# Patient Record
Sex: Female | Born: 1970 | Race: Black or African American | Hispanic: No | Smoking: Never smoker
Health system: Southern US, Community
[De-identification: ages and names within clinical notes are randomized; demographics above are authoritative.]

## PROBLEM LIST (undated history)

## (undated) DIAGNOSIS — R079 Chest pain, unspecified: Secondary | ICD-10-CM

## (undated) DIAGNOSIS — Z1507 Genetic susceptibility to malignant neoplasm of urinary tract: Secondary | ICD-10-CM

## (undated) DIAGNOSIS — M269 Dentofacial anomaly, unspecified: Secondary | ICD-10-CM

## (undated) DIAGNOSIS — Z9889 Other specified postprocedural states: Secondary | ICD-10-CM

## (undated) DIAGNOSIS — Z1509 Genetic susceptibility to other malignant neoplasm: Secondary | ICD-10-CM

## (undated) DIAGNOSIS — K219 Gastro-esophageal reflux disease without esophagitis: Secondary | ICD-10-CM

## (undated) DIAGNOSIS — K8020 Calculus of gallbladder without cholecystitis without obstruction: Secondary | ICD-10-CM

## (undated) DIAGNOSIS — R519 Headache, unspecified: Secondary | ICD-10-CM

## (undated) DIAGNOSIS — K648 Other hemorrhoids: Secondary | ICD-10-CM

## (undated) DIAGNOSIS — D332 Benign neoplasm of brain, unspecified: Secondary | ICD-10-CM

## (undated) DIAGNOSIS — E213 Hyperparathyroidism, unspecified: Secondary | ICD-10-CM

## (undated) DIAGNOSIS — Z5189 Encounter for other specified aftercare: Secondary | ICD-10-CM

## (undated) DIAGNOSIS — Z8489 Family history of other specified conditions: Secondary | ICD-10-CM

## (undated) DIAGNOSIS — E785 Hyperlipidemia, unspecified: Secondary | ICD-10-CM

## (undated) DIAGNOSIS — C801 Malignant (primary) neoplasm, unspecified: Secondary | ICD-10-CM

## (undated) DIAGNOSIS — R112 Nausea with vomiting, unspecified: Secondary | ICD-10-CM

## (undated) HISTORY — DX: Family history of other specified conditions: Z84.89

## (undated) HISTORY — DX: Chest pain, unspecified: R07.9

## (undated) HISTORY — DX: Calculus of gallbladder without cholecystitis without obstruction: K80.20

## (undated) HISTORY — DX: Hyperlipidemia, unspecified: E78.5

## (undated) HISTORY — DX: Encounter for other specified aftercare: Z51.89

## (undated) HISTORY — PX: BRAIN SURGERY: SHX531

## (undated) HISTORY — DX: Other hemorrhoids: K64.8

## (undated) HISTORY — PX: CHOLECYSTECTOMY: SHX55

## (undated) HISTORY — DX: Gastro-esophageal reflux disease without esophagitis: K21.9

---
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
## 1997-07-31 ENCOUNTER — Ambulatory Visit (HOSPITAL_COMMUNITY): Admission: RE | Admit: 1997-07-31 | Discharge: 1997-07-31 | Payer: Self-pay | Admitting: Obstetrics and Gynecology

## 1997-12-15 ENCOUNTER — Other Ambulatory Visit: Admission: RE | Admit: 1997-12-15 | Discharge: 1997-12-15 | Payer: Self-pay | Admitting: Family Medicine

## 1998-03-19 ENCOUNTER — Other Ambulatory Visit: Admission: RE | Admit: 1998-03-19 | Discharge: 1998-03-19 | Payer: Self-pay | Admitting: Family Medicine

## 1998-05-10 ENCOUNTER — Other Ambulatory Visit: Admission: RE | Admit: 1998-05-10 | Discharge: 1998-05-10 | Payer: Self-pay | Admitting: Obstetrics and Gynecology

## 1999-07-15 ENCOUNTER — Other Ambulatory Visit: Admission: RE | Admit: 1999-07-15 | Discharge: 1999-07-15 | Payer: Self-pay | Admitting: Family Medicine

## 2000-06-20 ENCOUNTER — Other Ambulatory Visit: Admission: RE | Admit: 2000-06-20 | Discharge: 2000-06-20 | Payer: Self-pay | Admitting: Family Medicine

## 2001-01-17 ENCOUNTER — Other Ambulatory Visit: Admission: RE | Admit: 2001-01-17 | Discharge: 2001-01-17 | Payer: Self-pay | Admitting: Family Medicine

## 2002-02-05 ENCOUNTER — Other Ambulatory Visit: Admission: RE | Admit: 2002-02-05 | Discharge: 2002-02-05 | Payer: Self-pay | Admitting: Unknown Physician Specialty

## 2002-02-05 ENCOUNTER — Other Ambulatory Visit: Admission: RE | Admit: 2002-02-05 | Discharge: 2002-02-05 | Payer: Self-pay | Admitting: Family Medicine

## 2003-02-24 ENCOUNTER — Other Ambulatory Visit: Admission: RE | Admit: 2003-02-24 | Discharge: 2003-02-24 | Payer: Self-pay | Admitting: Family Medicine

## 2003-02-24 ENCOUNTER — Other Ambulatory Visit: Admission: RE | Admit: 2003-02-24 | Discharge: 2003-02-24 | Payer: Self-pay | Admitting: *Deleted

## 2003-12-22 ENCOUNTER — Encounter: Admission: RE | Admit: 2003-12-22 | Discharge: 2004-03-21 | Payer: Self-pay | Admitting: Family Medicine

## 2004-03-11 ENCOUNTER — Other Ambulatory Visit: Admission: RE | Admit: 2004-03-11 | Discharge: 2004-03-11 | Payer: Self-pay | Admitting: Family Medicine

## 2005-03-14 ENCOUNTER — Other Ambulatory Visit: Admission: RE | Admit: 2005-03-14 | Discharge: 2005-03-14 | Payer: Self-pay | Admitting: Family Medicine

## 2006-03-16 ENCOUNTER — Other Ambulatory Visit: Admission: RE | Admit: 2006-03-16 | Discharge: 2006-03-16 | Payer: Self-pay | Admitting: Family Medicine

## 2006-08-23 ENCOUNTER — Ambulatory Visit: Payer: Self-pay | Admitting: Cardiovascular Disease

## 2006-08-24 ENCOUNTER — Ambulatory Visit: Payer: Self-pay | Admitting: Cardiology

## 2006-09-03 ENCOUNTER — Ambulatory Visit: Payer: Self-pay

## 2006-09-03 LAB — CONVERTED CEMR LAB
Basophils Relative: 0.7 % (ref 0.0–1.0)
HCT: 41.2 % (ref 36.0–46.0)
Hemoglobin: 14.3 g/dL (ref 12.0–15.0)
Lymphocytes Relative: 29.4 % (ref 12.0–46.0)
MCHC: 34.7 g/dL (ref 30.0–36.0)
Monocytes Absolute: 0.2 10*3/uL (ref 0.2–0.7)
Monocytes Relative: 5.2 % (ref 3.0–11.0)
Neutro Abs: 3.1 10*3/uL (ref 1.4–7.7)
Neutrophils Relative %: 63.1 % (ref 43.0–77.0)
RDW: 11.9 % (ref 11.5–14.6)

## 2006-09-18 ENCOUNTER — Ambulatory Visit: Payer: Self-pay | Admitting: Cardiovascular Disease

## 2006-10-22 ENCOUNTER — Ambulatory Visit (HOSPITAL_COMMUNITY): Admission: RE | Admit: 2006-10-22 | Discharge: 2006-10-22 | Payer: Self-pay | Admitting: Family Medicine

## 2012-01-18 ENCOUNTER — Other Ambulatory Visit: Payer: Self-pay | Admitting: Family Medicine

## 2012-01-18 DIAGNOSIS — R319 Hematuria, unspecified: Secondary | ICD-10-CM

## 2012-01-18 DIAGNOSIS — R109 Unspecified abdominal pain: Secondary | ICD-10-CM

## 2012-01-18 DIAGNOSIS — R102 Pelvic and perineal pain: Secondary | ICD-10-CM

## 2012-01-19 ENCOUNTER — Ambulatory Visit (HOSPITAL_COMMUNITY)
Admission: RE | Admit: 2012-01-19 | Discharge: 2012-01-19 | Disposition: A | Discharge status: Home / Self Care | Payer: BC Managed Care – PPO | Source: Ambulatory Visit | Attending: Family Medicine | Admitting: Family Medicine

## 2012-01-19 DIAGNOSIS — R102 Pelvic and perineal pain: Secondary | ICD-10-CM

## 2012-01-19 DIAGNOSIS — R109 Unspecified abdominal pain: Secondary | ICD-10-CM | POA: Insufficient documentation

## 2012-01-19 DIAGNOSIS — N949 Unspecified condition associated with female genital organs and menstrual cycle: Secondary | ICD-10-CM | POA: Insufficient documentation

## 2012-01-19 DIAGNOSIS — R319 Hematuria, unspecified: Secondary | ICD-10-CM | POA: Insufficient documentation

## 2012-08-30 ENCOUNTER — Other Ambulatory Visit: Payer: Self-pay | Admitting: Nurse Practitioner

## 2012-08-30 LAB — COMPLETE METABOLIC PANEL WITH GFR
Albumin: 4.5 g/dL (ref 3.5–5.2)
BUN: 9 mg/dL (ref 6–23)
CO2: 25 mEq/L (ref 19–32)
Calcium: 10.1 mg/dL (ref 8.4–10.5)
Chloride: 104 mEq/L (ref 96–112)
Creat: 0.68 mg/dL (ref 0.50–1.10)
GFR, Est African American: >89 mL/min
GFR, Est Non African American: >89 mL/min
Glucose, Bld: 99 mg/dL (ref 70–99)
Potassium: 4.3 mEq/L (ref 3.5–5.3)

## 2012-08-31 LAB — THYROID PANEL WITH TSH
T3 Uptake: 38.1 % — ABNORMAL HIGH (ref 22.5–37.0)
T4, Total: 7.9 ug/dL (ref 5.0–12.5)

## 2012-09-03 ENCOUNTER — Telehealth: Payer: Self-pay | Admitting: Family Medicine

## 2012-09-03 LAB — NMR LIPOPROFILE WITH LIPIDS
Cholesterol, Total: 176 mg/dL (ref <200)
HDL Particle Number: 25.2 umol/L — ABNORMAL LOW (ref >=30.5)
LDL Particle Number: 1598 nmol/L — ABNORMAL HIGH (ref <1000)
Large HDL-P: <1.3 umol/L — ABNORMAL LOW (ref >=4.8)
Large VLDL-P: 1.2 nmol/L (ref <=2.7)
Triglycerides: 68 mg/dL (ref <150)
VLDL Size: 43.5 nm (ref >=46.6)

## 2012-09-03 LAB — PAP, THIN PREP W/HPV RFLX HPV TYPE 16/18

## 2012-09-03 NOTE — Telephone Encounter (Signed)
Pt aware normal

## 2012-10-02 ENCOUNTER — Telehealth: Payer: Self-pay | Admitting: Nurse Practitioner

## 2012-10-02 NOTE — Telephone Encounter (Signed)
APPT GIVEN FOR THURS WITH MMM

## 2012-10-03 ENCOUNTER — Ambulatory Visit (INDEPENDENT_AMBULATORY_CARE_PROVIDER_SITE_OTHER): Payer: BC Managed Care – PPO | Admitting: Nurse Practitioner

## 2012-10-03 ENCOUNTER — Encounter: Payer: Self-pay | Admitting: Nurse Practitioner

## 2012-10-03 ENCOUNTER — Ambulatory Visit (INDEPENDENT_AMBULATORY_CARE_PROVIDER_SITE_OTHER): Payer: BC Managed Care – PPO

## 2012-10-03 VITALS — BP 126/70 | HR 81 | Temp 97.2°F | Ht 65.0 in | Wt 182.0 lb

## 2012-10-03 DIAGNOSIS — S4991XA Unspecified injury of right shoulder and upper arm, initial encounter: Secondary | ICD-10-CM

## 2012-10-03 DIAGNOSIS — S4980XA Other specified injuries of shoulder and upper arm, unspecified arm, initial encounter: Secondary | ICD-10-CM

## 2012-10-03 MED ORDER — TRIAMCINOLONE ACETONIDE 40 MG/ML IJ SUSP
40.0000 mg | Freq: Once | INTRAMUSCULAR | Status: AC
  Administered 2012-10-03: 40 mg via INTRAMUSCULAR

## 2012-10-03 MED ORDER — BUPIVACAINE HCL 0.5 % IJ SOLN
50.0000 mL | Freq: Once | INTRAMUSCULAR | Status: AC
  Administered 2012-10-03: 50 mL

## 2012-10-03 NOTE — Progress Notes (Signed)
  Subjective:    Patient ID: Laurie Kelley, female    DOB: 1970/08/20, 42 y.o.   MRN: [id number]  HPI-Patient in complaining of pain Right shoulder pain. Started . constant. Rates pain 8/10, worse when laying down. Nothing Helps pain. Over use increases pain. Associated symptoms include nothing     Review of Systems  Constitutional: Negative.   HENT: Negative.   Eyes: Negative.   Respiratory: Negative.   Cardiovascular: Negative.        Objective:   Physical Exam  Constitutional: She appears well-developed and well-nourished.  Cardiovascular: Normal rate, normal heart sounds and intact distal pulses.   Pulmonary/Chest: Effort normal and breath sounds normal.  Musculoskeletal:  Decrease ROM of Right SHoulder, with pain on abduction and internal rotation. Motor strength and sensation intact distally.   Xray- No acute findings  Preliminary reading by Paulene Floor, FNP  [facility] Procedure: Marcaine0.5% 1ml with Kenolog 40mg /ml 1ml injected in right shoulder under sterile technique.      Assessment & Plan:  Right shoulder pain  Rest  Ice if helps  Try to rest  Mary-Margaret Daphine Deutscher, FNP

## 2012-10-03 NOTE — Patient Instructions (Signed)
Shoulder Pain The shoulder is the joint that connects your arms to your body. The bones that form the shoulder joint include the upper arm bone (humerus), the shoulder blade (scapula), and the collarbone (clavicle). The top of the humerus is shaped like a ball and fits into a rather flat socket on the scapula (glenoid cavity). A combination of muscles and strong, fibrous tissues that connect muscles to bones (tendons) support your shoulder joint and hold the ball in the socket. Small, fluid-filled sacs (bursae) are located in different areas of the joint. They act as cushions between the bones and the overlying soft tissues and help reduce friction between the gliding tendons and the bone as you move your arm. Your shoulder joint allows a wide range of motion in your arm. This range of motion allows you to do things like scratch your back or throw a ball. However, this range of motion also makes your shoulder more prone to pain from overuse and injury. Causes of shoulder pain can originate from both injury and overuse and usually can be grouped in the following four categories:  Redness, swelling, and pain (inflammation) of the tendon (tendinitis) or the bursae (bursitis).  Instability, such as a dislocation of the joint.  Inflammation of the joint (arthritis).  Broken bone (fracture). HOME CARE INSTRUCTIONS   Apply ice to the sore area.  Put ice in a plastic bag.  Place a towel between your skin and the bag.  Leave the ice on for 15 to 20 minutes, 3 to 4 times per day for the first 2 days.  If you have a shoulder sling or immobilizer, wear it as long as your caregiver instructs. Only remove it to shower or bathe. Move your arm as little as possible, but keep your hand moving to prevent swelling.  Only take over-the-counter or prescription medicines for pain, discomfort, or fever as directed by your caregiver. SEEK MEDICAL CARE IF:   Your shoulder pain increases, or new pain develops in  your arm, hand, or fingers.  Your hand or fingers become cold and numb.  Your pain is not relieved with medicines. SEEK IMMEDIATE MEDICAL CARE IF:   Your arm, hand, or fingers are numb or tingling.  Your arm, hand, or fingers are significantly swollen or turn white or blue. MAKE SURE YOU:   Understand these instructions.  Will watch your condition.  Will get help right away if you are not doing well or get worse. Document Released: 03/15/2005 Document Revised: 08/28/2011 Document Reviewed: 05/20/2011 Exit[employer] Patient Information 2013 [employer].  

## 2012-11-17 ENCOUNTER — Encounter (HOSPITAL_COMMUNITY): Payer: Self-pay

## 2012-11-17 ENCOUNTER — Emergency Department (HOSPITAL_COMMUNITY)
Admission: EM | Admit: 2012-11-17 | Discharge: 2012-11-17 | Disposition: A | Discharge status: Home / Self Care | Payer: BC Managed Care – PPO | Attending: Emergency Medicine | Admitting: Emergency Medicine

## 2012-11-17 DIAGNOSIS — Y9289 Other specified places as the place of occurrence of the external cause: Secondary | ICD-10-CM | POA: Insufficient documentation

## 2012-11-17 DIAGNOSIS — T17208A Unspecified foreign body in pharynx causing other injury, initial encounter: Secondary | ICD-10-CM

## 2012-11-17 DIAGNOSIS — T189XXA Foreign body of alimentary tract, part unspecified, initial encounter: Secondary | ICD-10-CM | POA: Insufficient documentation

## 2012-11-17 DIAGNOSIS — K209 Esophagitis, unspecified without bleeding: Secondary | ICD-10-CM | POA: Insufficient documentation

## 2012-11-17 DIAGNOSIS — IMO0002 Reserved for concepts with insufficient information to code with codable children: Secondary | ICD-10-CM | POA: Insufficient documentation

## 2012-11-17 DIAGNOSIS — Y9389 Activity, other specified: Secondary | ICD-10-CM | POA: Insufficient documentation

## 2012-11-17 DIAGNOSIS — R1310 Dysphagia, unspecified: Secondary | ICD-10-CM | POA: Insufficient documentation

## 2012-11-17 MED ORDER — SUCRALFATE 1 GM/10ML PO SUSP: 1.0000 g | Freq: Four times a day (QID) | ORAL | Status: DC

## 2012-11-17 MED ORDER — GI COCKTAIL ~~LOC~~
30.0000 mL | Freq: Once | ORAL | Status: AC
  Administered 2012-11-17: 30 mL via ORAL
  Filled 2012-11-17: qty 30

## 2012-11-17 NOTE — ED Notes (Signed)
Report received from TO RN 

## 2012-11-17 NOTE — ED Provider Notes (Signed)
History  This chart was scribed for Ward Givens, MD by Bennett Scrape, ED Scribe. This patient was seen in room APA03/APA03 and the patient's care was started at 5:48 PM.  CSN: [id number]  Arrival date & time 11/17/12  1715   First MD Initiated Contact with Patient 11/17/12 1748      Chief Complaint  Patient presents with  . foreign body in throat      The history is provided by the patient. No language interpreter was used.    HPI Comments: Laurie L Forester is a 42 y.o. female who presents to the Emergency Department complaining of a foreign body described as one unisom gel capsule stuck in her throat on the right side. She reports that she felt like the pill got got in a "pocket" when she swallowed it two days ago and has since moved further down.  She admits that she has tried self-induced vomiting with no improvement in the symptoms. She denies having difficulty swallowing food or fluids but states that she feels a significant amount of pressure in her throat constantly. She denies having prior trouble swallowing pills and admits that she has swallowed unisom pills before without difficulty. She denies SOB, CP and nausea as associated symptoms. Pt does not have a h/o chronic medical conditions and denies smoking and alcohol use.  PCP is NP Paulene Floor at [facility].   History reviewed. No pertinent past medical history.  History reviewed. No pertinent past surgical history.  Family History  Problem Relation Age of Onset  . Heart disease Father     History  Substance Use Topics  . Smoking status: Never Smoker   . Smokeless tobacco: Not on file  . Alcohol Use: No  works with [employer]  No OB history provided.  Review of Systems  All other systems reviewed and are negative.    Allergies  Review of patient's allergies indicates no known allergies.  Home Medications   Current Outpatient Rx  Name  Route  Sig  Dispense  Refill  .  medroxyPROGESTERone (DEPO-PROVERA) 150 MG/ML injection                 Triage Vitals: BP 150/75  Pulse 92  Temp(Src) 97.5 F (36.4 C) (Oral)  Resp 18  Ht 5\' 5"  (1.651 m)  Wt 183 lb (83.008 kg)  BMI 30.45 kg/m2  SpO2 100%  Vital signs normal    Physical Exam  Nursing note and vitals reviewed. Constitutional: She is oriented to person, place, and time. She appears well-developed and well-nourished.  Non-toxic appearance. She does not appear ill. No distress.  HENT:  Head: Normocephalic and atraumatic.  Right Ear: External ear normal.  Left Ear: External ear normal.  Nose: Nose normal. No mucosal edema or rhinorrhea.  Mouth/Throat: Oropharynx is clear and moist and mucous membranes are normal. No dental abscesses or edematous.  Eyes: Conjunctivae and EOM are normal. Pupils are equal, round, and reactive to light.  Neck: Normal range of motion and full passive range of motion without pain. Neck supple.    Patient indicates she initially felt like was stuck and shows on her right side of her neck, she relates now she's having pain in the anterior portion of her neck as noted in the drawing. Patient has no drooling, no shortness of breath, no difficulty swallowing. Her voice is normal  Pulmonary/Chest: Effort normal and breath sounds normal. No respiratory distress. She has no rhonchi. She exhibits no crepitus.  Abdominal:  Normal appearance.  Musculoskeletal: Normal range of motion. She exhibits no edema and no tenderness.  Moves all extremities well.   Neurological: She is alert and oriented to person, place, and time. She has normal strength. No cranial nerve deficit.  Skin: Skin is warm, dry and intact. No rash noted. No erythema. No pallor.  Psychiatric: She has a normal mood and affect. Her speech is normal and behavior is normal. Her mood appears not anxious.    ED Course  Procedures (including critical care time)  Medications  gi cocktail (Maalox,Lidocaine,Donnatal)  (30 mLs Oral Given 11/17/12 1901)    DIAGNOSTIC STUDIES: Oxygen Saturation is 100% on room air, normal by my interpretation.    COORDINATION OF CARE: 6:31 PM-Advised pt that the capsule is designed to dissolve but that her symptoms could be tissue irritation from where the pill became stuck. Discussed treatment plan which includes medications with pt at bedside and pt agreed to plan. Will have pt f/u with ENT if symptoms persist.  We discussed followup with the ears nose and throat doctor. She reports her son sees a Dr. Andrey Campanile in [place], she can follow up with Dr. Andrey Campanile or Dr. Seth Bake. She reports the GI cocktail did help her discomfort.   1. Foreign body in throat, initial encounter   2. Pill esophagitis       Discharge Medication List as of 11/17/2012  7:33 PM    START taking these medications   Details  sucralfate (CARAFATE) 1 GM/10ML suspension Take 10 mLs (1 g total) by mouth 4 (four) times daily., Starting 11/17/2012, Until Discontinued, Print        Plan discharge  Devoria Albe, MD, FACEP   MDM     I personally performed the services described in this documentation, which was scribed in my presence. The recorded information has been reviewed and considered.  Devoria Albe, MD, Armando Gang    Ward Givens, MD 11/18/12 [phone]

## 2012-11-17 NOTE — ED Notes (Signed)
Pt reports took a unisom sleep aid pill Friday and it has been stuck in throat since.  Pt says it feel like it is in a "pocket" in her throat.  Pt able to eat and swallow without difficulty but says is painful.

## 2012-11-17 NOTE — ED Notes (Signed)
Pt alert & oriented x4, stable gait. Patient given discharge instructions, paperwork & prescription(s). Patient  instructed to stop at the registration desk to finish any additional paperwork. Patient verbalized understanding. Pt left department w/ no further questions. 

## 2012-12-02 ENCOUNTER — Ambulatory Visit (INDEPENDENT_AMBULATORY_CARE_PROVIDER_SITE_OTHER): Payer: BC Managed Care – PPO | Admitting: *Deleted

## 2012-12-02 DIAGNOSIS — Z309 Encounter for contraceptive management, unspecified: Secondary | ICD-10-CM

## 2012-12-02 DIAGNOSIS — IMO0001 Reserved for inherently not codable concepts without codable children: Secondary | ICD-10-CM

## 2012-12-02 MED ORDER — MEDROXYPROGESTERONE ACETATE 150 MG/ML IM SUSP
150.0000 mg | INTRAMUSCULAR | Status: AC
  Administered 2012-12-02 – 2013-06-02 (×3): 150 mg via INTRAMUSCULAR

## 2012-12-02 NOTE — Patient Instructions (Signed)

## 2012-12-02 NOTE — Progress Notes (Signed)
Patient tolerated well.

## 2013-03-05 ENCOUNTER — Ambulatory Visit (INDEPENDENT_AMBULATORY_CARE_PROVIDER_SITE_OTHER): Payer: BC Managed Care – PPO | Admitting: *Deleted

## 2013-03-05 DIAGNOSIS — IMO0001 Reserved for inherently not codable concepts without codable children: Secondary | ICD-10-CM

## 2013-03-05 DIAGNOSIS — Z309 Encounter for contraceptive management, unspecified: Secondary | ICD-10-CM

## 2013-06-02 ENCOUNTER — Ambulatory Visit (INDEPENDENT_AMBULATORY_CARE_PROVIDER_SITE_OTHER): Payer: BC Managed Care – PPO | Admitting: *Deleted

## 2013-06-02 DIAGNOSIS — Z309 Encounter for contraceptive management, unspecified: Secondary | ICD-10-CM

## 2013-06-02 DIAGNOSIS — IMO0001 Reserved for inherently not codable concepts without codable children: Secondary | ICD-10-CM

## 2013-06-02 NOTE — Progress Notes (Signed)
Patient ID: Laurie Kelley, female   DOB: [birth year], 42 y.o.   MRN: [id number] p tolerated inj well

## 2013-08-20 ENCOUNTER — Other Ambulatory Visit: Payer: Self-pay | Admitting: Nurse Practitioner

## 2013-08-21 NOTE — Telephone Encounter (Signed)
Last seen 10/03/12  MMM

## 2013-08-29 ENCOUNTER — Encounter (INDEPENDENT_AMBULATORY_CARE_PROVIDER_SITE_OTHER): Payer: Self-pay

## 2013-08-29 ENCOUNTER — Ambulatory Visit (INDEPENDENT_AMBULATORY_CARE_PROVIDER_SITE_OTHER): Payer: BC Managed Care – PPO | Admitting: *Deleted

## 2013-08-29 DIAGNOSIS — Z309 Encounter for contraceptive management, unspecified: Secondary | ICD-10-CM

## 2013-08-29 DIAGNOSIS — IMO0001 Reserved for inherently not codable concepts without codable children: Secondary | ICD-10-CM

## 2013-08-29 MED ORDER — MEDROXYPROGESTERONE ACETATE 150 MG/ML IM SUSP
150.0000 mg | INTRAMUSCULAR | Status: AC
  Administered 2013-08-29 – 2014-06-02 (×4): 150 mg via INTRAMUSCULAR

## 2013-08-29 NOTE — Progress Notes (Signed)
DEPOPROVERA GIVEN AND TOLERATED WELL

## 2013-08-29 NOTE — Patient Instructions (Signed)

## 2013-11-18 ENCOUNTER — Ambulatory Visit (INDEPENDENT_AMBULATORY_CARE_PROVIDER_SITE_OTHER): Payer: BC Managed Care – PPO | Admitting: Nurse Practitioner

## 2013-11-18 ENCOUNTER — Telehealth: Payer: Self-pay | Admitting: Nurse Practitioner

## 2013-11-18 ENCOUNTER — Encounter: Payer: Self-pay | Admitting: Nurse Practitioner

## 2013-11-18 ENCOUNTER — Encounter (INDEPENDENT_AMBULATORY_CARE_PROVIDER_SITE_OTHER): Payer: Self-pay

## 2013-11-18 VITALS — BP 143/77 | HR 91 | Temp 98.2°F | Ht 65.0 in | Wt 185.8 lb

## 2013-11-18 DIAGNOSIS — R51 Headache: Secondary | ICD-10-CM

## 2013-11-18 DIAGNOSIS — Z8489 Family history of other specified conditions: Secondary | ICD-10-CM

## 2013-11-18 NOTE — Progress Notes (Signed)
   Subjective:    Patient ID: Laurie Kelley, female    DOB: 1971/05/27, 43 y.o.   MRN: [id number]  HPI Patient in today c/o pain in back of head- started about 1 month ago- intermittent but has been constant for the last several days- currently rates pain 6/10-nothing seems to help- nothing makes it worse. Wakes her up out of sleepat times- Has a strong family history of brain tumors.    Review of Systems  Constitutional: Positive for fatigue.  HENT: Negative.   Eyes: Positive for visual disturbance (intermitent).  Respiratory: Negative.   Cardiovascular: Negative.   Genitourinary: Negative.   Neurological: Positive for dizziness. Negative for syncope, facial asymmetry, weakness, light-headedness and headaches.  Psychiatric/Behavioral: Negative.   All other systems reviewed and are negative. BP 143/77  Pulse 91  Temp(Src) 98.2 F (36.8 C) (Oral)  Ht 5\' 5"  (1.651 m)  Wt 185 lb 12.8 oz (84.278 kg)  BMI 30.92 kg/m2      Objective:   Physical Exam  Constitutional: She is oriented to person, place, and time. She appears well-developed and well-nourished.  Cardiovascular: Normal rate, regular rhythm and normal heart sounds.   Musculoskeletal:  FROM of neck- no pain Point tenderness on right side of scalp.  Neurological: She is alert and oriented to person, place, and time. She has normal reflexes. No cranial nerve deficit.  Skin: Skin is warm and dry.  Psychiatric: She has a normal mood and affect. Her behavior is normal. Judgment and thought content normal.   BP 143/77  Pulse 91  Temp(Src) 98.2 F (36.8 C) (Oral)  Ht 5\' 5"  (1.651 m)  Wt 185 lb 12.8 oz (84.278 kg)  BMI 30.92 kg/m2        Assessment & Plan:   1. Headache(784.0)   2. Family history of brain tumor    Orders Placed This Encounter  Procedures  . CT Head W Contrast    Standing Status: Future     Number of Occurrences:      Standing Expiration Date: 02/19/2015    Order Specific Question:  Reason for  Exam (SYMPTOM  OR DIAGNOSIS REQUIRED)    Answer:  screening    Order Specific Question:  Is the patient pregnant?    Answer:  No    Order Specific Question:  Preferred imaging location?    Answer:  [facility]   Keep diary of headaches  Mary-Margaret Hassell Done, [place]

## 2013-11-18 NOTE — Telephone Encounter (Signed)
appt given for today with mmm 

## 2013-11-25 ENCOUNTER — Encounter (HOSPITAL_COMMUNITY): Payer: Self-pay

## 2013-11-25 ENCOUNTER — Ambulatory Visit (HOSPITAL_COMMUNITY)
Admission: RE | Admit: 2013-11-25 | Discharge: 2013-11-25 | Disposition: A | Discharge status: Home / Self Care | Payer: BC Managed Care – PPO | Source: Ambulatory Visit | Attending: Nurse Practitioner | Admitting: Nurse Practitioner

## 2013-11-25 DIAGNOSIS — R51 Headache: Secondary | ICD-10-CM | POA: Insufficient documentation

## 2013-11-25 DIAGNOSIS — Z8489 Family history of other specified conditions: Secondary | ICD-10-CM

## 2013-12-01 ENCOUNTER — Ambulatory Visit (INDEPENDENT_AMBULATORY_CARE_PROVIDER_SITE_OTHER): Payer: BC Managed Care – PPO | Admitting: *Deleted

## 2013-12-01 DIAGNOSIS — Z309 Encounter for contraceptive management, unspecified: Secondary | ICD-10-CM

## 2013-12-01 DIAGNOSIS — IMO0001 Reserved for inherently not codable concepts without codable children: Secondary | ICD-10-CM

## 2013-12-01 NOTE — Progress Notes (Signed)
Patient ID: Laurie Kelley, female   DOB: 1970/09/01, 43 y.o.   MRN: [id number] Pt tolerated inj well

## 2014-03-04 ENCOUNTER — Ambulatory Visit: Payer: BC Managed Care – PPO

## 2014-03-04 ENCOUNTER — Encounter: Payer: Self-pay | Admitting: Nurse Practitioner

## 2014-03-04 ENCOUNTER — Ambulatory Visit (INDEPENDENT_AMBULATORY_CARE_PROVIDER_SITE_OTHER): Payer: BC Managed Care – PPO | Admitting: Nurse Practitioner

## 2014-03-04 ENCOUNTER — Ambulatory Visit (INDEPENDENT_AMBULATORY_CARE_PROVIDER_SITE_OTHER): Payer: BC Managed Care – PPO

## 2014-03-04 VITALS — BP 142/87 | HR 101 | Temp 98.6°F | Ht 65.0 in | Wt 192.6 lb

## 2014-03-04 DIAGNOSIS — R109 Unspecified abdominal pain: Secondary | ICD-10-CM

## 2014-03-04 DIAGNOSIS — K5900 Constipation, unspecified: Secondary | ICD-10-CM

## 2014-03-04 DIAGNOSIS — Z309 Encounter for contraceptive management, unspecified: Secondary | ICD-10-CM

## 2014-03-04 LAB — POCT URINALYSIS DIPSTICK
BILIRUBIN UA: NEGATIVE
Glucose, UA: NEGATIVE
KETONES UA: NEGATIVE
LEUKOCYTES UA: NEGATIVE
Nitrite, UA: NEGATIVE
Protein, UA: NEGATIVE
Spec Grav, UA: 1.025
Urobilinogen, UA: NEGATIVE
pH, UA: 6

## 2014-03-04 LAB — POCT UA - MICROSCOPIC ONLY
Casts, Ur, LPF, POC: NEGATIVE
Crystals, Ur, HPF, POC: NEGATIVE
Mucus, UA: NEGATIVE
Yeast, UA: NEGATIVE

## 2014-03-04 NOTE — Patient Instructions (Signed)

## 2014-03-04 NOTE — Progress Notes (Signed)
   Subjective:    Patient ID: Laurie Kelley, female    DOB: Nov 28, 1970, 43 y.o.   MRN: [id number]  HPI Patient in c/o of a soreness all the way across her lower abdomen. She says that it started Wednesday of last week- Denisis any injury. Says that nothing has changed in her diet or routine.    Review of Systems  Constitutional: Negative.   HENT: Negative.   Respiratory: Negative.   Cardiovascular: Negative.   Gastrointestinal: Negative for abdominal pain and constipation.  Genitourinary: Negative for dysuria, urgency and frequency.  Neurological: Negative.   Hematological: Negative.   Psychiatric/Behavioral: Negative.   All other systems reviewed and are negative.      Objective:   Physical Exam  Constitutional: She is oriented to person, place, and time. She appears well-developed and well-nourished.  Cardiovascular: Normal rate, regular rhythm and normal heart sounds.   Pulmonary/Chest: Effort normal and breath sounds normal.  Musculoskeletal: Normal range of motion.  Neurological: She is alert and oriented to person, place, and time.  Skin: Skin is warm and dry.  Psychiatric: She has a normal mood and affect. Her behavior is normal. Judgment and thought content normal.   BP 142/87  Pulse 101  Temp(Src) 98.6 F (37 C) (Oral)  Ht 5\' 5"  (1.651 m)  Wt 192 lb 9.6 oz (87.363 kg)  BMI 32.05 kg/m2  KUB- moderate amount of stool present i Throughout colon-Preliminary reading by Ronnald Collum, FNP  [facility]  Results for orders placed in visit on 03/04/14  POCT UA - MICROSCOPIC ONLY      Result Value Ref Range   WBC, Ur, HPF, POC 1-5     RBC, urine, microscopic 1-3     Bacteria, U Microscopic few     Mucus, UA negative     Epithelial cells, urine per micros few     Crystals, Ur, HPF, POC negative     Casts, Ur, LPF, POC negative     Yeast, UA negative    POCT URINALYSIS DIPSTICK      Result Value Ref Range   Color, UA gold     Clarity, UA clear     Glucose, UA negative      Bilirubin, UA negative     Ketones, UA negative     Spec Grav, UA 1.025     Blood, UA trace     pH, UA 6.0     Protein, UA negative     Urobilinogen, UA negative     Nitrite, UA negative     Leukocytes, UA Negative            Assessment & Plan:   1. Abdominal discomfort   2. Constipation, unspecified constipation type    Force fluids Increase fiber in diet Magnesium citrate 1/2 bottle miralax daily  Mary-Margaret Hassell Done, FNP

## 2014-03-10 ENCOUNTER — Telehealth: Payer: Self-pay

## 2014-03-10 NOTE — Telephone Encounter (Signed)
LM with results of KUB on home answering machine as per DPR of 09/2012; Gave her a callback number of [phone] if any questions/concerns

## 2014-03-15 ENCOUNTER — Emergency Department (HOSPITAL_COMMUNITY)
Admission: EM | Admit: 2014-03-15 | Discharge: 2014-03-15 | Disposition: A | Discharge status: Home / Self Care | Payer: BC Managed Care – PPO | Attending: Emergency Medicine | Admitting: Emergency Medicine

## 2014-03-15 ENCOUNTER — Encounter (HOSPITAL_COMMUNITY): Payer: Self-pay | Admitting: Emergency Medicine

## 2014-03-15 ENCOUNTER — Emergency Department (HOSPITAL_COMMUNITY): Payer: BC Managed Care – PPO

## 2014-03-15 DIAGNOSIS — S99929A Unspecified injury of unspecified foot, initial encounter: Principal | ICD-10-CM

## 2014-03-15 DIAGNOSIS — S8990XA Unspecified injury of unspecified lower leg, initial encounter: Secondary | ICD-10-CM | POA: Diagnosis not present

## 2014-03-15 DIAGNOSIS — Y9289 Other specified places as the place of occurrence of the external cause: Secondary | ICD-10-CM | POA: Insufficient documentation

## 2014-03-15 DIAGNOSIS — W2209XA Striking against other stationary object, initial encounter: Secondary | ICD-10-CM | POA: Insufficient documentation

## 2014-03-15 DIAGNOSIS — Z79899 Other long term (current) drug therapy: Secondary | ICD-10-CM | POA: Diagnosis not present

## 2014-03-15 DIAGNOSIS — Y9389 Activity, other specified: Secondary | ICD-10-CM | POA: Insufficient documentation

## 2014-03-15 DIAGNOSIS — S99919A Unspecified injury of unspecified ankle, initial encounter: Secondary | ICD-10-CM | POA: Diagnosis present

## 2014-03-15 DIAGNOSIS — M25562 Pain in left knee: Secondary | ICD-10-CM

## 2014-03-15 MED ORDER — NAPROXEN 250 MG PO TABS: 250.0000 mg | ORAL_TABLET | Freq: Two times a day (BID) | ORAL | Status: DC | PRN

## 2014-03-15 MED ORDER — HYDROCODONE-ACETAMINOPHEN 5-325 MG PO TABS: ORAL_TABLET | ORAL | Status: DC

## 2014-03-15 MED ORDER — IBUPROFEN 400 MG PO TABS
400.0000 mg | ORAL_TABLET | Freq: Once | ORAL | Status: AC
  Administered 2014-03-15: 400 mg via ORAL
  Filled 2014-03-15: qty 1

## 2014-03-15 NOTE — ED Notes (Signed)
Crutches fitted for pt and walking with crutches demonstrated. Pt refused return demonstration.

## 2014-03-15 NOTE — ED Notes (Signed)
Onset yesterday, pt was picking up grandson, and left leg hit bed rail, pt then fell,  Complain of left knee pain

## 2014-03-15 NOTE — ED Notes (Signed)
Patient states she did not want to use the crutches. Patient states. "leave them in the plastic my brother will carry them out.

## 2014-03-15 NOTE — ED Provider Notes (Signed)
CSN: [id number]     Arrival date & time 03/15/14  1221 History   First MD Initiated Contact with Patient 03/15/14 1443     Chief Complaint  Patient presents with  . Knee Pain      HPI Pt was seen at 1450. Per pt, c/o gradual onset and persistence of constant left lateral knee "pain" since yesterday. Pt states she was picking up her grandson and hit the side of her left knee and lower leg against the bed rail. Describes the pain as "aching." States her pain worsens when she walks. Denies focal motor weakness, no tingling/numbness in extremities, no fevers, no rash, no back pain, no abd pain.    History reviewed. No pertinent past medical history.  Past Surgical History  Procedure Laterality Date  . Cholecystectomy     Family History  Problem Relation Age of Onset  . Heart disease Father    History  Substance Use Topics  . Smoking status: Never Smoker   . Smokeless tobacco: Not on file  . Alcohol Use: No    Review of Systems ROS: Statement: All systems negative except as marked or noted in the HPI; Constitutional: Negative for fever and chills. ; ; Eyes: Negative for eye pain, redness and discharge. ; ; ENMT: Negative for ear pain, hoarseness, nasal congestion, sinus pressure and sore throat. ; ; Cardiovascular: Negative for chest pain, palpitations, diaphoresis, dyspnea and peripheral edema. ; ; Respiratory: Negative for cough, wheezing and stridor. ; ; Gastrointestinal: Negative for nausea, vomiting, diarrhea, abdominal pain, blood in stool, hematemesis, jaundice and rectal bleeding. . ; ; Genitourinary: Negative for dysuria, flank pain and hematuria. ; ; Musculoskeletal: +knee pain. Negative for back pain and neck pain. Negative for swelling and deformity.; ; Skin: Negative for pruritus, rash, abrasions, blisters, bruising and skin lesion.; ; Neuro: Negative for headache, lightheadedness and neck stiffness. Negative for weakness, altered level of consciousness , altered mental status,  extremity weakness, paresthesias, involuntary movement, seizure and syncope.      Allergies  Review of patient's allergies indicates no known allergies.  Home Medications   Prior to Admission medications   Medication Sig Start Date End Date Taking? Authorizing Provider  medroxyPROGESTERone (DEPO-PROVERA) 150 MG/ML injection INJECT 150MG  INTRAMUSCULARLY EVERY 3 MONTHS AS INSTRUCTED    Mary-Margaret Hassell Done, FNP  sucralfate (CARAFATE) 1 GM/10ML suspension Take 10 mLs (1 g total) by mouth 4 (four) times daily. 11/17/12   Janice Norrie, MD   BP 134/86  Pulse 95  Temp(Src) 98 F (36.7 C) (Oral)  Resp 20  Ht 5\' 5"  (1.651 m)  Wt 190 lb (86.183 kg)  BMI 31.62 kg/m2  SpO2 99% Physical Exam 1455; Physical examination:  Nursing notes reviewed; Vital signs and O2 SAT reviewed;  Constitutional: Well developed, Well nourished, Well hydrated, In no acute distress; Head:  Normocephalic, atraumatic; Eyes: EOMI, PERRL, No scleral icterus; ENMT: Mouth and pharynx normal, Mucous membranes moist; Neck: Supple, Full range of motion; Cardiovascular: Regular rate and rhythm, No gallop; Respiratory: Breath sounds clear & equal bilaterally, No wheezes.  Speaking full sentences with ease, Normal respiratory effort/excursion; Chest: Nontender, Movement normal; Abdomen: Soft, Nondistended;; Extremities: Pulses normal, No deformity. +FROM left knee, including able to lift extended LLE off stretcher, and extend left lower leg against resistance.  No ligamentous laxity.  No patellar or quad tendon step-offs.  NMS intact left foot, strong pedal pp. +plantarflexion of left foot w/calf squeeze.  No palpable gap left Achilles's tendon.  No proximal fibular head  tenderness.  No edema, erythema, warmth, ecchymosis or deformity.  No specific area of point tenderness. No calf edema or asymmetry.; Neuro: AA&Ox3, Major CN grossly intact.  Speech clear. No gross focal motor or sensory deficits in extremities.; Skin: Color normal, Warm,  Dry.   ED Course  Procedures     MDM  MDM Reviewed: previous chart, nursing note and vitals Interpretation: x-ray   Dg Knee Complete 4 Views Left 03/15/2014   CLINICAL DATA:  Left knee pain after injury.  EXAM: LEFT KNEE - COMPLETE 4+ VIEW  COMPARISON:  None.  FINDINGS: There is no evidence of fracture, dislocation, or joint effusion. There is no evidence of arthropathy or other focal bone abnormality. Soft tissues are unremarkable.  IMPRESSION: Normal left knee.   Electronically Signed   By: Sabino Dick M.D.   On: 03/15/2014 14:21    1505:  Tx symptomatically at this time; f/u Ortho MD. Dx and testing d/w pt and family.  Questions answered.  Verb understanding, agreeable to d/c home with outpt f/u.   Francine Graven, DO 03/18/14 [phone]

## 2014-03-15 NOTE — Discharge Instructions (Signed)
°Emergency Department Resource Guide °1) Find a Doctor and Pay Out of Pocket °Although you won't have to find out who is covered by your insurance plan, it is a good idea to ask around and get recommendations. You will then need to call the office and see if the doctor you have chosen will accept you as a new patient and what types of options they offer for patients who are self-pay. Some doctors offer discounts or will set up payment plans for their patients who do not have insurance, but you will need to ask so you aren't surprised when you get to your appointment. ° °2) Contact Your Local Health Department °Not all health departments have doctors that can see patients for sick visits, but many do, so it is worth a call to see if yours does. If you don't know where your local health department is, you can check in your phone book. The CDC also has a tool to help you locate your state's health department, and many state websites also have listings of all of their local health departments. ° °3) Find a Walk-in Clinic °If your illness is not likely to be very severe or complicated, you may want to try a walk in clinic. These are popping up all over the country in pharmacies, drugstores, and shopping centers. They're usually staffed by nurse practitioners or physician assistants that have been trained to treat common illnesses and complaints. They're usually fairly quick and inexpensive. However, if you have serious medical issues or chronic medical problems, these are probably not your best option. ° °No Primary Care Doctor: °- Call [facility] at  832-8000 - they can help you locate a primary care doctor that  accepts your insurance, provides certain services, etc. °- Physician Referral Service- 1-800-533-3463 ° °Chronic Pain Problems: °Organization         Address  Phone   Notes  °[facility]  (336) 297-2271 Patients need to be referred by their primary care doctor.  ° °Medication  Assistance: °Organization         Address  Phone   Notes  °[place] [facility] [address] °[place], [place] [address] (336) 641-8030 --Must be a resident of [place] °-- Must have NO insurance coverage whatsoever (no Medicaid/ Medicare, etc.) °-- The pt. MUST have a primary care doctor that directs their care regularly and follows them in the community °  °[facility]  (866) 331-1348   °[employer]  (888) 892-1162   ° °Agencies that provide inexpensive medical care: °Organization         Address  Phone   Notes  °[facility]  (336) 832-8035   °[facility]    (336) 832-7272   °[facility] [address] °[place], [place] [address] (336) 832-4777   °[facility] [address], °[place] (336) 271-4999   °[facility]    (336) 373-0678   °[facility]    (336) 272-1050   °[facility] ° [address], [place] Phone:  (336) 832-4444, Fax:  (336) 832-4440 Hours of Operation:  9 am - 6 pm, M-F.  Also accepts Medicaid/Medicare and self-pay.  °[facility] ° [address], [place] Phone: (336) 832-3150, Fax: (336) [phone]. Hours of Operation:  8:30 am - 5:30 pm, M-F.  Also accepts Medicaid and self-pay.  °[facility] [place] [address], [place] Phone: (336) 878-6027   °[facility] [address], [place], [place] (336)723-1848, Ext. 123 Mondays & Thursdays: 7-9 AM.  First 15 patients are seen on a first come, first serve basis. °  ° °Medicaid-accepting [place] Providers: ° °Organization         Address  Phone   Notes  °[facility] [address], [place] (336) 641-2100 Also accepts self-pay patients.  °[facility] [address], [place] ° (336) 856-9996   °[facility] [address], [place]  (336) 288-8857   °[facility] [address] [place] [address], [place] (336) 299-7000   °Veita Bland [address], [place]  ° (336) 373-1557 Only accepts [place] Access Medicaid patients after they have their name applied to their card.  ° °Self-Pay (no insurance) in [place]: ° °Organization         Address  Phone   Notes  °Sickle Cell Patients, [facility] [address], [place] (336) 832-1970   °[facility] [address], [place] (336) 832-4400   °[facility] ° [address],  (336) 992-4800   °[facility]/Dr. Osei-Bonsu ° [address] [place] [address], [place] or [address], [place] (336) [phone] Phone number for both [place] and [place] locations is the same.  °[facility] [address], [place] (336) 299-0000   °[facility] [address] [place] [address], [place] or [address] (336) 852-7530 °(336) 878-2260   °[facility] [address], [place] (336) 350-1642, phone; (336) 294-5005, fax Sees patients 1st and 3rd Saturday of every month.  Must not qualify for public or private insurance (i.e. Medicaid, Medicare, [place] Health Choice, Veterans' Benefits) • Household income should be no more than 200% of the poverty level •The clinic cannot treat you if you are pregnant or think you are pregnant • Sexually transmitted diseases are not treated at the clinic.  ° ° °Dental Care: °Organization         Address  Phone  Notes  °[place] [facility] [address],  (336) 641-6152 Accepts children up to age 21 who are enrolled in Medicaid or [place] Health Choice; pregnant women with a Medicaid card; and children who have applied for Medicaid or [place] Health Choice, but were declined, whose parents can pay a reduced fee at time of service.  °[place]  [facility] [place]  [address], [place] (336) 641-7733 Accepts children up to age 21 who are enrolled in Medicaid or [place] Health Choice; pregnant women with a Medicaid card; and children who have applied for Medicaid or [place] Health Choice, but were declined, whose parents can pay a reduced fee at time of service.  °[facility] ° [address],  (336) 641-4533 Patients are seen by appointment only. Walk-ins are not accepted. [facility] will see patients 18 years of age and older. °Monday - Tuesday (8am-5pm) °Most Wednesdays (8:30-5pm) °$30 per visit, cash only  °[facility] ° [address], [place] (336) 641-4533 Patients are seen by appointment only. Walk-ins are not accepted. [facility] will see patients 18 years of age and older. °One   Wednesday Evening (Monthly: Volunteer Based).  $30 per visit, cash only  °[facility]  (919) [phone] for adults; Children under age 4, call Graduate Pediatric Dentistry at ([phone]) [phone]. Children aged 4-14, please call (919) [phone] to request a pediatric application. ° Dental services are provided in all areas of dental care including fillings, crowns and bridges, complete and partial dentures, implants, gum treatment, root canals, and extractions. Preventive care is also provided. Treatment is provided to both adults and children. °Patients are selected via a lottery and there is often a waiting list. °  °[facility] [address], °[place] ° (336) 763-8833 www.drcivils.com °  °[facility] [address], [place], [place] (336)723-1848, Ext. 123 Second and Fourth Thursday of each month, opens at 6:30 AM; Clinic ends at 9 AM.  Patients are seen on a first-come first-served basis, and a limited number are seen during each clinic.  ° °[facility] ° [address], [place], [place] (336) 723-7904    Eligibility Requirements °You must have lived in [place], [place], or [place] counties for at least the last three months. °  You cannot be eligible for state or federal sponsored healthcare insurance, including Veterans Administration, Medicaid, or Medicare. °  You generally cannot be eligible for healthcare insurance through your employer.  °  How to apply: °Eligibility screenings are held every Tuesday and Wednesday afternoon from 1:00 pm until 4:00 pm. You do not need an appointment for the interview!  °[facility] [address], [place], [place] 336-631-2330   °[facility]  336-342-8273   °[facility]  336-703-3100   °[facility]  336-570-6415   ° °Behavioral Health Resources in the Community: °Intensive Outpatient Programs °Organization         Address  Phone  Notes  °[facility] [address], [place], [place] 336-878-6098   °[facility] [address], [place], [place] 336-832-9800   °[facility] [address], [place], [place] ° 336-882-2125   °[facility] [address],  °[place], [place] 1-800-853-5163 or 336-641-4981   °Substance Abuse Resources °Organization         Address  Phone  Notes  °[facility]  336-882-2125   °[facility]  336-784-9470   °[facility]  336-285-9073   °[facility]  336-845-3988   °Residential & Outpatient Substance Abuse Program  1-800-659-3381   °Psychological Services °Organization         Address  Phone  Notes  °[facility]  336- 832-9600   °[facility]  336- 378-7881   °[facility] [address], [place] 1-800-853-5163 or 336-641-4981   ° °Mobile Crisis Teams °Organization         Address  Phone  Notes  °[facility], Mobile Crisis Care Unit  1-877-626-1772   °Assertive °[facility] ° [address]  [place], [place] 336-834-9664   °Sharon DeEsch [address] °[place] 336-554-5454   ° °Self-Help/Support Groups °Organization         Address  Phone             Notes  °[facility] [place] - variety of support groups  336- 373-1402 Call for more information  °Narcotics Anonymous (NA), [facility] [address], °[place]  2 meetings at this location  ° °  Residential Treatment Programs Organization         Address  Phone  Notes  [facility] [address],    [place]  1-223-595-2310   [facility]  [address] [place] [address], [place], [place]   [place], [place] 760-708-2741 Admissions: 8am-3pm M-F  [facility] [place] [address],    [place], [place] [phone]   [facility] [address] [place], [place], [place]   [facility] [address],  [place], [place]   [facility] Intensive Outpatient [address], [place], [place]   [facility] [place]  [place], [place] 1-207-214-8677 or (314)400-4098   [facility] [address] [place], [place] Accepts Medicaid  [facility] [address]  [place] 1-(970) 783-3046 Substance Abuse/Addiction Treatment   [facility]         Address  Phone  Notes  [facility]  (206)221-8025   Domenic Schwab, PhD [address] Arlis Porta [place], [place]   367-098-9234 or 601-076-2732   [facility] [place], [place] (430)631-5953   [facility] [address] [place], [place] 215-141-8375 Insurance/Medicaid/sponsorship through [facility] [address]                                    [place], [place] (936)[phone] [place] [address], [place] ([phone]    Dr. Adele Schilder  (309)[phone]   [facility] Dept. 1) [address] [place] [address], [place] 2) [place] 3)  [address], [place] ([phone]) [phone]  [phone]   [place] (714)[phone] or ([phone] (After Hours)       Take the prescriptions as directed.  Apply moist heat or ice to the area(s) of discomfort, for 15 minutes at a time, several times per day for the next few days.  Do not fall asleep on a heating or ice pack. Wear the knee immobilizer and use the crutches until you are seen in follow up. Call your regular medical doctor and the Orthopedic doctor tomorrow to schedule a follow up appointment this week.  Return to the Emergency Department immediately if worsening.

## 2014-03-16 ENCOUNTER — Encounter: Payer: Self-pay | Admitting: Family

## 2014-03-16 ENCOUNTER — Ambulatory Visit (INDEPENDENT_AMBULATORY_CARE_PROVIDER_SITE_OTHER): Payer: BC Managed Care – PPO | Admitting: Family

## 2014-03-16 VITALS — BP 147/86 | HR 112 | Temp 97.4°F | Ht 65.0 in | Wt 188.2 lb

## 2014-03-16 DIAGNOSIS — IMO0002 Reserved for concepts with insufficient information to code with codable children: Secondary | ICD-10-CM

## 2014-03-16 DIAGNOSIS — S86912A Strain of unspecified muscle(s) and tendon(s) at lower leg level, left leg, initial encounter: Secondary | ICD-10-CM

## 2014-03-16 MED ORDER — KETOROLAC TROMETHAMINE 60 MG/2ML IM SOLN
60.0000 mg | Freq: Once | INTRAMUSCULAR | Status: AC
  Administered 2014-03-16: 60 mg via INTRAMUSCULAR

## 2014-03-16 MED ORDER — CYCLOBENZAPRINE HCL 5 MG PO TABS: 5.0000 mg | ORAL_TABLET | Freq: Three times a day (TID) | ORAL | Status: DC | PRN

## 2014-03-16 MED ORDER — MELOXICAM 15 MG PO TABS: 15.0000 mg | ORAL_TABLET | Freq: Every day | ORAL | Status: DC

## 2014-03-16 MED ORDER — METHYLPREDNISOLONE (PAK) 4 MG PO TABS: ORAL_TABLET | ORAL | Status: DC

## 2014-03-16 NOTE — Patient Instructions (Signed)

## 2014-03-16 NOTE — Progress Notes (Signed)
   Subjective:    Patient ID: Laurie Kelley, female    DOB: 25-Jul-1970, 43 y.o.   MRN: [id number]  Leg Pain  The incident occurred 2 days ago. The incident occurred at home. The injury mechanism was an inversion injury. The pain is present in the left leg. The quality of the pain is described as aching and burning. The pain is at a severity of 10/10. The pain is moderate. The pain has been fluctuating since onset. Associated symptoms include muscle weakness, numbness and tingling. Pertinent negatives include no inability to bear weight, loss of motion or loss of sensation. The symptoms are aggravated by movement. She has tried heat and ice for the symptoms. The treatment provided mild relief.   *ED notes reviewed    Review of Systems  Constitutional: Negative.   HENT: Negative.   Eyes: Negative.   Respiratory: Negative.  Negative for shortness of breath.   Cardiovascular: Negative.  Negative for palpitations.  Gastrointestinal: Negative.   Endocrine: Negative.   Genitourinary: Negative.   Musculoskeletal: Negative.   Neurological: Positive for tingling and numbness. Negative for headaches.  Hematological: Negative.   Psychiatric/Behavioral: Negative.   All other systems reviewed and are negative.      Objective:   Physical Exam  Vitals reviewed. Constitutional: She is oriented to person, place, and time. She appears well-developed and well-nourished. No distress.  HENT:  Head: Normocephalic and atraumatic.  Right Ear: External ear normal.  Left Ear: External ear normal.  Nose: Nose normal.  Mouth/Throat: Oropharynx is clear and moist.  Eyes: Pupils are equal, round, and reactive to light.  Neck: Normal range of motion. Neck supple. No thyromegaly present.  Cardiovascular: Normal rate, regular rhythm, normal heart sounds and intact distal pulses.   No murmur heard. Pulmonary/Chest: Effort normal and breath sounds normal. No respiratory distress. She has no wheezes.    Abdominal: Soft. Bowel sounds are normal. She exhibits no distension. There is no tenderness.  Musculoskeletal: Normal range of motion. She exhibits tenderness.  Pt unable to twist leg in either direction, pt able to flex foot and rotate foot  Neurological: She is alert and oriented to person, place, and time. She has normal reflexes. No cranial nerve deficit.  Skin: Skin is warm and dry.  Psychiatric: She has a normal mood and affect. Her behavior is normal. Judgment and thought content normal.   BP 147/86  Pulse 112  Temp(Src) 97.4 F (36.3 C) (Oral)  Ht 5\' 5"  (1.651 m)  Wt 188 lb 3.2 oz (85.367 kg)  BMI 31.32 kg/m2        Assessment & Plan:  1. Muscle strain of left lower leg, initial encounter -Rest -Ice -No other NSAIDs -Sedation precaution discussed with flexeril - meloxicam (MOBIC) 15 MG tablet; Take 1 tablet (15 mg total) by mouth daily.  Dispense: 30 tablet; Refill: 1 - ketorolac (TORADOL) injection 60 mg; Inject 2 mLs (60 mg total) into the muscle once. - cyclobenzaprine (FLEXERIL) 5 MG tablet; Take 1 tablet (5 mg total) by mouth 3 (three) times daily as needed for muscle spasms.  Dispense: 30 tablet; Refill: 0 - methylPREDNIsolone (MEDROL DOSPACK) 4 MG tablet; follow package directions  Dispense: 21 tablet; Refill: 0  Evelina Dun, FNP

## 2014-05-09 ENCOUNTER — Emergency Department (HOSPITAL_COMMUNITY)
Admission: EM | Admit: 2014-05-09 | Discharge: 2014-05-09 | Disposition: A | Discharge status: Home / Self Care | Payer: BC Managed Care – PPO | Attending: Emergency Medicine | Admitting: Emergency Medicine

## 2014-05-09 ENCOUNTER — Emergency Department (HOSPITAL_COMMUNITY): Payer: BC Managed Care – PPO

## 2014-05-09 ENCOUNTER — Encounter (HOSPITAL_COMMUNITY): Payer: Self-pay | Admitting: Emergency Medicine

## 2014-05-09 DIAGNOSIS — Z791 Long term (current) use of non-steroidal anti-inflammatories (NSAID): Secondary | ICD-10-CM | POA: Insufficient documentation

## 2014-05-09 DIAGNOSIS — R079 Chest pain, unspecified: Secondary | ICD-10-CM | POA: Diagnosis present

## 2014-05-09 DIAGNOSIS — J40 Bronchitis, not specified as acute or chronic: Secondary | ICD-10-CM | POA: Insufficient documentation

## 2014-05-09 DIAGNOSIS — R0789 Other chest pain: Secondary | ICD-10-CM

## 2014-05-09 LAB — CBC
HEMATOCRIT: 41.4 % (ref 36.0–46.0)
HEMOGLOBIN: 14 g/dL (ref 12.0–15.0)
MCH: 28.3 pg (ref 26.0–34.0)
MCHC: 33.8 g/dL (ref 30.0–36.0)
MCV: 83.8 fL (ref 78.0–100.0)
Platelets: 332 10*3/uL (ref 150–400)
RBC: 4.94 MIL/uL (ref 3.87–5.11)
RDW: 12.7 % (ref 11.5–15.5)
WBC: 7.1 10*3/uL (ref 4.0–10.5)

## 2014-05-09 LAB — BASIC METABOLIC PANEL
Anion gap: 15 (ref 5–15)
BUN: 9 mg/dL (ref 6–23)
CHLORIDE: 103 meq/L (ref 96–112)
CO2: 22 meq/L (ref 19–32)
Calcium: 10.1 mg/dL (ref 8.4–10.5)
Creatinine, Ser: 0.67 mg/dL (ref 0.50–1.10)
GFR calc Af Amer: >90 mL/min (ref >90)
GFR calc non Af Amer: >90 mL/min (ref >90)
GLUCOSE: 120 mg/dL — AB (ref 70–99)
POTASSIUM: 4.2 meq/L (ref 3.7–5.3)
Sodium: 140 mEq/L (ref 137–147)

## 2014-05-09 LAB — I-STAT TROPONIN, ED
TROPONIN I, POC: 0.01 ng/mL (ref 0.00–0.08)
Troponin i, poc: 0 ng/mL (ref 0.00–0.08)

## 2014-05-09 NOTE — ED Provider Notes (Signed)
CSN: [id number]     Arrival date & time 05/09/14  1423 History   First MD Initiated Contact with Patient 05/09/14 1529     Chief Complaint  Patient presents with  . Chest Pain     (Consider location/radiation/quality/duration/timing/severity/associated sxs/prior Treatment) HPI Comments: 43 year old female with history of cholecystectomy, nonsmoker presents with chest pressure for the past week, I clarified the nurses note with the patient. Patient's had sinus congestion, productive cough for the past week as well. She is taking amoxicillin. Patient has no cardiac or blood clot history, no recent surgeries, no leg swelling, no active cancer, no hemoptysis and no recent travel. Chest pressures constant worse with coughing. Patient has family history of cardiac with her father.  Patient is a 43 y.o. female presenting with chest pain. The history is provided by the patient.  Chest Pain Associated symptoms: cough   Associated symptoms: no abdominal pain, no back pain, no fever, no headache, no shortness of breath and not vomiting     History reviewed. No pertinent past medical history. Past Surgical History  Procedure Laterality Date  . Cholecystectomy     Family History  Problem Relation Age of Onset  . Heart disease Father    History  Substance Use Topics  . Smoking status: Never Smoker   . Smokeless tobacco: Not on file  . Alcohol Use: No   OB History    No data available     Review of Systems  Constitutional: Negative for fever and chills.  HENT: Positive for congestion.   Respiratory: Positive for cough. Negative for shortness of breath.   Cardiovascular: Positive for chest pain. Negative for leg swelling.  Gastrointestinal: Negative for vomiting and abdominal pain.  Musculoskeletal: Negative for back pain.  Skin: Negative for rash.  Neurological: Negative for light-headedness and headaches.      Allergies  Review of patient's allergies indicates no known  allergies.  Home Medications   Prior to Admission medications   Medication Sig Start Date End Date Taking? Authorizing Provider  medroxyPROGESTERone (DEPO-PROVERA) 150 MG/ML injection Inject 150 mg into the muscle every 3 (three) months.   Yes Historical Provider, MD  meloxicam (MOBIC) 15 MG tablet Take 1 tablet (15 mg total) by mouth daily. 03/16/14  Yes Sharion Balloon, FNP  naproxen (NAPROSYN) 250 MG tablet Take 1 tablet (250 mg total) by mouth 2 (two) times daily as needed for mild pain or moderate pain (take with food). 03/15/14  Yes Francine Graven, DO  cyclobenzaprine (FLEXERIL) 5 MG tablet Take 1 tablet (5 mg total) by mouth 3 (three) times daily as needed for muscle spasms. Patient not taking: Reported on 05/09/2014 03/16/14   Sharion Balloon, FNP  HYDROcodone-acetaminophen (NORCO/VICODIN) 5-325 MG per tablet 1 or 2 tabs PO q6 hours prn pain Patient not taking: Reported on 05/09/2014 03/15/14   Francine Graven, DO  medroxyPROGESTERone (DEPO-PROVERA) 150 MG/ML injection INJECT 150MG  INTRAMUSCULARLY EVERY 3 MONTHS AS INSTRUCTED Patient not taking: Reported on 05/09/2014    Mary-Margaret Hassell Done, FNP  methylPREDNIsolone (MEDROL DOSPACK) 4 MG tablet follow package directions Patient not taking: Reported on 05/09/2014 03/16/14   Sharion Balloon, FNP   BP 129/71 mmHg  Pulse 74  Temp(Src) 97.2 F (36.2 C) (Oral)  Resp 14  Ht 5\' 5"  (1.651 m)  Wt 191 lb (86.637 kg)  BMI 31.78 kg/m2  SpO2 100% Physical Exam  Constitutional: She is oriented to person, place, and time. She appears well-developed and well-nourished.  HENT:  Head: Normocephalic and  atraumatic.  Congested, significant cough in the room  Eyes: Right eye exhibits no discharge. Left eye exhibits no discharge.  Neck: Normal range of motion. Neck supple. No tracheal deviation present.  Cardiovascular: Normal rate and regular rhythm.   No murmur heard. Pulmonary/Chest: Effort normal and breath sounds normal.  Abdominal: Soft.  She exhibits no distension. There is no tenderness. There is no guarding.  Musculoskeletal: She exhibits no edema or tenderness.  Neurological: She is alert and oriented to person, place, and time.  Skin: Skin is warm. No rash noted.  Psychiatric: She has a normal mood and affect.  Nursing note and vitals reviewed.   ED Course  Procedures (including critical care time) Labs Review Labs Reviewed  BASIC METABOLIC PANEL - Abnormal; Notable for the following:    Glucose, Bld 120 (*)    All other components within normal limits  CBC  I-STAT TROPOININ, ED  I-STAT TROPOININ, ED    Imaging Review Dg Chest 2 View  05/09/2014   CLINICAL DATA:  Dry cough, mid sternal chest tightness for 1 week, LEFT arm tingling and numbness since yesterday, chest pressure  EXAM: CHEST  2 VIEW  COMPARISON:  None ; correlation CT chest 08/24/2006  FINDINGS: Normal heart size, mediastinal contours, and pulmonary vascularity.  Lungs clear.  No pulmonary infiltrate, pleural effusion or pneumothorax.  Bones demineralized.  IMPRESSION: Normal exam.   Electronically Signed   By: Lavonia Dana M.D.   On: 05/09/2014 17:39     EKG Interpretation   Date/Time:  Saturday May 09 2014 14:27:35 EST Ventricular Rate:  92 PR Interval:  130 QRS Duration: 68 QT Interval:  332 QTC Calculation: 410 R Axis:   63 Text Interpretation:  Normal sinus rhythm Normal ECG Confirmed by Telly Broberg   MD, Latoria Dry ([id number]) on 05/09/2014 3:33:00 PM      MDM   Final diagnoses:  Chest pressure  Bronchitis   Well-appearing female with clinically upper respiratory infection/bronchitis/sinusitis. Chest pressure constant for the past week. Patient low risk cardiac, plan for troponin, chest x-ray, EKG reviewed no acute findings. Discussed outpatient follow-up and reasons to return.  Troponin negative, chest x-ray reviewed no acute findings. Patient has mild tachycardia, clinically mildly hydrated in mild pain. Patient does not want anything  for the pain. Patient is very low risk for blood clot and no shortness of breath no hypoxia.  Results and differential diagnosis were discussed with the patient/parent/guardian. Close follow up outpatient was discussed, comfortable with the plan.   Medications - No data to display  Filed Vitals:   05/09/14 1427 05/09/14 1700 05/09/14 1745 05/09/14 1800  BP: 137/70 121/74 134/70 129/71  Pulse: 103 114 78 74  Temp: 97.2 F (36.2 C)     TempSrc: Oral     Resp: 20 16 17 14   Height: 5\' 5"  (1.651 m)     Weight: 191 lb (86.637 kg)     SpO2: 100% 97% 99% 100%    Final diagnoses:  Chest pressure  Bronchitis        Mariea Clonts, MD 05/10/14 0022

## 2014-05-09 NOTE — Discharge Instructions (Signed)
If you were given medicines take as directed.  If you are on coumadin or contraceptives realize their levels and effectiveness is altered by many different medicines.  If you have any reaction (rash, tongues swelling, other) to the medicines stop taking and see a physician.   Please follow up as directed and return to the ER or see a physician for new or worsening symptoms.  Thank you. Filed Vitals:   05/09/14 1427 05/09/14 1700  BP: 137/70 121/74  Pulse: 103 114  Temp: 97.2 F (36.2 C)   TempSrc: Oral   Resp: 20 16  Height: 5\' 5"  (1.651 m)   Weight: 191 lb (86.637 kg)   SpO2: 100% 97%

## 2014-05-09 NOTE — ED Notes (Signed)
Pt. Stated, I've been having CP since yesterday.  No other symptoms.  i also have a sinus infection and a twisted ankle.  I take amoxicillin and Meloxican.

## 2014-05-12 ENCOUNTER — Other Ambulatory Visit: Payer: Self-pay | Admitting: Family

## 2014-06-02 ENCOUNTER — Ambulatory Visit (INDEPENDENT_AMBULATORY_CARE_PROVIDER_SITE_OTHER): Payer: BC Managed Care – PPO | Admitting: *Deleted

## 2014-06-02 DIAGNOSIS — Z308 Encounter for other contraceptive management: Secondary | ICD-10-CM

## 2014-06-02 NOTE — Patient Instructions (Signed)

## 2014-06-02 NOTE — Progress Notes (Signed)
Depo Provera given and tolerated well.

## 2014-08-11 ENCOUNTER — Ambulatory Visit (INDEPENDENT_AMBULATORY_CARE_PROVIDER_SITE_OTHER): Payer: BLUE CROSS/BLUE SHIELD | Admitting: Nurse Practitioner

## 2014-08-11 ENCOUNTER — Encounter: Payer: Self-pay | Admitting: Nurse Practitioner

## 2014-08-11 VITALS — BP 129/83 | HR 88 | Temp 97.1°F | Ht 65.0 in | Wt 186.0 lb

## 2014-08-11 DIAGNOSIS — K625 Hemorrhage of anus and rectum: Secondary | ICD-10-CM

## 2014-08-11 DIAGNOSIS — G4700 Insomnia, unspecified: Secondary | ICD-10-CM

## 2014-08-11 DIAGNOSIS — Z01419 Encounter for gynecological examination (general) (routine) without abnormal findings: Secondary | ICD-10-CM

## 2014-08-11 DIAGNOSIS — Z0000 Encounter for general adult medical examination without abnormal findings: Secondary | ICD-10-CM

## 2014-08-11 DIAGNOSIS — Z23 Encounter for immunization: Secondary | ICD-10-CM

## 2014-08-11 LAB — POCT UA - MICROSCOPIC ONLY
Casts, Ur, LPF, POC: NEGATIVE
Crystals, Ur, HPF, POC: NEGATIVE
YEAST UA: NEGATIVE

## 2014-08-11 LAB — POCT URINALYSIS DIPSTICK
Bilirubin, UA: NEGATIVE
Blood, UA: NEGATIVE
Glucose, UA: NEGATIVE
KETONES UA: NEGATIVE
Nitrite, UA: NEGATIVE
PH UA: 5
Spec Grav, UA: 1.02
UROBILINOGEN UA: NEGATIVE

## 2014-08-11 LAB — POCT CBC
GRANULOCYTE PERCENT: 59.3 % (ref 37–80)
HCT, POC: 44 % (ref 37.7–47.9)
HEMOGLOBIN: 13.4 g/dL (ref 12.2–16.2)
Lymph, poc: 2.5 (ref 0.6–3.4)
MCH, POC: 26 pg — AB (ref 27–31.2)
MCHC: 30.5 g/dL — AB (ref 31.8–35.4)
MCV: 85.2 fL (ref 80–97)
MPV: 7.9 fL (ref 0–99.8)
PLATELET COUNT, POC: 342 10*3/uL (ref 142–424)
POC GRANULOCYTE: 4 (ref 2–6.9)
POC LYMPH %: 37.2 % (ref 10–50)
RBC: 5.16 M/uL (ref 4.04–5.48)
RDW, POC: 13.3 %
WBC: 6.7 10*3/uL (ref 4.6–10.2)

## 2014-08-11 MED ORDER — MEDROXYPROGESTERONE ACETATE 150 MG/ML IM SUSP: INTRAMUSCULAR | Status: DC

## 2014-08-11 MED ORDER — ZOLPIDEM TARTRATE 10 MG PO TABS: 10.0000 mg | ORAL_TABLET | Freq: Every evening | ORAL | Status: DC | PRN

## 2014-08-11 NOTE — Patient Instructions (Signed)
Pap Test A Pap test checks the cells on the surface of your cervix. Your doctor will look for cell changes that are not normal, an infection, or cancer. If the cells no longer look normal, it is called dysplasia. Dysplasia can turn into cancer. Regular Pap tests are important to stop cancer from developing. BEFORE THE PROCEDURE  Ask your doctor when to schedule your Pap test. Timing the test around your period may be important.  Do not douche or have sex (intercourse) for 24 hours before the test.  Do not put creams on your vagina or use tampons for 24 hours before the test.  Go pee (urinate) just before the test. PROCEDURE  You will lie on an exam table with your feet in stirrups.  A warm metal or plastic tool (speculum) will be put in your vagina to open it up.  Your doctor will use a small, plastic brush or wooden spatula to take cells from your cervix.  The cells will be put in a lab container.  The cells will be checked under a microscope to see if they are normal or not. AFTER THE PROCEDURE Get your test results. If they are abnormal, you may need more tests. Document Released: 07/08/2010 Document Revised: 08/28/2011 Document Reviewed: 06/01/2011 ExitCare Patient Information 2015 [employer]. This information is not intended to replace advice given to you by your health care provider. Make sure you discuss any questions you have with your health care provider.  

## 2014-08-11 NOTE — Progress Notes (Signed)
   Subjective:    Patient ID: Laurie Kelley, female    DOB: [birth year], 44 y.o.   MRN: [id number]  HPI Patient in today for PAP and CPE-  Doing well- no complaints today- Only current meds is Deprovera shot.   * In November patient had a lot of rectal bleeding- filled toilet up lasted into the month of December- In January it started 2-3 x a week and none so far this month.  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  Review of Systems  Constitutional: Negative.   HENT: Negative.   Respiratory: Negative.   Cardiovascular: Negative.   Genitourinary: Negative.   Neurological: Negative.   Psychiatric/Behavioral: Negative.   All other systems reviewed and are negative.      Objective:   Physical Exam  Constitutional: She is oriented to person, place, and time. She appears well-developed and well-nourished.  HENT:  Head: Normocephalic.  Right Ear: Hearing, tympanic membrane, external ear and ear canal normal.  Left Ear: Hearing, tympanic membrane, external ear and ear canal normal.  Nose: Nose normal.  Mouth/Throat: Uvula is midline and oropharynx is clear and moist.  Eyes: Conjunctivae and EOM are normal. Pupils are equal, round, and reactive to light.  Neck: Normal range of motion and full passive range of motion without pain. Neck supple. No JVD present. Carotid bruit is not present. No thyroid mass and no thyromegaly present.  Cardiovascular: Normal rate, normal heart sounds and intact distal pulses.   No murmur heard. Pulmonary/Chest: Effort normal and breath sounds normal. Right breast exhibits no inverted nipple, no mass, no nipple discharge, no skin change and no tenderness. Left breast exhibits no inverted nipple, no mass, no nipple discharge, no skin change and no tenderness.  Abdominal: Soft. Bowel sounds are normal. She exhibits no mass. There is no tenderness.  Genitourinary: Vagina  normal and uterus normal. No breast swelling, tenderness, discharge or bleeding.  bimanual exam-No adnexal masses or tenderness. Cervix parous and pink- no discharge  Musculoskeletal: Normal range of motion.  Lymphadenopathy:    She has no cervical adenopathy.  Neurological: She is alert and oriented to person, place, and time.  Skin: Skin is warm and dry.  Psychiatric: She has a normal mood and affect. Her behavior is normal. Judgment and thought content normal.    BP 129/83 mmHg  Pulse 88  Temp(Src) 97.1 F (36.2 C) (Oral)  Ht $R'5\' 5"'MM$  (1.651 m)  Wt 186 lb (84.369 kg)  BMI 30.95 kg/m2       Assessment & Plan:  1. Annual physical exam - POCT UA - Microscopic Only - POCT urinalysis dipstick - CMP14+EGFR - NMR, lipoprofile - POCT CBC - Thyroid Panel With TSH - Tdap vaccine greater than or equal to 7yo IM  2. Rectal bleeding - Ambulatory referral to Gastroenterology  3. Encounter for routine gynecological examination - Pap IG w/ reflex to HPV when ASC-U  4. Insomnia Bedtime ritual  do not eat 2 hours prior to bedtime - zolpidem (AMBIEN) 10 MG tablet; Take 1 tablet (10 mg total) by mouth at bedtime as needed for sleep.  Dispense: 30 tablet; Refill: 3    Labs pending Health maintenance reviewed Diet and exercise encouraged Continue all meds Follow up  In 6 month   Kirkpatrick, FNP

## 2014-08-12 LAB — NMR, LIPOPROFILE
CHOLESTEROL: 207 mg/dL — AB (ref 100–199)
HDL Cholesterol by NMR: 27 mg/dL — ABNORMAL LOW (ref >39)
HDL Particle Number: 24.7 umol/L — ABNORMAL LOW (ref >=30.5)
LDL PARTICLE NUMBER: 1481 nmol/L — AB (ref <1000)
LDL Size: 19.8 nm (ref >20.5)
LDL-C: 134 mg/dL — AB (ref 0–99)
LP-IR Score: 57 — ABNORMAL HIGH (ref <=45)
SMALL LDL PARTICLE NUMBER: 889 nmol/L — AB (ref <=527)
TRIGLYCERIDES BY NMR: 231 mg/dL — AB (ref 0–149)

## 2014-08-12 LAB — THYROID PANEL WITH TSH
Free Thyroxine Index: 2.2 (ref 1.2–4.9)
T3 UPTAKE RATIO: 31 % (ref 24–39)
T4 TOTAL: 7 ug/dL (ref 4.5–12.0)
TSH: 1.49 u[IU]/mL (ref 0.450–4.500)

## 2014-08-12 LAB — CMP14+EGFR
A/G RATIO: 1.9 (ref 1.1–2.5)
ALT: 13 IU/L (ref 0–32)
AST: 14 IU/L (ref 0–40)
Albumin: 4.4 g/dL (ref 3.5–5.5)
Alkaline Phosphatase: 117 IU/L (ref 39–117)
BUN/Creatinine Ratio: 10 (ref 9–23)
BUN: 8 mg/dL (ref 6–24)
Bilirubin Total: 0.3 mg/dL (ref 0.0–1.2)
CO2: 22 mmol/L (ref 18–29)
Calcium: 10.4 mg/dL — ABNORMAL HIGH (ref 8.7–10.2)
Chloride: 101 mmol/L (ref 97–108)
Creatinine, Ser: 0.82 mg/dL (ref 0.57–1.00)
GFR calc Af Amer: 103 mL/min/{1.73_m2} (ref >59)
GFR, EST NON AFRICAN AMERICAN: 89 mL/min/{1.73_m2} (ref >59)
GLUCOSE: 95 mg/dL (ref 65–99)
Globulin, Total: 2.3 g/dL (ref 1.5–4.5)
POTASSIUM: 3.8 mmol/L (ref 3.5–5.2)
Sodium: 138 mmol/L (ref 134–144)
Total Protein: 6.7 g/dL (ref 6.0–8.5)

## 2014-08-14 LAB — PAP IG W/ RFLX HPV ASCU: PAP Smear Comment: 0

## 2014-08-17 ENCOUNTER — Telehealth: Payer: Self-pay | Admitting: *Deleted

## 2014-08-17 NOTE — Telephone Encounter (Signed)
She is aware of lab results but wants to speak to nurse about statin that was recommended.  Call Marylou at [phone]

## 2014-08-17 NOTE — Telephone Encounter (Signed)
Spoke with patient.

## 2014-08-17 NOTE — Telephone Encounter (Signed)
-----   Message from [facility], [place] sent at 08/17/2014  8:12 AM EST ----- CBC normal Kidney and liver function stable LDL particle numbers is a liitle better but not much- Trig are elevated also- needs to be on statin. Will take? Thyroid normal PAP normal- repeat in 2 years Strict low fat diet and exercise

## 2014-08-18 ENCOUNTER — Ambulatory Visit (INDEPENDENT_AMBULATORY_CARE_PROVIDER_SITE_OTHER): Payer: BLUE CROSS/BLUE SHIELD | Admitting: Nurse Practitioner

## 2014-08-18 ENCOUNTER — Other Ambulatory Visit: Payer: Self-pay | Admitting: Nurse Practitioner

## 2014-08-18 ENCOUNTER — Encounter: Payer: Self-pay | Admitting: Nurse Practitioner

## 2014-08-18 VITALS — BP 120/78 | HR 81 | Ht 65.0 in | Wt 185.2 lb

## 2014-08-18 DIAGNOSIS — K625 Hemorrhage of anus and rectum: Secondary | ICD-10-CM | POA: Insufficient documentation

## 2014-08-18 DIAGNOSIS — K219 Gastro-esophageal reflux disease without esophagitis: Secondary | ICD-10-CM

## 2014-08-18 DIAGNOSIS — K648 Other hemorrhoids: Secondary | ICD-10-CM | POA: Insufficient documentation

## 2014-08-18 HISTORY — DX: Gastro-esophageal reflux disease without esophagitis: K21.9

## 2014-08-18 HISTORY — DX: Other hemorrhoids: K64.8

## 2014-08-18 MED ORDER — SIMVASTATIN 40 MG PO TABS: 40.0000 mg | ORAL_TABLET | Freq: Every day | ORAL | Status: DC

## 2014-08-18 MED ORDER — HYDROCORTISONE ACETATE 25 MG RE SUPP: RECTAL | Status: DC

## 2014-08-18 NOTE — Patient Instructions (Addendum)
Anusol HC suppositories one nightly for 7 days. We have given you Antireflux literature. We have given you a brochure on colonoscopies. Please consider colonoscopy for further evaluation of rectal bleeding.

## 2014-08-18 NOTE — Progress Notes (Addendum)
    HPI :  Patient is a 44 year old female referred for PCP evaluation of rectal bleeding. Late November patient developed rectal bleeding with bowel movements. This went on for about a week then resolved. Bleeding recurred in December and occurred intermittently through January.  No bleeding through the month of February. No associated constipation,  in fact stools have been loose since cholecystectomy in the 1990s. She gets heartburn 2-3 times a month, it is alleviated with milk. No other GI complaints. Patient's weight is stable. She has no family history of colorectal cancer.   Past Medical History  Diagnosis Date  . Gallstones     Family History  Problem Relation Age of Onset  . Heart disease Father   . Lung cancer Maternal Aunt   . Brain cancer Maternal Aunt   . Bone cancer Maternal Aunt    History  Substance Use Topics  . Smoking status: Never Smoker   . Smokeless tobacco: Not on file  . Alcohol Use: No   Current Outpatient Prescriptions  Medication Sig Dispense Refill  . HYDROcodone-acetaminophen (NORCO/VICODIN) 5-325 MG per tablet   0  . medroxyPROGESTERone (DEPO-PROVERA) 150 MG/ML injection INJECT 150MG  INTRAMUSCULARLY EVERY 3 MONTHS AS INSTRUCTED 1 mL 11  . zolpidem (AMBIEN) 10 MG tablet Take 1 tablet (10 mg total) by mouth at bedtime as needed for sleep. 30 tablet 3   No current facility-administered medications for this visit.   No Known Allergies   Review of Systems: Positive for headaches, sleeping problems and excessive urination. All other systems reviewed and negative except where noted in HPI.   Physical Exam: BP 120/78 mmHg  Pulse 81  Ht 5\' 5"  (1.651 m)  Wt 185 lb 3.2 oz (84.006 kg)  BMI 30.82 kg/m2  SpO2 99% Constitutional: Pleasant,well-developed, female in no acute distress. HEENT: Normocephalic and atraumatic. Conjunctivae are normal. No scleral icterus. Neck supple.  Cardiovascular: Normal rate, regular rhythm.  Pulmonary/chest: Effort normal  and breath sounds normal. No wheezing, rales or rhonchi. Abdominal: Soft, nondistended, nontender. Bowel sounds active throughout. There are no masses palpable. No hepatomegaly. Extremities: no edema Lymphadenopathy: No cervical adenopathy noted. Neurological: Alert and oriented to person place and time. Skin: Skin is warm and dry. No rashes noted. Psychiatric: Normal mood and affect. Behavior is normal.   ASSESSMENT AND PLAN:   54. 44 year old female with intermittent bleeding November through January. No bleeding since. She does have small internal hemorrhoids on exam, possibly the source of bleeding. I did explain that there could be other etiologies of bleeding such as polyps, colon cancer, etc and that colonoscopy is recommended to evaluate for such things. Patient prefers to treat the internal hemorrhoids with steroid suppositories and hold off on colonoscopy. She will call us for recurrent bleeding. I did ask her to continue to consider colonoscopy, written literature about the procedure provided.  2. Heartburn. She is symptomatic 2-3 times a month. Will provide her with antireflux literature to read. If no improvement, consider H2 blocker.   Addendum: Reviewed and agree with initial management.  If patient returns for colonoscopy as requested, other etiologies excluded, and if internal hemorrhoids remain symptomatic banding can be offered  Jerene Bears, MD

## 2014-09-02 ENCOUNTER — Ambulatory Visit (INDEPENDENT_AMBULATORY_CARE_PROVIDER_SITE_OTHER): Payer: BLUE CROSS/BLUE SHIELD | Admitting: *Deleted

## 2014-09-02 DIAGNOSIS — Z3049 Encounter for surveillance of other contraceptives: Secondary | ICD-10-CM | POA: Diagnosis not present

## 2014-09-02 DIAGNOSIS — Z3042 Encounter for surveillance of injectable contraceptive: Secondary | ICD-10-CM

## 2014-09-02 MED ORDER — MEDROXYPROGESTERONE ACETATE 150 MG/ML IM SUSP
150.0000 mg | INTRAMUSCULAR | Status: AC
  Administered 2014-09-02 – 2015-06-01 (×4): 150 mg via INTRAMUSCULAR

## 2014-09-02 NOTE — Patient Instructions (Signed)
Estradiol Cypionate; Medroxyprogesterone contraceptive injection What is this medicine? ESTRADIOL CYPIONATE; MEDROXYPROGESTERONE (es tra DYE ole sip EYE oh nate; me DROX ee proe JES te rone) is a birth-control method to prevent an unwanted pregnancy. Each injection provides birth control for 1 month (30 days). NOTE: This drug is discontinued in the [place]. This medicine may be used for other purposes; ask your health care provider or pharmacist if you have questions. COMMON BRAND NAME(S): Lunelle What should I tell my health care provider before I take this medicine? They need to know if you have or ever had any of these conditions: -abnormal vaginal bleeding -blood vessel disease or blood clots -breast, cervical, endometrial, ovarian, liver, or uterine cancer -diabetes -gallbladder disease -heart disease or recent heart attack -high blood pressure -high cholesterol -kidney disease -liver disease -migraine headaches -stroke -systemic lupus erythematosus (SLE) -tobacco smoker -an unusual or allergic reaction to estrogens, progestins, or other medicines, foods, dyes, or preservatives -pregnant or trying to get pregnant -breast-feeding How should I use this medicine? This medicine is for injection into a muscle. It is given by a health-care professional. The first injection is usually given during the first 5 days after the start of a menstrual period. This medicine will provide birth control for roughly 28 to 30 days. Talk to your pediatrician regarding the use of this medicine in children. Special care may be needed. This medicine has been used in female children who have started having menstrual periods. A patient package insert for the product will be given with each prescription and refill. Read this sheet carefully each time. The sheet may change frequently. Overdosage: If you think you have taken too much of this medicine contact a poison control center or emergency room at  once. NOTE: This medicine is only for you. Do not share this medicine with others. What if I miss a dose? Try not to miss a dose. You will need an injection once per month in order to maintain birth control. If you cannot keep an appointment call to reschedule. If it has been more than 4 weeks (33 days) since your last injection, you will need to have a pregnancy test before you can have another injection. What may interact with this medicine? -acetaminophen -antibiotics or medicines for infections, especially rifampin, rifabutin, rifapentine, and griseofulvin, and possibly penicillins or tetracyclines -aprepitant -ascorbic acid (vitamin C) -atorvastatin -barbiturate medicines, such as phenobarbital -bosentan -carbamazepine -caffeine -clofibrate -cyclosporine -dantrolene -doxercalciferol -felbamate -grapefruit juice -hydrocortisone -medicines for anxiety or sleeping problems, such as diazepam or temazepam -medicines for diabetes, including pioglitazone -mineral oil -modafinil -mycophenolate -nefazodone -oxcarbazepine -phenytoin -prednisolone -ritonavir or other medicines for HIV infection or AIDS -rosuvastatin -selegiline -soy isoflavones supplements -St. John's wort -tamoxifen or raloxifene -theophylline -thyroid hormones -topiramate -warfarin This list may not describe all possible interactions. Give your health care provider a list of all the medicines, herbs, non-prescription drugs, or dietary supplements you use. Also tell them if you smoke, drink alcohol, or use illegal drugs. Some items may interact with your medicine. What should I watch for while using this medicine? Visit your doctor or health care professional for regular checks on your progress. You will need a regular breast and pelvic exam and Pap smear while on this medicine. Smoking increases the risk of getting a blood clot or having a stroke while you are taking hormonal birth control, especially if you  are more than 42 years old. You are strongly advised not to smoke. Use of this product may cause  you to lose calcium from your bones. Loss of calcium may cause weak bones (osteoporosis). Only use this product for more than 2 years if other forms of birth control are not right for you. Ask your health care professional how you can keep strong bones. If you have received your injections on time, your chance of being pregnant is very low. If you think you may be pregnant, see your health care professional as soon as possible. Tell your health care professional if you want to get pregnant within the next year. Another form of birth control may be a better choice. If you are going to have elective surgery, you may need to stop taking this medicine before the surgery. Consult your health care professional for advice. This medicine does not protect you against HIV infection (AIDS) or any other sexually transmitted diseases. What side effects may I notice from receiving this medicine? Side effects that you should report to your doctor or health care professional as soon as possible: -allergic reactions like skin rash, itching or hives, swelling of the face, lips, or tongue -breast tissue changes or discharge -changes in vision -chest pain -confusion, trouble speaking or understanding -dark urine -general ill feeling or flu-like symptoms -light-colored stools -nausea, vomiting -pain, swelling, warmth in the leg -right upper belly pain -severe headaches -shortness of breath -sudden numbness or weakness of the face, arm or leg -trouble walking, dizziness, loss of balance or coordination -unusual vaginal bleeding -yellowing of the eyes or skin Side effects that usually do not require medical attention (report to your doctor or health care professional if they continue or are bothersome): -back pain -breast tenderness -depressed mood or mood swings -hair loss -increased hunger or thirst -increased  urination -fluid retention and swelling -stomach cramps or bloating -symptoms of vaginal infection like itching, irritation or unusual discharge -unusually weak or tired This list may not describe all possible side effects. Call your doctor for medical advice about side effects. You may report side effects to FDA at 1-800-FDA-1088. Where should I keep my medicine? This drug is given in a hospital or clinic and will not be stored at home. NOTE: This sheet is a summary. It may not cover all possible information. If you have questions about this medicine, talk to your doctor, pharmacist, or health care provider.  2015, [employer]vier/[employer] Standard. (2008-05-21 11:57:04)

## 2014-10-23 ENCOUNTER — Encounter: Payer: Self-pay | Admitting: Nurse Practitioner

## 2014-12-04 ENCOUNTER — Ambulatory Visit (INDEPENDENT_AMBULATORY_CARE_PROVIDER_SITE_OTHER): Payer: BLUE CROSS/BLUE SHIELD

## 2014-12-04 DIAGNOSIS — Z3049 Encounter for surveillance of other contraceptives: Secondary | ICD-10-CM

## 2014-12-04 DIAGNOSIS — Z3042 Encounter for surveillance of injectable contraceptive: Secondary | ICD-10-CM

## 2015-02-17 ENCOUNTER — Other Ambulatory Visit: Payer: Self-pay | Admitting: Nurse Practitioner

## 2015-02-17 NOTE — Telephone Encounter (Signed)
Last filled 11/11/14, last seen 08/11/14. Call in at [facility]

## 2015-02-25 ENCOUNTER — Other Ambulatory Visit: Payer: Self-pay

## 2015-02-25 DIAGNOSIS — G4700 Insomnia, unspecified: Secondary | ICD-10-CM

## 2015-02-25 MED ORDER — ZOLPIDEM TARTRATE 10 MG PO TABS: 10.0000 mg | ORAL_TABLET | Freq: Every evening | ORAL | Status: DC | PRN

## 2015-02-25 NOTE — Telephone Encounter (Signed)
Last seen 08/11/14  MMM If approved route to nurse to call into [facility]

## 2015-02-25 NOTE — Telephone Encounter (Signed)
Please call in ambien with 1 refills 

## 2015-02-25 NOTE — Telephone Encounter (Signed)
rx called into pharmacy

## 2015-03-03 ENCOUNTER — Ambulatory Visit (INDEPENDENT_AMBULATORY_CARE_PROVIDER_SITE_OTHER): Payer: BLUE CROSS/BLUE SHIELD | Admitting: *Deleted

## 2015-03-03 DIAGNOSIS — Z3049 Encounter for surveillance of other contraceptives: Secondary | ICD-10-CM

## 2015-03-03 DIAGNOSIS — Z3042 Encounter for surveillance of injectable contraceptive: Secondary | ICD-10-CM

## 2015-03-03 NOTE — Progress Notes (Signed)
Patient tolerated well.

## 2015-06-01 ENCOUNTER — Ambulatory Visit (INDEPENDENT_AMBULATORY_CARE_PROVIDER_SITE_OTHER): Payer: BLUE CROSS/BLUE SHIELD

## 2015-06-01 DIAGNOSIS — Z3042 Encounter for surveillance of injectable contraceptive: Secondary | ICD-10-CM

## 2015-06-01 DIAGNOSIS — Z3049 Encounter for surveillance of other contraceptives: Secondary | ICD-10-CM

## 2015-06-22 ENCOUNTER — Telehealth: Payer: Self-pay | Admitting: Nurse Practitioner

## 2015-08-18 ENCOUNTER — Ambulatory Visit (INDEPENDENT_AMBULATORY_CARE_PROVIDER_SITE_OTHER): Payer: BLUE CROSS/BLUE SHIELD | Admitting: Nurse Practitioner

## 2015-08-18 ENCOUNTER — Encounter: Payer: Self-pay | Admitting: Nurse Practitioner

## 2015-08-18 VITALS — BP 125/81 | HR 97 | Temp 96.9°F | Ht 65.0 in | Wt 181.2 lb

## 2015-08-18 DIAGNOSIS — E785 Hyperlipidemia, unspecified: Secondary | ICD-10-CM | POA: Insufficient documentation

## 2015-08-18 DIAGNOSIS — R350 Frequency of micturition: Secondary | ICD-10-CM | POA: Diagnosis not present

## 2015-08-18 DIAGNOSIS — E782 Mixed hyperlipidemia: Secondary | ICD-10-CM | POA: Insufficient documentation

## 2015-08-18 DIAGNOSIS — N3090 Cystitis, unspecified without hematuria: Secondary | ICD-10-CM | POA: Diagnosis not present

## 2015-08-18 HISTORY — DX: Hyperlipidemia, unspecified: E78.5

## 2015-08-18 LAB — POCT URINALYSIS DIPSTICK
Bilirubin, UA: NEGATIVE
Glucose, UA: NEGATIVE
KETONES UA: NEGATIVE
Leukocytes, UA: NEGATIVE
Nitrite, UA: NEGATIVE
PH UA: 6
UROBILINOGEN UA: NEGATIVE

## 2015-08-18 LAB — POCT UA - MICROSCOPIC ONLY
CASTS, UR, LPF, POC: NEGATIVE
CRYSTALS, UR, HPF, POC: NEGATIVE
YEAST UA: NEGATIVE

## 2015-08-18 MED ORDER — SIMVASTATIN 40 MG PO TABS: 40.0000 mg | ORAL_TABLET | Freq: Every day | ORAL | Status: DC

## 2015-08-18 MED ORDER — NITROFURANTOIN MONOHYD MACRO 100 MG PO CAPS: 100.0000 mg | ORAL_CAPSULE | Freq: Two times a day (BID) | ORAL | Status: DC

## 2015-08-18 NOTE — Progress Notes (Signed)
   Subjective:    Patient ID: Laurie Kelley, female    DOB: 01/31/1971, 45 y.o.   MRN: [id number]   Patient here today for follow up of chronic medical problems.  Outpatient Encounter Prescriptions as of 08/18/2015  Medication Sig  . medroxyPROGESTERone (DEPO-PROVERA) 150 MG/ML injection INJECT 150MG INTRAMUSCULARLY EVERY 3 MONTHS AS INSTRUCTED  . simvastatin (ZOCOR) 40 MG tablet Take 1 tablet (40 mg total) by mouth at bedtime.   * C/O urinary frequency and urgency- started several days ago and has worsened  Hyperlipidemia This is a chronic problem. The current episode started more than 1 year ago. Recent lipid tests were reviewed and are variable. She has no history of diabetes, hypothyroidism or obesity. Current antihyperlipidemic treatment includes statins. The current treatment provides moderate improvement of lipids. Compliance problems include adherence to diet and adherence to exercise.  Risk factors for coronary artery disease include hypertension and obesity.      Review of Systems  Constitutional: Negative.   HENT: Negative.   Respiratory: Negative.   Cardiovascular: Negative.   Genitourinary: Positive for dysuria, urgency and frequency.  Musculoskeletal: Negative.   Neurological: Negative.   Psychiatric/Behavioral: Negative.   All other systems reviewed and are negative.      Objective:   Physical Exam  Constitutional: She is oriented to person, place, and time. She appears well-developed and well-nourished.  HENT:  Nose: Nose normal.  Mouth/Throat: Oropharynx is clear and moist.  Eyes: EOM are normal.  Neck: Trachea normal, normal range of motion and full passive range of motion without pain. Neck supple. No JVD present. Carotid bruit is not present. No thyromegaly present.  Cardiovascular: Normal rate, regular rhythm, normal heart sounds and intact distal pulses.  Exam reveals no gallop and no friction rub.   No murmur heard. Pulmonary/Chest: Effort normal and  breath sounds normal.  Abdominal: Soft. Bowel sounds are normal. She exhibits no distension and no mass. There is no tenderness.  Musculoskeletal: Normal range of motion.  Lymphadenopathy:    She has no cervical adenopathy.  Neurological: She is alert and oriented to person, place, and time. She has normal reflexes.  Skin: Skin is warm and dry.  Psychiatric: She has a normal mood and affect. Her behavior is normal. Judgment and thought content normal.    BP 125/81 mmHg  Pulse 97  Temp(Src) 96.9 F (36.1 C) (Oral)  Ht 5' 5" (1.651 m)  Wt 181 lb 3.2 oz (82.192 kg)  BMI 30.15 kg/m2        Assessment & Plan:  1. Hyperlipidemia with target LDL less than 100 Low fta diet - CMP14+EGFR - Lipid panel - simvastatin (ZOCOR) 40 MG tablet; Take 1 tablet (40 mg total) by mouth at bedtime.  Dispense: 90 tablet; Refill: 3  2. Urinary frequency - POCT UA - Microscopic Only - POCT urinalysis dipstick  3. Cystitis Force fluids Motrin or tylenol OTC OTC decongestant Throat lozenges if help New toothbrush in 3 days - nitrofurantoin, macrocrystal-monohydrate, (MACROBID) 100 MG capsule; Take 1 capsule (100 mg total) by mouth 2 (two) times daily. 1 po BId  Dispense: 14 capsule; Refill: 0    Labs pending Health maintenance reviewed Diet and exercise encouraged Continue all meds Follow up  In 6 months   Mary-Margaret Martin, FNP    

## 2015-08-18 NOTE — Patient Instructions (Signed)

## 2015-08-19 LAB — CMP14+EGFR
A/G RATIO: 2 (ref 1.1–2.5)
ALBUMIN: 4.5 g/dL (ref 3.5–5.5)
ALK PHOS: 115 IU/L (ref 39–117)
ALT: 12 IU/L (ref 0–32)
AST: 11 IU/L (ref 0–40)
BILIRUBIN TOTAL: 0.7 mg/dL (ref 0.0–1.2)
BUN / CREAT RATIO: 12 (ref 9–23)
BUN: 8 mg/dL (ref 6–24)
CO2: 20 mmol/L (ref 18–29)
CREATININE: 0.68 mg/dL (ref 0.57–1.00)
Calcium: 10.3 mg/dL — ABNORMAL HIGH (ref 8.7–10.2)
Chloride: 103 mmol/L (ref 96–106)
GFR calc Af Amer: 125 mL/min/{1.73_m2} (ref >59)
GFR calc non Af Amer: 108 mL/min/{1.73_m2} (ref >59)
GLUCOSE: 91 mg/dL (ref 65–99)
Globulin, Total: 2.2 g/dL (ref 1.5–4.5)
POTASSIUM: 4.5 mmol/L (ref 3.5–5.2)
SODIUM: 139 mmol/L (ref 134–144)
Total Protein: 6.7 g/dL (ref 6.0–8.5)

## 2015-08-19 LAB — LIPID PANEL
CHOLESTEROL TOTAL: 127 mg/dL (ref 100–199)
Chol/HDL Ratio: 3.3 ratio units (ref 0.0–4.4)
HDL: 39 mg/dL — ABNORMAL LOW (ref >39)
LDL CALC: 72 mg/dL (ref 0–99)
Triglycerides: 82 mg/dL (ref 0–149)
VLDL Cholesterol Cal: 16 mg/dL (ref 5–40)

## 2015-08-20 LAB — URINE CULTURE

## 2015-08-23 NOTE — Progress Notes (Signed)
Patient aware.

## 2015-09-01 ENCOUNTER — Ambulatory Visit (INDEPENDENT_AMBULATORY_CARE_PROVIDER_SITE_OTHER): Payer: BLUE CROSS/BLUE SHIELD | Admitting: *Deleted

## 2015-09-01 DIAGNOSIS — Z308 Encounter for other contraceptive management: Secondary | ICD-10-CM | POA: Diagnosis not present

## 2015-09-01 MED ORDER — MEDROXYPROGESTERONE ACETATE 150 MG/ML IM SUSP
150.0000 mg | INTRAMUSCULAR | Status: AC
  Administered 2015-09-01 – 2016-05-24 (×4): 150 mg via INTRAMUSCULAR

## 2015-09-01 NOTE — Patient Instructions (Signed)

## 2015-09-01 NOTE — Progress Notes (Signed)
DepoProvera given and patient tolerated well.  

## 2015-11-16 ENCOUNTER — Other Ambulatory Visit: Payer: Self-pay | Admitting: Nurse Practitioner

## 2015-11-29 ENCOUNTER — Ambulatory Visit (INDEPENDENT_AMBULATORY_CARE_PROVIDER_SITE_OTHER): Payer: BLUE CROSS/BLUE SHIELD | Admitting: *Deleted

## 2015-11-29 DIAGNOSIS — Z308 Encounter for other contraceptive management: Secondary | ICD-10-CM

## 2015-11-29 DIAGNOSIS — Z3042 Encounter for surveillance of injectable contraceptive: Secondary | ICD-10-CM

## 2015-11-29 NOTE — Progress Notes (Signed)
Pt given Depo-Provera 150mg  IM LUOQ and tolerated well.

## 2016-02-03 ENCOUNTER — Encounter: Payer: Self-pay | Admitting: Nurse Practitioner

## 2016-02-03 ENCOUNTER — Ambulatory Visit (INDEPENDENT_AMBULATORY_CARE_PROVIDER_SITE_OTHER): Payer: BLUE CROSS/BLUE SHIELD | Admitting: Nurse Practitioner

## 2016-02-03 VITALS — BP 109/69 | HR 81 | Temp 97.1°F | Ht 65.0 in | Wt 181.0 lb

## 2016-02-03 DIAGNOSIS — R300 Dysuria: Secondary | ICD-10-CM | POA: Diagnosis not present

## 2016-02-03 DIAGNOSIS — N3000 Acute cystitis without hematuria: Secondary | ICD-10-CM

## 2016-02-03 LAB — URINALYSIS
Bilirubin, UA: NEGATIVE
Glucose, UA: NEGATIVE
KETONES UA: NEGATIVE
NITRITE UA: NEGATIVE
PH UA: 5.5 (ref 5.0–7.5)
Specific Gravity, UA: >1.03 — ABNORMAL HIGH (ref 1.005–1.030)
UUROB: 1 mg/dL (ref 0.2–1.0)

## 2016-02-03 MED ORDER — NITROFURANTOIN MONOHYD MACRO 100 MG PO CAPS: 100.0000 mg | ORAL_CAPSULE | Freq: Two times a day (BID) | ORAL | 0 refills | Status: DC

## 2016-02-03 NOTE — Progress Notes (Signed)
[id number] Hassell Done, FNP Chief Complaint  Patient presents with  . Dysuria    Current Issues:  Presents with 14 days of dysuria, urinary urgency and urinary frequency Associated symptoms include:  urinary hesitancy  There is a previous history of of similar symptoms. Sexually active:  Yes with female.   No concern for STI.  Prior to Admission medications   Medication Sig Start Date End Date Taking? Authorizing Provider  simvastatin (ZOCOR) 40 MG tablet Take 1 tablet (40 mg total) by mouth at bedtime. 08/18/15  Yes Mary-Margaret Hassell Done, FNP    Review of Systems:no fever, nausea or vomiing. No back pain.  PE:  BP 109/69   Pulse 81   Temp 97.1 F (36.2 C) (Oral)   Ht 5\' 5"  (1.651 m)   Wt 181 lb (82.1 kg)   BMI 30.12 kg/m  Constitutional- alert and oriented Heart-RRR Lungs- clear in all fields Back- no problems Abdomen- mild suprapubic pain on palpation- no CVA tenderness Pelvic- as stated previously- mild suprapubic pain on palpation  UA- trace of leuks- not enough urine for mirco or culture.   Assessment and Plan:  1. Dysuria Acute cystitis - Urinalysis, Complete Meds ordered this encounter  Medications  . nitrofurantoin, macrocrystal-monohydrate, (MACROBID) 100 MG capsule    Sig: Take 1 capsule (100 mg total) by mouth 2 (two) times daily. 1 po BId    Dispense:  14 capsule    Refill:  0    Order Specific Question:   Supervising Provider    Answer:   Evette Doffing, CAROL L [[id number]]   Take medication as prescribe Cotton underwear Take shower not bath Cranberry juice, yogurt Force fluids AZO over the counter X2 days Culture pending RTO prn  Mary-Margaret Hassell Done, FNP

## 2016-02-03 NOTE — Patient Instructions (Addendum)
Take medication as prescribe Cotton underwear Take shower not bath Cranberry juice, yogurt Force fluids AZO over the counter X2 days Culture pending RTO prn     Urinary Tract Infection Urinary tract infections (UTIs) can develop anywhere along your urinary tract. Your urinary tract is your body's drainage system for removing wastes and extra water. Your urinary tract includes two kidneys, two ureters, a bladder, and a urethra. Your kidneys are a pair of bean-shaped organs. Each kidney is about the size of your fist. They are located below your ribs, one on each side of your spine. CAUSES Infections are caused by microbes, which are microscopic organisms, including fungi, viruses, and bacteria. These organisms are so small that they can only be seen through a microscope. Bacteria are the microbes that most commonly cause UTIs. SYMPTOMS  Symptoms of UTIs may vary by age and gender of the patient and by the location of the infection. Symptoms in young women typically include a frequent and intense urge to urinate and a painful, burning feeling in the bladder or urethra during urination. Older women and men are more likely to be tired, shaky, and weak and have muscle aches and abdominal pain. A fever may mean the infection is in your kidneys. Other symptoms of a kidney infection include pain in your back or sides below the ribs, nausea, and vomiting. DIAGNOSIS To diagnose a UTI, your caregiver will ask you about your symptoms. Your caregiver will also ask you to provide a urine sample. The urine sample will be tested for bacteria and white blood cells. White blood cells are made by your body to help fight infection. TREATMENT  Typically, UTIs can be treated with medication. Because most UTIs are caused by a bacterial infection, they usually can be treated with the use of antibiotics. The choice of antibiotic and length of treatment depend on your symptoms and the type of bacteria causing your  infection. HOME CARE INSTRUCTIONS  If you were prescribed antibiotics, take them exactly as your caregiver instructs you. Finish the medication even if you feel better after you have only taken some of the medication.  Drink enough water and fluids to keep your urine clear or pale yellow.  Avoid caffeine, tea, and carbonated beverages. They tend to irritate your bladder.  Empty your bladder often. Avoid holding urine for long periods of time.  Empty your bladder before and after sexual intercourse.  After a bowel movement, women should cleanse from front to back. Use each tissue only once. SEEK MEDICAL CARE IF:   You have back pain.  You develop a fever.  Your symptoms do not begin to resolve within 3 days. SEEK IMMEDIATE MEDICAL CARE IF:   You have severe back pain or lower abdominal pain.  You develop chills.  You have nausea or vomiting.  You have continued burning or discomfort with urination. MAKE SURE YOU:   Understand these instructions.  Will watch your condition.  Will get help right away if you are not doing well or get worse.   This information is not intended to replace advice given to you by your health care provider. Make sure you discuss any questions you have with your health care provider.   Document Released: 03/15/2005 Document Revised: 02/24/2015 Document Reviewed: 07/14/2011 Elsevier Interactive Patient Education [employer].

## 2016-02-03 NOTE — Addendum Note (Signed)
Addended by: Rolena Infante on: 02/03/2016 10:05 AM   Modules accepted: Orders

## 2016-02-15 ENCOUNTER — Telehealth: Payer: Self-pay | Admitting: Nurse Practitioner

## 2016-02-15 ENCOUNTER — Other Ambulatory Visit: Payer: Self-pay | Admitting: Nurse Practitioner

## 2016-03-01 ENCOUNTER — Ambulatory Visit (INDEPENDENT_AMBULATORY_CARE_PROVIDER_SITE_OTHER): Payer: BLUE CROSS/BLUE SHIELD | Admitting: *Deleted

## 2016-03-01 DIAGNOSIS — Z308 Encounter for other contraceptive management: Secondary | ICD-10-CM | POA: Diagnosis not present

## 2016-03-01 DIAGNOSIS — Z3042 Encounter for surveillance of injectable contraceptive: Secondary | ICD-10-CM

## 2016-03-01 NOTE — Progress Notes (Signed)
Pt given Depo-Provera Tolerated well

## 2016-05-15 ENCOUNTER — Other Ambulatory Visit: Payer: Self-pay | Admitting: Nurse Practitioner

## 2016-05-24 ENCOUNTER — Ambulatory Visit (INDEPENDENT_AMBULATORY_CARE_PROVIDER_SITE_OTHER): Payer: BLUE CROSS/BLUE SHIELD | Admitting: *Deleted

## 2016-05-24 DIAGNOSIS — Z3042 Encounter for surveillance of injectable contraceptive: Secondary | ICD-10-CM

## 2016-05-24 DIAGNOSIS — Z308 Encounter for other contraceptive management: Secondary | ICD-10-CM

## 2016-05-24 NOTE — Progress Notes (Signed)
Pt given Medroxyprogesterone inj Tolerated well 

## 2016-08-02 ENCOUNTER — Telehealth: Payer: Self-pay | Admitting: Nurse Practitioner

## 2016-08-11 ENCOUNTER — Other Ambulatory Visit: Payer: Self-pay | Admitting: Nurse Practitioner

## 2016-08-11 DIAGNOSIS — E785 Hyperlipidemia, unspecified: Secondary | ICD-10-CM

## 2016-08-12 ENCOUNTER — Other Ambulatory Visit: Payer: Self-pay | Admitting: Nurse Practitioner

## 2016-08-15 NOTE — Telephone Encounter (Signed)
Noted in apt notes for pt to schedule for mammogram when she comes in for her next apt

## 2016-08-17 ENCOUNTER — Ambulatory Visit (INDEPENDENT_AMBULATORY_CARE_PROVIDER_SITE_OTHER): Payer: BLUE CROSS/BLUE SHIELD | Admitting: *Deleted

## 2016-08-17 DIAGNOSIS — Z23 Encounter for immunization: Secondary | ICD-10-CM | POA: Diagnosis not present

## 2016-08-17 DIAGNOSIS — Z3042 Encounter for surveillance of injectable contraceptive: Secondary | ICD-10-CM

## 2016-08-17 MED ORDER — MEDROXYPROGESTERONE ACETATE 150 MG/ML IM SUSP
150.0000 mg | Freq: Once | INTRAMUSCULAR | Status: AC
  Administered 2016-08-17: 150 mg via INTRAMUSCULAR

## 2016-08-17 NOTE — Progress Notes (Signed)
Pt given Medroxyprogesterone inj Also given Influenza vaccine Pt tolerated well

## 2016-11-03 ENCOUNTER — Other Ambulatory Visit: Payer: Self-pay | Admitting: Nurse Practitioner

## 2016-11-08 ENCOUNTER — Other Ambulatory Visit: Payer: Self-pay | Admitting: Nurse Practitioner

## 2016-11-08 DIAGNOSIS — E785 Hyperlipidemia, unspecified: Secondary | ICD-10-CM

## 2016-11-08 NOTE — Telephone Encounter (Signed)
Last labs 03/17

## 2016-11-09 ENCOUNTER — Encounter: Payer: Self-pay | Admitting: Nurse Practitioner

## 2016-11-09 ENCOUNTER — Ambulatory Visit (INDEPENDENT_AMBULATORY_CARE_PROVIDER_SITE_OTHER): Payer: BLUE CROSS/BLUE SHIELD | Admitting: Nurse Practitioner

## 2016-11-09 VITALS — BP 124/66 | HR 76 | Temp 96.8°F | Ht 65.0 in | Wt 189.0 lb

## 2016-11-09 DIAGNOSIS — Z01419 Encounter for gynecological examination (general) (routine) without abnormal findings: Secondary | ICD-10-CM

## 2016-11-09 DIAGNOSIS — E785 Hyperlipidemia, unspecified: Secondary | ICD-10-CM

## 2016-11-09 DIAGNOSIS — Z3042 Encounter for surveillance of injectable contraceptive: Secondary | ICD-10-CM

## 2016-11-09 DIAGNOSIS — Z0000 Encounter for general adult medical examination without abnormal findings: Secondary | ICD-10-CM

## 2016-11-09 DIAGNOSIS — K219 Gastro-esophageal reflux disease without esophagitis: Secondary | ICD-10-CM

## 2016-11-09 LAB — MICROSCOPIC EXAMINATION: Renal Epithel, UA: NONE SEEN /hpf

## 2016-11-09 LAB — URINALYSIS, COMPLETE
Bilirubin, UA: NEGATIVE
GLUCOSE, UA: NEGATIVE
Leukocytes, UA: NEGATIVE
NITRITE UA: NEGATIVE
Protein, UA: NEGATIVE
SPEC GRAV UA: 1.025 (ref 1.005–1.030)
UUROB: 1 mg/dL (ref 0.2–1.0)
pH, UA: 6 (ref 5.0–7.5)

## 2016-11-09 MED ORDER — MEDROXYPROGESTERONE ACETATE 150 MG/ML IM SUSP
150.0000 mg | Freq: Once | INTRAMUSCULAR | Status: AC
  Administered 2016-11-09: 150 mg via INTRAMUSCULAR

## 2016-11-09 MED ORDER — MEDROXYPROGESTERONE ACETATE 150 MG/ML IM SUSP: INTRAMUSCULAR | 3 refills | Status: DC

## 2016-11-09 MED ORDER — SIMVASTATIN 40 MG PO TABS: ORAL_TABLET | ORAL | 1 refills | Status: DC

## 2016-11-09 NOTE — Patient Instructions (Signed)
Fat and Cholesterol Restricted Diet Getting too much fat and cholesterol in your diet may cause health problems. Following this diet helps keep your fat and cholesterol at normal levels. This can keep you from getting sick. What types of fat should I choose?  Choose monosaturated and polyunsaturated fats. These are found in foods such as olive oil, canola oil, flaxseeds, walnuts, almonds, and seeds.  Eat more omega-3 fats. Good choices include salmon, mackerel, sardines, tuna, flaxseed oil, and ground flaxseeds.  Limit saturated fats. These are in animal products such as meats, butter, and cream. They can also be in plant products such as palm oil, palm kernel oil, and coconut oil.  Avoid foods with partially hydrogenated oils in them. These contain trans fats. Examples of foods that have trans fats are stick margarine, some tub margarines, cookies, crackers, and other baked goods. What general guidelines do I need to follow?  Check food labels. Look for the words "trans fat" and "saturated fat."  When preparing a meal:  Fill half of your plate with vegetables and green salads.  Fill one fourth of your plate with whole grains. Look for the word "whole" as the first word in the ingredient list.  Fill one fourth of your plate with lean protein foods.  Eat more foods that have fiber, like apples, carrots, beans, peas, and barley.  Eat more home-cooked foods. Eat less at restaurants and buffets.  Limit or avoid alcohol.  Limit foods high in starch and sugar.  Limit fried foods.  Cook foods without frying them. Baking, boiling, grilling, and broiling are all great options.  Lose weight if you are overweight. Losing even a small amount of weight can help your overall health. It can also help prevent diseases such as diabetes and heart disease. What foods can I eat? Grains  Whole grains, such as whole wheat or whole grain breads, crackers, cereals, and pasta. Unsweetened oatmeal,  bulgur, barley, quinoa, or brown rice. Corn or whole wheat flour tortillas. Vegetables  Fresh or frozen vegetables (raw, steamed, roasted, or grilled). Green salads. Fruits  All fresh, canned (in natural juice), or frozen fruits. Meat and Other Protein Products  Ground beef (85% or leaner), grass-fed beef, or beef trimmed of fat. Skinless chicken or turkey. Ground chicken or turkey. Pork trimmed of fat. All fish and seafood. Eggs. Dried beans, peas, or lentils. Unsalted nuts or seeds. Unsalted canned or dry beans. Dairy  Low-fat dairy products, such as skim or 1% milk, 2% or reduced-fat cheeses, low-fat ricotta or cottage cheese, or plain low-fat yogurt. Fats and Oils  Tub margarines without trans fats. Light or reduced-fat mayonnaise and salad dressings. Avocado. Olive, canola, sesame, or safflower oils. Natural peanut or almond butter (choose ones without added sugar and oil). The items listed above may not be a complete list of recommended foods or beverages. Contact your dietitian for more options.  What foods are not recommended? Grains  White bread. White pasta. White rice. Cornbread. Bagels, pastries, and croissants. Crackers that contain trans fat. Vegetables  White potatoes. Corn. Creamed or fried vegetables. Vegetables in a cheese sauce. Fruits  Dried fruits. Canned fruit in light or heavy syrup. Fruit juice. Meat and Other Protein Products  Fatty cuts of meat. Ribs, chicken wings, bacon, sausage, bologna, salami, chitterlings, fatback, hot dogs, bratwurst, and packaged luncheon meats. Liver and organ meats. Dairy  Whole or 2% milk, cream, half-and-half, and cream cheese. Whole milk cheeses. Whole-fat or sweetened yogurt. Full-fat cheeses. Nondairy creamers and whipped   toppings. Processed cheese, cheese spreads, or cheese curds. Sweets and Desserts  Corn syrup, sugars, honey, and molasses. Candy. Jam and jelly. Syrup. Sweetened cereals. Cookies, pies, cakes, donuts, muffins, and ice  cream. Fats and Oils  Butter, stick margarine, lard, shortening, ghee, or bacon fat. Coconut, palm kernel, or palm oils. Beverages  Alcohol. Sweetened drinks (such as sodas, lemonade, and fruit drinks or punches). The items listed above may not be a complete list of foods and beverages to avoid. Contact your dietitian for more information.  This information is not intended to replace advice given to you by your health care provider. Make sure you discuss any questions you have with your health care provider. Document Released: 12/05/2011 Document Revised: 02/10/2016 Document Reviewed: 09/04/2013 Elsevier Interactive Patient Education  2017 Elsevier Inc.  

## 2016-11-09 NOTE — Progress Notes (Signed)
Subjective:    Patient ID: Laurie Kelley, female    DOB: 03/06/1971, 46 y.o.   MRN: [id number]  HPI  Esteen L Gal is here today for follow up of chronic medical problem.  Outpatient Encounter Prescriptions as of 11/09/2016  Medication Sig  . medroxyPROGESTERone (DEPO-PROVERA) 150 MG/ML injection INJECT 150MG  INTRAMUSCULARLY EVERY 3 MONTHS AS INSTRUCTED  . simvastatin (ZOCOR) 40 MG tablet TAKE 1 TABLET (40 MG TOTAL) BY MOUTH AT BEDTIME. MUST HAVE APPT FOR REFILLS     1. Annual physical exam   2. Gynecologic exam normal   3. Hyperlipidemia with target LDL less than 100  Tries to watch diet  4. Gastroesophageal reflux disease, esophagitis presence not specified  Currently just takes TUMS when se needs it  5. Encounter for surveillance of injectable contraceptive  Has been on depo for many years- no sie effects    New complaints: None today     Review of Systems  Constitutional: Negative for diaphoresis.  Eyes: Negative for pain.  Respiratory: Negative for shortness of breath.   Cardiovascular: Negative for chest pain, palpitations and leg swelling.  Gastrointestinal: Negative for abdominal pain.  Endocrine: Negative for polydipsia.  Skin: Negative for rash.  Neurological: Negative for dizziness, weakness and headaches.  Hematological: Does not bruise/bleed easily.       Objective:   Physical Exam  Constitutional: She is oriented to person, place, and time. She appears well-developed and well-nourished.  HENT:  Head: Normocephalic.  Right Ear: Hearing, tympanic membrane, external ear and ear canal normal.  Left Ear: Hearing, tympanic membrane, external ear and ear canal normal.  Nose: Nose normal.  Mouth/Throat: Uvula is midline and oropharynx is clear and moist.  Eyes: Conjunctivae and EOM are normal. Pupils are equal, round, and reactive to light.  Neck: Normal range of motion and full passive range of motion without pain. Neck supple. No JVD present. Carotid  bruit is not present. No thyroid mass and no thyromegaly present.  Cardiovascular: Normal rate, normal heart sounds and intact distal pulses.   No murmur heard. Pulmonary/Chest: Effort normal and breath sounds normal. Right breast exhibits no inverted nipple, no mass, no nipple discharge, no skin change and no tenderness. Left breast exhibits no inverted nipple, no mass, no nipple discharge, no skin change and no tenderness.  Abdominal: Soft. Bowel sounds are normal. She exhibits no mass. There is no tenderness.  Genitourinary: Vagina normal and uterus normal. No breast swelling, tenderness, discharge or bleeding. No vaginal discharge found.  Genitourinary Comments: bimanual exam-No adnexal masses or tenderness. Cervix parous and pink  Musculoskeletal: Normal range of motion.  Lymphadenopathy:    She has no cervical adenopathy.  Neurological: She is alert and oriented to person, place, and time.  Skin: Skin is warm and dry.  Psychiatric: She has a normal mood and affect. Her behavior is normal. Judgment and thought content normal.   BP 124/66   Pulse 76   Temp (!) 96.8 F (36 C) (Oral)   Ht 5\' 5"  (1.651 m)   Wt 189 lb (85.7 kg)   BMI 31.45 kg/m       Assessment & Plan:  1. Annual physical exam - Urinalysis, Complete  2. Gynecologic exam normal - IGP, Aptima HPV, rfx 16/18,45  3. Hyperlipidemia with target LDL less than 100 Low fat diet - simvastatin (ZOCOR) 40 MG tablet; TAKE 1 TABLET (40 MG TOTAL) BY MOUTH AT BEDTIME. MUST HAVE APPT FOR REFILLS  Dispense: 90 tablet; Refill: 1  4. Gastroesophageal reflux disease, esophagitis presence not specified Avoid spicy foods Do not eat 2 hours prior to bedtime  5. Encounter for surveillance of injectable contraceptive - medroxyPROGESTERone (DEPO-PROVERA) 150 MG/ML injection; INJECT 150MG  INTRAMUSCULARLY EVERY 3 MONTHS AS INSTRUCTED  Dispense: 1 mL; Refill: 3    Labs pending Health maintenance reviewed Diet and exercise  encouraged Continue all meds Follow up  In 1 year   Sour John, FNP

## 2016-11-09 NOTE — Addendum Note (Signed)
Addended by: Rolena Infante on: 11/09/2016 11:48 AM   Modules accepted: Orders

## 2016-11-12 LAB — IGP, APTIMA HPV, RFX 16/18,45
HPV APTIMA: NEGATIVE
PAP Smear Comment: 0

## 2017-01-27 ENCOUNTER — Other Ambulatory Visit: Payer: Self-pay | Admitting: Family Medicine

## 2017-02-01 ENCOUNTER — Ambulatory Visit (INDEPENDENT_AMBULATORY_CARE_PROVIDER_SITE_OTHER): Payer: Commercial Managed Care - PPO | Admitting: *Deleted

## 2017-02-01 DIAGNOSIS — Z3042 Encounter for surveillance of injectable contraceptive: Secondary | ICD-10-CM

## 2017-02-01 MED ORDER — MEDROXYPROGESTERONE ACETATE 150 MG/ML IM SUSP
150.0000 mg | INTRAMUSCULAR | Status: DC
  Administered 2017-02-01 – 2017-10-10 (×4): 150 mg via INTRAMUSCULAR

## 2017-02-01 NOTE — Progress Notes (Signed)
Pt given Medroxyprogesterone inj Tolerated well 

## 2017-04-16 ENCOUNTER — Telehealth: Payer: Self-pay | Admitting: Nurse Practitioner

## 2017-04-16 NOTE — Telephone Encounter (Signed)
appt scheduled Pt notified 

## 2017-04-20 ENCOUNTER — Ambulatory Visit (INDEPENDENT_AMBULATORY_CARE_PROVIDER_SITE_OTHER): Payer: Commercial Managed Care - PPO

## 2017-04-20 ENCOUNTER — Encounter: Payer: Self-pay | Admitting: Nurse Practitioner

## 2017-04-20 ENCOUNTER — Ambulatory Visit (INDEPENDENT_AMBULATORY_CARE_PROVIDER_SITE_OTHER): Payer: Commercial Managed Care - PPO | Admitting: Nurse Practitioner

## 2017-04-20 VITALS — BP 120/78 | HR 74 | Temp 98.4°F | Ht 65.0 in | Wt 188.0 lb

## 2017-04-20 DIAGNOSIS — M25511 Pain in right shoulder: Secondary | ICD-10-CM | POA: Diagnosis not present

## 2017-04-20 DIAGNOSIS — M7551 Bursitis of right shoulder: Secondary | ICD-10-CM | POA: Diagnosis not present

## 2017-04-20 DIAGNOSIS — Z3042 Encounter for surveillance of injectable contraceptive: Secondary | ICD-10-CM | POA: Diagnosis not present

## 2017-04-20 MED ORDER — METHYLPREDNISOLONE ACETATE 40 MG/ML IJ SUSP
40.0000 mg | Freq: Once | INTRAMUSCULAR | Status: AC
  Administered 2017-04-20: 40 mg via INTRAMUSCULAR

## 2017-04-20 MED ORDER — BUPIVACAINE HCL 0.25 % IJ SOLN
1.0000 mL | Freq: Once | INTRAMUSCULAR | Status: AC
  Administered 2017-04-20: 1 mL via INTRA_ARTICULAR

## 2017-04-20 NOTE — Progress Notes (Signed)
   Subjective:    Patient ID: Laurie Kelley, female    DOB: Nov 16, 1970, 46 y.o.   MRN: [id number]  HPI Patient comes in today c/o right shoulder pain. Started about 1 month ago and pain is increasing. Help er daughter move furniture and then helped clean up a fallen tree andd has ben hurting since. Rates pain 8-10. movement increase pain. Holding it still decreases pan some but has throbbing pain. Has had this in past and cortisone injection really helped.  Review of Systems  Constitutional: Negative.   Respiratory: Negative.   Cardiovascular: Negative.   Musculoskeletal:       Right shoulder pain  Neurological: Negative.   Psychiatric/Behavioral: Negative.   All other systems reviewed and are negative.      Objective:   Physical Exam  Constitutional: She is oriented to person, place, and time. She appears well-developed and well-nourished. No distress.  Cardiovascular: Normal rate and regular rhythm.   Pulmonary/Chest: Effort normal and breath sounds normal.  Musculoskeletal:  FROM of right shoulder with pan on extension and internal rotation Grips equal bil Motor strength and sensation distally intact  Neurological: She is alert and oriented to person, place, and time.  Skin: Skin is warm.  Psychiatric: She has a normal mood and affect. Her behavior is normal. Judgment and thought content normal.   BP 120/78   Pulse 74   Temp 98.4 F (36.9 C) (Oral)   Ht 5\' 5"  (1.651 m)   Wt 188 lb (85.3 kg)   BMI 31.28 kg/m   Procedure: betadine skin prep        Marcaine 0.25 17ml with deopmedrol 40mg /ml 76ml injected in right shoulder with 21g11/2 needle- patient tolerated well     Assessment & Plan:   1. Acute pain of right shoulder   2. Bursitis of right shoulder    shoulder injection Stretching exercises Do not over use for 1 week RTO prn  Mary-Margaret Hassell Done, FNP

## 2017-04-20 NOTE — Patient Instructions (Signed)
Facet Joint Block, Care After Refer to this sheet in the next few weeks. These instructions provide you with information about caring for yourself after your procedure. Your health care provider may also give you more specific instructions. Your treatment has been planned according to current medical practices, but problems sometimes occur. Call your health care provider if you have any problems or questions after your procedure. What can I expect after the procedure? After the procedure, it is common to have:  Some tenderness over the injection sites for 2 days after the procedure.  A temporary increase in blood sugar if you have diabetes.  Follow these instructions at home:  Keep track of the amount of pain relief you feel and how long it lasts.  Take over-the-counter and prescription medicines only as told by your health care provider. You may need to limit pain medicine within the first 4-6 hours after the procedure.  Remove your bandages (dressings) the morning after the procedure.  For the first 24 hours after the procedure: ? Do not apply heat near or over the injection sites. ? Do not take a bath or soak in water, such as in a pool or lake. ? Do not drive or operate heavy machinery unless approved by your health care provider. ? Avoid activities that require a lot of energy.  If the injection site is tender, try applying ice to the area. To do this: ? Put ice in a plastic bag. ? Place a towel between your skin and the bag. ? Leave the ice on for 20 minutes, 2-3 times a day.  Keep all follow-up visits as told by your health care provider. This is important. Contact a health care provider if:  Fluid is coming from an injection site.  There is significant bleeding or swelling at an injection site.  You have diabetes and your blood sugar is above 180 mg/dL. Get help right away if:  You have a fever.  You have worsening pain or swelling around an injection site.  There  are red streaks around an injection site.  You develop severe pain that is not controlled by your medicines.  You develop a headache, stiff neck, nausea, or vomiting.  Your eyes become very sensitive to light.  You have weakness, paralysis, or tingling in your arms or legs that was not present before the procedure.  You have difficulty urinating or breathing. This information is not intended to replace advice given to you by your health care provider. Make sure you discuss any questions you have with your health care provider. Document Released: 05/22/2012 Document Revised: 10/20/2015 Document Reviewed: 03/01/2015 Elsevier Interactive Patient Education  2018 Elsevier Inc.  

## 2017-07-09 ENCOUNTER — Ambulatory Visit (INDEPENDENT_AMBULATORY_CARE_PROVIDER_SITE_OTHER): Payer: Commercial Managed Care - PPO | Admitting: Family

## 2017-07-09 ENCOUNTER — Encounter: Payer: Self-pay | Admitting: Family

## 2017-07-09 VITALS — BP 133/72 | HR 81 | Temp 99.2°F

## 2017-07-09 DIAGNOSIS — F411 Generalized anxiety disorder: Secondary | ICD-10-CM

## 2017-07-09 DIAGNOSIS — R0789 Other chest pain: Secondary | ICD-10-CM

## 2017-07-09 NOTE — Progress Notes (Addendum)
   Subjective:    Patient ID: Laurie Kelley, female    DOB: 02-15-1971, 47 y.o.   MRN: [id number]  Pt presents to the office today with chest pain. States she has been under a great deal of stress with her family.  Chest Pain   This is a new problem. The current episode started 1 to 4 weeks ago. The onset quality is sudden. The problem occurs constantly. The problem has been waxing and waning. The pain is present in the substernal region. The pain is at a severity of 7/10. The pain is moderate. The quality of the pain is described as tightness. The pain does not radiate. Associated symptoms include back pain and palpitations. Pertinent negatives include no cough, diaphoresis, fever, lower extremity edema, malaise/fatigue, shortness of breath or vomiting. The pain is aggravated by movement. She has tried nothing for the symptoms. The treatment provided no relief.  Her family medical history is significant for heart disease.      Review of Systems  Constitutional: Negative for diaphoresis, fever and malaise/fatigue.  Respiratory: Negative for cough and shortness of breath.   Cardiovascular: Positive for chest pain and palpitations.  Gastrointestinal: Negative for vomiting.  Musculoskeletal: Positive for back pain.  All other systems reviewed and are negative.      Objective:   Physical Exam  Constitutional: She is oriented to person, place, and time. She appears well-developed and well-nourished. No distress.  HENT:  Head: Normocephalic and atraumatic.  Right Ear: External ear normal.  Mouth/Throat: Oropharynx is clear and moist.  Eyes: Pupils are equal, round, and reactive to light.  Neck: Normal range of motion. Neck supple. No thyromegaly present.  Cardiovascular: Normal rate, regular rhythm, normal heart sounds and intact distal pulses.  No murmur heard. Pulmonary/Chest: Effort normal and breath sounds normal. No respiratory distress. She has no wheezes.  Abdominal: Soft. Bowel  sounds are normal. She exhibits no distension. There is no tenderness.  Musculoskeletal: Normal range of motion. She exhibits no edema or tenderness.  Neurological: She is alert and oriented to person, place, and time.  Skin: Skin is warm and dry.  Psychiatric: Her behavior is normal. Judgment and thought content normal. Her mood appears anxious.  Vitals reviewed.   BP 133/72   Pulse 81   Temp 99.2 F (37.3 C) (Oral)   SpO2 100%      Assessment & Plan:  1. Other chest pain - EKG 12-Lead - Ambulatory referral to Cardiology  2. GAD (generalized anxiety disorder) Long discussion about anxiety and discussed starting lexapro. Pt states she dose not want to start any medication right now.  Will do referral to stat Cardiologists to rule out any  PT looks stable at this time Discussed if chest pain worsens or changes needs to go to ED    Evelina Dun, FNP

## 2017-07-09 NOTE — Patient Instructions (Signed)
Chest Wall Pain °Chest wall pain is pain in or around the bones and muscles of your chest. Sometimes, an injury causes this pain. Sometimes, the cause may not be known. This pain may take several weeks or longer to get better. °Follow these instructions at home: °Pay attention to any changes in your symptoms. Take these actions to help with your pain: °· Rest as told by your health care provider. °· Avoid activities that cause pain. These include any activities that use your chest muscles or your abdominal and side muscles to lift heavy items. °· If directed, apply ice to the painful area: °? Put ice in a plastic bag. °? Place a towel between your skin and the bag. °? Leave the ice on for 20 minutes, 2-3 times per day. °· Take over-the-counter and prescription medicines only as told by your health care provider. °· Do not use tobacco products, including cigarettes, chewing tobacco, and e-cigarettes. If you need help quitting, ask your health care provider. °· Keep all follow-up visits as told by your health care provider. This is important. ° °Contact a health care provider if: °· You have a fever. °· Your chest pain becomes worse. °· You have new symptoms. °Get help right away if: °· You have nausea or vomiting. °· You feel sweaty or light-headed. °· You have a cough with phlegm (sputum) or you cough up blood. °· You develop shortness of breath. °This information is not intended to replace advice given to you by your health care provider. Make sure you discuss any questions you have with your health care provider. °Document Released: 06/05/2005 Document Revised: 10/14/2015 Document Reviewed: 08/31/2014 °Elsevier Interactive Patient Education © 2018 [employer] ° °

## 2017-07-17 ENCOUNTER — Ambulatory Visit (INDEPENDENT_AMBULATORY_CARE_PROVIDER_SITE_OTHER): Payer: Commercial Managed Care - PPO | Admitting: *Deleted

## 2017-07-17 DIAGNOSIS — Z3042 Encounter for surveillance of injectable contraceptive: Secondary | ICD-10-CM

## 2017-07-17 NOTE — Progress Notes (Signed)
Pt given Medroxyprogesterone inj Tolerated well 

## 2017-07-31 ENCOUNTER — Other Ambulatory Visit: Payer: Self-pay | Admitting: *Deleted

## 2017-07-31 ENCOUNTER — Ambulatory Visit: Payer: BLUE CROSS/BLUE SHIELD | Admitting: Cardiology

## 2017-07-31 DIAGNOSIS — R079 Chest pain, unspecified: Secondary | ICD-10-CM

## 2017-07-31 DIAGNOSIS — E785 Hyperlipidemia, unspecified: Secondary | ICD-10-CM

## 2017-07-31 HISTORY — DX: Chest pain, unspecified: R07.9

## 2017-07-31 NOTE — Progress Notes (Signed)
Cardiology Office Note:    Date:  08/01/2017   ID:  Laurie Kelley, DOB 21-Nov-1970, MRN [id number]  PCP:  Chevis Pretty, FNP  Cardiologist:  Shirlee More, MD   Referring MD: Sharion Balloon, FNP  ASSESSMENT:    1. Chest pain in adult   2. Hyperlipidemia with target LDL less than 100   3. Low HDL (under 40)    PLAN:    In order of problems listed above:  1. Her symptoms and presentation are most consistent with chronic costochondral chest pain.  Unfortunately she has an increased cardiovascular risk with age and severe dyslipidemia elevated LDL and low HDL and for further evaluation will undergo coronary CTA.  If she has flow-limiting stenosis angiography and revascularization be appropriate.  In the interim she will continue lipid-lowering therapy and initiate a nonsteroidal anti-inflammatory drug and if unimproved consider physical therapy modalities. 2. She is having trouble tolerating simvastatin with muscle pain and weakness and was switched to rosuvastatin. 3. An increased cardiovascular risk with combined low HDL elevated LDL prompting further evaluation cardiac CTA.  Unfortunately at present there is no pharmacologic agent for treating low HDL  Next appointment   Medication Adjustments/Labs and Tests Ordered: Current medicines are reviewed at length with the patient today.  Concerns regarding medicines are outlined above.  Orders Placed This Encounter  Procedures  . CT CORONARY MORPH W/CTA COR W/SCORE W/CA W/CM &/OR WO/CM  . CT CORONARY FRACTIONAL FLOW RESERVE DATA PREP  . CT CORONARY FRACTIONAL FLOW RESERVE FLUID ANALYSIS  . EKG 12-Lead   Meds ordered this encounter  Medications  . celecoxib (CELEBREX) 200 MG capsule    Sig: Take 1 capsule (200 mg total) by mouth 2 (two) times daily.    Dispense:  60 capsule    Refill:  1  . rosuvastatin (CRESTOR) 10 MG tablet    Sig: Take 1 tablet (10 mg total) by mouth daily.    Dispense:  90 tablet    Refill:  3  .  metoprolol tartrate (LOPRESSOR) 50 MG tablet    Sig: Take 1 tablet (50 mg total) by mouth once for 1 dose. Take 1 tablet, 1 hour prior to cardiac CTA.    Dispense:  1 tablet    Refill:  0     Chief Complaint  Patient presents with  . Referral    to evaluate CP and SHOB  4 weeks  History of Present Illness:    Laurie Kelley is a 47 y.o. female who is being seen today for the evaluation of chest pain at the request of Sharion Balloon, FNP. She has a history of marked hyperlipidemia with both elevated LDL abnormal particle size and low HDL.  She relates about 10 years ago she had a stress test done in [place] myocardial perfusion that was normal I cannot access that record she had a chest CTA for pulmonary embolism about that time that was normal.  For the last several months she has been under increased stress in her personal work life and is noted chest discomfort.  Initially started at point localized to the right sternal area reproducible by touching.  The symptoms have worsened and now occur throughout the left and right chest worse when she does activity but not limiting and is somewhat better at rest but does not resolve.  The symptoms can wax and wane all day long.  Her EKG is normal she also says she has exertional shortness of breath and exercise  fatigue but not intolerance at this point.  She thinks she is not tolerating her statin because of muscle pain and weakness and had a recent injection of the right shoulder for bursitis.  She had a murmur as a child and echocardiogram performed as an adolescent that was normal.  She has no history rheumatic heart disease no palpitations syncope or TIA.  It is not pleuritic in nature she has no shortness of breath at rest and no associated GI symptoms.  She has had no chest trauma at work.  Past Medical History:  Diagnosis Date  . Chest pain in adult 07/31/2017  . Esophageal reflux 08/18/2014  . Gallstones   . Hyperlipidemia with target LDL  less than 100 08/18/2015  . Internal hemorrhoids 08/18/2014    Past Surgical History:  Procedure Laterality Date  . CHOLECYSTECTOMY      Current Medications: Current Meds  Medication Sig  . medroxyPROGESTERone (DEPO-PROVERA) 150 MG/ML injection INJECT 150MG  INTRAMUSCULARLY EVERY 3 MONTHS AS INSTRUCTED  . Pumpkin Seed-Soy Germ (AZO BLADDER CONTROL/GO-LESS) CAPS Take 1 capsule by mouth daily.  Marland Kitchen VITAMIN D, ERGOCALCIFEROL, PO Take 1 capsule by mouth daily.   . [DISCONTINUED] simvastatin (ZOCOR) 40 MG tablet TAKE 1 TABLET (40 MG TOTAL) BY MOUTH AT BEDTIME. MUST HAVE APPT FOR REFILLS   Current Facility-Administered Medications for the 08/01/17 encounter (Office Visit) with Richardo Priest, MD  Medication  . medroxyPROGESTERone (DEPO-PROVERA) injection 150 mg     Allergies:   Patient has no known allergies.   Social History   Socioeconomic History  . Marital status: Single    Spouse name: None  . Number of children: None  . Years of education: None  . Highest education level: None  Social Needs  . Financial resource strain: None  . Food insecurity - worry: None  . Food insecurity - inability: None  . Transportation needs - medical: None  . Transportation needs - non-medical: None  Occupational History  . None  Tobacco Use  . Smoking status: Never Smoker  . Smokeless tobacco: Never Used  Substance and Sexual Activity  . Alcohol use: No  . Drug use: No  . Sexual activity: None  Other Topics Concern  . None  Social History Narrative  . None     Family History: The patient's her father had valve replacement  ROS:   ROS Please see the history of present illness.     All other systems reviewed and are negative.  EKGs/Labs/Other Studies Reviewed:    The following studies were reviewed today:   EKG:  EKG is  ordered today.  The ekg ordered today demonstrates [facility] and normal EKG 07/09/17 [place] poor r wave progression non specific T waves Recent Labs: No results found  for requested labs within last 8760 hours.  Recent Lipid Panel    Component Value Date/Time   CHOL 127 08/18/2015 1231   CHOL 176 08/30/2012 1155   TRIG 82 08/18/2015 1231   TRIG 231 (H) 08/11/2014 1633   TRIG 68 08/30/2012 1155   HDL 39 (L) 08/18/2015 1231   HDL 27 (L) 08/11/2014 1633   HDL 39 (L) 08/30/2012 1155   CHOLHDL 3.3 08/18/2015 1231   LDLCALC 72 08/18/2015 1231   LDLCALC 123 (H) 08/30/2012 1155    Physical Exam:    VS:  BP 134/84 (BP Location: Left Arm, Patient Position: Sitting, Cuff Size: Normal)   Pulse 75   Ht 5\' 5"  (1.651 m)   Wt 185 lb 12.8 oz (  84.3 kg)   SpO2 99%   BMI 30.92 kg/m     Wt Readings from Last 3 Encounters:  08/01/17 185 lb 12.8 oz (84.3 kg)  04/20/17 188 lb (85.3 kg)  11/09/16 189 lb (85.7 kg)     GEN:  Well nourished, well developed in no acute distress HEENT: Normal NECK: No JVD; No carotid bruits LYMPHATICS: No lymphadenopathy CARDIAC: CCJ tenderness bilaterally RRR, no murmurs, rubs, gallops RESPIRATORY:  Clear to auscultation without rales, wheezing or rhonchi  ABDOMEN: Soft, non-tender, non-distended MUSCULOSKELETAL:  No edema; No deformity  SKIN: Warm and dry NEUROLOGIC:  Alert and oriented x 3 PSYCHIATRIC:  Normal affect     Signed, Shirlee More, MD  08/01/2017 10:05 AM    [place]

## 2017-08-01 ENCOUNTER — Ambulatory Visit (INDEPENDENT_AMBULATORY_CARE_PROVIDER_SITE_OTHER): Payer: Commercial Managed Care - PPO | Admitting: Cardiology

## 2017-08-01 ENCOUNTER — Encounter: Payer: Self-pay | Admitting: Cardiology

## 2017-08-01 VITALS — BP 134/84 | HR 75 | Ht 65.0 in | Wt 185.8 lb

## 2017-08-01 DIAGNOSIS — E785 Hyperlipidemia, unspecified: Secondary | ICD-10-CM

## 2017-08-01 DIAGNOSIS — E786 Lipoprotein deficiency: Secondary | ICD-10-CM | POA: Diagnosis not present

## 2017-08-01 DIAGNOSIS — R079 Chest pain, unspecified: Secondary | ICD-10-CM | POA: Diagnosis not present

## 2017-08-01 MED ORDER — METOPROLOL TARTRATE 50 MG PO TABS: 50.0000 mg | ORAL_TABLET | Freq: Once | ORAL | 0 refills | Status: DC

## 2017-08-01 MED ORDER — CELECOXIB 200 MG PO CAPS: 200.0000 mg | ORAL_CAPSULE | Freq: Two times a day (BID) | ORAL | 1 refills | Status: DC

## 2017-08-01 MED ORDER — ROSUVASTATIN CALCIUM 10 MG PO TABS: 10.0000 mg | ORAL_TABLET | Freq: Every day | ORAL | 3 refills | Status: DC

## 2017-08-01 MED ORDER — SIMVASTATIN 40 MG PO TABS: ORAL_TABLET | ORAL | 1 refills | Status: DC

## 2017-08-01 NOTE — Patient Instructions (Addendum)
Medication Instructions:  Your physician has recommended you make the following change in your medication:  STOP simvastatin START rosuvastatin (Crestor) 10 mg daily START celecoxib (Celebrex) 200 mg twice daily  Labwork: None  Testing/Procedures: Your physician has requested that you have cardiac CT. Cardiac computed tomography (CT) is a painless test that uses an x-ray machine to take clear, detailed pictures of your heart. For further information please visit HugeFiesta.tn. Please follow instruction sheet as given.  Please arrive at the [facility] main entrance of [facility] [place] at xx:xx AM (30-45 minutes prior to test start time)  [facility] [address] [place], [place] [address] [phone]  Proceed to the [facility] Radiology Department (First Floor).  Please follow these instructions carefully (unless otherwise directed):  On the Night Before the Test: . Drink plenty of water. . Do not consume any caffeinated/decaffeinated beverages or chocolate 12 hours prior to your test. . Do not take any antihistamines 12 hours prior to your test.  On the Day of the Test: . Drink plenty of water. Do not drink any water within one hour of the test. . Do not eat any food 4 hours prior to the test. . You may take your regular medications prior to the test. . IF NOT ON A BETA BLOCKER - Take 50 mg of lopressor (metoprolol) one hour before the test.  After the Test: . Drink plenty of water. . After receiving IV contrast, you may experience a mild flushed feeling. This is normal. . On occasion, you may experience a mild rash up to 24 hours after the test. This is not dangerous. If this occurs, you can take Benadryl 25 mg and increase your fluid intake. . If you experience trouble breathing, this can be serious. If it is severe call 911 IMMEDIATELY. If it is mild, please call our office.  Follow-Up: Your physician recommends that you schedule a follow-up  appointment in: 4 weeks.  Any Other Special Instructions Will Be Listed Below (If Applicable).     If you need a refill on your cardiac medications before your next appointment, please call your pharmacy.    Costochondritis Costochondritis is swelling and irritation (inflammation) of the tissue (cartilage) that connects your ribs to your breastbone (sternum). This causes pain in the front of your chest. Usually, the pain:  Starts gradually.  Is in more than one rib.  This condition usually goes away on its own over time. Follow these instructions at home:  Do not do anything that makes your pain worse.  If directed, put ice on the painful area: ? Put ice in a plastic bag. ? Place a towel between your skin and the bag. ? Leave the ice on for 20 minutes, 2-3 times a day.  If directed, put heat on the affected area as often as told by your doctor. Use the heat source that your doctor tells you to use, such as a moist heat pack or a heating pad. ? Place a towel between your skin and the heat source. ? Leave the heat on for 20-30 minutes. ? Take off the heat if your skin turns bright red. This is very important if you cannot feel pain, heat, or cold. You may have a greater risk of getting burned.  Take over-the-counter and prescription medicines only as told by your doctor.  Return to your normal activities as told by your doctor. Ask your doctor what activities are safe for you.  Keep all follow-up visits  as told by your doctor. This is important. Contact a doctor if:  You have chills or a fever.  Your pain does not go away or it gets worse.  You have a cough that does not go away. Get help right away if:  You are short of breath. This information is not intended to replace advice given to you by your health care provider. Make sure you discuss any questions you have with your health care provider. Document Released: 11/22/2007 Document Revised: 12/24/2015 Document  Reviewed: 09/29/2015 Elsevier Interactive Patient Education  [employer].

## 2017-08-01 NOTE — Telephone Encounter (Signed)
Last refill without being seen 

## 2017-08-29 ENCOUNTER — Ambulatory Visit: Payer: Commercial Managed Care - PPO | Admitting: Cardiology

## 2017-09-04 ENCOUNTER — Ambulatory Visit (HOSPITAL_COMMUNITY): Payer: Commercial Managed Care - PPO

## 2017-09-04 ENCOUNTER — Ambulatory Visit (HOSPITAL_COMMUNITY)
Admission: RE | Admit: 2017-09-04 | Discharge: 2017-09-04 | Disposition: A | Discharge status: Home / Self Care | Payer: Commercial Managed Care - PPO | Source: Ambulatory Visit | Attending: Cardiology | Admitting: Cardiology

## 2017-09-04 DIAGNOSIS — R079 Chest pain, unspecified: Secondary | ICD-10-CM | POA: Diagnosis not present

## 2017-09-04 MED ORDER — METOPROLOL TARTRATE 5 MG/5ML IV SOLN
5.0000 mg | INTRAVENOUS | Status: DC | PRN
  Filled 2017-09-04: qty 5

## 2017-09-04 MED ORDER — NITROGLYCERIN 0.4 MG SL SUBL
SUBLINGUAL_TABLET | SUBLINGUAL | Status: AC
  Administered 2017-09-04: 0.8 mg via SUBLINGUAL
  Filled 2017-09-04: qty 2

## 2017-09-04 MED ORDER — METOPROLOL TARTRATE 5 MG/5ML IV SOLN
INTRAVENOUS | Status: AC
  Filled 2017-09-04: qty 5

## 2017-09-04 MED ORDER — IOPAMIDOL (ISOVUE-370) INJECTION 76%
INTRAVENOUS | Status: AC
  Administered 2017-09-04: 80 mL
  Filled 2017-09-04: qty 100

## 2017-09-04 MED ORDER — NITROGLYCERIN 0.4 MG SL SUBL
0.8000 mg | SUBLINGUAL_TABLET | Freq: Once | SUBLINGUAL | Status: AC
  Administered 2017-09-04: 0.8 mg via SUBLINGUAL
  Filled 2017-09-04: qty 25

## 2017-09-06 NOTE — Progress Notes (Signed)
Cardiology Office Note:    Date:  09/07/2017   ID:  Laurie Kelley, DOB [birth year], MRN [id number]  PCP:  Chevis Pretty, FNP  Cardiologist:  Shirlee More, MD    Referring MD: Hassell Done, Mary-Margaret, *    ASSESSMENT:    1. Chest pain in adult    PLAN:    In order of problems listed above:  1. She is improved from costochondritis but still has some mild localized chest wall tenderness she prefers to continue to take a nonsteroidal anti-inflammatory drug and not to participate in physical therapy at this time.  I asked her to contact me if the symptoms are persistent or worsened I will make a referral and I will see back in the office as needed.   Next appointment: PRN   Medication Adjustments/Labs and Tests Ordered: Current medicines are reviewed at length with the patient today.  Concerns regarding medicines are outlined above.  No orders of the defined types were placed in this encounter.  No orders of the defined types were placed in this encounter.   Chief Complaint  Patient presents with  . Chest Pain    History of Present Illness:    Laurie Kelley is a 47 y.o. female with a hx of chest pain  last seen 08/01/17.  ASSESSMENT:    08/01/17   1. Chest pain in adult   2. Hyperlipidemia with target LDL less than 100   3. Low HDL (under 40)    PLAN:    1. Her symptoms and presentation are most consistent with chronic costochondral chest pain.  Unfortunately she has an increased cardiovascular risk with age and severe dyslipidemia elevated LDL and low HDL and for further evaluation will undergo coronary CTA.  If she has flow-limiting stenosis angiography and revascularization be appropriate.  In the interim she will continue lipid-lowering therapy and initiate a nonsteroidal anti-inflammatory drug and if unimproved consider physical therapy modalities. 2. She is having trouble tolerating simvastatin with muscle pain and weakness and was switched to  rosuvastatin. 3. An increased cardiovascular risk with combined low HDL elevated LDL prompting further evaluation cardiac CTA.  Unfortunately at present there is no pharmacologic agent for treating low HDL  Compliance with diet, lifestyle and medications: yes Past Medical History:  Diagnosis Date  . Chest pain in adult 07/31/2017  . Esophageal reflux 08/18/2014  . Gallstones   . Hyperlipidemia with target LDL less than 100 08/18/2015  . Internal hemorrhoids 08/18/2014    Past Surgical History:  Procedure Laterality Date  . CHOLECYSTECTOMY      Current Medications: Current Meds  Medication Sig  . celecoxib (CELEBREX) 200 MG capsule Take 1 capsule (200 mg total) by mouth 2 (two) times daily.  . medroxyPROGESTERone (DEPO-PROVERA) 150 MG/ML injection INJECT 150MG  INTRAMUSCULARLY EVERY 3 MONTHS AS INSTRUCTED  . Pumpkin Seed-Soy Germ (AZO BLADDER CONTROL/GO-LESS) CAPS Take 1 capsule by mouth daily.  . rosuvastatin (CRESTOR) 10 MG tablet Take 1 tablet (10 mg total) by mouth daily.  Marland Kitchen VITAMIN D, ERGOCALCIFEROL, PO Take 1 capsule by mouth daily.   . [DISCONTINUED] simvastatin (ZOCOR) 40 MG tablet TAKE 1 TABLET (40 MG TOTAL) BY MOUTH AT BEDTIME. MUST HAVE APPT FOR REFILLS   Current Facility-Administered Medications for the 09/07/17 encounter (Office Visit) with Richardo Priest, MD  Medication  . medroxyPROGESTERone (DEPO-PROVERA) injection 150 mg     Allergies:   Patient has no known allergies.   Social History   Socioeconomic History  . Marital status: Single  Spouse name: Not on file  . Number of children: Not on file  . Years of education: Not on file  . Highest education level: Not on file  Occupational History  . Not on file  Social Needs  . Financial resource strain: Not on file  . Food insecurity:    Worry: Not on file    Inability: Not on file  . Transportation needs:    Medical: Not on file    Non-medical: Not on file  Tobacco Use  . Smoking status: Never Smoker  .  Smokeless tobacco: Never Used  Substance and Sexual Activity  . Alcohol use: No  . Drug use: No  . Sexual activity: Not on file  Lifestyle  . Physical activity:    Days per week: Not on file    Minutes per session: Not on file  . Stress: Not on file  Relationships  . Social connections:    Talks on phone: Not on file    Gets together: Not on file    Attends religious service: Not on file    Active member of club or organization: Not on file    Attends meetings of clubs or organizations: Not on file    Relationship status: Not on file  Other Topics Concern  . Not on file  Social History Narrative  . Not on file     Family History: The patient's family history includes Bone cancer in her maternal aunt; Brain cancer in her maternal aunt; Heart disease in her father; Hyperlipidemia in her mother; Lung cancer in her maternal aunt. ROS:   Please see the history of present illness.    All other systems reviewed and are negative.  EKGs/Labs/Other Studies Reviewed:    The following studies were reviewed today Cardiac CTA 09/04/17 IMPRESSION: 1. Coronary artery calcium score 0 Agatston units, suggesting low risk for future cardiac events. 2.  No plaque or stenosis noted in the coronary arteries Recent Labs: No results found for requested labs within last 8760 hours.  Recent Lipid Panel    Component Value Date/Time   CHOL 127 08/18/2015 1231   CHOL 176 08/30/2012 1155   TRIG 82 08/18/2015 1231   TRIG 231 (H) 08/11/2014 1633   TRIG 68 08/30/2012 1155   HDL 39 (L) 08/18/2015 1231   HDL 27 (L) 08/11/2014 1633   HDL 39 (L) 08/30/2012 1155   CHOLHDL 3.3 08/18/2015 1231   LDLCALC 72 08/18/2015 1231   LDLCALC 123 (H) 08/30/2012 1155    Physical Exam:    VS:  BP 132/80 (BP Location: Right Arm, Patient Position: Sitting, Cuff Size: Normal)   Pulse 100   Ht 5\' 5"  (1.651 m)   Wt 186 lb (84.4 kg)   SpO2 98%   BMI 30.95 kg/m     Wt Readings from Last 3 Encounters:  09/07/17  186 lb (84.4 kg)  08/01/17 185 lb 12.8 oz (84.3 kg)  04/20/17 188 lb (85.3 kg)     GEN:  Well nourished, well developed in no acute distress HEENT: Normal NECK: No JVD; No carotid bruits LYMPHATICS: No lymphadenopathy CARDIAC: point tender R CCJ tnederness RRR, no murmurs, rubs, gallops RESPIRATORY:  Clear to auscultation without rales, wheezing or rhonchi  ABDOMEN: Soft, non-tender, non-distended MUSCULOSKELETAL:  No edema; No deformity  SKIN: Warm and dry NEUROLOGIC:  Alert and oriented x 3 PSYCHIATRIC:  Normal affect    Signed, Shirlee More, MD  09/07/2017 8:22 AM    [facility]

## 2017-09-07 ENCOUNTER — Ambulatory Visit (INDEPENDENT_AMBULATORY_CARE_PROVIDER_SITE_OTHER): Payer: Commercial Managed Care - PPO | Admitting: Cardiology

## 2017-09-07 ENCOUNTER — Encounter: Payer: Self-pay | Admitting: Cardiology

## 2017-09-07 VITALS — BP 132/80 | HR 100 | Ht 65.0 in | Wt 186.0 lb

## 2017-09-07 DIAGNOSIS — R079 Chest pain, unspecified: Secondary | ICD-10-CM

## 2017-09-07 NOTE — Patient Instructions (Addendum)
Medication Instructions:  Your physician recommends that you continue on your current medications as directed. Please refer to the Current Medication list given to you today.  Labwork: None  Testing/Procedures: None  Follow-Up: Your physician recommends that you schedule a follow-up appointment as needed if symptoms worsen or fail to improve.  Any Other Special Instructions Will Be Listed Below (If Applicable).     If you need a refill on your cardiac medications before your next appointment, please call your pharmacy.    Costocondritis Costochondritis La costocondritis es la hinchazn e irritacin (inflamacin) del tejido Statistician) que une las costillas con el esternn. Esto causa dolor en la parte frontal del pecho. Por lo general, el dolor tiene las siguientes caractersticas:  Comienza de forma gradual.  Se manifiesta en ms de [employer].  Generalmente, esta afeccin desaparece con el paso tiempo, sin tratamiento. Siga estas indicaciones en su casa:  No haga nada que intensifique el dolor.  Si se lo indican, aplique hielo sobre la zona del dolor: ? Field seismologist hielo en una bolsa plstica. ? Coloque una [employer] piel y la bolsa de hielo. ? Coloque el hielo durante 42minutos, 2 o 3veces por da.  Si se lo indican, aplique calor en la zona afectada con la frecuencia que le indique el mdico. Use la fuente de calor que el mdico le indique, por ejemplo, una compresa de calor hmedo o una almohadilla trmica. ? Colquese una [employer] piel y la fuente de Freight forwarder. ? Aplique el calor durante 20 a 26minutos. ? Retire la fuente de calor si la piel se le pone de color rojo brillante. Esto es muy importante si no puede sentir el dolor, el calor o el fro. Puede correr un riesgo mayor de sufrir quemaduras.  Tome los medicamentos de venta libre y los recetados solamente como se lo haya indicado el mdico.  Retome sus actividades habituales como se lo haya indicado  el mdico. Pregntele al mdico qu actividades son seguras para usted.  Concurra a todas las visitas de control como se lo haya indicado el mdico. Esto es importante. Comunquese con un mdico si:  Tiene escalofros o fiebre.  El dolor persiste o [place].  Tiene tos que no desaparece. Solicite ayuda de inmediato si:  Le falta el aire. Esta informacin no tiene Marine scientist el consejo del mdico. Asegrese de hacerle al mdico cualquier pregunta que tenga. Document Released: 07/08/2010 Document Revised: 09/07/2016 Document Reviewed: 09/29/2015 Elsevier Interactive Patient Education  [employer].

## 2017-10-10 ENCOUNTER — Ambulatory Visit (INDEPENDENT_AMBULATORY_CARE_PROVIDER_SITE_OTHER): Payer: Commercial Managed Care - PPO

## 2017-10-10 DIAGNOSIS — Z3042 Encounter for surveillance of injectable contraceptive: Secondary | ICD-10-CM

## 2018-01-02 ENCOUNTER — Ambulatory Visit (INDEPENDENT_AMBULATORY_CARE_PROVIDER_SITE_OTHER): Payer: Commercial Managed Care - PPO | Admitting: *Deleted

## 2018-01-02 DIAGNOSIS — Z3042 Encounter for surveillance of injectable contraceptive: Secondary | ICD-10-CM | POA: Diagnosis not present

## 2018-01-02 MED ORDER — MEDROXYPROGESTERONE ACETATE 150 MG/ML IM SUSP
150.0000 mg | Freq: Once | INTRAMUSCULAR | Status: AC
  Administered 2018-01-02: 150 mg via INTRAMUSCULAR

## 2018-01-02 NOTE — Progress Notes (Signed)
Depo provera given, pt tolerated well

## 2018-03-19 ENCOUNTER — Other Ambulatory Visit: Payer: Self-pay | Admitting: Nurse Practitioner

## 2018-03-28 ENCOUNTER — Ambulatory Visit (INDEPENDENT_AMBULATORY_CARE_PROVIDER_SITE_OTHER): Payer: Commercial Managed Care - PPO | Admitting: *Deleted

## 2018-03-28 DIAGNOSIS — Z3042 Encounter for surveillance of injectable contraceptive: Secondary | ICD-10-CM | POA: Diagnosis not present

## 2018-03-28 DIAGNOSIS — Z23 Encounter for immunization: Secondary | ICD-10-CM

## 2018-03-28 MED ORDER — MEDROXYPROGESTERONE ACETATE 150 MG/ML IM SUSP
150.0000 mg | INTRAMUSCULAR | Status: AC
  Administered 2018-03-28 – 2018-12-04 (×4): 150 mg via INTRAMUSCULAR

## 2018-03-28 NOTE — Progress Notes (Signed)
Pt given Medroyprogesterone inj and flu vaccine Tolerated well

## 2018-04-05 ENCOUNTER — Ambulatory Visit (INDEPENDENT_AMBULATORY_CARE_PROVIDER_SITE_OTHER): Payer: Commercial Managed Care - PPO | Admitting: Family Medicine

## 2018-04-05 ENCOUNTER — Encounter: Payer: Self-pay | Admitting: Family Medicine

## 2018-04-05 VITALS — BP 123/76 | HR 67 | Temp 97.2°F | Ht 65.0 in | Wt 185.0 lb

## 2018-04-05 DIAGNOSIS — R300 Dysuria: Secondary | ICD-10-CM | POA: Diagnosis not present

## 2018-04-05 LAB — MICROSCOPIC EXAMINATION: Renal Epithel, UA: NONE SEEN /hpf

## 2018-04-05 LAB — URINALYSIS, ROUTINE W REFLEX MICROSCOPIC
BILIRUBIN UA: NEGATIVE
Glucose, UA: NEGATIVE
KETONES UA: NEGATIVE
Nitrite, UA: NEGATIVE
Protein, UA: NEGATIVE
SPEC GRAV UA: 1.02 (ref 1.005–1.030)
UUROB: 0.2 mg/dL (ref 0.2–1.0)
pH, UA: 7 (ref 5.0–7.5)

## 2018-04-05 MED ORDER — CIPROFLOXACIN HCL 500 MG PO TABS: 500.0000 mg | ORAL_TABLET | Freq: Two times a day (BID) | ORAL | 0 refills | Status: DC
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
## 2018-04-11 ENCOUNTER — Other Ambulatory Visit: Payer: Commercial Managed Care - PPO

## 2018-04-11 DIAGNOSIS — R300 Dysuria: Secondary | ICD-10-CM

## 2018-04-11 LAB — MICROSCOPIC EXAMINATION: Renal Epithel, UA: NONE SEEN /hpf

## 2018-04-11 LAB — URINALYSIS, COMPLETE
Bilirubin, UA: NEGATIVE
GLUCOSE, UA: NEGATIVE
Ketones, UA: NEGATIVE
Leukocytes, UA: NEGATIVE
Nitrite, UA: NEGATIVE
Protein, UA: NEGATIVE
Specific Gravity, UA: >1.03 — ABNORMAL HIGH (ref 1.005–1.030)
Urobilinogen, Ur: 0.2 mg/dL (ref 0.2–1.0)
pH, UA: 5 (ref 5.0–7.5)

## 2018-04-15 ENCOUNTER — Other Ambulatory Visit: Payer: Self-pay | Admitting: Family Medicine
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
## 2018-06-14 ENCOUNTER — Telehealth: Payer: Self-pay | Admitting: Nurse Practitioner

## 2018-06-14 NOTE — Telephone Encounter (Signed)
Did you want her to have this done? Please review and advise

## 2018-06-14 NOTE — Telephone Encounter (Signed)
Pt aware she needs to check with her insurance to see if they will cover the DEXA scan due to being on Depo Provera for so long. Pt voiced understanding.

## 2018-06-14 NOTE — Telephone Encounter (Signed)
She will need to check with her ins to see ifthey will pay for it because she has beenon depo for so long.

## 2018-06-17 ENCOUNTER — Other Ambulatory Visit: Payer: Self-pay | Admitting: Nurse Practitioner

## 2018-06-17 ENCOUNTER — Other Ambulatory Visit (INDEPENDENT_AMBULATORY_CARE_PROVIDER_SITE_OTHER): Payer: Commercial Managed Care - PPO

## 2018-06-17 ENCOUNTER — Ambulatory Visit (INDEPENDENT_AMBULATORY_CARE_PROVIDER_SITE_OTHER): Payer: Commercial Managed Care - PPO

## 2018-06-17 DIAGNOSIS — Z3042 Encounter for surveillance of injectable contraceptive: Secondary | ICD-10-CM

## 2018-06-17 DIAGNOSIS — Z23 Encounter for immunization: Secondary | ICD-10-CM | POA: Diagnosis not present

## 2018-06-17 DIAGNOSIS — Z0000 Encounter for general adult medical examination without abnormal findings: Secondary | ICD-10-CM

## 2018-07-04 ENCOUNTER — Encounter: Payer: Self-pay | Admitting: Nurse Practitioner

## 2018-07-04 ENCOUNTER — Ambulatory Visit (INDEPENDENT_AMBULATORY_CARE_PROVIDER_SITE_OTHER): Payer: Commercial Managed Care - PPO | Admitting: Nurse Practitioner

## 2018-07-04 VITALS — BP 122/78 | HR 90 | Temp 97.1°F | Ht 65.0 in | Wt 186.0 lb

## 2018-07-04 DIAGNOSIS — K219 Gastro-esophageal reflux disease without esophagitis: Secondary | ICD-10-CM

## 2018-07-04 DIAGNOSIS — E785 Hyperlipidemia, unspecified: Secondary | ICD-10-CM

## 2018-07-04 DIAGNOSIS — Z6830 Body mass index (BMI) 30.0-30.9, adult: Secondary | ICD-10-CM | POA: Diagnosis not present

## 2018-07-04 MED ORDER — MEDROXYPROGESTERONE ACETATE 150 MG/ML IM SUSY: 1.0000 mL | PREFILLED_SYRINGE | INTRAMUSCULAR | 2 refills | Status: DC

## 2018-07-04 MED ORDER — ROSUVASTATIN CALCIUM 10 MG PO TABS: 10.0000 mg | ORAL_TABLET | Freq: Every day | ORAL | 3 refills | Status: DC

## 2018-07-04 NOTE — Progress Notes (Signed)
Subjective:    Patient ID: Laurie Kelley, female    DOB: [birth year], 48 y.o.   MRN: [id number]   Chief Complaint: medical managment of chronic issues  HPI:  1. Gastroesophageal reflux disease, esophagitis presence not specified  Is currently on no meds for this. Does not occur every day.   2. Hyperlipidemia with target LDL less than 100  Does not really watch diet and does no exercise. Cardiologist changed her from zocor to crestor which is causing some aches.  3. BMI 30.0-30.9,adult  No recent weight changes    Outpatient Encounter Medications as of 07/04/2018  Medication Sig  . celecoxib (CELEBREX) 200 MG capsule Take 1 capsule (200 mg total) by mouth 2 (two) times daily.  . ciprofloxacin (CIPRO) 500 MG tablet TAKE 1 TABLET BY MOUTH TWICE A DAY  . medroxyPROGESTERone Acetate 150 MG/ML SUSY INJECT 150MG INTRAMUSCULARLY EVERY 3 MONTHS AS INSTRUCTED  . metoprolol tartrate (LOPRESSOR) 50 MG tablet Take 1 tablet (50 mg total) by mouth once for 1 dose. Take 1 tablet, 1 hour prior to cardiac CTA.  Marland Kitchen Pumpkin Seed-Soy Germ (AZO BLADDER CONTROL/GO-LESS) CAPS Take 1 capsule by mouth daily.  . rosuvastatin (CRESTOR) 10 MG tablet Take 1 tablet (10 mg total) by mouth daily.  Marland Kitchen VITAMIN D, ERGOCALCIFEROL, PO Take 1 capsule by mouth daily.        New complaints: None today  Social history: Works at police department in jail  Review of Systems  Constitutional: Negative for activity change and appetite change.  HENT: Negative.   Eyes: Negative for pain.  Respiratory: Negative for shortness of breath.   Cardiovascular: Negative for chest pain, palpitations and leg swelling.  Gastrointestinal: Negative for abdominal pain.  Endocrine: Negative for polydipsia.  Genitourinary: Negative.   Skin: Negative for rash.  Neurological: Negative for dizziness, weakness and headaches.  Hematological: Does not bruise/bleed easily.  Psychiatric/Behavioral: Negative.   All other systems reviewed  and are negative.      Objective:   Physical Exam Vitals signs and nursing note reviewed.  Constitutional:      General: She is not in acute distress.    Appearance: Normal appearance. She is well-developed.  HENT:     Head: Normocephalic.     Nose: Nose normal.  Eyes:     Pupils: Pupils are equal, round, and reactive to light.  Neck:     Musculoskeletal: Normal range of motion and neck supple.     Vascular: No carotid bruit or JVD.  Cardiovascular:     Rate and Rhythm: Normal rate and regular rhythm.     Heart sounds: Normal heart sounds.  Pulmonary:     Effort: Pulmonary effort is normal. No respiratory distress.     Breath sounds: Normal breath sounds. No wheezing or rales.  Chest:     Chest wall: No tenderness.  Abdominal:     General: Bowel sounds are normal. There is no distension or abdominal bruit.     Palpations: Abdomen is soft. There is no hepatomegaly, splenomegaly, mass or pulsatile mass.     Tenderness: There is no abdominal tenderness.  Musculoskeletal: Normal range of motion.  Lymphadenopathy:     Cervical: No cervical adenopathy.  Skin:    General: Skin is warm and dry.  Neurological:     Mental Status: She is alert and oriented to person, place, and time.     Deep Tendon Reflexes: Reflexes are normal and symmetric.  Psychiatric:  Behavior: Behavior normal.        Thought Content: Thought content normal.        Judgment: Judgment normal.    BP 122/78   Pulse 90   Temp (!) 97.1 F (36.2 C) (Oral)   Ht 5' 5" (1.651 m)   Wt 186 lb (84.4 kg)   BMI 30.95 kg/m         Assessment & Plan:  Clotilda L Bouie comes in today with chief complaint of Medical Management of Chronic Issues (no)   Diagnosis and orders addressed:  1. Gastroesophageal reflux disease, esophagitis presence not specified Discussed diet and exercise for person with BMI >25 Will recheck weight in 3-6 months  2. Hyperlipidemia with target LDL less than 100 Low fat  diet Will see what cholesterol looks like before changing meds Kelley to muscle aches Take CoQ10 supplement with crestor - CMP14+EGFR - Lipid panel - rosuvastatin (CRESTOR) 10 MG tablet; Take 1 tablet (10 mg total) by mouth daily.  Dispense: 90 tablet; Refill: 3  3. BMI 30.0-30.9,adult Discussed diet and exercise for person with BMI >25 Will recheck weight in 3-6 months   Labs pending Health Maintenance reviewed Diet and exercise encouraged  Follow up plan: 6 months   Mary-Margaret Hassell Done, FNP

## 2018-07-04 NOTE — Patient Instructions (Signed)

## 2018-07-05 LAB — CMP14+EGFR
A/G RATIO: 2 (ref 1.2–2.2)
ALT: 13 IU/L (ref 0–32)
AST: 11 IU/L (ref 0–40)
Albumin: 4.3 g/dL (ref 3.5–5.5)
Alkaline Phosphatase: 148 IU/L — ABNORMAL HIGH (ref 39–117)
BILIRUBIN TOTAL: 0.4 mg/dL (ref 0.0–1.2)
BUN/Creatinine Ratio: 12 (ref 9–23)
BUN: 9 mg/dL (ref 6–24)
CO2: 21 mmol/L (ref 20–29)
CREATININE: 0.78 mg/dL (ref 0.57–1.00)
Calcium: 10.6 mg/dL — ABNORMAL HIGH (ref 8.7–10.2)
Chloride: 107 mmol/L — ABNORMAL HIGH (ref 96–106)
GFR calc Af Amer: 106 mL/min/{1.73_m2} (ref >59)
GFR, EST NON AFRICAN AMERICAN: 92 mL/min/{1.73_m2} (ref >59)
Globulin, Total: 2.1 g/dL (ref 1.5–4.5)
Glucose: 93 mg/dL (ref 65–99)
Potassium: 4.2 mmol/L (ref 3.5–5.2)
SODIUM: 142 mmol/L (ref 134–144)
TOTAL PROTEIN: 6.4 g/dL (ref 6.0–8.5)

## 2018-07-05 LAB — LIPID PANEL
CHOL/HDL RATIO: 4.5 ratio — AB (ref 0.0–4.4)
CHOLESTEROL TOTAL: 149 mg/dL (ref 100–199)
HDL: 33 mg/dL — ABNORMAL LOW (ref >39)
LDL Calculated: 89 mg/dL (ref 0–99)
TRIGLYCERIDES: 133 mg/dL (ref 0–149)
VLDL CHOLESTEROL CAL: 27 mg/dL (ref 5–40)

## 2018-07-09 ENCOUNTER — Telehealth: Payer: Self-pay | Admitting: Orthopaedic Surgery

## 2018-07-09 NOTE — Telephone Encounter (Signed)
Tegan called stating she had a Workman Comp injury on Sunday and went to [facility] ER.  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  I also told her that the [facility] would have to call our office to set up appointment.  I told her then that if an appointment was made, she would have to bring the ER physicians notes, xray disc and xray report.  She said she would do this.  She will have WC call us

## 2018-07-17 DIAGNOSIS — M25512 Pain in left shoulder: Secondary | ICD-10-CM | POA: Insufficient documentation

## 2018-08-20 ENCOUNTER — Other Ambulatory Visit: Payer: Self-pay

## 2018-08-20 ENCOUNTER — Ambulatory Visit: Payer: Commercial Managed Care - PPO | Attending: Orthopedic Surgery | Admitting: Physical Therapy

## 2018-08-20 ENCOUNTER — Encounter: Payer: Self-pay | Admitting: Physical Therapy

## 2018-08-20 DIAGNOSIS — M25512 Pain in left shoulder: Secondary | ICD-10-CM | POA: Diagnosis not present

## 2018-08-20 DIAGNOSIS — M6281 Muscle weakness (generalized): Secondary | ICD-10-CM | POA: Diagnosis present

## 2018-08-20 NOTE — Therapy (Signed)
[facility] [place], [place], [address] Phone: [phone]   Fax:  [phone]  Physical Therapy Evaluation  Patient Details  Name: Laurie Kelley MRN: [id number] Date of Birth: Aug 29, 1970 Referring Provider (PT): Esmond Plants, MD   Encounter Date: 08/20/2018  PT End of Session - 08/20/18 1557    Visit Number  1    Number of Visits  12    Date for PT Re-Evaluation  10/08/18    PT Start Time  1115    PT Stop Time  1202    PT Time Calculation (min)  47 min    Activity Tolerance  Patient tolerated treatment well    Behavior During Therapy  [facility] for tasks assessed/performed       Past Medical History:  Diagnosis Date  . Chest pain in adult 07/31/2017  . Esophageal reflux 08/18/2014  . Gallstones   . Hyperlipidemia with target LDL less than 100 08/18/2015  . Internal hemorrhoids 08/18/2014    Past Surgical History:  Procedure Laterality Date  . CHOLECYSTECTOMY      There were no vitals filed for this visit.   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 Patient reports she was out of work for 6 weeks and during that time she received a cortisone injection that reported helped reduce pain. Patient reported feeling more discomfort and pain after follow up visit. Patient returned to work on Friday, 08/16/2018 and reports she is able to perform work activities but with pain. Patient has not involved herself in restraining inmates and avoids activities that increase pain if she can. Patient reports ability to perform all ADLs but with pain. Patient reports sporadic numbness and tingling that runs along her left triceps. Patient reports pain is constantly at 4-5/10 and reports feeling heavy,  throbbing, and sore. Patient's goals are to decrease pain, improve strength, and improve ability to perform work activities.    Limitations  Reading;House hold activities;Other (comment)   work activities   Diagnostic tests  MRI and X-Ray: per patient report inflammation, increased fulid, and strain to RTC tendon    Patient Stated Goals  get arm feeling better again.    Currently in Pain?  Yes    Pain Score  5     Pain Location  Shoulder    Pain Orientation  Left    Pain Descriptors / Indicators  Constant    Pain Type  Acute pain    Pain Radiating Towards  triceps    Pain Onset  More than a month ago    Pain Frequency  Constant    Aggravating Factors   constant pain    Pain Relieving Factors  constant pain    Effect of Pain on Daily Activities  pain with all ADLs and work activities         [facility] PT Assessment - 08/20/18 0001      Assessment   Medical Diagnosis  Pain in left shoulder    Referring Provider (PT)  Esmond Plants, MD    Onset Date/Surgical Date  07/07/18    Hand Dominance  Right    Next MD Visit  September 05, 2018    Prior Therapy  no      Precautions  Precautions  Other (comment)    Precaution Comments  no lifting greater than 5-10 lbs      Restrictions   Weight Bearing Restrictions  No      Balance Screen   Has the patient fallen in the past 6 months  No    Has the patient had a decrease in activity level because of a fear of falling?   No    Is the patient reluctant to leave their home because of a fear of falling?   No      Home Environment   Living Environment  Private residence      Prior Function   Level of Independence  Independent      Posture/Postural Control   Posture/Postural Control  Postural limitations    Postural Limitations  Rounded Shoulders;Forward head      ROM / Strength   AROM / PROM / Strength  AROM;Strength      AROM   Overall AROM   Due to pain    Overall AROM Comments  left shoulder AROM WFL but with pain    AROM  Assessment Site  Shoulder    Right/Left Shoulder  Left    Left Shoulder Flexion  156 Degrees    Left Shoulder Internal Rotation  80 Degrees    Left Shoulder External Rotation  71 Degrees      Strength   Overall Strength  Within functional limits for tasks performed    Overall Strength Comments  Pain with end range PROM    Strength Assessment Site  Shoulder    Right/Left Shoulder  Left    Left Shoulder Flexion  4-/5    Left Shoulder ABduction  4-/5    Left Shoulder Internal Rotation  4+/5    Left Shoulder External Rotation  4+/5      Palpation   Palpation comment  very tender to left bicipital groove, left posterior shoulder, noted with muscle trigger point at left rhomboids      Special Tests    Special Tests  Rotator Cuff Impingement    Rotator Cuff Impingment tests  Michel Bickers test;Lift- off test      Hawkins-Kennedy test   Findings  Positive    Side  Left      Lift-Off test   Findings  Negative    Side  Left    Comment  (+) Pain; able to lift off      Ambulation/Gait   Gait Pattern  Within Functional Limits                Objective measurements completed on examination: See above findings.      [facility] Adult PT Treatment/Exercise - 08/20/18 0001      Modalities   Modalities  Electrical Stimulation;Vasopneumatic      Biomedical scientist Action  IFC    Electrical Stimulation Parameters  80-150 hz x15 mins    Electrical Stimulation Goals  Pain      Vasopneumatic   Number Minutes Vasopneumatic   15 minutes    Vasopnuematic Location   Shoulder    Vasopneumatic Pressure  Low    Vasopneumatic Temperature   40             PT Education - 08/20/18 1745    Education Details  scapular retractions, corner stretch    Person(s) Educated  Patient    Methods  Explanation;Handout    Comprehension  Verbalized  understanding          PT Long Term Goals - 08/20/18 1749      PT  LONG TERM GOAL #1   Title  Patient will be independent with HEP    Time  6    Period  Weeks    Status  New      PT LONG TERM GOAL #2   Title  Patient will report ability to perform ADLs and work activities with left shoulder pain less than 3/10.    Time  6    Period  Weeks    Status  New      PT LONG TERM GOAL #3   Title  Patient will demonstrate 4/5 or greater left shoulder MMT in all planes to improve stability during functional tasks.     Time  6    Period  Weeks    Status  New             Plan - 08/20/18 1601    Clinical Impression Statement  Patient is a 48 year old female who presents to physical therapy with decreased left shoulder strength, WFL ROM but (+) pain, and tenderness to palpation to left bicipital groove and left posterior shoulder. Patient noted with left trigger point along left rhomboids. Patient noted with rounded shoulders and forward head. Patient (+) for hawkin's kennedy tests indicating possible impingement syndrome. Patient (-) with inferior sulcus sign. Patient would benefit from skilled physical therapy to address deficits and address patient's goals.    Examination-Activity Limitations  Carry;Reach Overhead;Lift    Stability/Clinical Decision Making  Stable/Uncomplicated    Clinical Decision Making  Low    Rehab Potential  Good    PT Frequency  2x / week    PT Duration  6 weeks    PT Treatment/Interventions  ADLs/Self Care Home Management;Cryotherapy;Ultrasound;Moist Heat;Iontophoresis 4mg /ml Dexamethasone;Electrical Stimulation;Patient/family education;Therapeutic exercise;Therapeutic activities;Manual techniques;Dry needling;Passive range of motion;Vasopneumatic Device;Taping    PT Next Visit Plan  No UBE per Dr. Veverly Fells; postural exercises;     Consulted and Agree with Plan of Care  Patient       Patient will benefit from skilled therapeutic intervention in order to improve the following deficits and impairments:  Pain, Postural dysfunction,  Decreased activity tolerance, Decreased strength  Visit Diagnosis: Acute pain of left shoulder - Plan: PT plan of care cert/re-cert  Muscle weakness (generalized) - Plan: PT plan of care cert/re-cert     Problem List Patient Active Problem List   Diagnosis Date Noted  . BMI 30.0-30.9,adult 07/04/2018  . Low HDL (under 40) 08/01/2017  . Hyperlipidemia with target LDL less than 100 08/18/2015  . Internal hemorrhoids 08/18/2014  . Esophageal reflux 08/18/2014   Gabriela Eves, PT, DPT 08/20/2018, 9:09 PM  [facility] [place], [place], [address] Phone: [phone]   Fax:  [phone]  Name: Laurie Kelley MRN: [id number] Date of Birth: 1971/02/17

## 2018-08-23 ENCOUNTER — Ambulatory Visit: Payer: Commercial Managed Care - PPO | Admitting: *Deleted

## 2018-08-23 DIAGNOSIS — M25512 Pain in left shoulder: Secondary | ICD-10-CM

## 2018-08-23 DIAGNOSIS — M6281 Muscle weakness (generalized): Secondary | ICD-10-CM

## 2018-08-23 NOTE — Therapy (Signed)
[facility] [place], [place], [address] Phone: [phone]   Fax:  [phone]  Physical Therapy Treatment  Patient Details  Name: Laurie Kelley MRN: [id number] Date of Birth: [birth year] Referring Provider (PT): Esmond Plants, MD   Encounter Date: 08/23/2018  PT End of Session - 08/23/18 1226    Visit Number  2    Number of Visits  12    Date for PT Re-Evaluation  10/08/18    PT Start Time  1115    PT Stop Time  1540    PT Time Calculation (min)  50 min       Past Medical History:  Diagnosis Date  . Chest pain in adult 07/31/2017  . Esophageal reflux 08/18/2014  . Gallstones   . Hyperlipidemia with target LDL less than 100 08/18/2015  . Internal hemorrhoids 08/18/2014    Past Surgical History:  Procedure Laterality Date  . CHOLECYSTECTOMY      There were no vitals filed for this visit.  Subjective Assessment - 08/23/18 1123    Subjective  6/10 LT shldr pain today    Limitations  Reading;House hold activities;Other (comment)    Diagnostic tests  MRI and X-Ray: per patient report inflammation, increased fulid, and strain to RTC tendon    Patient Stated Goals  get arm feeling better again.    Currently in Pain?  Yes    Pain Score  6     Pain Location  Shoulder    Pain Orientation  Left    Pain Descriptors / Indicators  Constant                       [facility] Adult PT Treatment/Exercise - 08/23/18 0001      Exercises   Exercises  Shoulder      Shoulder Exercises: Standing   Other Standing Exercises  RW 4 with yellow tband x 20     Other Standing Exercises  yellow tband and handout given for HEP      Shoulder Exercises: Pulleys   Flexion  5 minutes    Other Pulley Exercises  UE ranger x 5 mins       Modalities   Modalities  Electrical Stimulation;Vasopneumatic      Acupuncturist Location  --    Printmaker Action  --    Printmaker  Parameters  --    Printmaker Goals  --      Vasopneumatic   Number Minutes Vasopneumatic   15 minutes    Vasopnuematic Location   Shoulder    Vasopneumatic Pressure  Low    Vasopneumatic Temperature   40                  PT Long Term Goals - 08/20/18 1749      PT LONG TERM GOAL #1   Title  Patient will be independent with HEP    Time  6    Period  Weeks    Status  New      PT LONG TERM GOAL #2   Title  Patient will report ability to perform ADLs and work activities with left shoulder pain less than 3/10.    Time  6    Period  Weeks    Status  New      PT LONG TERM GOAL #3   Title  Patient will demonstrate 4/5 or greater left shoulder MMT in all planes  to improve stability during functional tasks.     Time  6    Period  Weeks    Status  New            Plan - 08/23/18 1229    Clinical Impression Statement  Pt arrived today doing fair with LT shldr. She was able to complete AAROM exs with pulleys and UE ranger in sitting. HEP for RW 4 with yellow tband was given and tolerated well. VASO tolerated well. Estim not performed due to not available    Examination-Activity Limitations  Carry;Reach Overhead;Lift    Clinical Decision Making  Low    Rehab Potential  Good    PT Frequency  2x / week    PT Duration  6 weeks    PT Treatment/Interventions  ADLs/Self Care Home Management;Cryotherapy;Ultrasound;Moist Heat;Iontophoresis 4mg /ml Dexamethasone;Electrical Stimulation;Patient/family education;Therapeutic exercise;Therapeutic activities;Manual techniques;Dry needling;Passive range of motion;Vasopneumatic Device;Taping    PT Next Visit Plan  No UBE per Dr. Veverly Fells; postural exercises;     Consulted and Agree with Plan of Care  Patient       Patient will benefit from skilled therapeutic intervention in order to improve the following deficits and impairments:  Pain, Postural dysfunction, Decreased activity tolerance, Decreased strength  Visit  Diagnosis: Acute pain of left shoulder  Muscle weakness (generalized)     Problem List Patient Active Problem List   Diagnosis Date Noted  . BMI 30.0-30.9,adult 07/04/2018  . Low HDL (under 40) 08/01/2017  . Hyperlipidemia with target LDL less than 100 08/18/2015  . Internal hemorrhoids 08/18/2014  . Esophageal reflux 08/18/2014    ,CHRIS, PTA 08/23/2018, 12:40 PM  [facility] [place], [place], [address] Phone: [phone]   Fax:  [phone]  Name: Laurie Kelley MRN: [id number] Date of Birth: Sep 11, 1970

## 2018-08-28 ENCOUNTER — Encounter: Payer: Self-pay | Admitting: Physical Therapy

## 2018-08-28 ENCOUNTER — Ambulatory Visit: Payer: Commercial Managed Care - PPO | Admitting: Physical Therapy

## 2018-08-28 ENCOUNTER — Other Ambulatory Visit: Payer: Self-pay

## 2018-08-28 DIAGNOSIS — M6281 Muscle weakness (generalized): Secondary | ICD-10-CM

## 2018-08-28 DIAGNOSIS — M25512 Pain in left shoulder: Secondary | ICD-10-CM

## 2018-08-28 NOTE — Therapy (Signed)
[facility] [place], [place], [address] Phone: [phone]   Fax:  [phone]  Physical Therapy Treatment  Patient Details  Name: Laurie Kelley MRN: [id number] Date of Birth: 18-Oct-1970 Referring Provider (PT): Esmond Plants, MD   Encounter Date: 08/28/2018  PT End of Session - 08/28/18 0911    Visit Number  3    Number of Visits  12    Date for PT Re-Evaluation  10/08/18    Authorization Type  Progress note every 10th visit    PT Start Time  0900    PT Stop Time  0956    PT Time Calculation (min)  56 min    Activity Tolerance  Patient tolerated treatment well    Behavior During Therapy  [facility] for tasks assessed/performed       Past Medical History:  Diagnosis Date  . Chest pain in adult 07/31/2017  . Esophageal reflux 08/18/2014  . Gallstones   . Hyperlipidemia with target LDL less than 100 08/18/2015  . Internal hemorrhoids 08/18/2014    Past Surgical History:  Procedure Laterality Date  . CHOLECYSTECTOMY      There were no vitals filed for this visit.  Subjective Assessment - 08/28/18 0909    Subjective  Patient reports 3/10.    Limitations  Reading;House hold activities;Other (comment)    Diagnostic tests  MRI and X-Ray: per patient report inflammation, increased fluid, and strain to RTC tendon    Patient Stated Goals  get arm feeling better again.    Currently in Pain?  Yes    Pain Score  3     Pain Location  Shoulder    Pain Orientation  Left    Pain Descriptors / Indicators  Tender    Pain Type  Acute pain    Pain Onset  More than a month ago         [facility] PT Assessment - 08/28/18 0001      Assessment   Medical Diagnosis  Pain in left shoulder    Referring Provider (PT)  Esmond Plants, MD    Onset Date/Surgical Date  07/07/18    Hand Dominance  Right    Next MD Visit  September 05, 2018    Prior Therapy  no      Precautions   Precautions  Other (comment)    Precaution Comments  no lifting  greater than 5-10 lbs      Restrictions   Weight Bearing Restrictions  No                   [facility] Adult PT Treatment/Exercise - 08/28/18 0001      Exercises   Exercises  Shoulder      Shoulder Exercises: Supine   Other Supine Exercises  supine biceps curls x20 yellow theraband      Shoulder Exercises: Standing   Other Standing Exercises  RW 4 with yellow tband x 20       Shoulder Exercises: Pulleys   Flexion  5 minutes    Other Pulley Exercises  UE ranger standing x 6 mins (flexion, CC, CCW)      Modalities   Modalities  Electrical Stimulation;Vasopneumatic      Acupuncturist Location  15    Electrical Stimulation Action  IFC    Electrical Stimulation Parameters  1-10 hz x15 mins    Electrical Stimulation Goals  Pain      Vasopneumatic   Number Minutes  Vasopneumatic   15 minutes    Vasopnuematic Location   Shoulder    Vasopneumatic Pressure  Low    Vasopneumatic Temperature   40      Manual Therapy   Manual Therapy  Soft tissue mobilization    Soft tissue mobilization  STW/M to left biceps and TPR to left biceps to decrease tone and pain                  PT Long Term Goals - 08/20/18 1749      PT LONG TERM GOAL #1   Title  Patient will be independent with HEP    Time  6    Period  Weeks    Status  New      PT LONG TERM GOAL #2   Title  Patient will report ability to perform ADLs and work activities with left shoulder pain less than 3/10.    Time  6    Period  Weeks    Status  New      PT LONG TERM GOAL #3   Title  Patient will demonstrate 4/5 or greater left shoulder MMT in all planes to improve stability during functional tasks.     Time  6    Period  Weeks    Status  New            Plan - 08/28/18 1256    Clinical Impression Statement  Patient was able to tolerate progression of exercises well and and was able to perform all reps despite ongoing pain. Patient reports being compliant with  added exercises. Patient noted with left biceps trigger points and increased tenderness to left bicipital groove. No adverse affects noted upon removal of modalities.     Examination-Activity Limitations  Carry;Reach Overhead;Lift    Stability/Clinical Decision Making  Stable/Uncomplicated    Clinical Decision Making  Low    Rehab Potential  Good    PT Frequency  2x / week    PT Duration  6 weeks    PT Treatment/Interventions  ADLs/Self Care Home Management;Cryotherapy;Ultrasound;Moist Heat;Iontophoresis 4mg /ml Dexamethasone;Electrical Stimulation;Patient/family education;Therapeutic exercise;Therapeutic activities;Manual techniques;Dry needling;Passive range of motion;Vasopneumatic Device;Taping    PT Next Visit Plan  No UBE per Dr. Veverly Fells; postural exercises; rotator cuff strengthening; modalities PRN for pain relief    Consulted and Agree with Plan of Care  Patient       Patient will benefit from skilled therapeutic intervention in order to improve the following deficits and impairments:  Pain, Postural dysfunction, Decreased activity tolerance, Decreased strength  Visit Diagnosis: Acute pain of left shoulder  Muscle weakness (generalized)     Problem List Patient Active Problem List   Diagnosis Date Noted  . BMI 30.0-30.9,adult 07/04/2018  . Low HDL (under 40) 08/01/2017  . Hyperlipidemia with target LDL less than 100 08/18/2015  . Internal hemorrhoids 08/18/2014  . Esophageal reflux 08/18/2014   Gabriela Eves, PT, DPT 08/28/2018, 3:05 PM  [facility] [address] [place], [place], [address] Phone: [phone]   Fax:  [phone]  Name: ESTEEN DELPRIORE MRN: [id number] Date of Birth: Jun 22, 1970

## 2018-08-29 ENCOUNTER — Ambulatory Visit: Payer: Commercial Managed Care - PPO | Admitting: Physical Therapy

## 2018-08-29 ENCOUNTER — Other Ambulatory Visit: Payer: Self-pay

## 2018-08-29 DIAGNOSIS — M25512 Pain in left shoulder: Secondary | ICD-10-CM | POA: Diagnosis not present

## 2018-08-29 DIAGNOSIS — M6281 Muscle weakness (generalized): Secondary | ICD-10-CM

## 2018-08-29 NOTE — Therapy (Signed)
[facility] [place], [place], [address] Phone: [phone]   Fax:  [phone]  Physical Therapy Treatment  Patient Details  Name: Laurie Kelley MRN: [id number] Date of Birth: 29-Aug-1970 Referring Provider (PT): Esmond Plants, MD   Encounter Date: 08/29/2018  PT End of Session - 08/29/18 1518    Visit Number  4    Number of Visits  12    Date for PT Re-Evaluation  10/08/18    Authorization Type  Progress note every 10th visit    PT Start Time  1515    PT Stop Time  1612    PT Time Calculation (min)  57 min    Activity Tolerance  Patient tolerated treatment well    Behavior During Therapy  [facility] for tasks assessed/performed       Past Medical History:  Diagnosis Date  . Chest pain in adult 07/31/2017  . Esophageal reflux 08/18/2014  . Gallstones   . Hyperlipidemia with target LDL less than 100 08/18/2015  . Internal hemorrhoids 08/18/2014    Past Surgical History:  Procedure Laterality Date  . CHOLECYSTECTOMY      There were no vitals filed for this visit.  Subjective Assessment - 08/29/18 1623    Subjective  Patient arrives with 2/10 pain today. Patient inquired on use of salonpas patch on shoulder.    Limitations  Reading;House hold activities;Other (comment)    Diagnostic tests  MRI and X-Ray: per patient report inflammation, increased fluid, and strain to RTC tendon    Patient Stated Goals  get arm feeling better again.    Currently in Pain?  Yes    Pain Score  2     Pain Location  Shoulder    Pain Orientation  Left    Pain Descriptors / Indicators  Tender    Pain Type  Acute pain    Pain Onset  More than a month ago    Pain Frequency  Constant         [facility] PT Assessment - 08/29/18 0001      Assessment   Medical Diagnosis  Pain in left shoulder    Referring Provider (PT)  Esmond Plants, MD    Onset Date/Surgical Date  07/07/18    Hand Dominance  Right    Next MD Visit  September 05, 2018    Prior  Therapy  no      Precautions   Precautions  Other (comment)    Precaution Comments  no lifting greater than 5-10 lbs                   [facility] Adult PT Treatment/Exercise - 08/29/18 0001      Exercises   Exercises  Shoulder      Shoulder Exercises: Standing   Extension  Strengthening;Both;20 reps    Extension Limitations  Pink XTS    Row  Strengthening;Both;20 reps;Other (comment)    Row Limitations  Pink XTS    Other Standing Exercises  RW 4 with yellow tband x 20     Other Standing Exercises  standing bicep curls yellow x20 followed by scapular clock, abd and flexion x10      Shoulder Exercises: Pulleys   Flexion  5 minutes      Modalities   Modalities  Electrical Stimulation;Vasopneumatic      Electrical Stimulation   Electrical Stimulation Location  15    Electrical Stimulation Action  IFC    Electrical Stimulation Parameters  1-10  hz x15 mins    Electrical Stimulation Goals  Pain      Vasopneumatic   Number Minutes Vasopneumatic   15 minutes    Vasopnuematic Location   Shoulder    Vasopneumatic Pressure  Low      Manual Therapy   Manual Therapy  Soft tissue mobilization    Soft tissue mobilization  STW/M to left biceps and UT; TPR to left levator scap to decrease tone and pain                  PT Long Term Goals - 08/20/18 1749      PT LONG TERM GOAL #1   Title  Patient will be independent with HEP    Time  6    Period  Weeks    Status  New      PT LONG TERM GOAL #2   Title  Patient will report ability to perform ADLs and work activities with left shoulder pain less than 3/10.    Time  6    Period  Weeks    Status  New      PT LONG TERM GOAL #3   Title  Patient will demonstrate 4/5 or greater left shoulder MMT in all planes to improve stability during functional tasks.     Time  6    Period  Weeks    Status  New            Plan - 08/29/18 1619    Clinical Impression Statement  Patient tolerate treatment fairly well but  noted with ongoing pain. Patient noted with good form after verbal and tactile cuing. Patient noted with a trigger point in levator scapulae insertion point. TPR performed to which reduced in tone but did not dissipate. Patient instructed on levator scap stretch for HEP. Patient reported understanding and agreement. Normal response to modalities upon removal. Patient educated that she can use salonpas patch for temporary relief. Patient reported understanding.     Examination-Activity Limitations  Carry;Reach Overhead;Lift    Stability/Clinical Decision Making  Stable/Uncomplicated    Clinical Decision Making  Low    Rehab Potential  Good    PT Frequency  2x / week    PT Duration  6 weeks    PT Treatment/Interventions  ADLs/Self Care Home Management;Cryotherapy;Ultrasound;Moist Heat;Iontophoresis 4mg /ml Dexamethasone;Electrical Stimulation;Patient/family education;Therapeutic exercise;Therapeutic activities;Manual techniques;Dry needling;Passive range of motion;Vasopneumatic Device;Taping    PT Next Visit Plan  No UBE per Dr. Veverly Fells; postural exercises; rotator cuff strengthening; modalities PRN for pain relief    Consulted and Agree with Plan of Care  Patient       Patient will benefit from skilled therapeutic intervention in order to improve the following deficits and impairments:  Pain, Postural dysfunction, Decreased activity tolerance, Decreased strength  Visit Diagnosis: Acute pain of left shoulder  Muscle weakness (generalized)     Problem List Patient Active Problem List   Diagnosis Date Noted  . BMI 30.0-30.9,adult 07/04/2018  . Low HDL (under 40) 08/01/2017  . Hyperlipidemia with target LDL less than 100 08/18/2015  . Internal hemorrhoids 08/18/2014  . Esophageal reflux 08/18/2014    Gabriela Eves, PT, DPT 08/29/2018, 4:24 PM  [facility] [address] [place], [place], [address] Phone: [phone]   Fax:   [phone]  Name: Laurie Kelley MRN: [id number] Date of Birth: Oct 22, 1970

## 2018-09-03 ENCOUNTER — Other Ambulatory Visit: Payer: Self-pay

## 2018-09-03 ENCOUNTER — Ambulatory Visit: Payer: Commercial Managed Care - PPO | Admitting: *Deleted

## 2018-09-03 DIAGNOSIS — M25512 Pain in left shoulder: Secondary | ICD-10-CM | POA: Diagnosis not present

## 2018-09-03 DIAGNOSIS — M6281 Muscle weakness (generalized): Secondary | ICD-10-CM

## 2018-09-03 NOTE — Therapy (Addendum)
[facility] [address] [place], , [address] Phone: [phone]   Fax:  [phone]  Physical Therapy Treatment  PHYSICAL THERAPY DISCHARGE SUMMARY  Visits from Start of Care: 5  Current functional level related to goals / functional outcomes: See below   Remaining deficits: See goals   Education / Equipment: HEP Plan: Patient agrees to discharge.  Patient goals were not met. Patient is being discharged due to the patient's request.  ?????    Patient was released from doctor and returned to work.   Krystle Mangawang, PT, DPT 10/08/18    Patient Details  Name: Laurie Kelley MRN: [id number] Date of Birth: 09/27/1970 Referring Provider (PT): Steven Norris, MD   Encounter Date: 09/03/2018  PT End of Session - 09/03/18 1520    Visit Number  5    Number of Visits  12    Date for PT Re-Evaluation  10/08/18    Authorization Type  Progress note every 10th visit    PT Start Time  1515    PT Stop Time  1610    PT Time Calculation (min)  55 min       Past Medical History:  Diagnosis Date  . Chest pain in adult 07/31/2017  . Esophageal reflux 08/18/2014  . Gallstones   . Hyperlipidemia with target LDL less than 100 08/18/2015  . Internal hemorrhoids 08/18/2014    Past Surgical History:  Procedure Laterality Date  . CHOLECYSTECTOMY      There were no vitals filed for this visit.  Subjective Assessment - 09/03/18 1517    Subjective  Did ok after last Rx, still have to work    Limitations  Reading;House hold activities;Other (comment)    Diagnostic tests  MRI and X-Ray: per patient report inflammation, increased fluid, and strain to RTC tendon    Patient Stated Goals  get arm feeling better again.    Currently in Pain?  Yes    Pain Score  3     Pain Location  Shoulder    Pain Orientation  Left    Pain Onset  More than a month ago    Pain Frequency  Constant                       [facility] Adult PT  Treatment/Exercise - 09/03/18 0001      Exercises   Exercises  Shoulder      Shoulder Exercises: Standing   Extension  Strengthening;Both;20 reps;10 reps    Extension Limitations  Pink XTS    Row  Strengthening;Both;20 reps;Other (comment);10 reps    Row Limitations  Pink XTS    Other Standing Exercises  RW 4 with red  tband x 20       Shoulder Exercises: Pulleys   Flexion  5 minutes      Modalities   Modalities  Electrical Stimulation;Vasopneumatic      Electrical Stimulation   Electrical Stimulation Location  15    Electrical Stimulation Goals  Pain      Vasopneumatic   Number Minutes Vasopneumatic   15 minutes    Vasopnuematic Location   Shoulder    Vasopneumatic Pressure  Low    Vasopneumatic Temperature   40      Manual Therapy   Manual Therapy  Soft tissue mobilization    Soft tissue mobilization  STW/M to left biceps and UT; TPR to left levator scap to decrease tone and pain   Very tender                   PT Long Term Goals - 08/20/18 1749      PT LONG TERM GOAL #1   Title  Patient will be independent with HEP    Time  6    Period  Weeks    Status  New      PT LONG TERM GOAL #2   Title  Patient will report ability to perform ADLs and work activities with left shoulder pain less than 3/10.    Time  6    Period  Weeks    Status  New      PT LONG TERM GOAL #3   Title  Patient will demonstrate 4/5 or greater left shoulder MMT in all planes to improve stability during functional tasks.     Time  6    Period  Weeks    Status  New            Plan - 09/03/18 1556    Clinical Impression Statement  Pt arrived today with some pain/ soreness in LT shldr, but did well with therex with mainly soreness and fatigue. She had notable tightness and soreness in LT Utrap and levator. There was some release withSTW, but very sensitive to pressure today.    Examination-Activity Limitations  Carry;Reach Overhead;Lift    Stability/Clinical Decision Making   Stable/Uncomplicated    Rehab Potential  Good    PT Frequency  2x / week    PT Duration  6 weeks    PT Treatment/Interventions  ADLs/Self Care Home Management;Cryotherapy;Ultrasound;Moist Heat;Iontophoresis 27m/ml Dexamethasone;Electrical Stimulation;Patient/family education;Therapeutic exercise;Therapeutic activities;Manual techniques;Dry needling;Passive range of motion;Vasopneumatic Device;Taping    PT Next Visit Plan  No UBE per Dr. NVeverly Fells postural exercises; rotator cuff strengthening; modalities PRN for pain relief      Try UKoreatoLT UTrap    Consulted and Agree with Plan of Care  Patient       Patient will benefit from skilled therapeutic intervention in order to improve the following deficits and impairments:  Pain, Postural dysfunction, Decreased activity tolerance, Decreased strength  Visit Diagnosis: Acute pain of left shoulder  Muscle weakness (generalized)     Problem List Patient Active Problem List   Diagnosis Date Noted  . BMI 30.0-30.9,adult 07/04/2018  . Low HDL (under 40) 08/01/2017  . Hyperlipidemia with target LDL less than 100 08/18/2015  . Internal hemorrhoids 08/18/2014  . Esophageal reflux 08/18/2014    ,CHRIS, PTA 09/03/2018, 5:44 PM  [facility] [address]Phone: [phone]  Fax:  [phone] Name: Laurie BOEHNINGMRN: [id number] of Birth: 7Dec 07, 1972

## 2018-09-06 ENCOUNTER — Ambulatory Visit: Payer: Commercial Managed Care - PPO | Admitting: Physical Therapy

## 2018-09-13 ENCOUNTER — Other Ambulatory Visit: Payer: Self-pay

## 2018-09-16 ENCOUNTER — Ambulatory Visit (INDEPENDENT_AMBULATORY_CARE_PROVIDER_SITE_OTHER): Payer: Commercial Managed Care - PPO

## 2018-09-16 ENCOUNTER — Other Ambulatory Visit: Payer: Self-pay

## 2018-09-16 DIAGNOSIS — Z3042 Encounter for surveillance of injectable contraceptive: Secondary | ICD-10-CM

## 2018-09-20 ENCOUNTER — Telehealth: Payer: Self-pay | Admitting: Physical Therapy

## 2018-09-20 NOTE — Telephone Encounter (Signed)
Laurie Kelley was contacted today regarding the temporary reduction of OP Rehab Services due to concerns for community transmission of Covid-19.    Therapist advised the patient to continue to perform their HEP and assured they had no unanswered questions at this time. Outpatient Rehabilitation Services will follow up with this client. Patient is aware we can be reached by telephone during limited business hours in the meantime.   Patient to return to MD on 10/01/2018.

## 2018-09-26 ENCOUNTER — Telehealth: Payer: Self-pay | Admitting: Physical Therapy

## 2018-09-26 NOTE — Telephone Encounter (Addendum)
Laurie Kelley was contacted today regarding the temporary reduction of OP Rehab Services due to concerns for community transmission of Covid-19.    Patient will have a follow-up appointment with her orthopedic doctor on Tuesday 10/01/18. She would like to call back after her visit with the doctor.  She is compliant with her HEP.Patient is aware we can be reached by telephone during limited business hours in the meantime.   Gabriela Eves, PT, DPT

## 2018-12-04 ENCOUNTER — Other Ambulatory Visit: Payer: Self-pay

## 2018-12-04 ENCOUNTER — Ambulatory Visit (INDEPENDENT_AMBULATORY_CARE_PROVIDER_SITE_OTHER): Payer: Commercial Managed Care - PPO | Admitting: *Deleted

## 2018-12-04 DIAGNOSIS — Z3042 Encounter for surveillance of injectable contraceptive: Secondary | ICD-10-CM | POA: Diagnosis not present

## 2018-12-04 NOTE — Progress Notes (Signed)
Pt given Medroxyprogesterone inj Tolerated well

## 2018-12-16 ENCOUNTER — Encounter: Payer: Self-pay | Admitting: Family Medicine

## 2018-12-16 ENCOUNTER — Other Ambulatory Visit: Payer: Self-pay

## 2018-12-16 ENCOUNTER — Ambulatory Visit: Payer: Commercial Managed Care - PPO | Admitting: Family Medicine

## 2018-12-16 ENCOUNTER — Ambulatory Visit (INDEPENDENT_AMBULATORY_CARE_PROVIDER_SITE_OTHER): Payer: Commercial Managed Care - PPO | Admitting: Family Medicine

## 2018-12-16 VITALS — BP 132/76 | HR 91 | Temp 97.6°F | Ht 65.0 in | Wt 185.0 lb

## 2018-12-16 DIAGNOSIS — L237 Allergic contact dermatitis due to plants, except food: Secondary | ICD-10-CM

## 2018-12-16 MED ORDER — HYDROXYZINE HCL 25 MG PO TABS: 25.0000 mg | ORAL_TABLET | Freq: Three times a day (TID) | ORAL | 0 refills | Status: DC | PRN

## 2018-12-16 MED ORDER — METHYLPREDNISOLONE ACETATE 80 MG/ML IJ SUSP
80.0000 mg | Freq: Once | INTRAMUSCULAR | Status: AC
  Administered 2018-12-16: 12:00:00 80 mg via INTRAMUSCULAR

## 2018-12-16 MED ORDER — PREDNISONE 10 MG (21) PO TBPK: ORAL_TABLET | ORAL | 0 refills | Status: DC

## 2018-12-16 NOTE — Progress Notes (Signed)
    Subjective:     Laurie Kelley is a 48 y.o. female who presents for evaluation of a rash involving the face and forearm. Rash started several days ago after working in the yard. Lesions are clear, and blistering in texture. Rash has changed over time. Rash is painful and is pruritic. Associated symptoms: none. Patient denies: abdominal pain, cough, headache and sore throat. Patient has not had contacts with similar rash. Patient has had new exposures plants.  The following portions of the patient's history were reviewed and updated as appropriate: allergies, current medications, past family history, past medical history, past social history, past surgical history and problem list.  Review of Systems Constitutional: negative Eyes: negative Ears, nose, mouth, throat, and face: positive for rash to face. Respiratory: negative Cardiovascular: negative Integument/breast: positive for rash    Objective:    BP 132/76   Pulse 91   Temp 97.6 F (36.4 C) (Oral)   Ht 5\' 5"  (1.651 m)   Wt 185 lb (83.9 kg)   BMI 30.79 kg/m  General:  alert, cooperative and no distress  Skin:  lesion noted on extremities and face and vesicles noted on extremities and face. Clustered vesicular lesions to right forearm and right face. Clear colored fluid filled blister to right posterior forearm. No surrounding erythema or purulent drainage.     Physical Exam  Constitutional: She is oriented to person, place, and time and well-developed, well-nourished, and in no distress. No distress.  HENT:  Head: Normocephalic.  Cardiovascular: Normal rate and regular rhythm. Exam reveals friction rub. Exam reveals no gallop.  No murmur heard. Pulmonary/Chest: Effort normal and breath sounds normal. No respiratory distress.  Neurological: She is alert and oriented to person, place, and time.  Skin: She is not diaphoretic.  Psychiatric: Mood, memory, affect and judgment normal.    Assessment:   Laurie Kelley was seen today  for rash.  Diagnoses and all orders for this visit:  Poison oak dermatitis Symptomatic care discussed. Medications as prescribed. Report any new or worsening symptoms.  -     methylPREDNISolone acetate (DEPO-MEDROL) injection 80 mg -     predniSONE (STERAPRED UNI-PAK 21 TAB) 10 MG (21) TBPK tablet; As directed x 6 days -     hydrOXYzine (ATARAX/VISTARIL) 25 MG tablet; Take 1 tablet (25 mg total) by mouth 3 (three) times daily as needed.     Plan:    Medications: steroids: depomedrol and atarax. Written patient instruction given.  Return if symptoms worsen or fail to improve.  The above assessment and management plan was discussed with the patient. The patient verbalized understanding of and has agreed to the management plan. Patient is aware to call the clinic if symptoms fail to improve or worsen. Patient is aware when to return to the clinic for a follow-up visit. Patient educated on when it is appropriate to go to the emergency department.   Monia Pouch, FNP-C [facility] [address] [place], [place] [address] [phone]

## 2018-12-16 NOTE — Patient Instructions (Signed)
Poison Oak Dermatitis  Poison oak dermatitis is redness and soreness (inflammation) of the skin caused by chemicals in the leaves of the poison oak plant. You may have very bad itching, swelling, a rash, and blisters. What are the causes? You may get this condition by:  Touching a poison oak plant.  Touching something that has the chemical from the leaves on it. This may include animals or objects that have come in contact with the plant. What increases the risk? You are more likely to get this condition if you:  Go outdoors often in wooded or marshy areas.  Go outdoors without wearing protective clothing, such as closed shoes, long pants, and a long-sleeved shirt. What are the signs or symptoms? Symptoms of this condition include:  Redness of the skin.  Very bad itching.  A rash that often includes bumps and blisters. ? The rash usually appears 48 hours after exposure if you have been exposed before. ? If this is the first time you have been exposed, the rash may not appear until a week after exposure.  Swelling. This may occur if the reaction is very bad. Symptoms often clear up in 1-2 weeks. The first time you develop this condition, symptoms may last 3-4 weeks. How is this treated? This condition may be treated with:  Hydrocortisone creams or calamine lotions to help with itching.  Oatmeal baths to soothe the skin.  Medicines to help reduce itching (antihistamines). If you have a very bad reaction, you may also be given steroid medicines. Follow these instructions at home: Medicines  Take or apply over-the-counter and prescription medicines only as told by your doctor.  Use hydrocortisone creams or calamine lotion as needed to help with itching. General instructions  Do not scratch or rub your skin.  Put a cold, wet cloth (cold compress) on the affected areas or take baths in cool water. This will help with itching.  Avoid hot baths and showers.  Take oatmeal  baths as needed. Use colloidal oatmeal. You can get this at a pharmacy or grocery store. Follow the instructions on the package.  While you have the rash, wash your clothes right after you wear them.  Keep all follow-up visits as told by your doctor. This is important. How is this prevented?   Know what poison oak looks like so you can avoid it. ? This plant has three leaves with flowering branches on a single stem. ? The leaves are fuzzy. ? The edges of the leaves look like teeth.  If you have touched poison oak, wash your skin with soap and water right away. Be sure to wash under your fingernails.  When hiking or camping, wear long pants, a long-sleeved shirt, tall socks, and hiking boots. You can also use a lotion on your skin that helps to prevent contact with the chemical on the plant.  If you think that your clothes or outdoor gear came in contact with poison oak, rinse them off with a garden hose before you bring them inside your house.  When doing yard work or gardening, wear gloves, long sleeves, long pants, and boots. Wash your garden tools and gloves if they come in contact with poison oak.  If you think that your pet has come into contact with poison oak, wash him or her with pet shampoo and water. Make sure you wear gloves while washing your pet.  Do not burn poison oak plants. This can release the chemical from the plant into the air and   may cause a reaction. Contact a doctor if:  You have open sores in the rash area.  You have more redness, swelling, or pain in the affected area.  You have redness that spreads beyond the rash area.  You have fluid, blood, or pus coming from the affected area.  You have a fever.  You have a rash over a large area of your body.  You have a rash on your eyes, mouth, or genitals.  Your rash does not improve after a few weeks. Get help right away if:  Your face swells or your eyes swell shut.  You have trouble breathing.  You  have trouble swallowing. These symptoms may be an emergency. Do not wait to see if the symptoms will go away. Get medical help right away. Call your local emergency services (911 in the [place] Do not drive yourself to the hospital. Summary  Poison oak dermatitis is redness and soreness of the skin caused by chemicals in the leaves of the poison oak plant.  Symptoms of this condition include redness, very bad itching, a rash, and swelling.  Do not scratch or rub your skin.  Take or apply over-the-counter and prescription medicines only as told by your doctor. This information is not intended to replace advice given to you by your health care provider. Make sure you discuss any questions you have with your health care provider. Document Released: 07/08/2010 Document Revised: 09/27/2018 Document Reviewed: 07/05/2018 Elsevier Patient Education  2020 [employer]  

## 2018-12-23 ENCOUNTER — Ambulatory Visit (INDEPENDENT_AMBULATORY_CARE_PROVIDER_SITE_OTHER): Payer: Commercial Managed Care - PPO | Admitting: Family Medicine

## 2018-12-23 ENCOUNTER — Other Ambulatory Visit: Payer: Self-pay

## 2018-12-23 ENCOUNTER — Encounter: Payer: Self-pay | Admitting: Family Medicine

## 2018-12-23 VITALS — BP 122/79 | HR 83 | Temp 99.4°F | Ht 65.0 in | Wt 185.0 lb

## 2018-12-23 DIAGNOSIS — Z6830 Body mass index (BMI) 30.0-30.9, adult: Secondary | ICD-10-CM

## 2018-12-23 DIAGNOSIS — Z124 Encounter for screening for malignant neoplasm of cervix: Secondary | ICD-10-CM

## 2018-12-23 DIAGNOSIS — T466X5A Adverse effect of antihyperlipidemic and antiarteriosclerotic drugs, initial encounter: Secondary | ICD-10-CM | POA: Diagnosis not present

## 2018-12-23 DIAGNOSIS — R8290 Unspecified abnormal findings in urine: Secondary | ICD-10-CM

## 2018-12-23 DIAGNOSIS — M791 Myalgia, unspecified site: Secondary | ICD-10-CM

## 2018-12-23 DIAGNOSIS — Z01419 Encounter for gynecological examination (general) (routine) without abnormal findings: Secondary | ICD-10-CM

## 2018-12-23 DIAGNOSIS — Z01411 Encounter for gynecological examination (general) (routine) with abnormal findings: Secondary | ICD-10-CM | POA: Diagnosis not present

## 2018-12-23 LAB — URINALYSIS, COMPLETE
Bilirubin, UA: NEGATIVE
Glucose, UA: NEGATIVE
Ketones, UA: NEGATIVE
Nitrite, UA: NEGATIVE
Protein,UA: NEGATIVE
Specific Gravity, UA: 1.02 (ref 1.005–1.030)
Urobilinogen, Ur: 0.2 mg/dL (ref 0.2–1.0)
pH, UA: 6 (ref 5.0–7.5)

## 2018-12-23 LAB — MICROSCOPIC EXAMINATION: Renal Epithel, UA: NONE SEEN /hpf

## 2018-12-23 MED ORDER — ATORVASTATIN CALCIUM 20 MG PO TABS: 20.0000 mg | ORAL_TABLET | Freq: Every day | ORAL | 3 refills | Status: DC

## 2018-12-23 MED ORDER — MEDROXYPROGESTERONE ACETATE 150 MG/ML IM SUSY: 1.0000 mL | PREFILLED_SYRINGE | INTRAMUSCULAR | 3 refills | Status: DC

## 2018-12-23 MED ORDER — CEPHALEXIN 500 MG PO CAPS: 500.0000 mg | ORAL_CAPSULE | Freq: Two times a day (BID) | ORAL | 0 refills | Status: AC

## 2018-12-23 NOTE — Progress Notes (Signed)
Laurie Kelley is a 48 y.o. female presents to office today for annual physical exam examination.    Concerns today include: 1. Myalgia Patient reports that since she was started on Crestor 10 mg daily she has been having quite a bit of issues with myalgias.  She reports associated arthralgias as well.  She was previously on Zocor for a couple of years and tolerated this without difficulty.  She is wanting to know she can go back to Zocor or if her Crestor can be changed to something else.  She notes that the Crestor was started by cardiology.  2.  Right-sided chest pain Patient reports several month history of right-sided chest pain.  She was evaluate by cardiology as above.  This was determined to be noncardiac in nature but rather referred pain from her shoulder.  She has not yet followed up with [facility] for shoulder injection but plans to do this at some point.  She does report right upper extremity weakness, sensation changes.  3.  Cloudy urine Patient reports several day history of cloudy urine.  She thought this may be related to recent use of prednisone.  Denies any abnormal urination, dysuria, hematuria.  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 Refills needed today: Depo Immunizations needed UTD  Past Medical History:  Diagnosis Date  . Chest pain in adult 07/31/2017  . Esophageal reflux 08/18/2014  . Gallstones   . Hyperlipidemia with target LDL less than 100 08/18/2015  . Internal hemorrhoids 08/18/2014   Social History   Socioeconomic History  . Marital status: Single    Spouse name: Not on file  . Number of children: Not on file  . Years of education: Not on file  . Highest education level: Not on file   Occupational History  . Not on file  Social Needs  . Financial resource strain: Not on file  . Food insecurity    Worry: Not on file    Inability: Not on file  . Transportation needs    Medical: Not on file    Non-medical: Not on file  Tobacco Use  . Smoking status: Never Smoker  . Smokeless tobacco: Never Used  Substance and Sexual Activity  . Alcohol use: No  . Drug use: No  . Sexual activity: Not on file  Lifestyle  . Physical activity    Days per week: Not on file    Minutes per session: Not on file  . Stress: Not on file  Relationships  . Social Herbalist on phone: Not on file    Gets together: Not on file    Attends religious service: Not on file    Active member of club or organization: Not on file    Attends meetings of clubs or organizations: Not on file    Relationship status: Not on file  . Intimate partner violence    Fear of current or ex partner: Not on file    Emotionally abused: Not on file    Physically abused: Not on file    Forced sexual activity: Not on file  Other Topics Concern  . Not on file  Social History Narrative  . Not on file   Past Surgical History:  Procedure Laterality Date  . CHOLECYSTECTOMY  Family History  Problem Relation Age of Onset  . Hyperlipidemia Mother   . Heart disease Father   . Lung cancer Maternal Aunt   . Brain cancer Maternal Aunt   . Bone cancer Maternal Aunt     Current Outpatient Medications:  .  hydrOXYzine (ATARAX/VISTARIL) 25 MG tablet, Take 1 tablet (25 mg total) by mouth 3 (three) times daily as needed., Disp: 30 tablet, Rfl: 0 .  medroxyPROGESTERone Acetate 150 MG/ML SUSY, Inject 1 mL (150 mg total) into the muscle every 3 (three) months., Disp: 1 Syringe, Rfl: 2 .  predniSONE (STERAPRED UNI-PAK 21 TAB) 10 MG (21) TBPK tablet, As directed x 6 days, Disp: 21 tablet, Rfl: 0 .  Pumpkin Seed-Soy Germ (AZO BLADDER CONTROL/GO-LESS) CAPS, Take 1 capsule by mouth daily., Disp: , Rfl:  .   rosuvastatin (CRESTOR) 10 MG tablet, Take 1 tablet (10 mg total) by mouth daily., Disp: 90 tablet, Rfl: 3 .  VITAMIN D, ERGOCALCIFEROL, PO, Take 1 capsule by mouth daily. , Disp: , Rfl:   No Known Allergies   ROS: Review of Systems Constitutional: negative Eyes: positive for contacts/glasses and wears with computer Ears, nose, mouth, throat, and face: negative Respiratory: negative Cardiovascular: right sided Chest pain that is referred pain from right shoulder Gastrointestinal: negative Genitourinary:negative Integument/breast: negative Hematologic/lymphatic: negative Musculoskeletal:positive for arthralgias and myalgias Neurological: negative Behavioral/Psych: negative Endocrine: negative Allergic/Immunologic: negative    Physical exam BP 122/79   Pulse 83   Temp 99.4 F (37.4 C) (Oral)   Ht 5\' 5"  (1.651 m)   Wt 185 lb (83.9 kg)   BMI 30.79 kg/m  General appearance: alert, cooperative, appears stated age and no distress Head: Normocephalic, without obvious abnormality, atraumatic Eyes: negative findings: lids and lashes normal, conjunctivae and sclerae normal, corneas clear, pupils equal, round, reactive to light and accomodation and visual fields full to confrontation Ears: normal TM's and external ear canals both ears Nose: Nares normal. Septum midline. Mucosa normal. No drainage or sinus tenderness. Throat: lips, mucosa, and tongue normal; teeth and gums normal Neck: no adenopathy, no carotid bruit, supple, symmetrical, trachea midline and thyroid not enlarged, symmetric, no tenderness/mass/nodules Back: symmetric, no curvature. ROM normal. No CVA tenderness. Lungs: clear to auscultation bilaterally Heart: regular rate and rhythm, S1, S2 normal, no murmur, click, rub or gallop Abdomen: soft, non-tender; bowel sounds normal; no masses,  no organomegaly Pelvic: cervix normal in appearance, external genitalia normal, no adnexal masses or tenderness, no cervical motion  tenderness, rectovaginal septum normal, uterus normal size, shape, and consistency and vagina normal without discharge Extremities: extremities normal, atraumatic, no cyanosis or edema Pulses: 2+ and symmetric Skin: Skin color, texture, turgor normal. No rashes or lesions Lymph nodes: Cervical, supraclavicular, and axillary nodes normal. Neurologic: Alert and oriented X 3, normal strength and tone. Normal symmetric reflexes. Normal coordination and gait Psych: Mood stable, speech normal, affect appropriate, pleasant and interactive Depression screen [facility] 2/9 12/23/2018 12/16/2018 07/04/2018  Decreased Interest 0 0 0  Down, Depressed, Hopeless 0 0 0  PHQ - 2 Score 0 0 0  Altered sleeping 0 - -  Tired, decreased energy 0 - -  Change in appetite 0 - -  Feeling bad or failure about yourself  0 - -  Trouble concentrating 0 - -  Moving slowly or fidgety/restless 0 - -  Suicidal thoughts 0 - -  PHQ-9 Score 0 - -   The 10-year ASCVD risk score Mikey Bussing DC Jr., et al., 2013) is: 1.9%   Values  used to calculate the score:     Age: 76 years     Sex: Female     Is Non-Hispanic African American: Yes     Diabetic: No     Tobacco smoker: No     Systolic Blood Pressure: 624 mmHg     Is BP treated: No     HDL Cholesterol: 33 mg/dL     Total Cholesterol: 149 mg/dL  Assessment/ Plan: [place] here for annual physical exam.   1. Well woman exam with routine gynecological exam We discussed a cog recommendations for Pap smear.  Okay to wait for 3 years before next if this Pap smear is normal. - Pap IG and HPV (high risk) DNA detection  2. Screening for malignant neoplasm of cervix Scant bleeding on Pap today. - Pap IG and HPV (high risk) DNA detection  3. BMI 30.0-30.9,adult  4. Cloudy urine Leukocytes and bacteria noted.  Will empirically treat with Keflex x5 days.  Home care instructions reviewed.  Follow-up PRN. - Urinalysis, Complete  5. Myalgia due to statin Her ASCVD risk score is  1.9%.  Unsure that she needs a moderate dose statin.  I have switched her statin to Lipitor 20 mg daily in efforts to improve myalgias.  We discussed that she has ongoing myalgia okay to break in half to 10 mg.  If she continues to have issues we will plan to resort back to lower potency statin like pravastatin or Zocor.  Counseled on healthy lifestyle choices, including diet (rich in fruits, vegetables and lean meats and low in salt and simple carbohydrates) and exercise (at least 30 minutes of moderate physical activity daily).  Patient to follow up in 1 year for annual exam or sooner if needed.  Carmichael Burdette M. Lajuana Ripple, DO

## 2018-12-23 NOTE — Patient Instructions (Addendum)
You had labs performed today.  You will be contacted with the results of the labs once they are available, usually in the next 3 business days for routine lab work.  If you have an active my chart account, they will be released to your MyChart.  If you prefer to have these labs released to you via telephone, please let us know.  If you had a pap smear or biopsy performed, expect to be contacted in about 7-10 days.  Schedule your mammogram.   Health Maintenance, Female Adopting a healthy lifestyle and getting preventive care are important in promoting health and wellness. Ask your health care provider about:  The right schedule for you to have regular tests and exams.  Things you can do on your own to prevent diseases and keep yourself healthy. What should I know about diet, weight, and exercise? Eat a healthy diet   Eat a diet that includes plenty of vegetables, fruits, low-fat dairy products, and lean protein.  Do not eat a lot of foods that are high in solid fats, added sugars, or sodium. Maintain a healthy weight Body mass index (BMI) is used to identify weight problems. It estimates body fat based on height and weight. Your health care provider can help determine your BMI and help you achieve or maintain a healthy weight. Get regular exercise Get regular exercise. This is one of the most important things you can do for your health. Most adults should:  Exercise for at least 150 minutes each week. The exercise should increase your heart rate and make you sweat (moderate-intensity exercise).  Do strengthening exercises at least twice a week. This is in addition to the moderate-intensity exercise.  Spend less time sitting. Even light physical activity can be beneficial. Watch cholesterol and blood lipids Have your blood tested for lipids and cholesterol at 46 years of age, then have this test every 5 years. Have your cholesterol levels checked more often if:  Your lipid or  cholesterol levels are high.  You are older than 46 years of age.  You are at high risk for heart disease. What should I know about cancer screening? Depending on your health history and family history, you may need to have cancer screening at various ages. This may include screening for:  Breast cancer.  Cervical cancer.  Colorectal cancer.  Skin cancer.  Lung cancer. What should I know about heart disease, diabetes, and high blood pressure? Blood pressure and heart disease  High blood pressure causes heart disease and increases the risk of stroke. This is more likely to develop in people who have high blood pressure readings, are of African descent, or are overweight.  Have your blood pressure checked: ? Every 3-5 years if you are 49-31 years of age. ? Every year if you are 49 years old or older. Diabetes Have regular diabetes screenings. This checks your fasting blood sugar level. Have the screening done:  Once every three years after age 71 if you are at a normal weight and have a low risk for diabetes.  More often and at a younger age if you are overweight or have a high risk for diabetes. What should I know about preventing infection? Hepatitis B If you have a higher risk for hepatitis B, you should be screened for this virus. Talk with your health care provider to find out if you are at risk for hepatitis B infection. Hepatitis C Testing is recommended for:  Everyone born from [birth year] through 1963.  Anyone with known risk factors for hepatitis C. Sexually transmitted infections (STIs)  Get screened for STIs, including gonorrhea and chlamydia, if: ? You are sexually active and are younger than 46 years of age. ? You are older than 46 years of age and your health care provider tells you that you are at risk for this type of infection. ? Your sexual activity has changed since you were last screened, and you are at increased risk for chlamydia or gonorrhea. Ask your  health care provider if you are at risk.  Ask your health care provider about whether you are at high risk for HIV. Your health care provider may recommend a prescription medicine to help prevent HIV infection. If you choose to take medicine to prevent HIV, you should first get tested for HIV. You should then be tested every 3 months for as long as you are taking the medicine. Pregnancy  If you are about to stop having your period (premenopausal) and you may become pregnant, seek counseling before you get pregnant.  Take 400 to 800 micrograms (mcg) of folic acid every day if you become pregnant.  Ask for birth control (contraception) if you want to prevent pregnancy. Osteoporosis and menopause Osteoporosis is a disease in which the bones lose minerals and strength with aging. This can result in bone fractures. If you are 57 years old or older, or if you are at risk for osteoporosis and fractures, ask your health care provider if you should:  Be screened for bone loss.  Take a calcium or vitamin D supplement to lower your risk of fractures.  Be given hormone replacement therapy (HRT) to treat symptoms of menopause. Follow these instructions at home: Lifestyle  Do not use any products that contain nicotine or tobacco, such as cigarettes, e-cigarettes, and chewing tobacco. If you need help quitting, ask your health care provider.  Do not use street drugs.  Do not share needles.  Ask your health care provider for help if you need support or information about quitting drugs. Alcohol use  Do not drink alcohol if: ? Your health care provider tells you not to drink. ? You are pregnant, may be pregnant, or are planning to become pregnant.  If you drink alcohol: ? Limit how much you use to 0-1 drink a day. ? Limit intake if you are breastfeeding.  Be aware of how much alcohol is in your drink. In the [place] one drink equals one 12 oz bottle of beer (355 mL), one 5 oz glass of wine (148  mL), or one 1 oz glass of hard liquor (44 mL). General instructions  Schedule regular health, dental, and eye exams.  Stay current with your vaccines.  Tell your health care provider if: ? You often feel depressed. ? You have ever been abused or do not feel safe at home. Summary  Adopting a healthy lifestyle and getting preventive care are important in promoting health and wellness.  Follow your health care provider's instructions about healthy diet, exercising, and getting tested or screened for diseases.  Follow your health care provider's instructions on monitoring your cholesterol and blood pressure. This information is not intended to replace advice given to you by your health care provider. Make sure you discuss any questions you have with your health care provider. Document Released: 12/19/2010 Document Revised: 05/29/2018 Document Reviewed: 05/29/2018 Elsevier Patient Education  [employer].   Pap Test Why am I having this test? A Pap test, also called a Pap smear,  is a screening test to check for signs of:  Cancer of the vagina, cervix, and uterus. The cervix is the lower part of the uterus that opens into the vagina.  Infection.  Changes that may be a sign that cancer is developing (precancerous changes). Women need this test on a regular basis. In general, you should have a Pap test every 3 years until you reach menopause or age 101. Women aged 30-60 may choose to have their Pap test done at the same time as an HPV (human papillomavirus) test every 5 years (instead of every 3 years). Your health care provider may recommend having Pap tests more or less often depending on your medical conditions and past Pap test results. What kind of sample is taken?  Your health care provider will collect a sample of cells from the surface of your cervix. This will be done using a small cotton swab, plastic spatula, or brush. This sample is often collected during a pelvic exam,  when you are lying on your back on an exam table with feet in footrests (stirrups). In some cases, fluids (secretions) from the cervix or vagina may also be collected. How do I prepare for this test?  Be aware of where you are in your menstrual cycle. If you are menstruating on the day of the test, you may be asked to reschedule.  You may need to reschedule if you have a known vaginal infection on the day of the test.  Follow instructions from your health care provider about: ? Changing or stopping your regular medicines. Some medicines can cause abnormal test results, such as digitalis and tetracycline. ? Avoiding douching or taking a bath the day before or the day of the test. Tell a health care provider about:  Any allergies you have.  All medicines you are taking, including vitamins, herbs, eye drops, creams, and over-the-counter medicines.  Any blood disorders you have.  Any surgeries you have had.  Any medical conditions you have.  Whether you are pregnant or may be pregnant. How are the results reported? Your test results will be reported as either abnormal or normal. A false-positive result can occur. A false positive is incorrect because it means that a condition is present when it is not. A false-negative result can occur. A false negative is incorrect because it means that a condition is not present when it is. What do the results mean? A normal test result means that you do not have signs of cancer of the vagina, cervix, or uterus. An abnormal result may mean that you have:  Cancer. A Pap test by itself is not enough to diagnose cancer. You will have more tests done in this case.  Precancerous changes in your vagina, cervix, or uterus.  Inflammation of the cervix.  An STD (sexually transmitted disease).  A fungal infection.  A parasite infection. Talk with your health care provider about what your results mean. Questions to ask your health care provider Ask  your health care provider, or the department that is doing the test:  When will my results be ready?  How will I get my results?  What are my treatment options?  What other tests do I need?  What are my next steps? Summary  In general, women should have a Pap test every 3 years until they reach menopause or age 12.  Your health care provider will collect a sample of cells from the surface of your cervix. This will be done using a  small cotton swab, plastic spatula, or brush.  In some cases, fluids (secretions) from the cervix or vagina may also be collected. This information is not intended to replace advice given to you by your health care provider. Make sure you discuss any questions you have with your health care provider. Document Released: 08/26/2002 Document Revised: 02/12/2017 Document Reviewed: 02/12/2017 Elsevier Patient Education  2020 [employer].

## 2018-12-28 LAB — PAP IG AND HPV HIGH-RISK: HPV, high-risk: NEGATIVE

## 2019-01-24 ENCOUNTER — Encounter: Payer: Self-pay | Admitting: Family

## 2019-01-24 ENCOUNTER — Ambulatory Visit (INDEPENDENT_AMBULATORY_CARE_PROVIDER_SITE_OTHER): Payer: Commercial Managed Care - PPO | Admitting: Family

## 2019-01-24 DIAGNOSIS — R399 Unspecified symptoms and signs involving the genitourinary system: Secondary | ICD-10-CM

## 2019-01-24 MED ORDER — SULFAMETHOXAZOLE-TRIMETHOPRIM 800-160 MG PO TABS: 1.0000 | ORAL_TABLET | Freq: Two times a day (BID) | ORAL | 0 refills | Status: DC

## 2019-01-24 MED ORDER — CEPHALEXIN 500 MG PO CAPS: 500.0000 mg | ORAL_CAPSULE | Freq: Two times a day (BID) | ORAL | 0 refills | Status: DC

## 2019-01-24 NOTE — Progress Notes (Signed)
   Virtual Visit via telephone Note Due to COVID-19 pandemic this visit was conducted virtually. This visit type was conducted due to national recommendations for restrictions regarding the COVID-19 Pandemic (e.g. social distancing, sheltering in place) in an effort to limit this patient's exposure and mitigate transmission in our community. All issues noted in this document were discussed and addressed.  A physical exam was not performed with this format.  I connected with Laurie Kelley on 01/24/19 at 10:31 AM  by telephone and verified that I am speaking with the correct person using two identifiers. Laurie Kelley is currently located at home  and no one  is currently with her during visit. The provider, Evelina Dun, FNP is located in their office at time of visit.  I discussed the limitations, risks, security and privacy concerns of performing an evaluation and management service by telephone and the availability of in person appointments. I also discussed with the patient that there may be a patient responsible charge related to this service. The patient expressed understanding and agreed to proceed.   History and Present Illness:  Pt calls the office today with recurrent UTI. She was seen on 12/23/18 and started Keflex. She states she was feeling better for a few weeks, but over the last two weeks she has had urinary frequency, dysuria, and urgency.  Dysuria  This is a new problem. The current episode started 1 to 4 weeks ago. The problem occurs every urination. The problem has been waxing and waning. The quality of the pain is described as burning. The pain is mild. Associated symptoms include frequency, hesitancy and urgency. Pertinent negatives include no flank pain, hematuria, nausea or vomiting. She has tried increased fluids for the symptoms. The treatment provided mild relief.      Review of Systems  Gastrointestinal: Negative for nausea and vomiting.  Genitourinary: Positive  for dysuria, frequency, hesitancy and urgency. Negative for flank pain and hematuria.  All other systems reviewed and are negative.    Observations/Objective: No SOB or distress noted   Assessment and Plan: 1. UTI symptoms Force fluids AZO over the counter X2 days RTO if symptoms do not improve or worsen. We will need to do urine culture if symptoms reoccur  - cephALEXin (KEFLEX) 500 MG capsule; Take 1 capsule (500 mg total) by mouth 2 (two) times daily.  Dispense: 14 capsule; Refill: 0 - sulfamethoxazole-trimethoprim (BACTRIM DS) 800-160 MG tablet; Take 1 tablet by mouth 2 (two) times daily.  Dispense: 14 tablet; Refill: 0     I discussed the assessment and treatment plan with the patient. The patient was provided an opportunity to ask questions and all were answered. The patient agreed with the plan and demonstrated an understanding of the instructions.   The patient was advised to call back or seek an in-person evaluation if the symptoms worsen or if the condition fails to improve as anticipated.  The above assessment and management plan was discussed with the patient. The patient verbalized understanding of and has agreed to the management plan. Patient is aware to call the clinic if symptoms persist or worsen. Patient is aware when to return to the clinic for a follow-up visit. Patient educated on when it is appropriate to go to the emergency department.   Time call ended:  10:40AM  I provided 9 minutes of non-face-to-face time during this encounter.    Evelina Dun, FNP

## 2019-02-04 ENCOUNTER — Encounter: Payer: Self-pay | Admitting: Family

## 2019-02-04 ENCOUNTER — Ambulatory Visit (INDEPENDENT_AMBULATORY_CARE_PROVIDER_SITE_OTHER): Payer: Commercial Managed Care - PPO | Admitting: Family

## 2019-02-04 DIAGNOSIS — R1030 Lower abdominal pain, unspecified: Secondary | ICD-10-CM

## 2019-02-04 DIAGNOSIS — N390 Urinary tract infection, site not specified: Secondary | ICD-10-CM

## 2019-02-04 LAB — MICROSCOPIC EXAMINATION
Epithelial Cells (non renal): >10 /hpf — AB (ref 0–10)
Renal Epithel, UA: NONE SEEN /hpf

## 2019-02-04 LAB — CBC WITH DIFFERENTIAL/PLATELET
Basophils Absolute: 0.1 10*3/uL (ref 0.0–0.2)
Basos: 1 %
EOS (ABSOLUTE): 0 10*3/uL (ref 0.0–0.4)
Eos: 1 %
Hematocrit: 42.2 % (ref 34.0–46.6)
Hemoglobin: 13.4 g/dL (ref 11.1–15.9)
Immature Grans (Abs): 0.1 10*3/uL (ref 0.0–0.1)
Immature Granulocytes: 1 %
Lymphocytes Absolute: 1.9 10*3/uL (ref 0.7–3.1)
Lymphs: 28 %
MCH: 28.6 pg (ref 26.6–33.0)
MCHC: 31.8 g/dL (ref 31.5–35.7)
MCV: 90 fL (ref 79–97)
Monocytes Absolute: 0.3 10*3/uL (ref 0.1–0.9)
Monocytes: 5 %
Neutrophils Absolute: 4.5 10*3/uL (ref 1.4–7.0)
Neutrophils: 64 %
Platelets: 304 10*3/uL (ref 150–450)
RBC: 4.68 x10E6/uL (ref 3.77–5.28)
RDW: 13.2 % (ref 11.7–15.4)
WBC: 6.9 10*3/uL (ref 3.4–10.8)

## 2019-02-04 LAB — URINALYSIS, COMPLETE
Bilirubin, UA: NEGATIVE
Glucose, UA: NEGATIVE
Ketones, UA: NEGATIVE
Nitrite, UA: NEGATIVE
RBC, UA: NEGATIVE
Specific Gravity, UA: 1.025 (ref 1.005–1.030)
Urobilinogen, Ur: 0.2 mg/dL (ref 0.2–1.0)
pH, UA: 6.5 (ref 5.0–7.5)

## 2019-02-04 MED ORDER — CIPROFLOXACIN HCL 500 MG PO TABS: 500.0000 mg | ORAL_TABLET | Freq: Two times a day (BID) | ORAL | 0 refills | Status: DC

## 2019-02-04 NOTE — Progress Notes (Signed)
   Virtual Visit via telephone Note Due to COVID-19 pandemic this visit was conducted virtually. This visit type was conducted due to national recommendations for restrictions regarding the COVID-19 Pandemic (e.g. social distancing, sheltering in place) in an effort to limit this patient's exposure and mitigate transmission in our community. All issues noted in this document were discussed and addressed.  A physical exam was not performed with this format.  I connected with Laurie Kelley on 02/04/19 at 9:40 AM by telephone and verified that I am speaking with the correct person using two identifiers. Laurie Kelley is currently located at home and no one is currently with her during visit. The provider, Evelina Dun, FNP is located in their office at time of visit.  I discussed the limitations, risks, security and privacy concerns of performing an evaluation and management service by telephone and the availability of in person appointments. I also discussed with the patient that there may be a patient responsible charge related to this service. The patient expressed understanding and agreed to proceed.   History and Present Illness:   . Urinary Tract Infection  This is a recurrent problem. The current episode started 1 to 4 weeks ago. The problem occurs intermittently. The problem has been waxing and waning. The quality of the pain is described as burning. The pain is at a severity of 8/10. There has been no fever. Associated symptoms include nausea, sweats and urgency. Pertinent negatives include no frequency (improved), hematuria or vomiting. She has tried increased fluids and antibiotics for the symptoms. The treatment provided mild relief.      Review of Systems  Gastrointestinal: Positive for nausea. Negative for vomiting.  Genitourinary: Positive for urgency. Negative for frequency (improved) and hematuria.  All other systems reviewed and are negative.    Observations/Objective:  No SOB or distress  Assessment and Plan: 1. Recurrent UTI Force fluids Culture pending - Urinalysis, Complete - Urine Culture - CBC with Differential/Platelet - ciprofloxacin (CIPRO) 500 MG tablet; Take 1 tablet (500 mg total) by mouth 2 (two) times daily.  Dispense: 14 tablet; Refill: 0  2. Lower abdominal pain - Urinalysis, Complete - Urine Culture - CBC with Differential/Platelet  Pt will come in today and leave urine sample Start Cipro after leaving urine sample RTO if symptoms worsen or do not improve    I discussed the assessment and treatment plan with the patient. The patient was provided an opportunity to ask questions and all were answered. The patient agreed with the plan and demonstrated an understanding of the instructions.   The patient was advised to call back or seek an in-person evaluation if the symptoms worsen or if the condition fails to improve as anticipated.  The above assessment and management plan was discussed with the patient. The patient verbalized understanding of and has agreed to the management plan. Patient is aware to call the clinic if symptoms persist or worsen. Patient is aware when to return to the clinic for a follow-up visit. Patient educated on when it is appropriate to go to the emergency department.   Time call ended:  9:54 AM  I provided 14 minutes of non-face-to-face time during this encounter.    Evelina Dun, FNP

## 2019-02-05 LAB — URINE CULTURE

## 2019-02-12 ENCOUNTER — Telehealth: Payer: Self-pay | Admitting: Family Medicine

## 2019-02-12 ENCOUNTER — Ambulatory Visit (INDEPENDENT_AMBULATORY_CARE_PROVIDER_SITE_OTHER): Payer: Commercial Managed Care - PPO | Admitting: Family Medicine

## 2019-02-12 ENCOUNTER — Other Ambulatory Visit: Payer: Self-pay

## 2019-02-12 ENCOUNTER — Encounter: Payer: Self-pay | Admitting: Family Medicine

## 2019-02-12 VITALS — BP 135/77 | HR 83 | Temp 98.6°F | Ht 65.0 in | Wt 183.0 lb

## 2019-02-12 DIAGNOSIS — N3090 Cystitis, unspecified without hematuria: Secondary | ICD-10-CM

## 2019-02-12 DIAGNOSIS — R1030 Lower abdominal pain, unspecified: Secondary | ICD-10-CM | POA: Diagnosis not present

## 2019-02-12 DIAGNOSIS — R3129 Other microscopic hematuria: Secondary | ICD-10-CM | POA: Diagnosis not present

## 2019-02-12 LAB — URINALYSIS, COMPLETE
Bilirubin, UA: NEGATIVE
Glucose, UA: NEGATIVE
Ketones, UA: NEGATIVE
Leukocytes,UA: NEGATIVE
Nitrite, UA: NEGATIVE
Protein,UA: NEGATIVE
Specific Gravity, UA: 1.025 (ref 1.005–1.030)
Urobilinogen, Ur: 0.2 mg/dL (ref 0.2–1.0)
pH, UA: 6 (ref 5.0–7.5)

## 2019-02-12 LAB — MICROSCOPIC EXAMINATION
Epithelial Cells (non renal): >10 /hpf — AB (ref 0–10)
Renal Epithel, UA: NONE SEEN /hpf

## 2019-02-12 NOTE — Patient Instructions (Signed)
I have sent your urine off for another culture.  Your last urine culture was negative.  If your urine grows bacteria again, will plan for repeat antibiotics.  If you develop fever, nausea, gross blood in your urine, call me ASAP or seek immediate medical attention in the ED/ urgent care.  We discussed need for Urologic evaluation.   We discussed possible diagnoses.  If their workup is negative, we may have you see a GYN to rule out endometriosis.   Interstitial Cystitis  Interstitial cystitis is inflammation of the bladder. This may cause pain in the bladder area as well as a frequent and urgent need to urinate. The bladder is a hollow organ in the lower part of the abdomen. It stores urine after the urine is made in the kidneys. The severity of interstitial cystitis can vary from person to person. You may have flare-ups, and then your symptoms may go away for a while. For many people, it becomes a long-term (chronic) problem. What are the causes? The cause of this condition is not known. What increases the risk? The following factors may make you more likely to develop this condition:  You are female.  You have fibromyalgia.  You have irritable bowel syndrome (IBS).  You have endometriosis. This condition may be aggravated by:  Stress.  Smoking.  Spicy foods. What are the signs or symptoms? Symptoms of interstitial cystitis vary, and they can change over time. Symptoms may include:  Discomfort or pain in the bladder area, which is in the lower abdomen. Pain can range from mild to severe. The pain may change in intensity as the bladder fills with urine or as it empties.  Pain in the pelvic area, between the hip bones.  An urgent need to urinate.  Frequent urination.  Pain during urination.  Pain during sex.  Blood in the urine. For women, symptoms often get worse during menstruation. How is this diagnosed? This condition is diagnosed based on your symptoms, your  medical history, and a physical exam. You may have tests to rule out other conditions, such as:  Urine tests.  Cystoscopy. For this test, a tool similar to a very thin telescope is used to look into your bladder.  Biopsy. This involves taking a sample of tissue from the bladder to be examined under a microscope. How is this treated? There is no cure for this condition, but treatment can help you control your symptoms. Work closely with your health care provider to find the most effective treatments for you. Treatment options may include:  Medicines to relieve pain and reduce how often you feel the need to urinate.  Learning ways to control when you urinate (bladder training).  Lifestyle changes, such as changing your diet or taking steps to control stress.  Using a device that provides electrical stimulation to your nerves, which can relieve pain (neuromodulation therapy). The device is placed on your back, where it blocks the nerves that cause you to feel pain in your bladder area.  A procedure that stretches your bladder by filling it with air or fluid.  Surgery. This is rare. It is only done for extreme cases, if other treatments do not help. Follow these instructions at home: Bladder training   Use bladder training techniques as directed. Techniques may include: ? Urinating at scheduled times. ? Training yourself to delay urination. ? Doing exercises (Kegel exercises) to strengthen the muscles that control urine flow.  Keep a bladder diary. ? Write down the times that  you urinate and any symptoms that you have. This can help you find out which foods, liquids, or activities make your symptoms worse. ? Use your bladder diary to schedule bathroom trips. If you are away from home, plan to be near a bathroom at each of your scheduled times.  Make sure that you urinate just before you leave the house and just before you go to bed. Eating and drinking  Make dietary changes as  recommended by your health care provider. You may need to avoid: ? Spicy foods. ? Foods that contain a lot of potassium.  Limit your intake of beverages that make you need to urinate. These include: ? Caffeinated beverages like soda, coffee, and tea. ? Alcohol. General instructions  Take over-the-counter and prescription medicines only as told by your health care provider.  Do not drink alcohol.  You can try a warm or cool compress over your bladder for comfort.  Avoid wearing tight clothing.  Do not use any products that contain nicotine or tobacco, such as cigarettes and e-cigarettes. If you need help quitting, ask your health care provider.  Keep all follow-up visits as told by your health care provider. This is important. Contact a health care provider if you have:  Symptoms that do not get better with treatment.  Pain or discomfort that gets worse.  More frequent urges to urinate.  A fever. Get help right away if:  You have no control over when you urinate. Summary  Interstitial cystitis is inflammation of the bladder.  This condition may cause pain in the bladder area as well as a frequent and urgent need to urinate.  You may have flare-ups of the condition, and then it may go away for a while. For many people, it becomes a long-term (chronic) problem.  There is no cure for interstitial cystitis, but treatment methods are available to control your symptoms. This information is not intended to replace advice given to you by your health care provider. Make sure you discuss any questions you have with your health care provider. Document Released: 02/04/2004 Document Revised: 05/18/2017 Document Reviewed: 04/30/2017 Elsevier Patient Education  2020 [employer].

## 2019-02-12 NOTE — Telephone Encounter (Signed)
Will cc to [facility].

## 2019-02-12 NOTE — Progress Notes (Signed)
Subjective: CC: ?UTI PCP: Janora Norlander, DO [id number] Laurie Kelley is a 48 y.o. female presenting to clinic today for:  1. Abdominal pain Patient reports ongoing lower abdominal pain that is worse with certain activities.  She notes that she has been treated for UTI x3 now.  Her last urine culture had no significant growth despite her urinalysis showing many bacteria and 6-10 red blood cells on microscopy.  She denies any abnormal vaginal discharge, bleeding.  No change in bowel habits.  Denies constipation or diarrhea.  She is not a smoker.  She has had a cholecystectomy.  No history of renal stones.  Her mother has some unknown renal disease.   ROS: Per HPI  No Known Allergies Past Medical History:  Diagnosis Date  . Chest pain in adult 07/31/2017  . Esophageal reflux 08/18/2014  . Gallstones   . Hyperlipidemia with target LDL less than 100 08/18/2015  . Internal hemorrhoids 08/18/2014    Current Outpatient Medications:  .  atorvastatin (LIPITOR) 20 MG tablet, Take 1 tablet (20 mg total) by mouth daily., Disp: 90 tablet, Rfl: 3 .  magnesium oxide (MAG-OX) 400 MG tablet, Take 400 mg by mouth daily., Disp: , Rfl:  .  medroxyPROGESTERone Acetate 150 MG/ML SUSY, Inject 1 mL (150 mg total) into the muscle every 3 (three) months., Disp: 1 mL, Rfl: 3 .  Pumpkin Seed-Soy Germ (AZO BLADDER CONTROL/GO-LESS) CAPS, Take 1 capsule by mouth daily., Disp: , Rfl:  .  VITAMIN D, ERGOCALCIFEROL, PO, Take 1 capsule by mouth daily. , Disp: , Rfl:  Social History   Socioeconomic History  . Marital status: Single    Spouse name: Not on file  . Number of children: Not on file  . Years of education: Not on file  . Highest education level: Not on file  Occupational History  . Not on file  Social Needs  . Financial resource strain: Not on file  . Food insecurity    Worry: Not on file    Inability: Not on file  . Transportation needs    Medical: Not on file    Non-medical: Not on file  Tobacco  Use  . Smoking status: Never Smoker  . Smokeless tobacco: Never Used  Substance and Sexual Activity  . Alcohol use: No  . Drug use: No  . Sexual activity: Not Currently  Lifestyle  . Physical activity    Days per week: Not on file    Minutes per session: Not on file  . Stress: Not on file  Relationships  . Social Herbalist on phone: Not on file    Gets together: Not on file    Attends religious service: Not on file    Active member of club or organization: Not on file    Attends meetings of clubs or organizations: Not on file    Relationship status: Not on file  . Intimate partner violence    Fear of current or ex partner: Not on file    Emotionally abused: Not on file    Physically abused: Not on file    Forced sexual activity: Not on file  Other Topics Concern  . Not on file  Social History Narrative  . Not on file   Family History  Problem Relation Age of Onset  . Hyperlipidemia Mother   . Heart disease Father   . Lung cancer Maternal Aunt   . Brain cancer Maternal Aunt   . Bone cancer Maternal Aunt  Objective: Office vital signs reviewed. BP 135/77   Pulse 83   Temp 98.6 F (37 C) (Oral)   Ht 5\' 5"  (1.651 m)   Wt 183 lb (83 kg)   BMI 30.45 kg/m   Physical Examination:  General: Awake, alert, well nourished,nontoxic. No acute distress HEENT: Normal, sclera white GI: soft, +lower abdominal TTP. No peritoneal signs, non-distended, bowel sounds present x4, no hepatomegaly, no splenomegaly, no masses GU: +suprapubic TTP  Assessment/ Plan: 49 y.o. female   1. Lower abdominal pain I reviewed her last several office notes, urine studies.  Possibly related to subacute cystitis.  However, her urine culture has been negative to date.  Her urinalysis today again shows 3-10 red blood cells on microscopy and bacteria.  We discussed possible differential diagnosis including ongoing urinary tract infection (though she has completed 3 courses of  antibiotics since July) versus interstitial cystitis versus bladder stones versus possible bladder growths including malignancy (noknown family history of bladder cancer.  Patient is a non-smoker) vs endometriosis vs fibromyalgia/ IBS-D - Urinalysis, Complete - Urine Culture - Ambulatory referral to Urology  2. Recurrent cystitis - Ambulatory referral to Urology  3. Other microscopic hematuria - Ambulatory referral to Urology   Orders Placed This Encounter  Procedures  . Urine Culture  . Urinalysis, Complete  . Ambulatory referral to Urology    Referral Priority:   Urgent    Referral Type:   Consultation    Referral Reason:   Specialty Services Required    Requested Specialty:   Urology    Number of Visits Requested:   1   No orders of the defined types were placed in this encounter.    Janora Norlander, DO [place] [phone]

## 2019-02-13 ENCOUNTER — Telehealth: Payer: Self-pay | Admitting: Family Medicine

## 2019-02-13 NOTE — Telephone Encounter (Signed)
Entered request to see Dr. Tammi Klippel in [facility] Notes.

## 2019-02-13 NOTE — Telephone Encounter (Signed)
Can't see urology until Sept 17th. Wants to know if she can have something to ease pain until she can get to appt or if there is anything you can suggest like Tylenol? Please advise. If can't reach patient please leave voicemail with details

## 2019-02-14 ENCOUNTER — Other Ambulatory Visit: Payer: Self-pay | Admitting: Family Medicine

## 2019-02-14 DIAGNOSIS — R102 Pelvic and perineal pain: Secondary | ICD-10-CM

## 2019-02-14 MED ORDER — PHENAZOPYRIDINE HCL 200 MG PO TABS: 200.0000 mg | ORAL_TABLET | Freq: Three times a day (TID) | ORAL | 0 refills | Status: DC | PRN

## 2019-02-14 MED ORDER — DICLOFENAC SODIUM 75 MG PO TBEC: 75.0000 mg | DELAYED_RELEASE_TABLET | Freq: Two times a day (BID) | ORAL | 0 refills | Status: DC | PRN

## 2019-02-14 NOTE — Telephone Encounter (Signed)
I am sending in voltaren. Tell her to take with a full meal to prevent GI upset.  Avoid other NSAIDs.  Tylenol ok to take with the voltaren if she needs.

## 2019-02-14 NOTE — Telephone Encounter (Signed)
Patient aware and verbalizes understanding. 

## 2019-02-18 ENCOUNTER — Other Ambulatory Visit: Payer: Self-pay

## 2019-02-21 ENCOUNTER — Ambulatory Visit (INDEPENDENT_AMBULATORY_CARE_PROVIDER_SITE_OTHER): Payer: Commercial Managed Care - PPO

## 2019-02-21 ENCOUNTER — Other Ambulatory Visit: Payer: Self-pay

## 2019-02-21 DIAGNOSIS — Z3042 Encounter for surveillance of injectable contraceptive: Secondary | ICD-10-CM | POA: Diagnosis not present

## 2019-02-21 DIAGNOSIS — Z23 Encounter for immunization: Secondary | ICD-10-CM

## 2019-02-21 MED ORDER — MEDROXYPROGESTERONE ACETATE 150 MG/ML IM SUSP
150.0000 mg | INTRAMUSCULAR | Status: AC
  Administered 2019-02-21 – 2020-01-20 (×4): 150 mg via INTRAMUSCULAR

## 2019-02-21 NOTE — Progress Notes (Signed)
Medroxyprogestore injection given to left upper outer quadrant Patient tolerated well

## 2019-02-21 NOTE — Patient Instructions (Signed)
Medroxyprogesterone injection [Contraceptive] What is this medicine? MEDROXYPROGESTERONE (me DROX ee proe JES te rone) contraceptive injections prevent pregnancy. They provide effective birth control for 3 months. Depo-subQ Provera 104 is also used for treating pain related to endometriosis. This medicine may be used for other purposes; ask your health care provider or pharmacist if you have questions. COMMON BRAND NAME(S): Depo-Provera, Depo-subQ Provera 104 What should I tell my health care provider before I take this medicine? They need to know if you have any of these conditions:  frequently drink alcohol  asthma  blood vessel disease or a history of a blood clot in the lungs or legs  bone disease such as osteoporosis  breast cancer  diabetes  eating disorder (anorexia nervosa or bulimia)  high blood pressure  HIV infection or AIDS  kidney disease  liver disease  mental depression  migraine  seizures (convulsions)  stroke  tobacco smoker  vaginal bleeding  an unusual or allergic reaction to medroxyprogesterone, other hormones, medicines, foods, dyes, or preservatives  pregnant or trying to get pregnant  breast-feeding How should I use this medicine? Depo-Provera Contraceptive injection is given into a muscle. Depo-subQ Provera 104 injection is given under the skin. These injections are given by a health care professional. You must not be pregnant before getting an injection. The injection is usually given during the first 5 days after the start of a menstrual period or 6 weeks after delivery of a baby. Talk to your pediatrician regarding the use of this medicine in children. Special care may be needed. These injections have been used in female children who have started having menstrual periods. Overdosage: If you think you have taken too much of this medicine contact a poison control center or emergency room at once. NOTE: This medicine is only for you. Do not  share this medicine with others. What if I miss a dose? Try not to miss a dose. You must get an injection once every 3 months to maintain birth control. If you cannot keep an appointment, call and reschedule it. If you wait longer than 13 weeks between Depo-Provera contraceptive injections or longer than 14 weeks between Depo-subQ Provera 104 injections, you could get pregnant. Use another method for birth control if you miss your appointment. You may also need a pregnancy test before receiving another injection. What may interact with this medicine? Do not take this medicine with any of the following medications:  bosentan This medicine may also interact with the following medications:  aminoglutethimide  antibiotics or medicines for infections, especially rifampin, rifabutin, rifapentine, and griseofulvin  aprepitant  barbiturate medicines such as phenobarbital or primidone  bexarotene  carbamazepine  medicines for seizures like ethotoin, felbamate, oxcarbazepine, phenytoin, topiramate  modafinil  St. John's wort This list may not describe all possible interactions. Give your health care provider a list of all the medicines, herbs, non-prescription drugs, or dietary supplements you use. Also tell them if you smoke, drink alcohol, or use illegal drugs. Some items may interact with your medicine. What should I watch for while using this medicine? This drug does not protect you against HIV infection (AIDS) or other sexually transmitted diseases. Use of this product may cause you to lose calcium from your bones. Loss of calcium may cause weak bones (osteoporosis). Only use this product for more than 2 years if other forms of birth control are not right for you. The longer you use this product for birth control the more likely you will be at risk   for weak bones. Ask your health care professional how you can keep strong bones. You may have a change in bleeding pattern or irregular periods.  Many females stop having periods while taking this drug. If you have received your injections on time, your chance of being pregnant is very low. If you think you may be pregnant, see your health care professional as soon as possible. Tell your health care professional if you want to get pregnant within the next year. The effect of this medicine may last a long time after you get your last injection. What side effects may I notice from receiving this medicine? Side effects that you should report to your doctor or health care professional as soon as possible:  allergic reactions like skin rash, itching or hives, swelling of the face, lips, or tongue  breast tenderness or discharge  breathing problems  changes in vision  depression  feeling faint or lightheaded, falls  fever  pain in the abdomen, chest, groin, or leg  problems with balance, talking, walking  unusually weak or tired  yellowing of the eyes or skin Side effects that usually do not require medical attention (report to your doctor or health care professional if they continue or are bothersome):  acne  fluid retention and swelling  headache  irregular periods, spotting, or absent periods  temporary pain, itching, or skin reaction at site where injected  weight gain This list may not describe all possible side effects. Call your doctor for medical advice about side effects. You may report side effects to FDA at 1-800-FDA-1088. Where should I keep my medicine? This does not apply. The injection will be given to you by a health care professional. NOTE: This sheet is a summary. It may not cover all possible information. If you have questions about this medicine, talk to your doctor, pharmacist, or health care provider.  2020 Elsevier/Gold Standard (2008-06-26 18:37:56)  

## 2019-03-25 NOTE — Addendum Note (Signed)
Addended by: Earlene Plater on: 03/25/2019 09:26 AM   Modules accepted: Orders

## 2019-05-21 ENCOUNTER — Other Ambulatory Visit: Payer: Self-pay

## 2019-05-21 ENCOUNTER — Ambulatory Visit (INDEPENDENT_AMBULATORY_CARE_PROVIDER_SITE_OTHER): Payer: Commercial Managed Care - PPO

## 2019-05-21 DIAGNOSIS — Z3042 Encounter for surveillance of injectable contraceptive: Secondary | ICD-10-CM | POA: Diagnosis not present

## 2019-05-21 NOTE — Progress Notes (Signed)
Medroxyprogesterone injection given to right upper outer quadrant.  Patient tolerated well. 

## 2019-08-13 ENCOUNTER — Other Ambulatory Visit: Payer: Self-pay

## 2019-08-13 ENCOUNTER — Ambulatory Visit (INDEPENDENT_AMBULATORY_CARE_PROVIDER_SITE_OTHER): Payer: Commercial Managed Care - PPO

## 2019-08-13 DIAGNOSIS — Z3042 Encounter for surveillance of injectable contraceptive: Secondary | ICD-10-CM

## 2019-08-13 NOTE — Progress Notes (Signed)
Medroxyprogesterone injection given to right upper outer quadrant.  Patient tolerated well. 

## 2019-09-10 ENCOUNTER — Ambulatory Visit: Payer: Commercial Managed Care - PPO

## 2019-09-10 ENCOUNTER — Ambulatory Visit (INDEPENDENT_AMBULATORY_CARE_PROVIDER_SITE_OTHER): Payer: Commercial Managed Care - PPO | Admitting: Orthopedic Surgery

## 2019-09-10 ENCOUNTER — Encounter: Payer: Self-pay | Admitting: Orthopedic Surgery

## 2019-09-10 ENCOUNTER — Other Ambulatory Visit: Payer: Self-pay

## 2019-09-10 VITALS — BP 138/88 | HR 71 | Ht 65.0 in | Wt 176.0 lb

## 2019-09-10 DIAGNOSIS — M62838 Other muscle spasm: Secondary | ICD-10-CM

## 2019-09-10 DIAGNOSIS — M7531 Calcific tendinitis of right shoulder: Secondary | ICD-10-CM | POA: Diagnosis not present

## 2019-09-10 DIAGNOSIS — M25511 Pain in right shoulder: Secondary | ICD-10-CM | POA: Diagnosis not present

## 2019-09-10 DIAGNOSIS — G8929 Other chronic pain: Secondary | ICD-10-CM

## 2019-09-10 MED ORDER — TIZANIDINE HCL 4 MG PO TABS: 4.0000 mg | ORAL_TABLET | Freq: Every day | ORAL | 1 refills | Status: DC

## 2019-09-10 MED ORDER — MELOXICAM 7.5 MG PO TABS: 7.5000 mg | ORAL_TABLET | Freq: Every day | ORAL | 5 refills | Status: DC

## 2019-09-10 NOTE — Patient Instructions (Signed)
Calcific Tendinitis Calcific tendinitis occurs when crystals of calcium are deposited in a tendon. Tendons are tough, cord-like tissues that connect muscle to bone. Tendons are an important part of joints. They make joints move, and they absorb some of the stress that a joint receives during use. When calcium is deposited in the tendon, the tendon becomes stiff and painful and it can become swollen. Calcific tendinitis occurs frequently in a tendon in the shoulder joint (rotator cuff). What are the causes? The cause of calcific tendinitis is not known. It may be associated with:  Overusing a tendon, such as from repetitive motion.  Excess stress on the tendon.  Age-related wear and tear.  Repetitive, mild injuries. What increases the risk? This condition is more likely to develop in:  People who do activities that involve repetitive motions.  Older people. What are the signs or symptoms? This condition may or may not be painful. If there is pain, it may occur when moving the joint. Other symptoms may include:  Tenderness when pressure is applied to the tendon.  A snapping or popping sound when the joint moves.  Decreased motion of the joint.  Difficulty sleeping due to pain in the joint. How is this diagnosed? This condition is diagnosed with a physical exam. You may also have tests, such as:  X-rays.  MRI.  CT scan. How is this treated? This condition generally gets better on its own. Treatment may also include:  Resting, icing, applying pressure (compression), and raising (elevating) the area above the level of your heart. This is known as RICE therapy.  Medicines to help reduce inflammation or to help reduce pain.  Physical therapy to strengthen and stretch the tendon.  Following a specific exercise program to keep the joint working properly. Treatment for more severe calcific tendinitis may require:  Injecting steroids or pain-relieving medicines into or around the  joint.  Manipulating the joint after you are given medicine to numb the area (local anesthetic).  Inflating the joint with sterile fluid to increase the flexibility of the tendons.  Shock wave therapy, which involves focusing sound waves on the joint. If other treatments do not work, surgery may be done to clean out the calcium deposits and repair the tendon as needed. Most people do not need surgery. Follow these instructions at home: Managing pain, stiffness, and swelling      If directed, put heat on the affected area before you exercise or as often as told by your health care provider. Use the heat source that your health care provider recommends, such as a moist heat pack or a heating pad. ? Place a towel between your skin and the heat source. ? Leave the heat on for 20-30 minutes. ? Remove the heat if your skin turns bright red. This is especially important if you are unable to feel pain, heat, or cold. You may have a greater risk of getting burned.  Move the fingers or toes of the affected limb often, if this applies. This can help to prevent stiffness and lessen swelling.  If directed, elevate the affected area above the level of your heart while you are sitting or lying down.  If directed, put ice on the affected area: ? Put ice in a plastic bag. ? Place a towel between your skin and the bag. ? Leave the ice on for 20 minutes, 2-3 times a day. General instructions  Take over-the-counter and prescription medicines only as told by your health care provider.  Do   not drive or use heavy machinery while taking prescription pain medicine.  Follow recommendations from your health care provider for activity and exercise. Ask your health care provider what activities are safe for you.  Avoid using the affected area while you are experiencing symptoms of tendinitis.  Wear an elastic bandage or compression wrap only as told by your health care provider.  Keep all follow-up visits  as told by your health care provider. This is important. Contact a health care provider if:  You have pain or numbness that gets worse.  You develop new weakness.  You notice increased joint stiffness or a sensation of looseness in the joint.  You notice increasing redness, swelling, or warmth around the joint area. Get help right away if:  You have a fever for more than 2-3 days.  You have symptoms for more than 2-3 days.  You have a fever and your symptoms suddenly get worse. This information is not intended to replace advice given to you by your health care provider. Make sure you discuss any questions you have with your health care provider. Document Revised: 09/25/2018 Document Reviewed: 02/28/2016 Elsevier Patient Education  2020 [employer]  

## 2019-09-10 NOTE — Progress Notes (Signed)
Laurie Kelley  09/10/2019  Body mass index is 29.29 kg/m.   HISTORY SECTION :  Chief Complaint  Patient presents with  . Shoulder Pain    right x 3 years    49 year old female presents for evaluation of 3-year history of right shoulder pain.  She says she got 2 cortisone injections but the second 1 did not help.  Recently she has increased pain in the right shoulder associated with certain activities and positions.  Pain is located in the anterior shoulder joint as well as the periscapular region and upper trapezius muscle.  She denies any recent trauma.  She has not taken any medications recently.  She works at one of the prisons locally.   Review of Systems  HENT: Positive for nosebleeds.   Cardiovascular: Positive for chest pain and palpitations.  Gastrointestinal: Positive for abdominal pain and heartburn.  Musculoskeletal: Positive for joint pain, myalgias and neck pain.  Neurological: Positive for headaches.  All other systems reviewed and are negative.    has a past medical history of Chest pain in adult (07/31/2017), Esophageal reflux (08/18/2014), Gallstones, Hyperlipidemia with target LDL less than 100 (08/18/2015), and Internal hemorrhoids (08/18/2014).   Past Surgical History:  Procedure Laterality Date  . CHOLECYSTECTOMY      Body mass index is 29.29 kg/m.   No Known Allergies   Current Outpatient Medications:  .  atorvastatin (LIPITOR) 20 MG tablet, Take 1 tablet (20 mg total) by mouth daily., Disp: 90 tablet, Rfl: 3 .  magnesium oxide (MAG-OX) 400 MG tablet, Take 400 mg by mouth daily., Disp: , Rfl:  .  medroxyPROGESTERone Acetate 150 MG/ML SUSY, Inject 1 mL (150 mg total) into the muscle every 3 (three) months., Disp: 1 mL, Rfl: 3 .  Pumpkin Seed-Soy Germ (AZO BLADDER CONTROL/GO-LESS) CAPS, Take 1 capsule by mouth daily., Disp: , Rfl:  .  VITAMIN D, ERGOCALCIFEROL, PO, Take 1 capsule by mouth daily. , Disp: , Rfl:  .  meloxicam (MOBIC) 7.5 MG tablet, Take 1  tablet (7.5 mg total) by mouth daily., Disp: 30 tablet, Rfl: 5 .  tiZANidine (ZANAFLEX) 4 MG tablet, Take 1 tablet (4 mg total) by mouth daily., Disp: 30 tablet, Rfl: 1  Current Facility-Administered Medications:  .  medroxyPROGESTERone (DEPO-PROVERA) injection 150 mg, 150 mg, Intramuscular, Q90 days, Gottschalk, Ashly M, DO, 150 mg at 08/13/19 0956   PHYSICAL EXAM SECTION: 1) BP 138/88   Pulse 71   Ht 5\' 5"  (1.651 m)   Wt 176 lb (79.8 kg)   BMI 29.29 kg/m   Body mass index is 29.29 kg/m. General appearance: Well-developed well-nourished no gross deformities  2) Cardiovascular normal pulse and perfusion , normal color   3) Neurologically deep tendon reflexes are equal and normal, no sensation loss or deficits no pathologic reflexes  4) Psychological: Awake alert and oriented x3 mood and affect normal  5) Skin no lacerations or ulcerations no nodularity no palpable masses, no erythema or nodularity  6) Musculoskeletal:   Left shoulder has full range of motion skin is normal.  No tenderness.  No instability.  Muscle tone normal.  Impingement signs negative.  Right shoulder is tender in the periscapular region upper trapezius muscle as well as the anterior shoulder and lateral shoulder.  Skin is normal.  Range of motion shows decreased internal rotation decreased flexion and abduction.  Manual muscle testing reveals normal strength in all 3 rotator cuff units.  Stable in abduction external rotation.  Drop test negative.  MEDICAL DECISION MAKING  A.  Encounter Diagnoses  Name Primary?  . Chronic right shoulder pain   . Calcific tendonitis of right shoulder Yes  . Muscle spasm of right shoulder     B. DATA ANALYSED:  IMAGING: Independent interpretation of images: No we did internal films which show calcific tendinitis with a normal glenohumeral joint Orders: No  Outside records reviewed: No  C. MANAGEMENT  Recommend subacromial injection for the calcific  tendinitis Recommend muscle spasm medication Recommend anti-inflammatories  Meds ordered this encounter  Medications  . tiZANidine (ZANAFLEX) 4 MG tablet    Sig: Take 1 tablet (4 mg total) by mouth daily.    Dispense:  30 tablet    Refill:  1  . meloxicam (MOBIC) 7.5 MG tablet    Sig: Take 1 tablet (7.5 mg total) by mouth daily.    Dispense:  30 tablet    Refill:  5   On return if no improvement MRI will be ordered anticipating surgery may be necessary to remove the calcium deposits plus or minus cuff repair   Arther Abbott, MD  09/10/2019 12:19 PM

## 2019-10-02 ENCOUNTER — Other Ambulatory Visit: Payer: Self-pay | Admitting: Orthopedic Surgery

## 2019-10-03 ENCOUNTER — Encounter: Payer: Self-pay | Admitting: Orthopedic Surgery

## 2019-10-03 ENCOUNTER — Other Ambulatory Visit: Payer: Self-pay

## 2019-10-03 ENCOUNTER — Ambulatory Visit (INDEPENDENT_AMBULATORY_CARE_PROVIDER_SITE_OTHER): Payer: Commercial Managed Care - PPO | Admitting: Orthopedic Surgery

## 2019-10-03 VITALS — BP 127/82 | HR 74 | Ht 65.0 in | Wt 175.0 lb

## 2019-10-03 DIAGNOSIS — M25511 Pain in right shoulder: Secondary | ICD-10-CM

## 2019-10-03 DIAGNOSIS — M7531 Calcific tendinitis of right shoulder: Secondary | ICD-10-CM | POA: Diagnosis not present

## 2019-10-03 DIAGNOSIS — G8929 Other chronic pain: Secondary | ICD-10-CM | POA: Diagnosis not present
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
## 2019-10-03 NOTE — Patient Instructions (Signed)
MRI WILL BE SCHEDULED   Return with MRI and preop for surgery

## 2019-10-15 ENCOUNTER — Ambulatory Visit (HOSPITAL_COMMUNITY)
Admission: RE | Admit: 2019-10-15 | Discharge: 2019-10-15 | Disposition: A | Discharge status: Home / Self Care | Payer: Commercial Managed Care - PPO | Source: Ambulatory Visit | Attending: Orthopedic Surgery | Admitting: Orthopedic Surgery

## 2019-10-15 ENCOUNTER — Other Ambulatory Visit: Payer: Self-pay

## 2019-10-15 DIAGNOSIS — G8929 Other chronic pain: Secondary | ICD-10-CM | POA: Insufficient documentation

## 2019-10-15 DIAGNOSIS — M25511 Pain in right shoulder: Secondary | ICD-10-CM | POA: Insufficient documentation

## 2019-10-15 DIAGNOSIS — M7531 Calcific tendinitis of right shoulder: Secondary | ICD-10-CM | POA: Diagnosis not present

## 2019-10-17 ENCOUNTER — Telehealth: Payer: Self-pay | Admitting: Orthopedic Surgery

## 2019-10-17 NOTE — Telephone Encounter (Signed)
Results relayed  Patient has a Postel lesion labral tear arthritis  Recommend arthroscopy of the shoulder debridement completion of the partial tear and open rotator cuff repair  Patient will be out of work full duty 12 weeks partial duty 6 weeks

## 2019-10-20 ENCOUNTER — Telehealth: Payer: Self-pay | Admitting: Radiology

## 2019-10-20 NOTE — Telephone Encounter (Signed)
Patient returned your call. Please call her back again.

## 2019-10-20 NOTE — Telephone Encounter (Signed)
Left message for her to call me back so we can discuss scheduling right shoulder surgery

## 2019-10-20 NOTE — Telephone Encounter (Signed)
-----   Message from Carole Civil, MD sent at 10/17/2019 12:31 PM EDT ----- Scheduled for arthroscopy debridement open cuff repair

## 2019-10-21 ENCOUNTER — Other Ambulatory Visit: Payer: Self-pay | Admitting: Orthopedic Surgery

## 2019-10-21 NOTE — Telephone Encounter (Signed)
Scheduled, discussed with patient.

## 2019-10-21 NOTE — Telephone Encounter (Signed)
Patient called again to s/w you- asks that you call this number to reach her.  [phone]

## 2019-10-22 ENCOUNTER — Encounter: Payer: Self-pay | Admitting: Orthopedic Surgery

## 2019-10-23 ENCOUNTER — Ambulatory Visit: Payer: Commercial Managed Care - PPO | Admitting: Orthopedic Surgery

## 2019-10-27 ENCOUNTER — Other Ambulatory Visit: Payer: Self-pay | Admitting: Orthopedic Surgery

## 2019-10-30 NOTE — Patient Instructions (Signed)
Correna L Kuklinski  10/30/2019     @PREFPERIOPPHARMACY @   Your procedure is scheduled on  11/06/2019 .  Report to [facility] at  [phone]  A.M.  Call this number if you have problems the morning of surgery:  [phone]   Remember:  Do not eat or drink after midnight.                      Take these medicines the morning of surgery with A SIP OF WATER  Mobic(if needed), zanaflex(if needed).    Do not wear jewelry, make-up or nail polish.  Do not wear lotions, powders, or perfumes, or deodorant. Please brush your teeth.  Do not shave 48 hours prior to surgery.  Men may shave face and neck.  Do not bring valuables to the hospital.  [facility] is not responsible for any belongings or valuables.  Contacts, dentures or bridgework may not be worn into surgery.  Leave your suitcase in the car.  After surgery it may be brought to your room.  For patients admitted to the hospital, discharge time will be determined by your treatment team.  Patients discharged the day of surgery will not be allowed to drive home.   Name and phone number of your driver:   family Special instructions:  DO NOT smoke the morning of your surgery.  Please read over the following fact sheets that you were given. Anesthesia Post-op Instructions and Care and Recovery After Surgery       Shoulder Arthroscopy, Care After This sheet gives you information about how to care for yourself after your procedure. Your health care provider may also give you more specific instructions. If you have problems or questions, contact your health care provider. What can I expect after the procedure? After the procedure, it is common to have:  Pain that can be relieved by taking pain medicine.  Swelling.  A small amount of fluid from the incision.  Stiffness that improves over time. Follow these instructions at home: If you have a sling or immobilizer:  Wear the sling or immobilizer as told by your  health care provider. Remove it only as told by your health care provider. These devices protect your shoulder and help it heal by keeping it in place.  Loosen the sling or immobilizer if your fingers tingle, become numb, or turn cold and blue.  Keep the sling or immobilizer clean.  Ask if you may remove the sling or immobilizer for bathing. If you need to keep it on while bathing and it is not waterproof: ? Do not let it get wet. ? Cover it with a watertight covering when you take a bath or a shower. Incision care   Follow instructions from your health care provider about how to take care of your incisions. Make sure you: ? Wash your hands with soap and water before you change your bandage (dressing). If soap and water are not available, use hand sanitizer. ? Change your dressing as told by your health care provider. ? Leave stitches (sutures), staples, skin glue, or adhesive strips in place. These skin closures may need to stay in place for 2 weeks or longer. If adhesive strip edges start to loosen and curl up, you may trim the loose edges. Do not remove adhesive strips completely unless your health care provider tells you to do that.  Check your incision areas every day for  signs of infection. Check for: ? Redness ? More swelling or pain. ? Blood or more fluid. ? Warmth. ? Pus or a bad smell. Bathing  Do not take baths, swim, or use a hot tub until your health care provider approves. Ask your health care provider if you may take showers. You may only be allowed to take sponge baths. Activity  Ask your health care provider what activities are safe for you during recovery, and ask what activities you need to avoid.  Do not lift with your affected shoulder until your health care provider approves.  Avoid pulling and pushing with the arm on your affected side.  If physical therapy was prescribed, do exercises as directed. Doing exercises may help to improve shoulder movement and  flexibility (range of motion). Driving  Do not drive until your health care provider approves.  Do not drive or use heavy machinery while taking prescription pain medicine. Managing pain, stiffness, and swelling   If lying down flat causes shoulder discomfort, it may help to sleep in a sitting position for a few days after your procedure. Try sleeping in a reclining chair or propping yourself up with extra pillows in bed.  If directed, put ice on the affected area: ? Put ice in a plastic bag or use the icing device (cold therapy unit) that you were given. Follow instructions from your health care provider about how to use the icing device. ? Place a towel between your skin and the bag or between your skin and the icing device. ? Leave the ice on for 20 minutes, 2-3 times a day.  Move your fingers often to avoid stiffness and to lessen swelling. General instructions  Take over-the-counter and prescription medicines only as told by your health care provider.  If you are taking prescription pain medicine, take actions to prevent or treat constipation. Your health care provider may recommend that you: ? Drink enough fluid to keep your urine pale yellow. ? Eat foods that are high in fiber, such as fresh fruits and vegetables, whole grains, and beans. ? Limit foods that are high in fat and processed sugars, such as fried or sweet foods. ? Take an over-the-counter or prescription medicine for constipation.  Do not use any products that contain nicotine or tobacco, such as cigarettes and e-cigarettes. These can delay incision or bone healing. If you need help quitting, ask your health care provider.  Keep all follow-up visits as told by your health care provider. This is important. Contact a health care provider if you:  Have a fever.  Have severe pain.  Have redness around an incision.  Have more swelling or pain in an incision area.  Have blood or more fluid coming from an  incision.  Notice that an incision feels warm to the touch.  Notice pus or a bad smell coming from an incision.  Notice that an incision has opened up.  Develop a rash. Get help right away if you:  Have difficulty breathing.  Have chest pain.  Notice that your fingers tingle, are numb, or are cold and blue even after you loosen your sling or immobilizer.  Develop pain in your lower leg or at the back of your knee. Summary  If you have a sling or immobilizer, wear it as told by your health care provider. These devices protect your shoulder and help it heal by keeping it in place.  If lying down flat causes shoulder discomfort, it may help to sleep in a  sitting position for a few days after your procedure. Try sleeping in a reclining chair, or try propping yourself up with extra pillows in bed.  If physical therapy was prescribed, do exercises as directed. Doing exercises may help to improve shoulder movement and flexibility (range of motion).  Keep all follow-up visits as told by your health care provider. This is important. This information is not intended to replace advice given to you by your health care provider. Make sure you discuss any questions you have with your health care provider. Document Revised: 05/18/2017 Document Reviewed: 04/20/2017 Elsevier Patient Education  2020 [place].  Surgery for Rotator Cuff Tear, Care After This sheet gives you information about how to care for yourself after your procedure. Your health care provider may also give you more specific instructions. If you have problems or questions, contact your health care provider. What can I expect after the procedure? After the procedure, it is common to have:  Swelling.  Pain.  Stiffness.  Tenderness. Follow these instructions at home: If you have a sling or a shoulder immobilizer:  Wear it as told by your health care provider. Remove it only as told by your health care  provider.  Loosen it if your fingers tingle, become numb, or turn cold and blue.  Keep it clean. Bathing  Do not take baths, swim, or use a hot tub until your health care provider approves. Ask your health care provider if you may take showers. You may only be allowed to take sponge baths.  Keep your bandage (dressing) dry until your health care provider says it can be removed.  If your sling or shoulder immobilizer is not waterproof: ? Do not let it get wet. ? Remove it when you take a bath or shower as told by your health care provider. Once the sling or shoulder immobilizer is removed, try not to move your shoulder until your health care provider says that you can. Incision care   Follow instructions from your health care provider about how to take care of your incision. Make sure you: ? Wash your hands with soap and water before and after you change your dressing. If soap and water are not available, use hand sanitizer. ? Change your dressing as told by your health care provider. ? Leave stitches (sutures), skin glue, or adhesive strips in place. These skin closures may need to stay in place for 2 weeks or longer. If adhesive strip edges start to loosen and curl up, you may trim the loose edges. Do not remove adhesive strips completely unless your health care provider tells you to do that.  Check your incision area every day for signs of infection. Check for: ? More redness, swelling, or pain. ? More fluid or blood. ? Warmth. ? Pus or a bad smell. Managing pain, stiffness, and swelling   If directed, put ice on your shoulder area. ? Put ice in a plastic bag. ? Place a towel between your skin and the bag. ? Leave the ice on for 20 minutes, 2-3 times a day.  Move your fingers often to reduce stiffness and swelling.  Raise (elevate) your upper body on pillows when you lie down and when you sleep. ? Do not sleep on the front of your body (abdomen). ? Do not sleep on the side  that your surgery was performed on. Medicines  Take over-the-counter and prescription medicines only as told by your health care provider.  Ask your health care provider if the  medicine prescribed to you: ? Requires you to avoid driving or using heavy machinery. ? Can cause constipation. You may need to take actions to prevent or treat constipation, such as:  Drink enough fluid to keep your urine pale yellow.  Take over-the-counter or prescription medicines.  Eat foods that are high in fiber, such as beans, whole grains, and fresh fruits and vegetables.  Limit foods that are high in fat and processed sugars, such as fried or sweet foods. Driving  Do not drive for 24 hours if you were given a sedative during your procedure.  Do not drive while wearing a sling or a shoulder immobilizer. Ask your health care provider when it is safe to drive. Activity  Do not use your arm to support your body weight until your health care provider says that you can.  Do not lift or hold anything with your arm until your health care provider approves.  Return to your normal activities as told by your health care provider. Ask your health care provider what activities are safe for you.  Do exercises as told by your health care provider. General instructions  Do not use any products that contain nicotine or tobacco, such as cigarettes, e-cigarettes, and chewing tobacco. These can delay healing after surgery. If you need help quitting, ask your health care provider.  Keep all follow-up visits as told by your health care provider. This is important. Contact a health care provider if:  You have a fever.  You have more redness, swelling, or pain around your incision.  You have more fluid or blood coming from your incision.  Your incision feels warm to the touch.  You have pus or a bad smell coming from your incision.  You have pain that gets worse or does not get better with medicine. Get help  right away if:  You have severe pain.  You lose feeling in your arm or hand.  Your hand or fingers turn very pale or blue. Summary  If you have a sling, wear it as told by your health care provider. Remove it only as told by your health care provider.  Change your dressing as told by your health care provider. Check the incision area every day for signs of infection.  If directed, put ice on your shoulder area 2-3 times a day.  Do not use your arm to lift anything or to support your body weight until your health care provider says that you can. This information is not intended to replace advice given to you by your health care provider. Make sure you discuss any questions you have with your health care provider. Document Revised: 03/11/2018 Document Reviewed: 03/13/2018 Elsevier Patient Education  2020 [place].  Interscalene Nerve Block, Care After This sheet gives you information about how to care for yourself after your procedure. Your health care provider may also give you more specific instructions. If you have problems or questions, contact your health care provider. What can I expect after the procedure? After the procedure, it is common to have:  Soreness or tenderness in your neck.  Numbness in your shoulder, upper arm, and some fingers.  Weakness in your shoulder and arm muscles. The feeling and strength in your shoulder, arm, and fingers should return to normal within hours after your procedure. Follow these instructions at home: For at least 24 hours after the procedure:  Do not: ? Participate in activities in which you could fall or become injured. ? Drive. ? Use  heavy machinery. ? Drink alcohol. ? Take sleeping pills or medicines that cause drowsiness. ? Make important decisions or sign legal documents. ? Take care of children on your own.  Rest. Eating and drinking  If you vomit, drink water, juice, or soup when you can drink without vomiting.  Make  sure you have little or no nausea before eating solid foods.  Follow the diet that is recommended by your health care provider. If you have a sling:  Wear it as told by your health care provider. Remove it only as told by your health care provider.  Loosen the sling if your fingers tingle, become numb, or turn cold and blue.  Make sure that your entire arm, including your wrist, is supported. Do not allow your wrist to dangle over the end of the sling.  Do not let your sling get wet if it is not waterproof.  Keep the sling clean. Bathing  Do not take baths, swim, or use a hot tub until your health care provider approves.  If you have a nerve block catheter in place, keep the incision site and tubing dry. Injection site care   Wash your hands with soap and water before you change your bandage (dressing). If soap and water are not available, use hand sanitizer.  Change your dressing as told by your health care provider.  Keep your dressing dry.  Check your nerve block injection site every day for signs of infection. Check for: ? Redness, swelling, or pain. ? Fluid or blood. ? Warmth. Activity  Do not perform complex or risky activities while taking prescription pain medicine and until you have fully recovered.  Return to your normal activities as told by your health care provider and as you can tolerate them. Ask your health care provider what activities are safe for you.  Rest and take it easy. This will help you heal and recover more quickly and fully.  Be very cautious until you have regained strength and sensation. General instructions  Have a responsible adult stay with you until you are awake and alert.  Do not drive or use heavy machinery while taking prescription pain medicine and until you have fully recovered. Ask your health care provider when it is safe to drive.  Take over-the-counter and prescription medicines only as told by your health care provider.  If  you smoke, do not smoke without supervision.  Do not expose your arm or shoulder to very cold or very hot temperatures until you have full feeling back.  If you have a nerve block catheter in place: ? Try to keep the catheter from getting kinked or pinched. ? Avoid pulling or tugging on the catheter.  Keep all follow-up visits as told by your health care provider. This is important. Contact a health care provider if:  You have chills or fever.  You have redness, swelling, or pain around your injection site.  You have fluid or blood coming from the injection site.  The skin around the injection site is warm to the touch.  There is a bad smell coming from your dressing.  You have hoarseness or a drooping or dry eye that lasts more than a few days.  You have pain that is poorly controlled with the block or with pain medicine.  You have numbness, tingling, or weakness in your shoulder or arm that lasts for more than one week. Get help right away if:  You have severe pain.  You lose or do not  regain strength and sensation in your arm even after the nerve block medicine has stopped.  You have trouble breathing.  You have a nerve block catheter still in place and you begin to shiver.  You have a nerve block catheter still in place and you are getting more and more numb or weak. This information is not intended to replace advice given to you by your health care provider. Make sure you discuss any questions you have with your health care provider. Document Revised: 06/08/2017 Document Reviewed: 02/04/2016 Elsevier Patient Education  2020 [place] Anesthesia, Adult, Care After This sheet gives you information about how to care for yourself after your procedure. Your health care provider may also give you more specific instructions. If you have problems or questions, contact your health care provider. What can I expect after the procedure? After the procedure, the  following side effects are common:  Pain or discomfort at the IV site.  Nausea.  Vomiting.  Sore throat.  Trouble concentrating.  Feeling cold or chills.  Weak or tired.  Sleepiness and fatigue.  Soreness and body aches. These side effects can affect parts of the body that were not involved in surgery. Follow these instructions at home:  For at least 24 hours after the procedure:  Have a responsible adult stay with you. It is important to have someone help care for you until you are awake and alert.  Rest as needed.  Do not: ? Participate in activities in which you could fall or become injured. ? Drive. ? Use heavy machinery. ? Drink alcohol. ? Take sleeping pills or medicines that cause drowsiness. ? Make important decisions or sign legal documents. ? Take care of children on your own. Eating and drinking  Follow any instructions from your health care provider about eating or drinking restrictions.  When you feel hungry, start by eating small amounts of foods that are soft and easy to digest (bland), such as toast. Gradually return to your regular diet.  Drink enough fluid to keep your urine pale yellow.  If you vomit, rehydrate by drinking water, juice, or clear broth. General instructions  If you have sleep apnea, surgery and certain medicines can increase your risk for breathing problems. Follow instructions from your health care provider about wearing your sleep device: ? Anytime you are sleeping, including during daytime naps. ? While taking prescription pain medicines, sleeping medicines, or medicines that make you drowsy.  Return to your normal activities as told by your health care provider. Ask your health care provider what activities are safe for you.  Take over-the-counter and prescription medicines only as told by your health care provider.  If you smoke, do not smoke without supervision.  Keep all follow-up visits as told by your health care  provider. This is important. Contact a health care provider if:  You have nausea or vomiting that does not get better with medicine.  You cannot eat or drink without vomiting.  You have pain that does not get better with medicine.  You are unable to pass urine.  You develop a skin rash.  You have a fever.  You have redness around your IV site that gets worse. Get help right away if:  You have difficulty breathing.  You have chest pain.  You have blood in your urine or stool, or you vomit blood. Summary  After the procedure, it is common to have a sore throat or nausea. It is also common to feel tired.  Have a responsible adult stay with you for the first 24 hours after general anesthesia. It is important to have someone help care for you until you are awake and alert.  When you feel hungry, start by eating small amounts of foods that are soft and easy to digest (bland), such as toast. Gradually return to your regular diet.  Drink enough fluid to keep your urine pale yellow.  Return to your normal activities as told by your health care provider. Ask your health care provider what activities are safe for you. This information is not intended to replace advice given to you by your health care provider. Make sure you discuss any questions you have with your health care provider. Document Revised: 06/08/2017 Document Reviewed: 01/19/2017 Elsevier Patient Education  [place]. How to Use Chlorhexidine for Bathing Chlorhexidine gluconate (CHG) is a germ-killing (antiseptic) solution that is used to clean the skin. It can get rid of the bacteria that normally live on the skin and can keep them away for about 24 hours. To clean your skin with CHG, you may be given:  A CHG solution to use in the shower or as part of a sponge bath.  A prepackaged cloth that contains CHG. Cleaning your skin with CHG may help lower the risk for infection:  While you are staying in the  intensive care unit of the hospital.  If you have a vascular access, such as a central line, to provide short-term or long-term access to your veins.  If you have a catheter to drain urine from your bladder.  If you are on a ventilator. A ventilator is a machine that helps you breathe by moving air in and out of your lungs.  After surgery. What are the risks? Risks of using CHG include:  A skin reaction.  Hearing loss, if CHG gets in your ears.  Eye injury, if CHG gets in your eyes and is not rinsed out.  The CHG product catching fire. Make sure that you avoid smoking and flames after applying CHG to your skin. Do not use CHG:  If you have a chlorhexidine allergy or have previously reacted to chlorhexidine.  On babies younger than 41 months of age. How to use CHG solution  Use CHG only as told by your health care provider, and follow the instructions on the label.  Use the full amount of CHG as directed. Usually, this is one bottle. During a shower Follow these steps when using CHG solution during a shower (unless your health care provider gives you different instructions): 1. Start the shower. 2. Use your normal soap and shampoo to wash your face and hair. 3. Turn off the shower or move out of the shower stream. 4. Pour the CHG onto a clean washcloth. Do not use any type of brush or rough-edged sponge. 5. Starting at your neck, lather your body down to your toes. Make sure you follow these instructions: ? If you will be having surgery, pay special attention to the part of your body where you will be having surgery. Scrub this area for at least 1 minute. ? Do not use CHG on your head or face. If the solution gets into your ears or eyes, rinse them well with water. ? Avoid your genital area. ? Avoid any areas of skin that have broken skin, cuts, or scrapes. ? Scrub your back and under your arms. Make sure to wash skin folds. 6. Let the lather sit on your skin for  1-2 minutes  or as long as told by your health care provider. 7. Thoroughly rinse your entire body in the shower. Make sure that all body creases and crevices are rinsed well. 8. Dry off with a clean towel. Do not put any substances on your body afterward--such as powder, lotion, or perfume--unless you are told to do so by your health care provider. Only use lotions that are recommended by the manufacturer. 9. Put on clean clothes or pajamas. 10. If it is the night before your surgery, sleep in clean sheets.  During a sponge bath Follow these steps when using CHG solution during a sponge bath (unless your health care provider gives you different instructions): 1. Use your normal soap and shampoo to wash your face and hair. 2. Pour the CHG onto a clean washcloth. 3. Starting at your neck, lather your body down to your toes. Make sure you follow these instructions: ? If you will be having surgery, pay special attention to the part of your body where you will be having surgery. Scrub this area for at least 1 minute. ? Do not use CHG on your head or face. If the solution gets into your ears or eyes, rinse them well with water. ? Avoid your genital area. ? Avoid any areas of skin that have broken skin, cuts, or scrapes. ? Scrub your back and under your arms. Make sure to wash skin folds. 4. Let the lather sit on your skin for 1-2 minutes or as long as told by your health care provider. 5. Using a different clean, wet washcloth, thoroughly rinse your entire body. Make sure that all body creases and crevices are rinsed well. 6. Dry off with a clean towel. Do not put any substances on your body afterward--such as powder, lotion, or perfume--unless you are told to do so by your health care provider. Only use lotions that are recommended by the manufacturer. 7. Put on clean clothes or pajamas. 8. If it is the night before your surgery, sleep in clean sheets. How to use CHG prepackaged cloths  Only use CHG cloths as  told by your health care provider, and follow the instructions on the label.  Use the CHG cloth on clean, dry skin.  Do not use the CHG cloth on your head or face unless your health care provider tells you to.  When washing with the CHG cloth: ? Avoid your genital area. ? Avoid any areas of skin that have broken skin, cuts, or scrapes. Before surgery Follow these steps when using a CHG cloth to clean before surgery (unless your health care provider gives you different instructions): 1. Using the CHG cloth, vigorously scrub the part of your body where you will be having surgery. Scrub using a back-and-forth motion for 3 minutes. The area on your body should be completely wet with CHG when you are done scrubbing. 2. Do not rinse. Discard the cloth and let the area air-dry. Do not put any substances on the area afterward, such as powder, lotion, or perfume. 3. Put on clean clothes or pajamas. 4. If it is the night before your surgery, sleep in clean sheets.  For general bathing Follow these steps when using CHG cloths for general bathing (unless your health care provider gives you different instructions). 1. Use a separate CHG cloth for each area of your body. Make sure you wash between any folds of skin and between your fingers and toes. Wash your body in the following order, switching to  a new cloth after each step: ? The front of your neck, shoulders, and chest. ? Both of your arms, under your arms, and your hands. ? Your stomach and groin area, avoiding the genitals. ? Your right leg and foot. ? Your left leg and foot. ? The back of your neck, your back, and your buttocks. 2. Do not rinse. Discard the cloth and let the area air-dry. Do not put any substances on your body afterward--such as powder, lotion, or perfume--unless you are told to do so by your health care provider. Only use lotions that are recommended by the manufacturer. 3. Put on clean clothes or pajamas. Contact a health  care provider if:  Your skin gets irritated after scrubbing.  You have questions about using your solution or cloth. Get help right away if:  Your eyes become very red or swollen.  Your eyes itch badly.  Your skin itches badly and is red or swollen.  Your hearing changes.  You have trouble seeing.  You have swelling or tingling in your mouth or throat.  You have trouble breathing.  You swallow any chlorhexidine. Summary  Chlorhexidine gluconate (CHG) is a germ-killing (antiseptic) solution that is used to clean the skin. Cleaning your skin with CHG may help to lower your risk for infection.  You may be given CHG to use for bathing. It may be in a bottle or in a prepackaged cloth to use on your skin. Carefully follow your health care provider's instructions and the instructions on the product label.  Do not use CHG if you have a chlorhexidine allergy.  Contact your health care provider if your skin gets irritated after scrubbing. This information is not intended to replace advice given to you by your health care provider. Make sure you discuss any questions you have with your health care provider. Document Revised: 08/22/2018 Document Reviewed: 05/03/2017 Elsevier Patient Education  [place].

## 2019-10-31 ENCOUNTER — Ambulatory Visit (INDEPENDENT_AMBULATORY_CARE_PROVIDER_SITE_OTHER): Payer: Commercial Managed Care - PPO | Admitting: *Deleted

## 2019-10-31 ENCOUNTER — Other Ambulatory Visit: Payer: Self-pay

## 2019-10-31 DIAGNOSIS — Z3042 Encounter for surveillance of injectable contraceptive: Secondary | ICD-10-CM

## 2019-10-31 MED ORDER — MEDROXYPROGESTERONE ACETATE 150 MG/ML IM SUSP
150.0000 mg | Freq: Once | INTRAMUSCULAR | Status: AC
  Administered 2019-10-31: 150 mg via INTRAMUSCULAR

## 2019-10-31 NOTE — Progress Notes (Signed)
Patient in today for Depo Provera injection. 150 mg given IM in left upper outer quadrant. Patient tolerated well.

## 2019-11-03 ENCOUNTER — Telehealth: Payer: Self-pay | Admitting: Radiology

## 2019-11-03 NOTE — Telephone Encounter (Signed)
I called [facility] to check on auth for upcoming surgery, they are having phone trouble, line disconnected x 2 after holding for several minutes. I have resent the fax that I sent last week for approval of surgery.

## 2019-11-04 ENCOUNTER — Other Ambulatory Visit (HOSPITAL_COMMUNITY)
Admission: RE | Admit: 2019-11-04 | Discharge: 2019-11-04 | Disposition: A | Discharge status: Home / Self Care | Payer: Commercial Managed Care - PPO | Source: Ambulatory Visit | Attending: Orthopedic Surgery | Admitting: Orthopedic Surgery

## 2019-11-04 ENCOUNTER — Telehealth: Payer: Self-pay | Admitting: Radiology

## 2019-11-04 ENCOUNTER — Other Ambulatory Visit: Payer: Self-pay

## 2019-11-04 ENCOUNTER — Encounter (HOSPITAL_COMMUNITY): Payer: Self-pay

## 2019-11-04 ENCOUNTER — Other Ambulatory Visit: Payer: Self-pay | Admitting: Orthopedic Surgery

## 2019-11-04 ENCOUNTER — Encounter (HOSPITAL_COMMUNITY)
Admission: RE | Admit: 2019-11-04 | Discharge: 2019-11-04 | Disposition: A | Discharge status: Home / Self Care | Payer: Commercial Managed Care - PPO | Source: Ambulatory Visit | Attending: Orthopedic Surgery | Admitting: Orthopedic Surgery

## 2019-11-04 DIAGNOSIS — Z01812 Encounter for preprocedural laboratory examination: Secondary | ICD-10-CM | POA: Diagnosis present

## 2019-11-04 DIAGNOSIS — M7531 Calcific tendinitis of right shoulder: Secondary | ICD-10-CM

## 2019-11-04 DIAGNOSIS — Z20822 Contact with and (suspected) exposure to covid-19: Secondary | ICD-10-CM | POA: Diagnosis not present

## 2019-11-04 HISTORY — DX: Other specified postprocedural states: Z98.890

## 2019-11-04 HISTORY — DX: Nausea with vomiting, unspecified: R11.2

## 2019-11-04 HISTORY — DX: Other specified postprocedural states: R11.2

## 2019-11-04 LAB — CBC WITH DIFFERENTIAL/PLATELET
Abs Immature Granulocytes: 0.01 10*3/uL (ref 0.00–0.07)
Basophils Absolute: 0 10*3/uL (ref 0.0–0.1)
Basophils Relative: 0 %
Eosinophils Absolute: 0.1 10*3/uL (ref 0.0–0.5)
Eosinophils Relative: 1 %
HCT: 41.5 % (ref 36.0–46.0)
Hemoglobin: 13.6 g/dL (ref 12.0–15.0)
Immature Granulocytes: 0 %
Lymphocytes Relative: 36 %
Lymphs Abs: 1.9 10*3/uL (ref 0.7–4.0)
MCH: 29.2 pg (ref 26.0–34.0)
MCHC: 32.8 g/dL (ref 30.0–36.0)
MCV: 89.2 fL (ref 80.0–100.0)
Monocytes Absolute: 0.3 10*3/uL (ref 0.1–1.0)
Monocytes Relative: 5 %
Neutro Abs: 3 10*3/uL (ref 1.7–7.7)
Neutrophils Relative %: 58 %
Platelets: 288 10*3/uL (ref 150–400)
RBC: 4.65 MIL/uL (ref 3.87–5.11)
RDW: 13 % (ref 11.5–15.5)
WBC: 5.2 10*3/uL (ref 4.0–10.5)
nRBC: 0 % (ref 0.0–0.2)

## 2019-11-04 LAB — SARS CORONAVIRUS 2 (TAT 6-24 HRS): SARS Coronavirus 2: NEGATIVE

## 2019-11-04 LAB — BASIC METABOLIC PANEL
Anion gap: 11 (ref 5–15)
BUN: 12 mg/dL (ref 6–20)
CO2: 20 mmol/L — ABNORMAL LOW (ref 22–32)
Calcium: 10.3 mg/dL (ref 8.9–10.3)
Chloride: 108 mmol/L (ref 98–111)
Creatinine, Ser: 0.74 mg/dL (ref 0.44–1.00)
GFR calc Af Amer: >60 mL/min (ref >60)
GFR calc non Af Amer: >60 mL/min (ref >60)
Glucose, Bld: 139 mg/dL — ABNORMAL HIGH (ref 70–99)
Potassium: 3.7 mmol/L (ref 3.5–5.1)
Sodium: 139 mmol/L (ref 135–145)

## 2019-11-04 LAB — HCG, SERUM, QUALITATIVE: Preg, Serum: NEGATIVE

## 2019-11-04 NOTE — Telephone Encounter (Signed)
Received the approval for the shoulder surgery [id number]  [phone] is good 11/06/19 through 12/07/19

## 2019-11-05 NOTE — H&P (Signed)
Chief Complaint  Patient presents with  . Shoulder Pain      right x 3 years     49 year old female presented for evaluation of 3-year history of right shoulder pain.  She says she got 2 cortisone injections but the second 1 did not help.  Recently she has increased pain in the right shoulder associated with certain activities and positions.  Pain is located in the anterior shoulder joint as well as the periscapular region and upper trapezius muscle.  She denies any recent trauma.  She has not taken any medications recently.   She works at one of the prisons locally.    We treated her with a rehab program and injection but still no relief.  She had an MRI which showed she had a partial cuff tear and the pain was worsening so she opted for surgical versus continued nonoperative management   Review of Systems  HENT: Positive for nosebleeds.   Cardiovascular: Positive for chest pain and palpitations.  Gastrointestinal: Positive for abdominal pain and heartburn.  Musculoskeletal: Positive for joint pain, myalgias and neck pain.  Neurological: Positive for headaches.  All other systems reviewed and are negative.      has a past medical history of Chest pain in adult (07/31/2017), Esophageal reflux (08/18/2014), Gallstones, Hyperlipidemia with target LDL less than 100 (08/18/2015), and Internal hemorrhoids (08/18/2014).         Past Surgical History:  Procedure Laterality Date  . CHOLECYSTECTOMY          Body mass index is 29.29 kg/m.     No Known Allergies     Current Outpatient Medications:  .  atorvastatin (LIPITOR) 20 MG tablet, Take 1 tablet (20 mg total) by mouth daily., Disp: 90 tablet, Rfl: 3 .  magnesium oxide (MAG-OX) 400 MG tablet, Take 400 mg by mouth daily., Disp: , Rfl:  .  medroxyPROGESTERone Acetate 150 MG/ML SUSY, Inject 1 mL (150 mg total) into the muscle every 3 (three) months., Disp: 1 mL, Rfl: 3 .  Pumpkin Seed-Soy Germ (AZO BLADDER CONTROL/GO-LESS) CAPS, Take 1  capsule by mouth daily., Disp: , Rfl:  .  VITAMIN D, ERGOCALCIFEROL, PO, Take 1 capsule by mouth daily. , Disp: , Rfl:  .  meloxicam (MOBIC) 7.5 MG tablet, Take 1 tablet (7.5 mg total) by mouth daily., Disp: 30 tablet, Rfl: 5 .  tiZANidine (ZANAFLEX) 4 MG tablet, Take 1 tablet (4 mg total) by mouth daily., Disp: 30 tablet, Rfl: 1      PHYSICAL EXAM SECTION: 1) BP 138/88   Pulse 71   Ht 5\' 5"  (1.651 m)   Wt 176 lb (79.8 kg)   BMI 29.29 kg/m   Body mass index is 29.29 kg/m. General appearance: Well-developed well-nourished no gross deformities  2) Cardiovascular normal pulse and perfusion , normal color   3) Neurologically deep tendon reflexes are equal and normal, no sensation loss or deficits no pathologic reflexes   4) Psychological: Awake alert and oriented x3 mood and affect normal   5) Skin no lacerations or ulcerations no nodularity no palpable masses, no erythema or nodularity   6) Musculoskeletal:    Left shoulder has full range of motion skin is normal.  No tenderness.  No instability.  Muscle tone normal.  Impingement signs negative.   Right shoulder is tender in the periscapular region upper trapezius muscle as well as the anterior shoulder and lateral shoulder.  Skin is normal.  Range of motion shows decreased internal rotation decreased flexion  and abduction.  Manual muscle testing reveals normal strength in all 3 rotator cuff units.  Stable in abduction external rotation.  Drop test negative.     MEDICAL DECISION MAKING   A.      Encounter Diagnoses  Name Primary?  . Chronic right shoulder pain    . Calcific tendonitis of right shoulder Yes  . Muscle spasm of right shoulder       MRI: FINDINGS: Rotator cuff: Significant supraspinatus tendinopathy/tendinosis. The tendon is quite thickened and demonstrates diffuse increased T2 signal intensity. Posteriorly near its junction with the infraspinatus tendon there is a partial thickness articular surface tear with  10 mm of laminar retraction of the articular fibers. Anteriorly there is also an articular surface tear with 12 mm of laminar retraction of the articular fibers. There is also a focus of calcific tendinitis involving the supraspinatus tendon anteriorly. The subscapularis tendon is intact.   Muscles:  Normal   Biceps long head:  Intact   Acromioclavicular Joint: Mild degenerative changes. Type 1-2 acromion. Mild lateral downsloping but no definite undersurface spurring.   Glenohumeral Joint: Mild degenerative changes. Small joint effusion.   Labrum: The anterior labrum is degenerated and likely torn. There is also a small tear involving the posterior labrum. The superior labrum demonstrates degenerative fraying changes but no definite tear.   Bones:  No acute bony findings.   Other: No subacromial/subdeltoid fluid collections to suggest bursitis.   IMPRESSION: 1. Significant supraspinatus tendinopathy/tendinosis. There are partial-thickness articular surface tears both anteriorly and posteriorly. No full-thickness retracted tear but probably at risk for such. 2. Degenerated and likely torn anterior labrum. There is also a small tear involving the posterior labrum. 3. Mild AC joint degenerative changes and mild lateral downsloping of the acromion may contribute to bony impingement. 4. Mild glenohumeral joint degenerative changes.     Electronically Signed   By: Marijo Sanes M.D.   On: 10/16/2019 12:44   My plan is to do an right arthroscopy of the shoulder debride the calcific tendinitis and evaluate the partial-thickness tears for possible repair

## 2019-11-06 ENCOUNTER — Ambulatory Visit (HOSPITAL_COMMUNITY): Payer: Commercial Managed Care - PPO | Admitting: Certified Registered"

## 2019-11-06 ENCOUNTER — Ambulatory Visit (HOSPITAL_COMMUNITY)
Admission: RE | Admit: 2019-11-06 | Discharge: 2019-11-06 | Disposition: A | Discharge status: Home / Self Care | Payer: Commercial Managed Care - PPO | Attending: Orthopedic Surgery | Admitting: Orthopedic Surgery

## 2019-11-06 ENCOUNTER — Encounter (HOSPITAL_COMMUNITY): Payer: Self-pay | Admitting: Orthopedic Surgery

## 2019-11-06 ENCOUNTER — Encounter: Payer: Self-pay | Admitting: Orthopedic Surgery

## 2019-11-06 ENCOUNTER — Encounter (HOSPITAL_COMMUNITY)
Admission: RE | Disposition: A | Discharge status: Home / Self Care | Payer: Self-pay | Source: Home / Self Care | Attending: Orthopedic Surgery

## 2019-11-06 DIAGNOSIS — M25511 Pain in right shoulder: Secondary | ICD-10-CM | POA: Diagnosis not present

## 2019-11-06 DIAGNOSIS — R519 Headache, unspecified: Secondary | ICD-10-CM | POA: Insufficient documentation

## 2019-11-06 DIAGNOSIS — M7551 Bursitis of right shoulder: Secondary | ICD-10-CM | POA: Diagnosis not present

## 2019-11-06 DIAGNOSIS — Z79899 Other long term (current) drug therapy: Secondary | ICD-10-CM | POA: Insufficient documentation

## 2019-11-06 DIAGNOSIS — Z791 Long term (current) use of non-steroidal anti-inflammatories (NSAID): Secondary | ICD-10-CM | POA: Insufficient documentation

## 2019-11-06 DIAGNOSIS — M7531 Calcific tendinitis of right shoulder: Secondary | ICD-10-CM | POA: Diagnosis not present

## 2019-11-06 DIAGNOSIS — R002 Palpitations: Secondary | ICD-10-CM | POA: Diagnosis not present

## 2019-11-06 DIAGNOSIS — G8929 Other chronic pain: Secondary | ICD-10-CM | POA: Diagnosis not present

## 2019-11-06 DIAGNOSIS — K219 Gastro-esophageal reflux disease without esophagitis: Secondary | ICD-10-CM | POA: Diagnosis not present

## 2019-11-06 DIAGNOSIS — Z9049 Acquired absence of other specified parts of digestive tract: Secondary | ICD-10-CM | POA: Diagnosis not present

## 2019-11-06 DIAGNOSIS — M75111 Incomplete rotator cuff tear or rupture of right shoulder, not specified as traumatic: Secondary | ICD-10-CM | POA: Diagnosis not present

## 2019-11-06 DIAGNOSIS — M75121 Complete rotator cuff tear or rupture of right shoulder, not specified as traumatic: Secondary | ICD-10-CM | POA: Insufficient documentation

## 2019-11-06 DIAGNOSIS — E785 Hyperlipidemia, unspecified: Secondary | ICD-10-CM | POA: Diagnosis not present

## 2019-11-06 HISTORY — PX: SHOULDER ARTHROSCOPY: SHX128

## 2019-11-06 SURGERY — ARTHROSCOPY, SHOULDER
Anesthesia: General | Site: Shoulder | Laterality: Right

## 2019-11-06 MED ORDER — CEFAZOLIN SODIUM-DEXTROSE 2-4 GM/100ML-% IV SOLN
INTRAVENOUS | Status: AC
  Filled 2019-11-06: qty 100

## 2019-11-06 MED ORDER — PHENYLEPHRINE HCL-NACL 10-0.9 MG/250ML-% IV SOLN
INTRAVENOUS | Status: DC | PRN
  Administered 2019-11-06: 25 ug/min via INTRAVENOUS

## 2019-11-06 MED ORDER — SCOPOLAMINE 1 MG/3DAYS TD PT72
MEDICATED_PATCH | TRANSDERMAL | Status: AC
  Filled 2019-11-06: qty 1

## 2019-11-06 MED ORDER — BUPIVACAINE-EPINEPHRINE (PF) 0.5% -1:200000 IJ SOLN
INTRAMUSCULAR | Status: AC
  Filled 2019-11-06: qty 60

## 2019-11-06 MED ORDER — LIDOCAINE HCL (PF) 1 % IJ SOLN
INTRAMUSCULAR | Status: AC
  Filled 2019-11-06: qty 30

## 2019-11-06 MED ORDER — PREGABALIN 50 MG PO CAPS
50.0000 mg | ORAL_CAPSULE | Freq: Once | ORAL | Status: AC
  Administered 2019-11-06: 50 mg via ORAL
  Filled 2019-11-06: qty 1

## 2019-11-06 MED ORDER — FENTANYL CITRATE (PF) 100 MCG/2ML IJ SOLN
INTRAMUSCULAR | Status: DC | PRN
  Administered 2019-11-06 (×2): 50 ug via INTRAVENOUS

## 2019-11-06 MED ORDER — ROCURONIUM BROMIDE 10 MG/ML (PF) SYRINGE
PREFILLED_SYRINGE | INTRAVENOUS | Status: AC
  Filled 2019-11-06: qty 10

## 2019-11-06 MED ORDER — ROPIVACAINE HCL 5 MG/ML IJ SOLN
INTRAMUSCULAR | Status: AC
  Filled 2019-11-06: qty 30

## 2019-11-06 MED ORDER — ONDANSETRON HCL 4 MG/2ML IJ SOLN
INTRAMUSCULAR | Status: AC
  Filled 2019-11-06: qty 2

## 2019-11-06 MED ORDER — ONDANSETRON HCL 4 MG/2ML IJ SOLN
INTRAMUSCULAR | Status: DC | PRN
  Administered 2019-11-06: 4 mg via INTRAVENOUS

## 2019-11-06 MED ORDER — SUGAMMADEX SODIUM 200 MG/2ML IV SOLN
INTRAVENOUS | Status: DC | PRN
  Administered 2019-11-06: 200 mg via INTRAVENOUS

## 2019-11-06 MED ORDER — BUPIVACAINE-EPINEPHRINE 0.5% -1:200000 IJ SOLN
INTRAMUSCULAR | Status: DC | PRN
  Administered 2019-11-06: 15 mL

## 2019-11-06 MED ORDER — SCOPOLAMINE 1 MG/3DAYS TD PT72
1.0000 | MEDICATED_PATCH | TRANSDERMAL | Status: DC
  Administered 2019-11-06: 1.5 mg via TRANSDERMAL

## 2019-11-06 MED ORDER — CELECOXIB 400 MG PO CAPS
400.0000 mg | ORAL_CAPSULE | Freq: Once | ORAL | Status: AC
  Administered 2019-11-06: 400 mg via ORAL
  Filled 2019-11-06: qty 1

## 2019-11-06 MED ORDER — DEXAMETHASONE SODIUM PHOSPHATE 10 MG/ML IJ SOLN
INTRAMUSCULAR | Status: AC
  Filled 2019-11-06: qty 1

## 2019-11-06 MED ORDER — METHOCARBAMOL 1000 MG/10ML IJ SOLN
500.0000 mg | Freq: Once | INTRAVENOUS | Status: AC
  Administered 2019-11-06: 500 mg via INTRAVENOUS
  Filled 2019-11-06: qty 5

## 2019-11-06 MED ORDER — ONDANSETRON HCL 4 MG/2ML IJ SOLN
4.0000 mg | Freq: Once | INTRAMUSCULAR | Status: AC
  Administered 2019-11-06: 4 mg via INTRAVENOUS
  Filled 2019-11-06: qty 2

## 2019-11-06 MED ORDER — LACTATED RINGERS IV SOLN: INTRAVENOUS | Status: DC

## 2019-11-06 MED ORDER — FENTANYL CITRATE (PF) 100 MCG/2ML IJ SOLN
INTRAMUSCULAR | Status: AC
  Filled 2019-11-06: qty 2

## 2019-11-06 MED ORDER — ROCURONIUM BROMIDE 10 MG/ML (PF) SYRINGE
PREFILLED_SYRINGE | INTRAVENOUS | Status: DC | PRN
  Administered 2019-11-06: 50 mg via INTRAVENOUS

## 2019-11-06 MED ORDER — ORAL CARE MOUTH RINSE: 15.0000 mL | Freq: Once | OROMUCOSAL | Status: DC

## 2019-11-06 MED ORDER — CEFAZOLIN SODIUM-DEXTROSE 2-4 GM/100ML-% IV SOLN
2.0000 g | INTRAVENOUS | Status: AC
  Administered 2019-11-06: 2 g via INTRAVENOUS

## 2019-11-06 MED ORDER — LIDOCAINE 2% (20 MG/ML) 5 ML SYRINGE
INTRAMUSCULAR | Status: AC
  Filled 2019-11-06: qty 5

## 2019-11-06 MED ORDER — DEXAMETHASONE SODIUM PHOSPHATE 4 MG/ML IJ SOLN
INTRAMUSCULAR | Status: AC
  Filled 2019-11-06: qty 2

## 2019-11-06 MED ORDER — SODIUM CHLORIDE 0.9 % IV SOLN
INTRAVENOUS | Status: DC | PRN
  Administered 2019-11-06: 120 ug via INTRAVENOUS

## 2019-11-06 MED ORDER — PROMETHAZINE HCL 12.5 MG PO TABS
12.5000 mg | ORAL_TABLET | Freq: Four times a day (QID) | ORAL | 0 refills | Status: DC | PRN
Start: 2019-11-06 — End: 2020-05-19

## 2019-11-06 MED ORDER — SODIUM CHLORIDE 0.9 % IR SOLN
Status: DC | PRN
  Administered 2019-11-06 (×4): 3000 mL

## 2019-11-06 MED ORDER — SODIUM CHLORIDE 0.9 % IR SOLN
Status: DC | PRN
  Administered 2019-11-06: 1000 mL

## 2019-11-06 MED ORDER — PROMETHAZINE HCL 25 MG/ML IJ SOLN
INTRAMUSCULAR | Status: AC
  Filled 2019-11-06: qty 1

## 2019-11-06 MED ORDER — MIDAZOLAM HCL 5 MG/5ML IJ SOLN
INTRAMUSCULAR | Status: DC | PRN
  Administered 2019-11-06: 2 mg via INTRAVENOUS

## 2019-11-06 MED ORDER — DEXAMETHASONE SODIUM PHOSPHATE 10 MG/ML IJ SOLN
INTRAMUSCULAR | Status: DC | PRN
  Administered 2019-11-06: 10 mg via INTRAVENOUS

## 2019-11-06 MED ORDER — LIDOCAINE 2% (20 MG/ML) 5 ML SYRINGE
INTRAMUSCULAR | Status: DC | PRN
  Administered 2019-11-06: 40 mg via INTRAVENOUS

## 2019-11-06 MED ORDER — HYDROCODONE-ACETAMINOPHEN 10-325 MG PO TABS: 1.0000 | ORAL_TABLET | ORAL | 0 refills | Status: DC | PRN

## 2019-11-06 MED ORDER — OXYCODONE HCL 5 MG PO TABS
5.0000 mg | ORAL_TABLET | Freq: Once | ORAL | Status: AC
  Administered 2019-11-06: 5 mg via ORAL
  Filled 2019-11-06: qty 1

## 2019-11-06 MED ORDER — HYDROMORPHONE HCL 1 MG/ML IJ SOLN: 0.2500 mg | INTRAMUSCULAR | Status: DC | PRN

## 2019-11-06 MED ORDER — CHLORHEXIDINE GLUCONATE 0.12 % MT SOLN: 15.0000 mL | Freq: Once | OROMUCOSAL | Status: DC

## 2019-11-06 MED ORDER — ONDANSETRON HCL 4 MG/2ML IJ SOLN: 4.0000 mg | Freq: Once | INTRAMUSCULAR | Status: DC | PRN

## 2019-11-06 MED ORDER — PROPOFOL 10 MG/ML IV BOLUS
INTRAVENOUS | Status: AC
  Filled 2019-11-06: qty 20

## 2019-11-06 MED ORDER — TIZANIDINE HCL 4 MG PO TABS
4.0000 mg | ORAL_TABLET | Freq: Three times a day (TID) | ORAL | 1 refills | Status: DC | PRN
Start: 2019-11-06 — End: 2019-11-23

## 2019-11-06 MED ORDER — MIDAZOLAM HCL 2 MG/2ML IJ SOLN
INTRAMUSCULAR | Status: AC
  Filled 2019-11-06: qty 2

## 2019-11-06 MED ORDER — PROPOFOL 10 MG/ML IV BOLUS
INTRAVENOUS | Status: DC | PRN
  Administered 2019-11-06: 150 mg via INTRAVENOUS
  Administered 2019-11-06: 50 mg via INTRAVENOUS

## 2019-11-06 MED ORDER — PROMETHAZINE HCL 25 MG/ML IJ SOLN
12.5000 mg | Freq: Once | INTRAMUSCULAR | Status: AC
  Administered 2019-11-06: 12.5 mg via INTRAVENOUS

## 2019-11-06 MED ORDER — PHENYLEPHRINE HCL (PRESSORS) 10 MG/ML IV SOLN
INTRAVENOUS | Status: AC
  Filled 2019-11-06: qty 1

## 2019-11-06 MED ORDER — EPINEPHRINE PF 1 MG/ML IJ SOLN
INTRAMUSCULAR | Status: AC
  Filled 2019-11-06: qty 10

## 2019-11-06 SURGICAL SUPPLY — 66 items
ANCH SUT SWLK 19.1X4.75 (Anchor) ×1 IMPLANT
ANCHOR SUT BIO SW 4.75X19.1 (Anchor) ×2 IMPLANT
APL PRP STRL LF DISP 70% ISPRP (MISCELLANEOUS) ×1
APL SKNCLS STERI-STRIP NONHPOA (GAUZE/BANDAGES/DRESSINGS) ×1
BENZOIN TINCTURE PRP APPL 2/3 (GAUZE/BANDAGES/DRESSINGS) ×2 IMPLANT
BLADE AVERAGE 25MMX9MM (BLADE) ×1
BLADE AVERAGE 25X9 (BLADE) ×1 IMPLANT
BLADE EXCALIBUR 4.0MM X 13CM (MISCELLANEOUS) ×1
BLADE EXCALIBUR 4.0X13 (MISCELLANEOUS) ×1 IMPLANT
BLADE SHAVER TORPEDO 4X13 (MISCELLANEOUS) ×2 IMPLANT
BLADE SURG SZ11 CARB STEEL (BLADE) ×3 IMPLANT
CANNULA DRILOCK 5.0MMX75MM (CANNULA) ×2
CANNULA DRILOCK 5.0X75 (CANNULA) ×2 IMPLANT
CHLORAPREP W/TINT 26 (MISCELLANEOUS) ×3 IMPLANT
CLOSURE WOUND 1/2 X4 (GAUZE/BANDAGES/DRESSINGS) ×1
CLOTH BEACON ORANGE TIMEOUT ST (SAFETY) ×3 IMPLANT
COVER LIGHT HANDLE STERIS (MISCELLANEOUS) ×6 IMPLANT
COVER WAND RF STERILE (DRAPES) ×3 IMPLANT
CUTTER BONE 4.0MM X 13CM (MISCELLANEOUS) ×2 IMPLANT
DECANTER SPIKE VIAL GLASS SM (MISCELLANEOUS) ×3 IMPLANT
DRAPE HALF SHEET 40X57 (DRAPES) ×3 IMPLANT
DRAPE SHOULDER BEACH CHAIR (DRAPES) ×3 IMPLANT
DRAPE U-SHAPE 47X51 STRL (DRAPES) ×3 IMPLANT
DRESSING MEPILEX BORDER 6X8 (GAUZE/BANDAGES/DRESSINGS) ×2 IMPLANT
DRSG MEPILEX BORDER 6X8 (GAUZE/BANDAGES/DRESSINGS) ×3
DRSG TEGADERM 2-3/8X2-3/4 SM (GAUZE/BANDAGES/DRESSINGS) ×3 IMPLANT
ELECT REM PT RETURN 9FT ADLT (ELECTROSURGICAL) ×3
ELECTRODE REM PT RTRN 9FT ADLT (ELECTROSURGICAL) ×1 IMPLANT
GAUZE SPONGE 4X4 12PLY STRL (GAUZE/BANDAGES/DRESSINGS) ×3 IMPLANT
GLOVE BIO SURGEON STRL SZ7 (GLOVE) ×2 IMPLANT
GLOVE BIOGEL PI IND STRL 7.0 (GLOVE) ×3 IMPLANT
GLOVE BIOGEL PI INDICATOR 7.0 (GLOVE) ×6
GLOVE ECLIPSE 6.5 STRL STRAW (GLOVE) ×2 IMPLANT
GLOVE SKINSENSE NS SZ8.0 LF (GLOVE) ×4
GLOVE SKINSENSE STRL SZ8.0 LF (GLOVE) ×2 IMPLANT
GLOVE SS N UNI LF 8.5 STRL (GLOVE) ×3 IMPLANT
GOWN STRL REUS W/TWL LRG LVL3 (GOWN DISPOSABLE) ×3 IMPLANT
GOWN STRL REUS W/TWL XL LVL3 (GOWN DISPOSABLE) ×3 IMPLANT
IV NS IRRIG 3000ML ARTHROMATIC (IV SOLUTION) ×10 IMPLANT
KIT BLADEGUARD II DBL (SET/KITS/TRAYS/PACK) ×3 IMPLANT
KIT POSITION SHOULDER SCHLEI (MISCELLANEOUS) ×3 IMPLANT
KIT TURNOVER KIT A (KITS) ×3 IMPLANT
MANIFOLD NEPTUNE II (INSTRUMENTS) ×3 IMPLANT
MARKER SKIN DUAL TIP RULER LAB (MISCELLANEOUS) ×3 IMPLANT
NDL HYPO 21X1.5 SAFETY (NEEDLE) ×1 IMPLANT
NDL SCORPION MULTI FIRE (NEEDLE) IMPLANT
NDL SPNL 18GX3.5 QUINCKE PK (NEEDLE) ×1 IMPLANT
NEEDLE HYPO 21X1.5 SAFETY (NEEDLE) ×3 IMPLANT
NEEDLE SCORPION MULTI FIRE (NEEDLE) ×3 IMPLANT
NEEDLE SPNL 18GX3.5 QUINCKE PK (NEEDLE) ×3 IMPLANT
NS IRRIG 1000ML POUR BTL (IV SOLUTION) ×3 IMPLANT
PACK TOTAL JOINT (CUSTOM PROCEDURE TRAY) ×2 IMPLANT
PAD ARMBOARD 7.5X6 YLW CONV (MISCELLANEOUS) ×3 IMPLANT
PROBE APOLLO 90XL (SURGICAL WAND) ×2 IMPLANT
SET ARTHROSCOPY INST (INSTRUMENTS) ×3 IMPLANT
SET BASIN LINEN APH (SET/KITS/TRAYS/PACK) ×3 IMPLANT
SLING ARM FOAM STRAP LRG (SOFTGOODS) ×3 IMPLANT
SPONGE GAUZE 2X2 8PLY STER LF (GAUZE/BANDAGES/DRESSINGS) ×1
SPONGE GAUZE 2X2 8PLY STRL LF (GAUZE/BANDAGES/DRESSINGS) ×2 IMPLANT
STRIP CLOSURE SKIN 1/2X4 (GAUZE/BANDAGES/DRESSINGS) ×1 IMPLANT
SUT ETHILON 3 0 FSL (SUTURE) ×3 IMPLANT
SUT TIGER TAPE 7 IN WHITE (SUTURE) ×2 IMPLANT
SYR 30ML LL (SYRINGE) ×3 IMPLANT
SYR BULB IRRIG 60ML STRL (SYRINGE) ×2 IMPLANT
TOWEL OR 17X26 4PK STRL BLUE (TOWEL DISPOSABLE) ×3 IMPLANT
TUBING IN/OUT FLOW W/MAIN PUMP (TUBING) ×2 IMPLANT

## 2019-11-06 NOTE — Anesthesia Preprocedure Evaluation (Signed)
Anesthesia Evaluation  Patient identified by MRN, date of birth, ID band Patient awake    Reviewed: Allergy & Precautions, H&P , NPO status , Patient's Chart, lab work & pertinent test results, reviewed documented beta blocker date and time   History of Anesthesia Complications (+) PONV and history of anesthetic complications  Airway Mallampati: II  TM Distance: >3 FB Neck ROM: full    Dental no notable dental hx. (+) Teeth Intact   Pulmonary neg pulmonary ROS,    Pulmonary exam normal breath sounds clear to auscultation       Cardiovascular Exercise Tolerance: Good negative cardio ROS   Rhythm:regular Rate:Normal     Neuro/Psych negative neurological ROS  negative psych ROS   GI/Hepatic Neg liver ROS, GERD  Medicated,  Endo/Other  negative endocrine ROS  Renal/GU negative Renal ROS  negative genitourinary   Musculoskeletal   Abdominal   Peds  Hematology negative hematology ROS (+)   Anesthesia Other Findings   Reproductive/Obstetrics negative OB ROS                             Anesthesia Physical Anesthesia Plan  ASA: II  Anesthesia Plan: General   Post-op Pain Management:  Regional for Post-op pain   Induction:   PONV Risk Score and Plan: 4 or greater and Scopolamine patch - Pre-op  Airway Management Planned:   Additional Equipment:   Intra-op Plan:   Post-operative Plan:   Informed Consent: I have reviewed the patients History and Physical, chart, labs and discussed the procedure including the risks, benefits and alternatives for the proposed anesthesia with the patient or authorized representative who has indicated his/her understanding and acceptance.     Dental Advisory Given  Plan Discussed with: CRNA  Anesthesia Plan Comments:         Anesthesia Quick Evaluation

## 2019-11-06 NOTE — Brief Op Note (Signed)
11/06/2019  9:33 AM  PATIENT:  Laurie Kelley  49 y.o. female  PRE-OPERATIVE DIAGNOSIS:  torn rotator cuff / calcific tendonitis right shoulder  POST-OPERATIVE DIAGNOSIS:  torn rotator cuff / calcific tendonitis and bursitis right shoulder /full-thickness defect rotator cuff after debridement  PROCEDURE:  Procedure(s) with comments: ARTHROSCOPY RIGHT SHOULDER debridement x 2 structures (acromion and rotator cuff intra-articularly) WITH OPEN ROTATOR CUFF REPAIR (Right) - with scalene block   Implants Arthrex tiger tape 1 swivel lock suture anchor, repair type: Speed fix   SURGEON:  Surgeon(s) and Role:    * Carole Civil, MD - Primary  Assistant [facility]  Anesthesia General with scalene block  Minimal blood loss   BLOOD ADMINISTERED:none  DRAINS: none   LOCAL MEDICATIONS USED:  MARCAINE     SPECIMEN:  No Specimen  DISPOSITION OF SPECIMEN:  N/A  COUNTS:  YES  TOURNIQUET:  * No tourniquets in log *  DICTATION: .Dragon Dictation  PLAN OF CARE: Discharge to home after PACU  PATIENT DISPOSITION:  PACU - hemodynamically stable.   Delay start of Pharmacological VTE agent (>24hrs) due to surgical blood loss or risk of bleeding: not applicable  The procedure was performed as follows.  The patient was seen in the preop area and right shoulder was confirmed the surgical site and marked with surgeon's initials.  Chart review image review implant review was done prior.  After scalene block on the right side she was taken to the operating for general anesthesia.  She was then placed in the modified beachchair position with appropriate padding upper and lower extremities and torso  I examined the arm under anesthesia normal motion with no instability  After sterile prep and drape timeout was executed and the surgical site was confirmed along with antibiotic administration  I made a posterior portal and placed the scope into the joint.  There was some inflammation  noted in the joint primarily the biceps tendon region and in the anterior shoulder joint.  The biceps anchor was intact the anterior and posterior labrum was intact.  The glenohumeral surfaces were normal.  The delamination described on the MRI was not found.  With the arm in abduction and external rotation just posterior to the biceps tendon there was undersurface tearing of the rotator cuff this was debrided through an anterior portal.  The remaining portion of the tendon was 85%.  Posterior portion of the cuff was also noted to be intact with no tearing  The cuff was attached right at the articular margin there was no Postel lesion  The scope was then placed into the subacromial space where a bursectomy and acromioplasty was performed secondary to the impinging bone seen on MRI from the acromion over the anterior surface or superior surface of the rotator cuff  This was done through a separate anterolateral portal with a bone cutter from the Arthrex shaver system.  An ArthroCare wand was used to define the acromion and a shaver was used to debride the bursa prior to resection of the anterior and lateral edge of the undersurface of the acromion.  I then took a needle through the same portal and examined the cuff superiorly there was a small area consistent with the MRI where the calcific tendinitis was found this was needled and debrided with a shaver.  This left a 5 mm rent in the cuff.  I then made an open incision through an anterolateral incision divided the skin down to the deltoid fascia and split  the deltoid fascia up to the acromion without attaching it.  I then debrided the remaining cuff of the remaining calcific deposit and then placed in inverted mattress suture using Tiger tape and then used a speed fix construction with a swivel lock suture anchor to fix the cuff.  This gave an excellent watertight repair with no tension  The joint was irrigated the deltoid split was closed with 0  Monocryl the subcutaneous tissue was closed with 1 layer of 0 Monocryl in 1 layer of 2-0 Monocryl and then Steri-Strips using benzoin to assist with it he is seen the tape to the skin I also used 2-0 Monocryl to close the scope portals  A sling was applied and the patient was extubated and taken to recovery room in stable condition  [id number]

## 2019-11-06 NOTE — Op Note (Signed)
11/06/2019  9:33 AM  PATIENT:  Laurie Kelley  49 y.o. female  PRE-OPERATIVE DIAGNOSIS:  torn rotator cuff / calcific tendonitis right shoulder  POST-OPERATIVE DIAGNOSIS:  torn rotator cuff / calcific tendonitis and bursitis right shoulder /full-thickness defect rotator cuff after debridement  PROCEDURE:  Procedure(s) with comments: ARTHROSCOPY RIGHT SHOULDER debridement x 2 structures (acromion and rotator cuff intra-articularly) WITH OPEN ROTATOR CUFF REPAIR (Right) - with scalene block   Implants Arthrex tiger tape 1 swivel lock suture anchor, repair type: Speed fix   SURGEON:  Surgeon(s) and Role:    * Carole Civil, MD - Primary  Assistant [facility]  Anesthesia General with scalene block  Minimal blood loss   BLOOD ADMINISTERED:none  DRAINS: none   LOCAL MEDICATIONS USED:  MARCAINE     SPECIMEN:  No Specimen  DISPOSITION OF SPECIMEN:  N/A  COUNTS:  YES  TOURNIQUET:  * No tourniquets in log *  DICTATION: .Dragon Dictation  PLAN OF CARE: Discharge to home after PACU  PATIENT DISPOSITION:  PACU - hemodynamically stable.   Delay start of Pharmacological VTE agent (>24hrs) due to surgical blood loss or risk of bleeding: not applicable  The procedure was performed as follows.  The patient was seen in the preop area and right shoulder was confirmed the surgical site and marked with surgeon's initials.  Chart review image review implant review was done prior.  After scalene block on the right side she was taken to the operating for general anesthesia.  She was then placed in the modified beachchair position with appropriate padding upper and lower extremities and torso  I examined the arm under anesthesia normal motion with no instability  After sterile prep and drape timeout was executed and the surgical site was confirmed along with antibiotic administration  I made a posterior portal and placed the scope into the joint.  There was some inflammation  noted in the joint primarily the biceps tendon region and in the anterior shoulder joint.  The biceps anchor was intact the anterior and posterior labrum was intact.  The glenohumeral surfaces were normal.  The delamination described on the MRI was not found.  With the arm in abduction and external rotation just posterior to the biceps tendon there was undersurface tearing of the rotator cuff this was debrided through an anterior portal.  The remaining portion of the tendon was 85%.  Posterior portion of the cuff was also noted to be intact with no tearing  The cuff was attached right at the articular margin there was no Postel lesion  The scope was then placed into the subacromial space where a bursectomy and acromioplasty was performed secondary to the impinging bone seen on MRI from the acromion over the anterior surface or superior surface of the rotator cuff  This was done through a separate anterolateral portal with a bone cutter from the Arthrex shaver system.  An ArthroCare wand was used to define the acromion and a shaver was used to debride the bursa prior to resection of the anterior and lateral edge of the undersurface of the acromion.  I then took a needle through the same portal and examined the cuff superiorly there was a small area consistent with the MRI where the calcific tendinitis was found this was needled and debrided with a shaver.  This left a 5 mm rent in the cuff.  I then made an open incision through an anterolateral incision divided the skin down to the deltoid fascia and split  the deltoid fascia up to the acromion without attaching it.  I then debrided the remaining cuff of the remaining calcific deposit and then placed in inverted mattress suture using Tiger tape and then used a speed fix construction with a swivel lock suture anchor to fix the cuff.  This gave an excellent watertight repair with no tension  The joint was irrigated the deltoid split was closed with 0  Monocryl the subcutaneous tissue was closed with 1 layer of 0 Monocryl in 1 layer of 2-0 Monocryl and then Steri-Strips using benzoin to assist with it he is seen the tape to the skin I also used 2-0 Monocryl to close the scope portals  A sling was applied and the patient was extubated and taken to recovery room in stable condition  [id number] [id number]

## 2019-11-06 NOTE — Interval H&P Note (Signed)
History and Physical Interval Note:  11/06/2019 7:30 AM  Laurie Kelley  has presented today for surgery, with the diagnosis of torn rotator cuff / calcific tendonitis right shoulder.  The various methods of treatment have been discussed with the patient and family. After consideration of risks, benefits and other options for treatment, the patient has consented to  Procedure(s) with comments: ARTHROSCOPY RIGHT SHOULDER WITH OPEN ROTATOR CUFF REPAIR (Right) - with scalene block  as a surgical intervention.  The patient's history has been reviewed, patient examined, no change in status, stable for surgery.  I have reviewed the patient's chart and labs.  Questions were answered to the patient's satisfaction.     Arther Abbott

## 2019-11-06 NOTE — Transfer of Care (Signed)
Immediate Anesthesia Transfer of Care Note  Patient: Laurie Kelley  Procedure(s) Performed: ARTHROSCOPY RIGHT SHOULDER debridement two WITH OPEN ROTATOR CUFF REPAIR (Right Shoulder)  Patient Location: PACU  Anesthesia Type:GA combined with regional for post-op pain  Level of Consciousness: awake, alert , oriented and patient cooperative  Airway & Oxygen Therapy: Patient Spontanous Breathing and Patient connected to nasal cannula oxygen  Post-op Assessment: Report given to RN and Post -op Vital signs reviewed and stable  Post vital signs: Reviewed and stable  Last Vitals:  Vitals Value Taken Time  BP 124/51 11/06/19 0941  Temp    Pulse 80 11/06/19 0943  Resp 27 11/06/19 0943  SpO2 95 % 11/06/19 0943  Vitals shown include unvalidated device data.  Last Pain:  Vitals:   11/06/19 0701  TempSrc: Oral  PainSc: 0-No pain      Patients Stated Pain Goal: 5 ([id number])  Complications: No apparent anesthesia complications

## 2019-11-06 NOTE — Discharge Instructions (Signed)
PLEASE REMOVE THE SCOPOLAMINE PATCH ( USED FOR NAUSEA)  BEHIND YOUR RIGHT EAR ON Sunday MAY 23,2021 AND [facility] HANDS AFTER REMOVAL     General Anesthesia, Adult, Care After This sheet gives you information about how to care for yourself after your procedure. Your health care provider may also give you more specific instructions. If you have problems or questions, contact your health care provider. What can I expect after the procedure? After the procedure, the following side effects are common:  Pain or discomfort at the IV site.  Nausea.  Vomiting.  Sore throat.  Trouble concentrating.  Feeling cold or chills.  Weak or tired.  Sleepiness and fatigue.  Soreness and body aches. These side effects can affect parts of the body that were not involved in surgery. Follow these instructions at home:  For at least 24 hours after the procedure:  Have a responsible adult stay with you. It is important to have someone help care for you until you are awake and alert.  Rest as needed.  Do not: ? Participate in activities in which you could fall or become injured. ? Drive. ? Use heavy machinery. ? Drink alcohol. ? Take sleeping pills or medicines that cause drowsiness. ? Make important decisions or sign legal documents. ? Take care of children on your own. Eating and drinking  Follow any instructions from your health care provider about eating or drinking restrictions.  When you feel hungry, start by eating small amounts of foods that are soft and easy to digest (bland), such as toast. Gradually return to your regular diet.  Drink enough fluid to keep your urine pale yellow.  If you vomit, rehydrate by drinking water, juice, or clear broth. General instructions  If you have sleep apnea, surgery and certain medicines can increase your risk for breathing problems. Follow instructions from your health care provider about wearing your sleep device: ? Anytime you are sleeping,  including during daytime naps. ? While taking prescription pain medicines, sleeping medicines, or medicines that make you drowsy.  Return to your normal activities as told by your health care provider. Ask your health care provider what activities are safe for you.  Take over-the-counter and prescription medicines only as told by your health care provider.  If you smoke, do not smoke without supervision.  Keep all follow-up visits as told by your health care provider. This is important. Contact a health care provider if:  You have nausea or vomiting that does not get better with medicine.  You cannot eat or drink without vomiting.  You have pain that does not get better with medicine.  You are unable to pass urine.  You develop a skin rash.  You have a fever.  You have redness around your IV site that gets worse. Get help right away if:  You have difficulty breathing.  You have chest pain.  You have blood in your urine or stool, or you vomit blood. Summary  After the procedure, it is common to have a sore throat or nausea. It is also common to feel tired.  Have a responsible adult stay with you for the first 24 hours after general anesthesia. It is important to have someone help care for you until you are awake and alert.  When you feel hungry, start by eating small amounts of foods that are soft and easy to digest (bland), such as toast. Gradually return to your regular diet.  Drink enough fluid to keep your urine pale yellow.  Return to your normal activities as told by your health care provider. Ask your health care provider what activities are safe for you. This information is not intended to replace advice given to you by your health care provider. Make sure you discuss any questions you have with your health care provider. Document Revised: 06/08/2017 Document Reviewed: 01/19/2017 Elsevier Patient Education  [place].    Interscalene Nerve Block, Care  After This sheet gives you information about how to care for yourself after your procedure. Your health care provider may also give you more specific instructions. If you have problems or questions, contact your health care provider. What can I expect after the procedure? After the procedure, it is common to have:  Soreness or tenderness in your neck.  Numbness in your shoulder, upper arm, and some fingers.  Weakness in your shoulder and arm muscles. The feeling and strength in your shoulder, arm, and fingers should return to normal within hours after your procedure. Follow these instructions at home: For at least 24 hours after the procedure:  Do not: ? Participate in activities in which you could fall or become injured. ? Drive. ? Use heavy machinery. ? Drink alcohol. ? Take sleeping pills or medicines that cause drowsiness. ? Make important decisions or sign legal documents. ? Take care of children on your own.  Rest. Eating and drinking  If you vomit, drink water, juice, or soup when you can drink without vomiting.  Make sure you have little or no nausea before eating solid foods.  Follow the diet that is recommended by your health care provider. If you have a sling:  Wear it as told by your health care provider. Remove it only as told by your health care provider.  Loosen the sling if your fingers tingle, become numb, or turn cold and blue.  Make sure that your entire arm, including your wrist, is supported. Do not allow your wrist to dangle over the end of the sling.  Do not let your sling get wet if it is not waterproof.  Keep the sling clean. Bathing  Do not take baths, swim, or use a hot tub until your health care provider approves.  If you have a nerve block catheter in place, keep the incision site and tubing dry. Injection site care   Wash your hands with soap and water before you change your bandage (dressing). If soap and water are not available, use  hand sanitizer.  Change your dressing as told by your health care provider.  Keep your dressing dry.  Check your nerve block injection site every day for signs of infection. Check for: ? Redness, swelling, or pain. ? Fluid or blood. ? Warmth. Activity  Do not perform complex or risky activities while taking prescription pain medicine and until you have fully recovered.  Return to your normal activities as told by your health care provider and as you can tolerate them. Ask your health care provider what activities are safe for you.  Rest and take it easy. This will help you heal and recover more quickly and fully.  Be very cautious until you have regained strength and sensation. General instructions  Have a responsible adult stay with you until you are awake and alert.  Do not drive or use heavy machinery while taking prescription pain medicine and until you have fully recovered. Ask your health care provider when it is safe to drive.  Take over-the-counter and prescription medicines only as told by your  health care provider.  If you smoke, do not smoke without supervision.  Do not expose your arm or shoulder to very cold or very hot temperatures until you have full feeling back.  If you have a nerve block catheter in place: ? Try to keep the catheter from getting kinked or pinched. ? Avoid pulling or tugging on the catheter.  Keep all follow-up visits as told by your health care provider. This is important. Contact a health care provider if:  You have chills or fever.  You have redness, swelling, or pain around your injection site.  You have fluid or blood coming from the injection site.  The skin around the injection site is warm to the touch.  There is a bad smell coming from your dressing.  You have hoarseness or a drooping or dry eye that lasts more than a few days.  You have pain that is poorly controlled with the block or with pain medicine.  You have  numbness, tingling, or weakness in your shoulder or arm that lasts for more than one week. Get help right away if:  You have severe pain.  You lose or do not regain strength and sensation in your arm even after the nerve block medicine has stopped.  You have trouble breathing.  You have a nerve block catheter still in place and you begin to shiver.  You have a nerve block catheter still in place and you are getting more and more numb or weak. This information is not intended to replace advice given to you by your health care provider. Make sure you discuss any questions you have with your health care provider. Document Revised: 06/08/2017 Document Reviewed: 02/04/2016 Elsevier Patient Education  2020 [employer].

## 2019-11-06 NOTE — Anesthesia Postprocedure Evaluation (Signed)
Anesthesia Post Note  Patient: Laurie Kelley  Procedure(s) Performed: ARTHROSCOPY RIGHT SHOULDER debridement two WITH OPEN ROTATOR CUFF REPAIR (Right Shoulder)  Patient location during evaluation: PACU Anesthesia Type: General Level of consciousness: awake and alert, oriented and patient cooperative Pain management: pain level controlled Vital Signs Assessment: post-procedure vital signs reviewed and stable Respiratory status: spontaneous breathing, respiratory function stable and patient connected to nasal cannula oxygen Cardiovascular status: blood pressure returned to baseline and stable Postop Assessment: no apparent nausea or vomiting Anesthetic complications: no     Last Vitals:  Vitals:   11/06/19 0701  BP: 118/88  Pulse: 78  Resp: 16  Temp: 36.9 C  SpO2: 98%    Last Pain:  Vitals:   11/06/19 0701  TempSrc: Oral  PainSc: 0-No pain                 Samatha Anspach

## 2019-11-06 NOTE — Anesthesia Procedure Notes (Signed)
Anesthesia Regional Block: Interscalene brachial plexus block   Pre-Anesthetic Checklist: ,, timeout performed, Correct Patient, Correct Site, Correct Laterality, Correct Procedure, Correct Position, site marked, Risks and benefits discussed,  Surgical consent,  Pre-op evaluation,  At surgeon's request and post-op pain management  Laterality: Right  Prep: chloraprep       Needles:  Injection technique: Single-shot  Needle Type: Stimulator Needle - 40     Needle Length: 5cm  Needle Gauge: 22     Additional Needles:   Procedures:, nerve stimulator,,, ultrasound used (permanent image in chart),,,,   Nerve Stimulator or Paresthesia:  Response: thenar twitch, 0.4 mA,   Additional Responses:   Narrative:  Start time: 11/06/2019 7:21 AM End time: 11/06/2019 7:28 AM Injection made incrementally with aspirations every 20 mL.

## 2019-11-11 ENCOUNTER — Ambulatory Visit (HOSPITAL_COMMUNITY): Payer: Commercial Managed Care - PPO

## 2019-11-11 DIAGNOSIS — Z9889 Other specified postprocedural states: Secondary | ICD-10-CM | POA: Insufficient documentation

## 2019-11-13 ENCOUNTER — Other Ambulatory Visit: Payer: Self-pay

## 2019-11-13 ENCOUNTER — Ambulatory Visit (INDEPENDENT_AMBULATORY_CARE_PROVIDER_SITE_OTHER): Payer: Commercial Managed Care - PPO | Admitting: Orthopedic Surgery

## 2019-11-13 DIAGNOSIS — Z9889 Other specified postprocedural states: Secondary | ICD-10-CM

## 2019-11-13 MED ORDER — ACETAMINOPHEN-CODEINE #3 300-30 MG PO TABS: 1.0000 | ORAL_TABLET | Freq: Four times a day (QID) | ORAL | 0 refills | Status: AC | PRN

## 2019-11-13 NOTE — Patient Instructions (Signed)
Start therapy call [phone]   Sling 3 more weeks

## 2019-11-13 NOTE — Progress Notes (Signed)
Chief Complaint  Patient presents with  . Follow-up    Recheck on right shoulder, DOS 11-06-19.   Laurie Kelley overall is doing well she is having some nighttime nausea she has some Phenergan for that and has not used at all yet  The hydrocodone does give her hallucinations she like an alternative medication if possible  Her wounds look clean she is neurovascular intact  No excessive swelling  Recommend start physical therapy changed to codeine use Phenergan as needed  Follow-up 3 weeks  She can take her sling off after 3 weeks but in the interim she can straighten the arm out 3 times a day with a sling off she can take the sling off when eating she is advised to not abduct or flex the arm actively at this time    Encounter Diagnosis  Name Primary?  . S/P arthroscopy of right shoulder for debridement of calcific tendonitis and open rotator cuff repair 11/06/19 Yes     PROCEDURE:  Procedure(s) with comments: ARTHROSCOPY RIGHT SHOULDER debridement x 2 structures (acromion and rotator cuff intra-articularly) WITH OPEN ROTATOR CUFF REPAIR (Right) - with scalene block   Implants Arthrex tiger tape 1 swivel lock suture anchor, repair type: Speed fix   SURGEON:  Surgeon(s) and Role:    Carole Civil, MD - Primary

## 2019-11-15 ENCOUNTER — Other Ambulatory Visit: Payer: Self-pay | Admitting: Family Medicine

## 2019-11-18 ENCOUNTER — Other Ambulatory Visit: Payer: Self-pay

## 2019-11-18 ENCOUNTER — Encounter: Payer: Self-pay | Admitting: Physical Therapy

## 2019-11-18 ENCOUNTER — Ambulatory Visit: Payer: Commercial Managed Care - PPO | Attending: Orthopedic Surgery | Admitting: Physical Therapy

## 2019-11-18 DIAGNOSIS — M6281 Muscle weakness (generalized): Secondary | ICD-10-CM

## 2019-11-18 DIAGNOSIS — M25611 Stiffness of right shoulder, not elsewhere classified: Secondary | ICD-10-CM

## 2019-11-18 DIAGNOSIS — R600 Localized edema: Secondary | ICD-10-CM | POA: Insufficient documentation

## 2019-11-18 DIAGNOSIS — M25511 Pain in right shoulder: Secondary | ICD-10-CM | POA: Diagnosis present

## 2019-11-18 NOTE — Therapy (Signed)
[facility] [place], [place], [address] Phone: [phone]   Fax:  [phone]  Physical Therapy Evaluation  Patient Details  Name: Laurie Kelley MRN: [id number] Date of Birth: May 31, 1971 Referring Provider (PT): Jorene Guest MD   Encounter Date: 11/18/2019  PT End of Session - 11/18/19 0826    Visit Number  1    Number of Visits  16    Date for PT Re-Evaluation  01/16/20    PT Start Time  0817    PT Stop Time  0905    PT Time Calculation (min)  48 min    Activity Tolerance  Patient tolerated treatment well    Behavior During Therapy  [facility] for tasks assessed/performed       Past Medical History:  Diagnosis Date  . Chest pain in adult 07/31/2017  . Esophageal reflux 08/18/2014  . Gallstones   . Hyperlipidemia with target LDL less than 100 08/18/2015  . Internal hemorrhoids 08/18/2014  . PONV (postoperative nausea and vomiting)     Past Surgical History:  Procedure Laterality Date  . CHOLECYSTECTOMY    . SHOULDER ARTHROSCOPY Right 11/06/2019   Procedure: ARTHROSCOPY RIGHT SHOULDER, DEBRIDEMENT X TWO STRUCTURES; OPEN ROTATOR CUFF REPAIR;  Surgeon: Carole Civil, MD;  Location: [facility];  Service: Orthopedics;  Laterality: Right;    There were no vitals filed for this visit.   Subjective Assessment - 11/18/19 0815    Subjective  Pt arriving to therpay s/p R arthroscopy for debridement of calcification tendonitis and open rotator cuff repair. Pt reporting 5/10 pain in R shoulder and reporting nausea. Pt states that she has been taking anti-nausea medication as needed.    Pertinent History  gall bladder removal, high cholesterol    Limitations  House hold activities    Currently in Pain?  Yes    Pain Score  5     Pain Location  Shoulder    Pain Orientation  Right    Pain Descriptors / Indicators  Sore;Aching    Pain Type  Surgical pain;Acute pain    Pain Onset  1 to 4 weeks ago    Pain Frequency  Constant     Aggravating Factors   movements    Pain Relieving Factors  ice, pain meds at night         [facility] PT Assessment - 11/18/19 0001      Assessment   Medical Diagnosis  R shoulder debridement and rotator cuff repair    Referring Provider (PT)  Jorene Guest MD    Onset Date/Surgical Date  11/06/19    Hand Dominance  Right    Prior Therapy  yes for left shoulder last year      Precautions   Precaution Comments  no abduction or active flexion, sling off on 12/04/2019      Restrictions   Weight Bearing Restrictions  No      Balance Screen   Has the patient fallen in the past 6 months  No      Prior Function   Level of Independence  Independent      Cognition   Overall Cognitive Status  Within Functional Limits for tasks assessed      Observation/Other Assessments   Focus on Therapeutic Outcomes (FOTO)   96% limitation (limited by MD protocol)      Posture/Postural Control   Posture/Postural Control  Postural limitations    Postural Limitations  Rounded Shoulders;Forward head  ROM / Strength   AROM / PROM / Strength  PROM;Strength      PROM   PROM Assessment Site  Shoulder;Elbow    Right/Left Shoulder  Right;Left    Right Shoulder Extension  20 Degrees    Left Shoulder Extension  55 Degrees    Left Shoulder Flexion  160 Degrees    Left Shoulder ABduction  155 Degrees    Left Shoulder Internal Rotation  --   thumb to T 6   Left Shoulder External Rotation  84 Degrees      Strength   Overall Strength Comments  not tested due to pain and shoulder precautions      Ambulation/Gait   Assistive device  None    Gait Pattern  Step-through pattern    Gait Comments  sling on R UE                  Objective measurements completed on examination: See above findings.      [place] Adult PT Treatment/Exercise - 11/18/19 0001      Exercises   Exercises  Shoulder      Shoulder Exercises: Standing   Other Standing Exercises  Pendulums clockwise and  counter clockwise, flexion       Shoulder Exercises: Stretch   Table Stretch - Flexion  5 reps    Table Stretch -Flexion Limitations  holding 5 seconds    Other Shoulder Stretches  table stretch scaption x 5 holding 5 seconds             PT Education - 11/18/19 0825    Education Details  PT POC, importance of ice, HEP    Person(s) Educated  Patient    Methods  Explanation;Demonstration;Handout    Comprehension  Verbalized understanding;Returned demonstration          PT Long Term Goals - 11/18/19 1142      PT LONG TERM GOAL #1   Title  Patient will be independent with HEP and progression.    Time  8    Period  Weeks    Status  New      PT LONG TERM GOAL #2   Title  Patient will report ability to perform basic ADL's with pain </= 3/10 in R shoulder.    Time  8    Period  Weeks    Status  New      PT LONG TERM GOAL #3   Title  Patient will improve her R shoulder flexion to >/= 140 degrees in order to improve ability for  functional tasks.    Baseline  see flowsheets    Time  8    Period  Weeks    Status  New      PT LONG TERM GOAL #4   Title  Pt will improve ER to >/= 45 degrees in order to improve functional mobility.             Plan - 11/18/19 1148    Clinical Impression Statement  Pt arriving for PT evalution s/p R shoulder arthroscopy for debridement of clacification tendonitis, with open rotatator cuff repair on 11/06/2019. MD ordered pt to stay in sling for 3 more weeks from last visit which was on 11/13/2019. Pt can remove sling on 12/04/2019. Also No abduction or active flexion. Pt presenting with weakness and decreased ROM in R UE with increased pain with all movements. Pt was edu in importance of Passive stretching, PT and ice for pain and inflammation.  Pt was issued PROM stretching for HEP. Skilled PT needed to address pt's impairments with the below interventions.    Examination-Activity Limitations  Bathing;Carry;Dressing;Lift;Reach  Overhead;Toileting    Examination-Participation Restrictions  Other;Yard Work;Community Activity;Cleaning    Stability/Clinical Decision Making  Stable/Uncomplicated    Clinical Decision Making  Low    Rehab Potential  Excellent    PT Frequency  2x / week    PT Duration  8 weeks    PT Treatment/Interventions  ADLs/Self Care Home Management;Cryotherapy;Electrical Stimulation;Moist Heat;Ultrasound;Therapeutic activities;Therapeutic exercise;Neuromuscular re-education;Patient/family education;Manual techniques;Passive range of motion;Taping;Vasopneumatic Device    PT Next Visit Plan  No active flexion, no abduction       Test grip strength, PROM, pendulums, table slides (fleixon/ scaption), modaliites as needed for pain, Vaso    PT Home Exercise Plan  pendulums, table slides    Consulted and Agree with Plan of Care  Patient       Patient will benefit from skilled therapeutic intervention in order to improve the following deficits and impairments:  Pain, Postural dysfunction, Decreased strength, Decreased range of motion, Impaired UE functional use, Increased edema  Visit Diagnosis: Acute pain of right shoulder  Stiffness of right shoulder, not elsewhere classified  Muscle weakness (generalized)  Localized edema     Problem List Patient Active Problem List   Diagnosis Date Noted  . S/P arthroscopy of right shoulder for debridement of calcific tendonitis and open rotator cuff repair  11/06/19 11/11/2019  . Nontraumatic incomplete tear of right rotator cuff   . Myalgia due to statin 12/23/2018  . Pain in joint of left shoulder 07/17/2018  . BMI 30.0-30.9,adult 07/04/2018  . Low HDL (under 40) 08/01/2017  . Hyperlipidemia with target LDL less than 100 08/18/2015  . Internal hemorrhoids 08/18/2014  . Esophageal reflux 08/18/2014    Oretha Caprice, PT, MPT 11/18/2019, 11:59 AM  [facility] [address] [place], [place],  [address] Phone: [phone]   Fax:  [phone]  Name: VIDA COLLMAN MRN: [id number] Date of Birth: [birth year]

## 2019-11-18 NOTE — Telephone Encounter (Signed)
Needs appointment for further refills

## 2019-11-18 NOTE — Patient Instructions (Signed)
Access Code: [email or web address] URL: [email or web address] Date: 11/18/2019 Prepared by: Kearney Hard  Exercises Circular Shoulder Pendulum with Table Support - 3-4 x daily - 7 x weekly Flexion-Extension Shoulder Pendulum with Table Support - 3-4 x daily - 7 x weekly Seated Shoulder Flexion Towel Slide at Table Top - 3-4 x daily - 7 x weekly - 2 sets - 10 reps - 5 seconds hold Seated Shoulder Scaption Slide at Table Top with Forearm in Neutral - 3-5 x daily - 7 x weekly - 2 sets - 10 reps - 5 seconds hold

## 2019-11-20 ENCOUNTER — Other Ambulatory Visit: Payer: Self-pay

## 2019-11-20 ENCOUNTER — Ambulatory Visit: Payer: Commercial Managed Care - PPO | Admitting: Physical Therapy

## 2019-11-20 ENCOUNTER — Encounter: Payer: Self-pay | Admitting: Physical Therapy

## 2019-11-20 DIAGNOSIS — M6281 Muscle weakness (generalized): Secondary | ICD-10-CM

## 2019-11-20 DIAGNOSIS — M25511 Pain in right shoulder: Secondary | ICD-10-CM

## 2019-11-20 DIAGNOSIS — M25611 Stiffness of right shoulder, not elsewhere classified: Secondary | ICD-10-CM

## 2019-11-20 DIAGNOSIS — R600 Localized edema: Secondary | ICD-10-CM

## 2019-11-20 NOTE — Therapy (Signed)
[facility] [place], [place], [address] Phone: [phone]   Fax:  [phone]  Physical Therapy Treatment  Patient Details  Name: Laurie Kelley MRN: [id number] Date of Birth: [birth year] Referring Provider (PT): Jorene Guest MD   Encounter Date: 11/20/2019  PT End of Session - 11/20/19 0826    Visit Number  2    Number of Visits  16    Date for PT Re-Evaluation  01/16/20    PT Start Time  0816    PT Stop Time  0906    PT Time Calculation (min)  50 min    Activity Tolerance  Patient tolerated treatment well    Behavior During Therapy  [facility] for tasks assessed/performed       Past Medical History:  Diagnosis Date  . Chest pain in adult 07/31/2017  . Esophageal reflux 08/18/2014  . Gallstones   . Hyperlipidemia with target LDL less than 100 08/18/2015  . Internal hemorrhoids 08/18/2014  . PONV (postoperative nausea and vomiting)     Past Surgical History:  Procedure Laterality Date  . CHOLECYSTECTOMY    . SHOULDER ARTHROSCOPY Right 11/06/2019   Procedure: ARTHROSCOPY RIGHT SHOULDER, DEBRIDEMENT X TWO STRUCTURES; OPEN ROTATOR CUFF REPAIR;  Surgeon: Carole Civil, MD;  Location: [facility];  Service: Orthopedics;  Laterality: Right;    There were no vitals filed for this visit.  Subjective Assessment - 11/20/19 [id number]    Subjective  COVID-19 scrren performed prior to therapy. Pt reporting 3-4/10 pain upon arrival. Pt reporting her exercises went better at home.    Pertinent History  gall bladder removal, high cholesterol    Limitations  House hold activities    Currently in Pain?  Yes    Pain Score  4     Pain Location  Shoulder    Pain Orientation  Right    Pain Descriptors / Indicators  Aching;Tightness    Pain Type  Surgical pain;Acute pain    Pain Onset  1 to 4 weeks ago         [facility] PT Assessment - 11/20/19 0001      Assessment   Medical Diagnosis  R shoulder debridement and rotator cuff repair    Referring Provider (PT)  Harrision, Stanley MD      PROM   Overall PROM Comments  R grip: 40ppsi    L grip: 52 ppsi    Right Shoulder Flexion  100 Degrees    Right Shoulder External Rotation  30 Degrees                    [facility] Adult PT Treatment/Exercise - 11/20/19 0001      Shoulder Exercises: Seated   Other Seated Exercises  table slides flexion and scaption x 20 holding 5-10 seconds each      Shoulder Exercises: Standing   Other Standing Exercises  Pendulums clockwise and counter clockwise, flexion       Modalities   Modalities  Vasopneumatic      Vasopneumatic   Number Minutes Vasopneumatic   15 minutes    Vasopnuematic Location   Shoulder    Vasopneumatic Pressure  Low    Vasopneumatic Temperature   34      Manual Therapy   Manual Therapy  Joint mobilization;Passive ROM    Manual therapy comments  25 minutes    Joint Mobilization  grade 2 mobs    Passive ROM  PROM: R shoulder flexion/ ER/ elbow flexion/ extension  PT Long Term Goals - 11/18/19 1142      PT LONG TERM GOAL #1   Title  Patient will be independent with HEP and progression.    Time  8    Period  Weeks    Status  New      PT LONG TERM GOAL #2   Title  Patient will report ability to perform basic ADL's with pain </= 3/10 in R shoulder.    Time  8    Period  Weeks    Status  New      PT LONG TERM GOAL #3   Title  Patient will improve her R shoulder flexion to >/= 140 degrees in order to improve ability for  functional tasks.    Baseline  see flowsheets    Time  8    Period  Weeks    Status  New      PT LONG TERM GOAL #4   Title  Pt will improve ER to >/= 45 degrees in order to improve functional mobility.            Plan - 11/20/19 0848    Clinical Impression Statement  Pt arriving to therapy today in sling and reporting 3-4/10 pain. Pt reporting compliance with her HEP. Pt with improvements in PROM. R shoulder flexion to 100 degrees and ER to 30  degrees. Continue skilled PT with concentration on PROM. Pt to follow up  with Dr. Aline Brochure on 12/05/2019.    Examination-Activity Limitations  Bathing;Carry;Dressing;Lift;Reach Overhead;Toileting    Examination-Participation Restrictions  Other;Yard Work;Community Activity;Cleaning    Stability/Clinical Decision Making  Stable/Uncomplicated    Rehab Potential  Excellent    PT Frequency  2x / week    PT Duration  8 weeks    PT Treatment/Interventions  ADLs/Self Care Home Management;Cryotherapy;Electrical Stimulation;Moist Heat;Ultrasound;Therapeutic activities;Therapeutic exercise;Neuromuscular re-education;Patient/family education;Manual techniques;Passive range of motion;Taping;Vasopneumatic Device    PT Next Visit Plan  No active flexion, no abduction       PROM, pendulums, table slides (fleixon/ scaption), modaliites as needed for pain, Vaso    PT Home Exercise Plan  pendulums, table slides       Patient will benefit from skilled therapeutic intervention in order to improve the following deficits and impairments:  Pain, Postural dysfunction, Decreased strength, Decreased range of motion, Impaired UE functional use, Increased edema  Visit Diagnosis: Acute pain of right shoulder  Stiffness of right shoulder, not elsewhere classified  Muscle weakness (generalized)  Localized edema     Problem List Patient Active Problem List   Diagnosis Date Noted  . S/P arthroscopy of right shoulder for debridement of calcific tendonitis and open rotator cuff repair  11/06/19 11/11/2019  . Nontraumatic incomplete tear of right rotator cuff   . Myalgia due to statin 12/23/2018  . Pain in joint of left shoulder 07/17/2018  . BMI 30.0-30.9,adult 07/04/2018  . Low HDL (under 40) 08/01/2017  . Hyperlipidemia with target LDL less than 100 08/18/2015  . Internal hemorrhoids 08/18/2014  . Esophageal reflux 08/18/2014    Oretha Caprice, PT, MPT 11/20/2019, 8:59 AM  [facility] [place], [place], [address] Phone: [phone]   Fax:  [phone]  Name: Laurie Kelley MRN: [id number] Date of Birth: 25-Dec-1970

## 2019-11-22 ENCOUNTER — Other Ambulatory Visit: Payer: Self-pay | Admitting: Orthopedic Surgery

## 2019-11-25 ENCOUNTER — Other Ambulatory Visit: Payer: Self-pay

## 2019-11-25 ENCOUNTER — Ambulatory Visit: Payer: Commercial Managed Care - PPO | Admitting: Physical Therapy

## 2019-11-25 DIAGNOSIS — M25611 Stiffness of right shoulder, not elsewhere classified: Secondary | ICD-10-CM

## 2019-11-25 DIAGNOSIS — M25511 Pain in right shoulder: Secondary | ICD-10-CM

## 2019-11-25 NOTE — Therapy (Signed)
[facility] [place], [place], [address] Phone: [phone]   Fax:  [phone]  Physical Therapy Treatment  Patient Details  Name: Laurie Kelley MRN: [id number] Date of Birth: [birth year] Referring Provider (PT): Jorene Guest MD   Encounter Date: 11/25/2019  PT End of Session - 11/25/19 0854    Visit Number  3    Number of Visits  16    Date for PT Re-Evaluation  01/16/20    PT Start Time  0816    PT Stop Time  0854    PT Time Calculation (min)  38 min    Activity Tolerance  Patient tolerated treatment well    Behavior During Therapy  [facility] for tasks assessed/performed       Past Medical History:  Diagnosis Date  . Chest pain in adult 07/31/2017  . Esophageal reflux 08/18/2014  . Gallstones   . Hyperlipidemia with target LDL less than 100 08/18/2015  . Internal hemorrhoids 08/18/2014  . PONV (postoperative nausea and vomiting)     Past Surgical History:  Procedure Laterality Date  . CHOLECYSTECTOMY    . SHOULDER ARTHROSCOPY Right 11/06/2019   Procedure: ARTHROSCOPY RIGHT SHOULDER, DEBRIDEMENT X TWO STRUCTURES; OPEN ROTATOR CUFF REPAIR;  Surgeon: Carole Civil, MD;  Location: [facility];  Service: Orthopedics;  Laterality: Right;    There were no vitals filed for this visit.  Subjective Assessment - 11/25/19 0850    Subjective  COVID-19 screen performed prior to patient entering clinic.  No new complaints.    Pertinent History  gall bladder removal, high cholesterol    Limitations  House hold activities    Currently in Pain?  Yes    Pain Score  4     Pain Location  Shoulder    Pain Orientation  Right    Pain Descriptors / Indicators  Aching;Tightness    Pain Onset  1 to 4 weeks ago                        [facility] Adult PT Treatment/Exercise - 11/25/19 0001      Modalities   Modalities  Vasopneumatic      Vasopneumatic   Number Minutes Vasopneumatic   10 minutes    Vasopnuematic Location    --   Right shoulder.   Vasopneumatic Pressure  --   Pillow between elbow and thorax.     Manual Therapy   Passive ROM  In supine:  Gentle PROM to patient's right shoulder x 23 minutes into flexion and ER.                  PT Long Term Goals - 11/18/19 1142      PT LONG TERM GOAL #1   Title  Patient will be independent with HEP and progression.    Time  8    Period  Weeks    Status  New      PT LONG TERM GOAL #2   Title  Patient will report ability to perform basic ADL's with pain </= 3/10 in R shoulder.    Time  8    Period  Weeks    Status  New      PT LONG TERM GOAL #3   Title  Patient will improve her R shoulder flexion to >/= 140 degrees in order to improve ability for  functional tasks.    Baseline  see flowsheets    Time  8  Period  Weeks    Status  New      PT LONG TERM GOAL #4   Title  Pt will improve ER to >/= 45 degrees in order to improve functional mobility.            Plan - 11/25/19 0854    Clinical Impression Statement  Continued with gentle right shoulder passive range of motion.  Patient complinat to use of her sling.  She tolerated treatment well.    Examination-Activity Limitations  Bathing;Carry;Dressing;Lift;Reach Overhead;Toileting    Examination-Participation Restrictions  Other;Yard Work;Community Activity;Cleaning    Stability/Clinical Decision Making  Stable/Uncomplicated    Rehab Potential  Excellent    PT Frequency  2x / week    PT Duration  8 weeks    PT Treatment/Interventions  ADLs/Self Care Home Management;Cryotherapy;Electrical Stimulation;Moist Heat;Ultrasound;Therapeutic activities;Therapeutic exercise;Neuromuscular re-education;Patient/family education;Manual techniques;Passive range of motion;Taping;Vasopneumatic Device    PT Next Visit Plan  No active flexion, no abduction       PROM, pendulums, table slides (fleixon/ scaption), modaliites as needed for pain, Vaso    PT Home Exercise Plan  pendulums, table slides     Consulted and Agree with Plan of Care  Patient       Patient will benefit from skilled therapeutic intervention in order to improve the following deficits and impairments:  Pain, Postural dysfunction, Decreased strength, Decreased range of motion, Impaired UE functional use, Increased edema  Visit Diagnosis: Acute pain of right shoulder  Stiffness of right shoulder, not elsewhere classified     Problem List Patient Active Problem List   Diagnosis Date Noted  . S/P arthroscopy of right shoulder for debridement of calcific tendonitis and open rotator cuff repair  11/06/19 11/11/2019  . Nontraumatic incomplete tear of right rotator cuff   . Myalgia due to statin 12/23/2018  . Pain in joint of left shoulder 07/17/2018  . BMI 30.0-30.9,adult 07/04/2018  . Low HDL (under 40) 08/01/2017  . Hyperlipidemia with target LDL less than 100 08/18/2015  . Internal hemorrhoids 08/18/2014  . Esophageal reflux 08/18/2014    Vinicio Lynk, [place] MPT 11/25/2019, 9:02 AM  [facility] [address] [place], [place], [address] Phone: [phone]   Fax:  [phone]  Name: Laurie Kelley MRN: [id number] Date of Birth: 03/09/1971

## 2019-11-27 ENCOUNTER — Ambulatory Visit: Payer: Commercial Managed Care - PPO | Admitting: Physical Therapy

## 2019-11-27 ENCOUNTER — Other Ambulatory Visit: Payer: Self-pay

## 2019-11-27 ENCOUNTER — Encounter: Payer: Self-pay | Admitting: Physical Therapy

## 2019-11-27 DIAGNOSIS — M25611 Stiffness of right shoulder, not elsewhere classified: Secondary | ICD-10-CM

## 2019-11-27 DIAGNOSIS — M6281 Muscle weakness (generalized): Secondary | ICD-10-CM

## 2019-11-27 DIAGNOSIS — M25511 Pain in right shoulder: Secondary | ICD-10-CM | POA: Diagnosis not present

## 2019-11-27 DIAGNOSIS — R600 Localized edema: Secondary | ICD-10-CM

## 2019-11-27 NOTE — Therapy (Signed)
[facility] [place], [place], [address] Phone: [phone]   Fax:  [phone]  Physical Therapy Treatment  Patient Details  Name: Laurie Kelley MRN: [id number] Date of Birth: 18-Apr-1971 Referring Provider (PT): Jorene Guest MD   Encounter Date: 11/27/2019   PT End of Session - 11/27/19 0859    Visit Number 4    Number of Visits 16    Date for PT Re-Evaluation 01/16/20    PT Start Time 0815    PT Stop Time 0901    PT Time Calculation (min) 46 min    Behavior During Therapy [facility] for tasks assessed/performed           Past Medical History:  Diagnosis Date  . Chest pain in adult 07/31/2017  . Esophageal reflux 08/18/2014  . Gallstones   . Hyperlipidemia with target LDL less than 100 08/18/2015  . Internal hemorrhoids 08/18/2014  . PONV (postoperative nausea and vomiting)     Past Surgical History:  Procedure Laterality Date  . CHOLECYSTECTOMY    . SHOULDER ARTHROSCOPY Right 11/06/2019   Procedure: ARTHROSCOPY RIGHT SHOULDER, DEBRIDEMENT X TWO STRUCTURES; OPEN ROTATOR CUFF REPAIR;  Surgeon: Carole Civil, MD;  Location: [facility];  Service: Orthopedics;  Laterality: Right;    There were no vitals filed for this visit.   Subjective Assessment - 11/27/19 0858    Subjective COVID-19 screen performed prior to patient entering clinic.  Patient arrives with 4/10 pain in R shoulder.    Pertinent History gall bladder removal, high cholesterol    Limitations House hold activities    Currently in Pain? Yes    Pain Score 4     Pain Location Shoulder    Pain Orientation Right    Pain Descriptors / Indicators Aching;Tightness    Pain Type Surgical pain    Pain Onset 1 to 4 weeks ago    Pain Frequency Constant              [facility] PT Assessment - 11/27/19 0001      Assessment   Medical Diagnosis R shoulder debridement and rotator cuff repair    Referring Provider (PT) Jorene Guest MD    Onset  Date/Surgical Date 11/06/19    Hand Dominance Right    Next MD Visit 12/05/2019    Prior Therapy yes for left shoulder last year      Precautions   Precaution Comments no abduction or active flexion, sling off on 12/04/2019                         [facility] Adult PT Treatment/Exercise - 11/27/19 0001      Modalities   Modalities Vasopneumatic      Vasopneumatic   Number Minutes Vasopneumatic  10 minutes    Vasopnuematic Location  Shoulder    Vasopneumatic Pressure Low    Vasopneumatic Temperature  34      Manual Therapy   Manual Therapy Passive ROM    Passive ROM PROM to right shoulder into flexion and ER. frequent oscillations to decrease pain and muscle guarding                       PT Long Term Goals - 11/18/19 1142      PT LONG TERM GOAL #1   Title Patient will be independent with HEP and progression.    Time 8    Period Weeks    Status New  PT LONG TERM GOAL #2   Title Patient will report ability to perform basic ADL's with pain </= 3/10 in R shoulder.    Time 8    Period Weeks    Status New      PT LONG TERM GOAL #3   Title Patient will improve her R shoulder flexion to >/= 140 degrees in order to improve ability for  functional tasks.    Baseline see flowsheets    Time 8    Period Weeks    Status New      PT LONG TERM GOAL #4   Title Pt will improve ER to >/= 45 degrees in order to improve functional mobility.                 Plan - 11/27/19 0931    Clinical Impression Statement Patient arrives to physical therapy with ongoing 4/10 pain in her right shoulder. Patient noted with intermittent guarding with R shoulder PROM particuarly with flexion. Frequent oscillations required to decrease muscle guarding. Patient reports increased sensitivity with gentle STW/M to R shoulder therefore STW/M limited during session. No adverse affects upon removal of modalities.    Examination-Activity Limitations  Bathing;Carry;Dressing;Lift;Reach Overhead;Toileting    Examination-Participation Restrictions Other;Yard Work;Community Activity;Cleaning    Stability/Clinical Decision Making Stable/Uncomplicated    Clinical Decision Making Low    Rehab Potential Excellent    PT Frequency 2x / week    PT Duration 8 weeks    PT Treatment/Interventions ADLs/Self Care Home Management;Cryotherapy;Electrical Stimulation;Moist Heat;Ultrasound;Therapeutic activities;Therapeutic exercise;Neuromuscular re-education;Patient/family education;Manual techniques;Passive range of motion;Taping;Vasopneumatic Device    PT Next Visit Plan No active flexion, no abduction       PROM, pendulums, table slides (fleixon/ scaption), modaliites as needed for pain, Vaso    PT Home Exercise Plan pendulums, table slides    Consulted and Agree with Plan of Care Patient           Patient will benefit from skilled therapeutic intervention in order to improve the following deficits and impairments:  Pain, Postural dysfunction, Decreased strength, Decreased range of motion, Impaired UE functional use, Increased edema  Visit Diagnosis: Acute pain of right shoulder  Stiffness of right shoulder, not elsewhere classified  Muscle weakness (generalized)  Localized edema     Problem List Patient Active Problem List   Diagnosis Date Noted  . S/P arthroscopy of right shoulder for debridement of calcific tendonitis and open rotator cuff repair  11/06/19 11/11/2019  . Nontraumatic incomplete tear of right rotator cuff   . Myalgia due to statin 12/23/2018  . Pain in joint of left shoulder 07/17/2018  . BMI 30.0-30.9,adult 07/04/2018  . Low HDL (under 40) 08/01/2017  . Hyperlipidemia with target LDL less than 100 08/18/2015  . Internal hemorrhoids 08/18/2014  . Esophageal reflux 08/18/2014    Gabriela Eves, PT, DPT 11/27/2019, 9:52 AM  [facility] [address] [place], [place],  [address] Phone: [phone]   Fax:  [phone]  Name: Laurie Kelley MRN: [id number] Date of Birth: 06-Apr-1971

## 2019-12-02 ENCOUNTER — Other Ambulatory Visit: Payer: Self-pay

## 2019-12-02 ENCOUNTER — Ambulatory Visit: Payer: Commercial Managed Care - PPO | Admitting: Physical Therapy

## 2019-12-02 ENCOUNTER — Encounter: Payer: Self-pay | Admitting: Physical Therapy

## 2019-12-02 DIAGNOSIS — M25511 Pain in right shoulder: Secondary | ICD-10-CM | POA: Diagnosis not present

## 2019-12-02 DIAGNOSIS — M6281 Muscle weakness (generalized): Secondary | ICD-10-CM

## 2019-12-02 DIAGNOSIS — R600 Localized edema: Secondary | ICD-10-CM

## 2019-12-02 DIAGNOSIS — M25611 Stiffness of right shoulder, not elsewhere classified: Secondary | ICD-10-CM

## 2019-12-02 NOTE — Therapy (Signed)
[facility] [place], [place], [address] Phone: [phone]   Fax:  [phone]  Physical Therapy Treatment  Patient Details  Name: Laurie Kelley MRN: [id number] Date of Birth: 05/31/1971 Referring Provider (PT): Jorene Guest MD   Encounter Date: 12/02/2019   PT End of Session - 12/02/19 0912    Visit Number 5    Number of Visits 16    Date for PT Re-Evaluation 01/16/20    PT Start Time 0815    PT Stop Time 0903    PT Time Calculation (min) 48 min    Activity Tolerance Patient tolerated treatment well    Behavior During Therapy [facility] for tasks assessed/performed           Past Medical History:  Diagnosis Date  . Chest pain in adult 07/31/2017  . Esophageal reflux 08/18/2014  . Gallstones   . Hyperlipidemia with target LDL less than 100 08/18/2015  . Internal hemorrhoids 08/18/2014  . PONV (postoperative nausea and vomiting)     Past Surgical History:  Procedure Laterality Date  . CHOLECYSTECTOMY    . SHOULDER ARTHROSCOPY Right 11/06/2019   Procedure: ARTHROSCOPY RIGHT SHOULDER, DEBRIDEMENT X TWO STRUCTURES; OPEN ROTATOR CUFF REPAIR;  Surgeon: Carole Civil, MD;  Location: [facility];  Service: Orthopedics;  Laterality: Right;    There were no vitals filed for this visit.   Subjective Assessment - 12/02/19 0911    Subjective COVID-19 screen performed prior to patient entering clinic. Reports more discomfort over the weekend.    Pertinent History gall bladder removal, high cholesterol    Limitations House hold activities    Currently in Pain? Yes    Pain Score 4     Pain Location Shoulder    Pain Orientation Right    Pain Descriptors / Indicators Aching;Sore    Pain Type Surgical pain    Pain Onset 1 to 4 weeks ago    Pain Frequency Constant              [facility] PT Assessment - 12/02/19 0001      Assessment   Medical Diagnosis R shoulder debridement and rotator cuff repair    Referring Provider  (PT) Jorene Guest MD    Onset Date/Surgical Date 11/06/19    Hand Dominance Right    Next MD Visit 12/05/2019    Prior Therapy yes for left shoulder last year      Precautions   Precaution Comments no abduction or active flexion, sling off on 12/04/2019                         [facility] Adult PT Treatment/Exercise - 12/02/19 0001      Modalities   Modalities Vasopneumatic      Vasopneumatic   Number Minutes Vasopneumatic  10 minutes    Vasopnuematic Location  Shoulder    Vasopneumatic Pressure Low    Vasopneumatic Temperature  34      Manual Therapy   Manual Therapy Passive ROM    Passive ROM PROM to right shoulder into flexion and ER. frequent oscillations to decrease pain and muscle guarding                       PT Long Term Goals - 11/18/19 1142      PT LONG TERM GOAL #1   Title Patient will be independent with HEP and progression.    Time 8    Period  Weeks    Status New      PT LONG TERM GOAL #2   Title Patient will report ability to perform basic ADL's with pain </= 3/10 in R shoulder.    Time 8    Period Weeks    Status New      PT LONG TERM GOAL #3   Title Patient will improve her R shoulder flexion to >/= 140 degrees in order to improve ability for  functional tasks.    Baseline see flowsheets    Time 8    Period Weeks    Status New      PT LONG TERM GOAL #4   Title Pt will improve ER to >/= 45 degrees in order to improve functional mobility.                 Plan - 12/02/19 0912    Clinical Impression Statement Patinet responded fairly to therapy session with ongoing pain. Increased muscle guarding with right shoulder PROM ER, less guarding into PROM flexion. Frequent oscillations and gentle shoulder distraction performed to decrease pain and muscle guarding. No adverse affects upon removal of modalities.    Examination-Activity Limitations Bathing;Carry;Dressing;Lift;Reach Overhead;Toileting     Examination-Participation Restrictions Other;Yard Work;Community Activity;Cleaning    Stability/Clinical Decision Making Stable/Uncomplicated    Clinical Decision Making Low    Rehab Potential Excellent    PT Frequency 2x / week    PT Duration 8 weeks    PT Treatment/Interventions ADLs/Self Care Home Management;Cryotherapy;Electrical Stimulation;Moist Heat;Ultrasound;Therapeutic activities;Therapeutic exercise;Neuromuscular re-education;Patient/family education;Manual techniques;Passive range of motion;Taping;Vasopneumatic Device    PT Next Visit Plan MD note; No active flexion, no abduction       PROM, pendulums, table slides (fleixon/ scaption), modaliites as needed for pain, Vaso    PT Home Exercise Plan pendulums, table slides    Consulted and Agree with Plan of Care Patient           Patient will benefit from skilled therapeutic intervention in order to improve the following deficits and impairments:  Pain, Postural dysfunction, Decreased strength, Decreased range of motion, Impaired UE functional use, Increased edema  Visit Diagnosis: Acute pain of right shoulder  Stiffness of right shoulder, not elsewhere classified  Muscle weakness (generalized)  Localized edema     Problem List Patient Active Problem List   Diagnosis Date Noted  . S/P arthroscopy of right shoulder for debridement of calcific tendonitis and open rotator cuff repair  11/06/19 11/11/2019  . Nontraumatic incomplete tear of right rotator cuff   . Myalgia due to statin 12/23/2018  . Pain in joint of left shoulder 07/17/2018  . BMI 30.0-30.9,adult 07/04/2018  . Low HDL (under 40) 08/01/2017  . Hyperlipidemia with target LDL less than 100 08/18/2015  . Internal hemorrhoids 08/18/2014  . Esophageal reflux 08/18/2014    Gabriela Eves, PT, DPT 12/02/2019, 9:24 AM  [facility] [address] [place], [place], [address] Phone: [phone]   Fax:   [phone]  Name: Laurie Kelley MRN: [id number] Date of Birth: [birth year]

## 2019-12-04 ENCOUNTER — Encounter: Payer: Self-pay | Admitting: Physical Therapy

## 2019-12-04 ENCOUNTER — Other Ambulatory Visit: Payer: Self-pay

## 2019-12-04 ENCOUNTER — Ambulatory Visit: Payer: Commercial Managed Care - PPO | Admitting: Physical Therapy

## 2019-12-04 DIAGNOSIS — R600 Localized edema: Secondary | ICD-10-CM

## 2019-12-04 DIAGNOSIS — M6281 Muscle weakness (generalized): Secondary | ICD-10-CM

## 2019-12-04 DIAGNOSIS — M25511 Pain in right shoulder: Secondary | ICD-10-CM | POA: Diagnosis not present

## 2019-12-04 DIAGNOSIS — M25611 Stiffness of right shoulder, not elsewhere classified: Secondary | ICD-10-CM

## 2019-12-04 NOTE — Therapy (Signed)
[facility] [place], [place], [address] Phone: [phone]   Fax:  [phone]  Physical Therapy Treatment  Patient Details  Name: Laurie Kelley MRN: [id number] Date of Birth: 03-04-1971 Referring Provider (PT): Jorene Guest MD   Encounter Date: 12/04/2019   PT End of Session - 12/04/19 0818    Visit Number 6    Number of Visits 16    Date for PT Re-Evaluation 01/16/20    PT Start Time 0818    PT Stop Time 0900    PT Time Calculation (min) 42 min    Activity Tolerance Patient tolerated treatment well    Behavior During Therapy [facility] for tasks assessed/performed           Past Medical History:  Diagnosis Date  . Chest pain in adult 07/31/2017  . Esophageal reflux 08/18/2014  . Gallstones   . Hyperlipidemia with target LDL less than 100 08/18/2015  . Internal hemorrhoids 08/18/2014  . PONV (postoperative nausea and vomiting)     Past Surgical History:  Procedure Laterality Date  . CHOLECYSTECTOMY    . SHOULDER ARTHROSCOPY Right 11/06/2019   Procedure: ARTHROSCOPY RIGHT SHOULDER, DEBRIDEMENT X TWO STRUCTURES; OPEN ROTATOR CUFF REPAIR;  Surgeon: Carole Civil, MD;  Location: [facility];  Service: Orthopedics;  Laterality: Right;    There were no vitals filed for this visit.   Subjective Assessment - 12/04/19 0817    Subjective COVID-19 screen performed prior to patient entering clinic. Reports discomfort upon arrival.    Pertinent History gall bladder removal, high cholesterol    Limitations House hold activities    Currently in Pain? Yes    Pain Score 5     Pain Location Shoulder    Pain Orientation Right;Anterior;Proximal    Pain Descriptors / Indicators Throbbing    Pain Type Surgical pain    Pain Onset 1 to 4 weeks ago    Pain Frequency Constant              [facility] PT Assessment - 12/04/19 0001      Assessment   Medical Diagnosis R shoulder debridement and rotator cuff repair    Referring  Provider (PT) Jorene Guest MD    Onset Date/Surgical Date 11/06/19    Hand Dominance Right    Next MD Visit 12/05/2019    Prior Therapy yes for left shoulder last year      Precautions   Precaution Comments no abduction or active flexion, sling off on 12/04/2019      ROM / Strength   AROM / PROM / Strength PROM      PROM   Overall PROM  Deficits    PROM Assessment Site Shoulder    Right/Left Shoulder Right    Right Shoulder Flexion 125 Degrees   supine   Right Shoulder Internal Rotation 65 Degrees    Right Shoulder External Rotation 26 Degrees   limited by shoulder pain                        [facility] Adult PT Treatment/Exercise - 12/04/19 0001      Modalities   Modalities Vasopneumatic      Vasopneumatic   Number Minutes Vasopneumatic  15 minutes    Vasopnuematic Location  Shoulder    Vasopneumatic Pressure Low    Vasopneumatic Temperature  34      Manual Therapy   Manual Therapy Passive ROM    Passive ROM PROM of R  shoulder into flexion, ER, IR with holds at end range and oscillations intermittantly                       PT Long Term Goals - 11/18/19 1142      PT LONG TERM GOAL #1   Title Patient will be independent with HEP and progression.    Time 8    Period Weeks    Status New      PT LONG TERM GOAL #2   Title Patient will report ability to perform basic ADL's with pain </= 3/10 in R shoulder.    Time 8    Period Weeks    Status New      PT LONG TERM GOAL #3   Title Patient will improve her R shoulder flexion to >/= 140 degrees in order to improve ability for  functional tasks.    Baseline see flowsheets    Time 8    Period Weeks    Status New      PT LONG TERM GOAL #4   Title Pt will improve ER to >/= 45 degrees in order to improve functional mobility.                 Plan - 12/04/19 0856    Clinical Impression Statement Patient presented in clinic with 5/10 R shoulder pain. Steristrips still in place over  incisions and sling was donned upon arrival. Firm end feels and smooth arc of motion noted during PROM of R shoulder in all directions assessed. Patient has a self reported "bad" R elbow which may somwhat limit R shoulder ER. Intermittant oscillations provided throughout PROM session to reduce muscle guarding and pain. Normal modalities response noted following removal of the modalities.    Examination-Activity Limitations Bathing;Carry;Dressing;Lift;Reach Overhead;Toileting    Examination-Participation Restrictions Other;Yard Work;Community Activity;Cleaning    Stability/Clinical Decision Making Stable/Uncomplicated    Rehab Potential Excellent    PT Frequency 2x / week    PT Duration 8 weeks    PT Treatment/Interventions ADLs/Self Care Home Management;Cryotherapy;Electrical Stimulation;Moist Heat;Ultrasound;Therapeutic activities;Therapeutic exercise;Neuromuscular re-education;Patient/family education;Manual techniques;Passive range of motion;Taping;Vasopneumatic Device    PT Next Visit Plan Continue at MD discretion.    PT Home Exercise Plan pendulums, table slides    Consulted and Agree with Plan of Care Patient           Patient will benefit from skilled therapeutic intervention in order to improve the following deficits and impairments:  Pain, Postural dysfunction, Decreased strength, Decreased range of motion, Impaired UE functional use, Increased edema  Visit Diagnosis: Acute pain of right shoulder  Stiffness of right shoulder, not elsewhere classified  Muscle weakness (generalized)  Localized edema     Problem List Patient Active Problem List   Diagnosis Date Noted  . S/P arthroscopy of right shoulder for debridement of calcific tendonitis and open rotator cuff repair  11/06/19 11/11/2019  . Nontraumatic incomplete tear of right rotator cuff   . Myalgia due to statin 12/23/2018  . Pain in joint of left shoulder 07/17/2018  . BMI 30.0-30.9,adult 07/04/2018  . Low HDL  (under 40) 08/01/2017  . Hyperlipidemia with target LDL less than 100 08/18/2015  . Internal hemorrhoids 08/18/2014  . Esophageal reflux 08/18/2014    Standley Brooking, PTA 12/04/19 9:05 AM   [facility] [address] [place], [place], [address] Phone: [phone]   Fax:  [phone]  Name: LAURIN PAULO MRN: [id number] Date of Birth: Aug 03, 1970

## 2019-12-05 ENCOUNTER — Ambulatory Visit (INDEPENDENT_AMBULATORY_CARE_PROVIDER_SITE_OTHER): Payer: Commercial Managed Care - PPO | Admitting: Orthopedic Surgery

## 2019-12-05 DIAGNOSIS — Z9889 Other specified postprocedural states: Secondary | ICD-10-CM

## 2019-12-05 DIAGNOSIS — M7531 Calcific tendinitis of right shoulder: Secondary | ICD-10-CM

## 2019-12-05 MED ORDER — HYDROCODONE-ACETAMINOPHEN 5-325 MG PO TABS: 1.0000 | ORAL_TABLET | Freq: Four times a day (QID) | ORAL | 0 refills | Status: DC | PRN

## 2019-12-05 NOTE — Progress Notes (Signed)
Chief Complaint  Patient presents with  . Follow-up    Recheck on right shoulder, DOS 11-06-19.    50 year old female status post rotator cuff repair 4 weeks ago  She is taking her pain medicine only at night but is not controlling the pain with the Tylenol with codeine  We removed to absorbable sutures 1 from the anterior wound 1 from the posterior wound  The shoulder looks good there is no significant swelling neurovascular exam is intact she is progressing well in therapy  Switch back to hydrocodone for pain continue therapy follow-up in 4 weeks sling can be removed use only for comfort

## 2019-12-05 NOTE — Patient Instructions (Signed)
Sling off use for soreness and if it feeks heavy   Continue therapy   Return in 4 weeks

## 2019-12-09 ENCOUNTER — Ambulatory Visit: Payer: Commercial Managed Care - PPO | Admitting: Physical Therapy

## 2019-12-09 ENCOUNTER — Encounter: Payer: Self-pay | Admitting: Physical Therapy

## 2019-12-09 ENCOUNTER — Other Ambulatory Visit: Payer: Self-pay

## 2019-12-09 DIAGNOSIS — M25511 Pain in right shoulder: Secondary | ICD-10-CM

## 2019-12-09 DIAGNOSIS — R600 Localized edema: Secondary | ICD-10-CM

## 2019-12-09 DIAGNOSIS — M6281 Muscle weakness (generalized): Secondary | ICD-10-CM

## 2019-12-09 DIAGNOSIS — M25611 Stiffness of right shoulder, not elsewhere classified: Secondary | ICD-10-CM

## 2019-12-09 NOTE — Therapy (Signed)
[facility] [place], [place], [address] Phone: [phone]   Fax:  [phone]  Physical Therapy Treatment  Patient Details  Name: Laurie Kelley MRN: [id number] Date of Birth: 08-16-1970 Referring Provider (PT): Jorene Guest MD   Encounter Date: 12/09/2019   PT End of Session - 12/09/19 0820    Visit Number 7    Number of Visits 16    Date for PT Re-Evaluation 01/16/20    PT Start Time 0820    PT Stop Time 9935    PT Time Calculation (min) 34 min    Activity Tolerance Patient tolerated treatment well    Behavior During Therapy [facility] for tasks assessed/performed           Past Medical History:  Diagnosis Date  . Chest pain in adult 07/31/2017  . Esophageal reflux 08/18/2014  . Gallstones   . Hyperlipidemia with target LDL less than 100 08/18/2015  . Internal hemorrhoids 08/18/2014  . PONV (postoperative nausea and vomiting)     Past Surgical History:  Procedure Laterality Date  . CHOLECYSTECTOMY    . SHOULDER ARTHROSCOPY Right 11/06/2019   Procedure: ARTHROSCOPY RIGHT SHOULDER, DEBRIDEMENT X TWO STRUCTURES; OPEN ROTATOR CUFF REPAIR;  Surgeon: Carole Civil, MD;  Location: [facility];  Service: Orthopedics;  Laterality: Right;    There were no vitals filed for this visit.   Subjective Assessment - 12/09/19 0819    Subjective COVID-19 screen performed prior to patient entering clinic. Reports some discomfort after MD removing sterstrips and sutures. Reports that her shoulder is really sore after not using sling.    Pertinent History gall bladder removal, high cholesterol    Limitations House hold activities    Currently in Pain? Yes    Pain Score 3     Pain Location Shoulder    Pain Orientation Right    Pain Descriptors / Indicators Discomfort;Sore    Pain Type Surgical pain    Pain Onset 1 to 4 weeks ago    Pain Frequency Constant              [facility] PT Assessment - 12/09/19 0001      Assessment    Medical Diagnosis R shoulder debridement and rotator cuff repair    Referring Provider (PT) Jorene Guest MD    Onset Date/Surgical Date 11/06/19    Hand Dominance Right    Next MD Visit 12/2019    Prior Therapy yes for left shoulder last year      Precautions   Precaution Comments no abduction or active flexion, sling off on 12/04/2019                         [facility] Adult PT Treatment/Exercise - 12/09/19 0001      Modalities   Modalities Vasopneumatic      Vasopneumatic   Number Minutes Vasopneumatic  10 minutes    Vasopnuematic Location  Shoulder    Vasopneumatic Pressure Low    Vasopneumatic Temperature  34      Manual Therapy   Manual Therapy Passive ROM    Passive ROM PROM of R shoulder into flexion, ER, IR with holds at end range and oscillations intermittantly                       PT Long Term Goals - 11/18/19 1142      PT LONG TERM GOAL #1   Title Patient will be  independent with HEP and progression.    Time 8    Period Weeks    Status New      PT LONG TERM GOAL #2   Title Patient will report ability to perform basic ADL's with pain </= 3/10 in R shoulder.    Time 8    Period Weeks    Status New      PT LONG TERM GOAL #3   Title Patient will improve her R shoulder flexion to >/= 140 degrees in order to improve ability for  functional tasks.    Baseline see flowsheets    Time 8    Period Weeks    Status New      PT LONG TERM GOAL #4   Title Pt will improve ER to >/= 45 degrees in order to improve functional mobility.                 Plan - 12/09/19 0856    Clinical Impression Statement Patient presented in clinic with reports of discomfort since MD visit last week when steristrips and sutures were removed. Patient has not used sling since 12/05/2019 per patient report. Firm end feels and smooth arc of motion noted during PROM of R shoulder in all directions. PROM of R shoulder ER still limited at this time. Normal  vasopneumatic response noted following removal of the modality. Patient encouraged to continue provided HEP at this time and progression will occur per protocol.    Examination-Activity Limitations Bathing;Carry;Dressing;Lift;Reach Overhead;Toileting    Examination-Participation Restrictions Other;Yard Work;Community Activity;Cleaning    Stability/Clinical Decision Making Stable/Uncomplicated    Rehab Potential Excellent    PT Frequency 2x / week    PT Duration 8 weeks    PT Treatment/Interventions ADLs/Self Care Home Management;Cryotherapy;Electrical Stimulation;Moist Heat;Ultrasound;Therapeutic activities;Therapeutic exercise;Neuromuscular re-education;Patient/family education;Manual techniques;Passive range of motion;Taping;Vasopneumatic Device    PT Next Visit Plan Continue at MD discretion.    PT Home Exercise Plan pendulums, table slides    Consulted and Agree with Plan of Care Patient           Patient will benefit from skilled therapeutic intervention in order to improve the following deficits and impairments:  Pain, Postural dysfunction, Decreased strength, Decreased range of motion, Impaired UE functional use, Increased edema  Visit Diagnosis: Acute pain of right shoulder  Stiffness of right shoulder, not elsewhere classified  Muscle weakness (generalized)  Localized edema     Problem List Patient Active Problem List   Diagnosis Date Noted  . S/P arthroscopy of right shoulder for debridement of calcific tendonitis and open rotator cuff repair  11/06/19 11/11/2019  . Nontraumatic incomplete tear of right rotator cuff   . Myalgia due to statin 12/23/2018  . Pain in joint of left shoulder 07/17/2018  . BMI 30.0-30.9,adult 07/04/2018  . Low HDL (under 40) 08/01/2017  . Hyperlipidemia with target LDL less than 100 08/18/2015  . Internal hemorrhoids 08/18/2014  . Esophageal reflux 08/18/2014    Standley Brooking, PTA 12/09/2019, 9:10 AM  [facility] [place], [place], [address] Phone: [phone]   Fax:  [phone]  Name: CARSEN MACHI MRN: [id number] Date of Birth: [birth year]

## 2019-12-11 ENCOUNTER — Ambulatory Visit: Payer: Commercial Managed Care - PPO | Admitting: Physical Therapy

## 2019-12-11 ENCOUNTER — Other Ambulatory Visit: Payer: Self-pay

## 2019-12-11 ENCOUNTER — Encounter: Payer: Self-pay | Admitting: Physical Therapy

## 2019-12-11 DIAGNOSIS — M25511 Pain in right shoulder: Secondary | ICD-10-CM | POA: Diagnosis not present

## 2019-12-11 DIAGNOSIS — M6281 Muscle weakness (generalized): Secondary | ICD-10-CM

## 2019-12-11 DIAGNOSIS — M25611 Stiffness of right shoulder, not elsewhere classified: Secondary | ICD-10-CM

## 2019-12-11 DIAGNOSIS — R600 Localized edema: Secondary | ICD-10-CM

## 2019-12-11 NOTE — Therapy (Signed)
[facility] [place], [place], [address] Phone: [phone]   Fax:  [phone]  Physical Therapy Treatment  Patient Details  Name: Laurie Kelley MRN: [id number] Date of Birth: September 05, 1970 Referring Provider (PT): Jorene Guest MD   Encounter Date: 12/11/2019   PT End of Session - 12/11/19 0852    Visit Number 8    Number of Visits 16    Date for PT Re-Evaluation 01/16/20    PT Start Time 0815    PT Stop Time 0902    PT Time Calculation (min) 47 min    Activity Tolerance Patient limited by pain    Behavior During Therapy [facility] for tasks assessed/performed           Past Medical History:  Diagnosis Date  . Chest pain in adult 07/31/2017  . Esophageal reflux 08/18/2014  . Gallstones   . Hyperlipidemia with target LDL less than 100 08/18/2015  . Internal hemorrhoids 08/18/2014  . PONV (postoperative nausea and vomiting)     Past Surgical History:  Procedure Laterality Date  . CHOLECYSTECTOMY    . SHOULDER ARTHROSCOPY Right 11/06/2019   Procedure: ARTHROSCOPY RIGHT SHOULDER, DEBRIDEMENT X TWO STRUCTURES; OPEN ROTATOR CUFF REPAIR;  Surgeon: Carole Civil, MD;  Location: [facility];  Service: Orthopedics;  Laterality: Right;    There were no vitals filed for this visit.   Subjective Assessment - 12/11/19 0815    Subjective COVID-19 screen performed prior to patient entering clinic. Reports more soreness since being out of the sling.    Pertinent History gall bladder removal, high cholesterol    Limitations House hold activities    Currently in Pain? Yes    Pain Score 4     Pain Location Shoulder    Pain Orientation Right    Pain Descriptors / Indicators Discomfort;Sore    Pain Type Surgical pain    Pain Onset 1 to 4 weeks ago    Pain Frequency Constant              [facility] PT Assessment - 12/11/19 0001      Assessment   Medical Diagnosis R shoulder debridement and rotator cuff repair    Referring  Provider (PT) Jorene Guest MD    Onset Date/Surgical Date 11/06/19    Hand Dominance Right    Next MD Visit 12/2019    Prior Therapy yes for left shoulder last year      Precautions   Precaution Comments no abduction or active flexion, sling off on 12/04/2019                         [facility] Adult PT Treatment/Exercise - 12/11/19 0001      Modalities   Modalities Vasopneumatic      Vasopneumatic   Number Minutes Vasopneumatic  15 minutes    Vasopnuematic Location  Shoulder    Vasopneumatic Pressure Low    Vasopneumatic Temperature  34      Manual Therapy   Manual Therapy Passive ROM    Passive ROM PROM to R shoulder into flexion, ER, IR with gentle holds at end range and oscillations frequently to decrease muscle guarding                       PT Long Term Goals - 11/18/19 1142      PT LONG TERM GOAL #1   Title Patient will be independent with HEP and progression.  Time 8    Period Weeks    Status New      PT LONG TERM GOAL #2   Title Patient will report ability to perform basic ADL's with pain </= 3/10 in R shoulder.    Time 8    Period Weeks    Status New      PT LONG TERM GOAL #3   Title Patient will improve her R shoulder flexion to >/= 140 degrees in order to improve ability for  functional tasks.    Baseline see flowsheets    Time 8    Period Weeks    Status New      PT LONG TERM GOAL #4   Title Pt will improve ER to >/= 45 degrees in order to improve functional mobility.                 Plan - 12/11/19 0857    Clinical Impression Statement Patient was able to complete treatment but with pain and reports of tightness with R shoulder PROM. Frequent oscillations performed to decrease muscle guarding. Ongoing limitations into ER with more guarding in this direction than in flexion or IR. Incisions were assessed with good healing one scab on the anterior port incision. Mini open and posterior incisions are healed well with  no scabbing. No adverse affects upon removal of modalities.    Examination-Activity Limitations Bathing;Carry;Dressing;Lift;Reach Overhead;Toileting    Examination-Participation Restrictions Other;Yard Work;Community Activity;Cleaning    Stability/Clinical Decision Making Stable/Uncomplicated    Clinical Decision Making Low    Rehab Potential Excellent    PT Frequency 2x / week    PT Duration 8 weeks    PT Treatment/Interventions ADLs/Self Care Home Management;Cryotherapy;Electrical Stimulation;Moist Heat;Ultrasound;Therapeutic activities;Therapeutic exercise;Neuromuscular re-education;Patient/family education;Manual techniques;Passive range of motion;Taping;Vasopneumatic Device    PT Next Visit Plan Continue at MD discretion.    PT Home Exercise Plan pendulums, table slides    Consulted and Agree with Plan of Care Patient           Patient will benefit from skilled therapeutic intervention in order to improve the following deficits and impairments:  Pain, Postural dysfunction, Decreased strength, Decreased range of motion, Impaired UE functional use, Increased edema  Visit Diagnosis: Acute pain of right shoulder  Stiffness of right shoulder, not elsewhere classified  Muscle weakness (generalized)  Localized edema     Problem List Patient Active Problem List   Diagnosis Date Noted  . S/P arthroscopy of right shoulder for debridement of calcific tendonitis and open rotator cuff repair  11/06/19 11/11/2019  . Nontraumatic incomplete tear of right rotator cuff   . Myalgia due to statin 12/23/2018  . Pain in joint of left shoulder 07/17/2018  . BMI 30.0-30.9,adult 07/04/2018  . Low HDL (under 40) 08/01/2017  . Hyperlipidemia with target LDL less than 100 08/18/2015  . Internal hemorrhoids 08/18/2014  . Esophageal reflux 08/18/2014    Gabriela Eves, PT, DPT 12/11/2019, 9:20 AM  [facility] [address] [place], [place],  [address] Phone: [phone]   Fax:  [phone]  Name: AZAYLIA FONG MRN: [id number] Date of Birth: August 25, 1970

## 2019-12-16 ENCOUNTER — Ambulatory Visit: Payer: Commercial Managed Care - PPO | Admitting: Physical Therapy

## 2019-12-16 ENCOUNTER — Encounter: Payer: Self-pay | Admitting: Physical Therapy

## 2019-12-16 ENCOUNTER — Other Ambulatory Visit: Payer: Self-pay

## 2019-12-16 DIAGNOSIS — R600 Localized edema: Secondary | ICD-10-CM

## 2019-12-16 DIAGNOSIS — M25611 Stiffness of right shoulder, not elsewhere classified: Secondary | ICD-10-CM

## 2019-12-16 DIAGNOSIS — M25511 Pain in right shoulder: Secondary | ICD-10-CM | POA: Diagnosis not present

## 2019-12-16 DIAGNOSIS — M6281 Muscle weakness (generalized): Secondary | ICD-10-CM

## 2019-12-16 NOTE — Therapy (Signed)
[facility] [place], [place], [address] Phone: [phone]   Fax:  [phone]  Physical Therapy Treatment  Patient Details  Name: Laurie Kelley MRN: [id number] Date of Birth: [birth year] Referring Provider (PT): Jorene Guest MD   Encounter Date: 12/16/2019   PT End of Session - 12/16/19 0818    Visit Number 9    Number of Visits 16    Date for PT Re-Evaluation 01/16/20    PT Start Time 0818    PT Stop Time 0858    PT Time Calculation (min) 40 min    Activity Tolerance Patient tolerated treatment well    Behavior During Therapy [facility] for tasks assessed/performed           Past Medical History:  Diagnosis Date  . Chest pain in adult 07/31/2017  . Esophageal reflux 08/18/2014  . Gallstones   . Hyperlipidemia with target LDL less than 100 08/18/2015  . Internal hemorrhoids 08/18/2014  . PONV (postoperative nausea and vomiting)     Past Surgical History:  Procedure Laterality Date  . CHOLECYSTECTOMY    . SHOULDER ARTHROSCOPY Right 11/06/2019   Procedure: ARTHROSCOPY RIGHT SHOULDER, DEBRIDEMENT X TWO STRUCTURES; OPEN ROTATOR CUFF REPAIR;  Surgeon: Carole Civil, MD;  Location: [facility];  Service: Orthopedics;  Laterality: Right;    There were no vitals filed for this visit.   Subjective Assessment - 12/16/19 0818    Subjective COVID-19 screen performed prior to patient entering clinic. Reports that she had a good day yesterday.    Pertinent History gall bladder removal, high cholesterol    Limitations House hold activities    Currently in Pain? No/denies              [facility] PT Assessment - 12/16/19 0001      Assessment   Medical Diagnosis R shoulder debridement and rotator cuff repair    Referring Provider (PT) Jorene Guest MD    Onset Date/Surgical Date 11/06/19    Hand Dominance Right    Next MD Visit 12/2019    Prior Therapy yes for left shoulder last year      Precautions   Precaution  Comments no abduction or active flexion, sling off on 12/04/2019                         [facility] Adult PT Treatment/Exercise - 12/16/19 0001      Modalities   Modalities Vasopneumatic      Vasopneumatic   Number Minutes Vasopneumatic  15 minutes    Vasopnuematic Location  Shoulder    Vasopneumatic Pressure Low    Vasopneumatic Temperature  34      Manual Therapy   Manual Therapy Passive ROM;Soft tissue mobilization    Soft tissue mobilization STW to R posterior shoulder to reduce discomfort from PROM    Passive ROM PROM to R shoulder into flexion, ER, IR with gentle holds at end range and oscillations frequently to decrease muscle guarding                       PT Long Term Goals - 11/18/19 1142      PT LONG TERM GOAL #1   Title Patient will be independent with HEP and progression.    Time 8    Period Weeks    Status New      PT LONG TERM GOAL #2   Title Patient will report ability to perform  basic ADL's with pain </= 3/10 in R shoulder.    Time 8    Period Weeks    Status New      PT LONG TERM GOAL #3   Title Patient will improve her R shoulder flexion to >/= 140 degrees in order to improve ability for  functional tasks.    Baseline see flowsheets    Time 8    Period Weeks    Status New      PT LONG TERM GOAL #4   Title Pt will improve ER to >/= 45 degrees in order to improve functional mobility.                 Plan - 12/16/19 0900    Clinical Impression Statement Patient presented in clinic with no L shoulder pain or soreness. Patient did report some posterior shoulder discomfort at end range PROM flexion. Patient also tender with STW to R posterior shoulder. Firm end feels and smooth arc of motion noted during PROM of R shoulder in all directions. Improvement of avaliable R shoulder PROM into ER noted today. Normal vasopnuematic response noted following removal of the modality.    Examination-Activity Limitations  Bathing;Carry;Dressing;Lift;Reach Overhead;Toileting    Examination-Participation Restrictions Other;Yard Work;Community Activity;Cleaning    Stability/Clinical Decision Making Stable/Uncomplicated    Rehab Potential Excellent    PT Frequency 2x / week    PT Duration 8 weeks    PT Treatment/Interventions ADLs/Self Care Home Management;Cryotherapy;Electrical Stimulation;Moist Heat;Ultrasound;Therapeutic activities;Therapeutic exercise;Neuromuscular re-education;Patient/family education;Manual techniques;Passive range of motion;Taping;Vasopneumatic Device    PT Next Visit Plan Continue at MD discretion.    PT Home Exercise Plan pendulums, table slides    Consulted and Agree with Plan of Care Patient           Patient will benefit from skilled therapeutic intervention in order to improve the following deficits and impairments:  Pain, Postural dysfunction, Decreased strength, Decreased range of motion, Impaired UE functional use, Increased edema  Visit Diagnosis: Acute pain of right shoulder  Stiffness of right shoulder, not elsewhere classified  Muscle weakness (generalized)  Localized edema     Problem List Patient Active Problem List   Diagnosis Date Noted  . S/P arthroscopy of right shoulder for debridement of calcific tendonitis and open rotator cuff repair  11/06/19 11/11/2019  . Nontraumatic incomplete tear of right rotator cuff   . Myalgia due to statin 12/23/2018  . Pain in joint of left shoulder 07/17/2018  . BMI 30.0-30.9,adult 07/04/2018  . Low HDL (under 40) 08/01/2017  . Hyperlipidemia with target LDL less than 100 08/18/2015  . Internal hemorrhoids 08/18/2014  . Esophageal reflux 08/18/2014    Standley Brooking, PTA 12/16/2019, 9:10 AM  [facility] [place], [place], [address] Phone: [phone]   Fax:  [phone]  Name: Laurie Kelley MRN: [id number] Date of Birth: 1970-10-11

## 2019-12-18 ENCOUNTER — Ambulatory Visit: Payer: Commercial Managed Care - PPO | Attending: Orthopedic Surgery | Admitting: Physical Therapy

## 2019-12-18 ENCOUNTER — Encounter: Payer: Self-pay | Admitting: Physical Therapy

## 2019-12-18 ENCOUNTER — Other Ambulatory Visit: Payer: Self-pay

## 2019-12-18 DIAGNOSIS — M25511 Pain in right shoulder: Secondary | ICD-10-CM | POA: Diagnosis not present

## 2019-12-18 DIAGNOSIS — M25611 Stiffness of right shoulder, not elsewhere classified: Secondary | ICD-10-CM | POA: Diagnosis present

## 2019-12-18 DIAGNOSIS — M25512 Pain in left shoulder: Secondary | ICD-10-CM | POA: Insufficient documentation

## 2019-12-18 DIAGNOSIS — M6281 Muscle weakness (generalized): Secondary | ICD-10-CM | POA: Insufficient documentation

## 2019-12-18 DIAGNOSIS — R600 Localized edema: Secondary | ICD-10-CM | POA: Diagnosis present

## 2019-12-18 NOTE — Therapy (Signed)
[facility] [place], [place], [address] Phone: [phone]   Fax:  [phone]  Physical Therapy Treatment Progress Note Reporting Period 11/18/2019 to 12/18/2019  See note below for Objective Data and Assessment of Progress/Goals. Patient progressing slowly but limited due to pain and protocol. Gabriela Eves, PT, DPT     Patient Details  Name: Laurie Kelley MRN: [id number] Date of Birth: [birth year] Referring Provider (PT): Jorene Guest MD   Encounter Date: 12/18/2019   PT End of Session - 12/18/19 0816    Visit Number 10    Number of Visits 16    Date for PT Re-Evaluation 01/16/20    PT Start Time 0816    PT Stop Time 0858    PT Time Calculation (min) 42 min    Activity Tolerance Patient tolerated treatment well    Behavior During Therapy [facility] for tasks assessed/performed           Past Medical History:  Diagnosis Date  . Chest pain in adult 07/31/2017  . Esophageal reflux 08/18/2014  . Gallstones   . Hyperlipidemia with target LDL less than 100 08/18/2015  . Internal hemorrhoids 08/18/2014  . PONV (postoperative nausea and vomiting)     Past Surgical History:  Procedure Laterality Date  . CHOLECYSTECTOMY    . SHOULDER ARTHROSCOPY Right 11/06/2019   Procedure: ARTHROSCOPY RIGHT SHOULDER, DEBRIDEMENT X TWO STRUCTURES; OPEN ROTATOR CUFF REPAIR;  Surgeon: Carole Civil, MD;  Location: [facility];  Service: Orthopedics;  Laterality: Right;    There were no vitals filed for this visit.   Subjective Assessment - 12/18/19 0816    Subjective COVID-19 screen performed prior to patient entering clinic. Reports that she had a good day yesterday.    Pertinent History gall bladder removal, high cholesterol    Limitations House hold activities    Currently in Pain? No/denies              [facility] PT Assessment - 12/18/19 0001      Assessment   Medical Diagnosis R shoulder debridement and rotator cuff repair     Referring Provider (PT) Jorene Guest MD    Onset Date/Surgical Date 11/06/19    Hand Dominance Right    Next MD Visit 12/2019    Prior Therapy yes for left shoulder last year      Precautions   Precaution Comments no abduction or active flexion, sling off on 12/04/2019      Observation/Other Assessments   Focus on Therapeutic Outcomes (FOTO)  51% limitation, CK status                         [facility] Adult PT Treatment/Exercise - 12/18/19 0001      Modalities   Modalities Vasopneumatic      Vasopneumatic   Number Minutes Vasopneumatic  15 minutes    Vasopnuematic Location  Shoulder    Vasopneumatic Pressure Low    Vasopneumatic Temperature  34      Manual Therapy   Manual Therapy Passive ROM;Soft tissue mobilization    Soft tissue mobilization STW to R posterior shoulder to reduce discomfort from PROM    Passive ROM PROM to R shoulder into flexion, ER, IR with gentle holds at end range and oscillations frequently to decrease muscle guarding                       PT Long Term Goals - 11/18/19 1142  PT LONG TERM GOAL #1   Title Patient will be independent with HEP and progression.    Time 8    Period Weeks    Status New      PT LONG TERM GOAL #2   Title Patient will report ability to perform basic ADL's with pain </= 3/10 in R shoulder.    Time 8    Period Weeks    Status New      PT LONG TERM GOAL #3   Title Patient will improve her R shoulder flexion to >/= 140 degrees in order to improve ability for  functional tasks.    Baseline see flowsheets    Time 8    Period Weeks    Status New      PT LONG TERM GOAL #4   Title Pt will improve ER to >/= 45 degrees in order to improve functional mobility.                 Plan - 12/18/19 0909    Clinical Impression Statement Patient presented in clinic with reports of no R shoulder pain upon arrival. Patient did report some deltoid, posterior cuff discomfort during PROM of R  shoulder. Intermittant oscillations and STW completed during treatment in order to control discomfort and muscle guarding. Firm end feels and smooth arc of motion noted during PROM of R shoulder in all directions. More limitation of ER even prior to shoulder injury secondary to previous chronic elbow injury. Normal vasopnuematic response noted following removal of the modality.    Examination-Activity Limitations Bathing;Carry;Dressing;Lift;Reach Overhead;Toileting    Examination-Participation Restrictions Other;Yard Work;Community Activity;Cleaning    Stability/Clinical Decision Making Stable/Uncomplicated    Rehab Potential Excellent    PT Frequency 2x / week    PT Duration 8 weeks    PT Treatment/Interventions ADLs/Self Care Home Management;Cryotherapy;Electrical Stimulation;Moist Heat;Ultrasound;Therapeutic activities;Therapeutic exercise;Neuromuscular re-education;Patient/family education;Manual techniques;Passive range of motion;Taping;Vasopneumatic Device    PT Next Visit Plan Continue at MD discretion.    PT Home Exercise Plan pendulums, table slides    Consulted and Agree with Plan of Care Patient           Patient will benefit from skilled therapeutic intervention in order to improve the following deficits and impairments:  Pain, Postural dysfunction, Decreased strength, Decreased range of motion, Impaired UE functional use, Increased edema  Visit Diagnosis: Acute pain of right shoulder  Stiffness of right shoulder, not elsewhere classified  Muscle weakness (generalized)  Localized edema     Problem List Patient Active Problem List   Diagnosis Date Noted  . S/P arthroscopy of right shoulder for debridement of calcific tendonitis and open rotator cuff repair  11/06/19 11/11/2019  . Nontraumatic incomplete tear of right rotator cuff   . Myalgia due to statin 12/23/2018  . Pain in joint of left shoulder 07/17/2018  . BMI 30.0-30.9,adult 07/04/2018  . Low HDL (under 40)  08/01/2017  . Hyperlipidemia with target LDL less than 100 08/18/2015  . Internal hemorrhoids 08/18/2014  . Esophageal reflux 08/18/2014    Standley Brooking, PTA 12/18/2019, 9:12 AM  [facility] [place], [place], [address] Phone: [phone]   Fax:  [phone]  Name: Laurie Kelley MRN: [id number] Date of Birth: [birth year]

## 2019-12-19 ENCOUNTER — Other Ambulatory Visit: Payer: Self-pay | Admitting: Orthopedic Surgery

## 2019-12-23 ENCOUNTER — Ambulatory Visit: Payer: Commercial Managed Care - PPO | Admitting: *Deleted

## 2019-12-23 ENCOUNTER — Telehealth: Payer: Self-pay | Admitting: Orthopedic Surgery

## 2019-12-23 ENCOUNTER — Other Ambulatory Visit: Payer: Self-pay

## 2019-12-23 ENCOUNTER — Other Ambulatory Visit: Payer: Self-pay | Admitting: Family Medicine

## 2019-12-23 DIAGNOSIS — R600 Localized edema: Secondary | ICD-10-CM

## 2019-12-23 DIAGNOSIS — M25611 Stiffness of right shoulder, not elsewhere classified: Secondary | ICD-10-CM

## 2019-12-23 DIAGNOSIS — M6281 Muscle weakness (generalized): Secondary | ICD-10-CM

## 2019-12-23 DIAGNOSIS — M25511 Pain in right shoulder: Secondary | ICD-10-CM

## 2019-12-23 NOTE — Telephone Encounter (Signed)
Called patient to see if I could answer her question surgery was 11/06/19 she can not lift her arm on her own yet.  I have told her she should not lift anything heavy yet, or push anything heavy yet, but we want her to increase ROM   She voiced understanding, asked me to let PT Gerald Stabs know  To [employer] only

## 2019-12-23 NOTE — Telephone Encounter (Signed)
Call received from patient - has questions related to surgery. Asked for return call at [phone].

## 2019-12-23 NOTE — Therapy (Signed)
[facility] [place], [place], [address] Phone: [phone]   Fax:  [phone]  Physical Therapy Treatment  Patient Details  Name: Laurie Kelley MRN: [id number] Date of Birth: January 09, 1971 Referring Provider (PT): Jorene Guest MD   Encounter Date: 12/23/2019   PT End of Session - 12/23/19 1803    Visit Number 11    Number of Visits 16    Date for PT Re-Evaluation 01/16/20    PT Start Time 0815    PT Stop Time 0905    PT Time Calculation (min) 50 min           Past Medical History:  Diagnosis Date  . Chest pain in adult 07/31/2017  . Esophageal reflux 08/18/2014  . Gallstones   . Hyperlipidemia with target LDL less than 100 08/18/2015  . Internal hemorrhoids 08/18/2014  . PONV (postoperative nausea and vomiting)     Past Surgical History:  Procedure Laterality Date  . CHOLECYSTECTOMY    . SHOULDER ARTHROSCOPY Right 11/06/2019   Procedure: ARTHROSCOPY RIGHT SHOULDER, DEBRIDEMENT X TWO STRUCTURES; OPEN ROTATOR CUFF REPAIR;  Surgeon: Carole Civil, MD;  Location: [facility];  Service: Orthopedics;  Laterality: Right;    There were no vitals filed for this visit.   Subjective Assessment - 12/23/19 0827    Subjective COVID-19 screen performed prior to patient entering clinic. Reports that she had a good day yesterday.Not taking any pain meds during the day. I'M very confused about what I can and can't do with my arm.    Pertinent History gall bladder removal, high cholesterol    Limitations House hold activities    Currently in Pain? Yes    Pain Score 5     Pain Location Shoulder    Pain Orientation Right    Pain Descriptors / Indicators Discomfort    Pain Type Surgical pain                             [facility] Adult PT Treatment/Exercise - 12/23/19 0001      Exercises   Exercises Shoulder      Shoulder Exercises: Seated   Other Seated Exercises Reviewed HEP  for table slides and pendulums       Shoulder Exercises: Pulleys   Flexion 3 minutes    Other Pulley Exercises seated UE ranger x 3 mins for flexion extension    Other Pulley Exercises seated flesion/extension on thigh      Modalities   Modalities Vasopneumatic      Vasopneumatic   Number Minutes Vasopneumatic  15 minutes    Vasopnuematic Location  Shoulder    Vasopneumatic Pressure Low    Vasopneumatic Temperature  34      Manual Therapy   Manual Therapy Passive ROM;Soft tissue mobilization    Passive ROM PROM/ PAROM  to R shoulder into flexion, ER, IR with gentle holds at end range and oscillations frequently to decrease muscle guarding                       PT Long Term Goals - 11/18/19 1142      PT LONG TERM GOAL #1   Title Patient will be independent with HEP and progression.    Time 8    Period Weeks    Status New      PT LONG TERM GOAL #2   Title Patient will report ability to perform basic  ADL's with pain </= 3/10 in R shoulder.    Time 8    Period Weeks    Status New      PT LONG TERM GOAL #3   Title Patient will improve her R shoulder flexion to >/= 140 degrees in order to improve ability for  functional tasks.    Baseline see flowsheets    Time 8    Period Weeks    Status New      PT LONG TERM GOAL #4   Title Pt will improve ER to >/= 45 degrees in order to improve functional mobility.                 Plan - 12/23/19 1803    Clinical Impression Statement Pt arrived today doing ok, but reports not sure what her Dr want's her to do or not do with RT UE. Pt was advised to contact MD office and or My chart messaging for RT UE precautions and also about pain meds. Pt was very guarded today,  but did well with v/cs and oscillations for relaxation. Normal modality response today    Examination-Activity Limitations Bathing;Carry;Dressing;Lift;Reach Overhead;Toileting    Examination-Participation Restrictions Other;Yard Work;Community Activity;Cleaning    Stability/Clinical  Decision Making Stable/Uncomplicated    Rehab Potential Excellent    PT Frequency 2x / week    PT Duration 8 weeks    PT Treatment/Interventions ADLs/Self Care Home Management;Cryotherapy;Electrical Stimulation;Moist Heat;Ultrasound;Therapeutic activities;Therapeutic exercise;Neuromuscular re-education;Patient/family education;Manual techniques;Passive range of motion;Taping;Vasopneumatic Device    PT Next Visit Plan Continue at MD discretion.           Patient will benefit from skilled therapeutic intervention in order to improve the following deficits and impairments:  Pain, Postural dysfunction, Decreased strength, Decreased range of motion, Impaired UE functional use, Increased edema  Visit Diagnosis: Acute pain of right shoulder  Stiffness of right shoulder, not elsewhere classified  Muscle weakness (generalized)  Localized edema     Problem List Patient Active Problem List   Diagnosis Date Noted  . S/P arthroscopy of right shoulder for debridement of calcific tendonitis and open rotator cuff repair  11/06/19 11/11/2019  . Nontraumatic incomplete tear of right rotator cuff   . Myalgia due to statin 12/23/2018  . Pain in joint of left shoulder 07/17/2018  . BMI 30.0-30.9,adult 07/04/2018  . Low HDL (under 40) 08/01/2017  . Hyperlipidemia with target LDL less than 100 08/18/2015  . Internal hemorrhoids 08/18/2014  . Esophageal reflux 08/18/2014    Terrianna Holsclaw,CHRIS, PTA 12/23/2019, 6:09 PM  [facility] [place], [place], [address] Phone: [phone]   Fax:  [phone]  Name: PHOENIX RIESEN MRN: [id number] Date of Birth: [birth year]

## 2019-12-25 ENCOUNTER — Other Ambulatory Visit: Payer: Self-pay

## 2019-12-25 ENCOUNTER — Ambulatory Visit: Payer: Commercial Managed Care - PPO | Admitting: Physical Therapy

## 2019-12-25 ENCOUNTER — Encounter: Payer: Self-pay | Admitting: Physical Therapy

## 2019-12-25 DIAGNOSIS — M25611 Stiffness of right shoulder, not elsewhere classified: Secondary | ICD-10-CM

## 2019-12-25 DIAGNOSIS — M25511 Pain in right shoulder: Secondary | ICD-10-CM

## 2019-12-25 DIAGNOSIS — R600 Localized edema: Secondary | ICD-10-CM

## 2019-12-25 DIAGNOSIS — M6281 Muscle weakness (generalized): Secondary | ICD-10-CM

## 2019-12-25 NOTE — Therapy (Signed)
[facility] [place], [place], [address] Phone: [phone]   Fax:  [phone]  Physical Therapy Treatment  Patient Details  Name: Laurie Kelley MRN: [id number] Date of Birth: May 20, 1971 Referring Provider (PT): Jorene Guest MD   Encounter Date: 12/25/2019   PT End of Session - 12/25/19 0914    Visit Number 12    Number of Visits 16    Date for PT Re-Evaluation 01/16/20    PT Start Time 0815    PT Stop Time 0905    PT Time Calculation (min) 50 min    Activity Tolerance Patient tolerated treatment well    Behavior During Therapy [facility] for tasks assessed/performed           Past Medical History:  Diagnosis Date  . Chest pain in adult 07/31/2017  . Esophageal reflux 08/18/2014  . Gallstones   . Hyperlipidemia with target LDL less than 100 08/18/2015  . Internal hemorrhoids 08/18/2014  . PONV (postoperative nausea and vomiting)     Past Surgical History:  Procedure Laterality Date  . CHOLECYSTECTOMY    . SHOULDER ARTHROSCOPY Right 11/06/2019   Procedure: ARTHROSCOPY RIGHT SHOULDER, DEBRIDEMENT X TWO STRUCTURES; OPEN ROTATOR CUFF REPAIR;  Surgeon: Carole Civil, MD;  Location: [facility];  Service: Orthopedics;  Laterality: Right;    There were no vitals filed for this visit.   Subjective Assessment - 12/25/19 0913    Subjective COVID-19 screen performed prior to patient entering clinic. Patient reports there's a catch that feels like it needs to pop around the deltoid region. States if that catch would go away she feels like she could progress more.    Pertinent History gall bladder removal, high cholesterol    Limitations House hold activities    Currently in Pain? Yes    Pain Score 1     Pain Location Shoulder    Pain Orientation Right    Pain Descriptors / Indicators Discomfort    Pain Type Surgical pain    Pain Onset 1 to 4 weeks ago              [facility] PT Assessment - 12/25/19 0001       Assessment   Medical Diagnosis R shoulder debridement and rotator cuff repair    Referring Provider (PT) Jorene Guest MD    Onset Date/Surgical Date 11/06/19    Hand Dominance Right    Next MD Visit 12/2019    Prior Therapy yes for left shoulder last year      Precautions   Precaution Comments no abduction or active flexion, sling off on 12/04/2019                         [facility] Adult PT Treatment/Exercise - 12/25/19 0001      Exercises   Exercises Shoulder      Modalities   Modalities Vasopneumatic      Vasopneumatic   Number Minutes Vasopneumatic  15 minutes    Vasopnuematic Location  Shoulder    Vasopneumatic Pressure Low    Vasopneumatic Temperature  34      Manual Therapy   Manual Therapy Passive ROM;Soft tissue mobilization    Passive ROM PROM to R shoulder into flexion, ER, and ABD with gentle holds at end range and oscillations frequently to decrease muscle guarding                       PT  Long Term Goals - 11/18/19 1142      PT LONG TERM GOAL #1   Title Patient will be independent with HEP and progression.    Time 8    Period Weeks    Status New      PT LONG TERM GOAL #2   Title Patient will report ability to perform basic ADL's with pain </= 3/10 in R shoulder.    Time 8    Period Weeks    Status New      PT LONG TERM GOAL #3   Title Patient will improve her R shoulder flexion to >/= 140 degrees in order to improve ability for  functional tasks.    Baseline see flowsheets    Time 8    Period Weeks    Status New      PT LONG TERM GOAL #4   Title Pt will improve ER to >/= 45 degrees in order to improve functional mobility.                 Plan - 12/25/19 0914    Clinical Impression Statement Patient was able to complete treatment but with pain. Patient heavily guarded with PROM especially with ER. Patient pointed to deltoid insertion region of where she feels the catch; very tender to palpation when assessed.  Cont pulleys next visit.    Examination-Activity Limitations Bathing;Carry;Dressing;Lift;Reach Overhead;Toileting    Examination-Participation Restrictions Other;Yard Work;Community Activity;Cleaning    Stability/Clinical Decision Making Stable/Uncomplicated    Clinical Decision Making Low    Rehab Potential Excellent    PT Frequency 2x / week    PT Duration 8 weeks    PT Treatment/Interventions ADLs/Self Care Home Management;Cryotherapy;Electrical Stimulation;Moist Heat;Ultrasound;Therapeutic activities;Therapeutic exercise;Neuromuscular re-education;Patient/family education;Manual techniques;Passive range of motion;Taping;Vasopneumatic Device    PT Next Visit Plan Continue at MD discretion.    PT Home Exercise Plan pendulums, table slides    Consulted and Agree with Plan of Care Patient           Patient will benefit from skilled therapeutic intervention in order to improve the following deficits and impairments:  Pain, Postural dysfunction, Decreased strength, Decreased range of motion, Impaired UE functional use, Increased edema  Visit Diagnosis: Acute pain of right shoulder  Stiffness of right shoulder, not elsewhere classified  Muscle weakness (generalized)  Localized edema     Problem List Patient Active Problem List   Diagnosis Date Noted  . S/P arthroscopy of right shoulder for debridement of calcific tendonitis and open rotator cuff repair  11/06/19 11/11/2019  . Nontraumatic incomplete tear of right rotator cuff   . Myalgia due to statin 12/23/2018  . Pain in joint of left shoulder 07/17/2018  . BMI 30.0-30.9,adult 07/04/2018  . Low HDL (under 40) 08/01/2017  . Hyperlipidemia with target LDL less than 100 08/18/2015  . Internal hemorrhoids 08/18/2014  . Esophageal reflux 08/18/2014    Gabriela Eves, PT, DPT 12/25/2019, 10:14 AM  [facility] [address] [place], [place], [address] Phone: [phone]   Fax:   [phone]  Name: Laurie Kelley MRN: [id number] Date of Birth: 1970-11-10

## 2019-12-30 ENCOUNTER — Other Ambulatory Visit: Payer: Self-pay

## 2019-12-30 ENCOUNTER — Ambulatory Visit: Payer: Commercial Managed Care - PPO | Admitting: *Deleted

## 2019-12-30 DIAGNOSIS — M6281 Muscle weakness (generalized): Secondary | ICD-10-CM

## 2019-12-30 DIAGNOSIS — M25511 Pain in right shoulder: Secondary | ICD-10-CM

## 2019-12-30 DIAGNOSIS — M25611 Stiffness of right shoulder, not elsewhere classified: Secondary | ICD-10-CM

## 2019-12-30 DIAGNOSIS — R600 Localized edema: Secondary | ICD-10-CM

## 2019-12-30 NOTE — Therapy (Signed)
[facility] [place], [place], [address] Phone: [phone]   Fax:  [phone]  Physical Therapy Treatment  Patient Details  Name: Laurie Kelley MRN: [id number] Date of Birth: [birth year] Referring Provider (PT): Jorene Guest MD   Encounter Date: 12/30/2019   PT End of Session - 12/30/19 0945    Visit Number 13    Number of Visits 16    Date for PT Re-Evaluation 01/16/20    PT Start Time 0945    PT Stop Time 4259    PT Time Calculation (min) 50 min           Past Medical History:  Diagnosis Date  . Chest pain in adult 07/31/2017  . Esophageal reflux 08/18/2014  . Gallstones   . Hyperlipidemia with target LDL less than 100 08/18/2015  . Internal hemorrhoids 08/18/2014  . PONV (postoperative nausea and vomiting)     Past Surgical History:  Procedure Laterality Date  . CHOLECYSTECTOMY    . SHOULDER ARTHROSCOPY Right 11/06/2019   Procedure: ARTHROSCOPY RIGHT SHOULDER, DEBRIDEMENT X TWO STRUCTURES; OPEN ROTATOR CUFF REPAIR;  Surgeon: Carole Civil, MD;  Location: [facility];  Service: Orthopedics;  Laterality: Right;    There were no vitals filed for this visit.   Subjective Assessment - 12/30/19 0947    Subjective COVID-19 screen performed prior to patient entering clinic. Pt reported calling MD office about taking pain meds and how much she can use her arm. No pushing, pulling, or lifting as per Pt.    Pertinent History gall bladder removal, high cholesterol    Limitations House hold activities    Currently in Pain? Yes    Pain Score 2     Pain Location Shoulder    Pain Orientation Right                             [facility] Adult PT Treatment/Exercise - 12/30/19 0001      Exercises   Exercises Shoulder      Shoulder Exercises: Pulleys   Flexion 3 minutes    Other Pulley Exercises seated UE ranger x 3 mins for flexion/ extension      Modalities   Modalities Vasopneumatic       Vasopneumatic   Number Minutes Vasopneumatic  15 minutes    Vasopnuematic Location  Shoulder    Vasopneumatic Pressure Low    Vasopneumatic Temperature  34      Manual Therapy   Manual Therapy Passive ROM;Soft tissue mobilization    Passive ROM Supine PROM to R shoulder into flexion, ER, and ABD with gentle holds at end range and oscillations frequently to decrease muscle guarding                       PT Long Term Goals - 11/18/19 1142      PT LONG TERM GOAL #1   Title Patient will be independent with HEP and progression.    Time 8    Period Weeks    Status New      PT LONG TERM GOAL #2   Title Patient will report ability to perform basic ADL's with pain </= 3/10 in R shoulder.    Time 8    Period Weeks    Status New      PT LONG TERM GOAL #3   Title Patient will improve her R shoulder flexion to >/= 140 degrees in order  to improve ability for  functional tasks.    Baseline see flowsheets    Time 8    Period Weeks    Status New      PT LONG TERM GOAL #4   Title Pt will improve ER to >/= 45 degrees in order to improve functional mobility.                 Plan - 12/30/19 1028    Clinical Impression Statement Pt was able to perform light AAROM exs on pulleys and seated UE ranger. PROM performed for elevation and ER, but still very guarded. PROM to 95 degrees elevation and 20 degrees for ER in supine. v/c's and oscillations to help decrease mm gaurding. Normal vaso response    Examination-Activity Limitations Bathing;Carry;Dressing;Lift;Reach Overhead;Toileting    Examination-Participation Restrictions Other;Yard Work;Community Activity;Cleaning    Stability/Clinical Decision Making Stable/Uncomplicated    Rehab Potential Excellent    PT Frequency 2x / week    PT Duration 8 weeks    PT Treatment/Interventions ADLs/Self Care Home Management;Cryotherapy;Electrical Stimulation;Moist Heat;Ultrasound;Therapeutic activities;Therapeutic exercise;Neuromuscular  re-education;Patient/family education;Manual techniques;Passive range of motion;Taping;Vasopneumatic Device    PT Next Visit Plan Continue at MD discretion.  MD note next visit    7 weeks on 12-25-19    PT Home Exercise Plan pendulums, table slides    Consulted and Agree with Plan of Care Patient           Patient will benefit from skilled therapeutic intervention in order to improve the following deficits and impairments:  Pain, Postural dysfunction, Decreased strength, Decreased range of motion, Impaired UE functional use, Increased edema  Visit Diagnosis: Stiffness of right shoulder, not elsewhere classified  Acute pain of right shoulder  Muscle weakness (generalized)  Localized edema     Problem List Patient Active Problem List   Diagnosis Date Noted  . S/P arthroscopy of right shoulder for debridement of calcific tendonitis and open rotator cuff repair  11/06/19 11/11/2019  . Nontraumatic incomplete tear of right rotator cuff   . Myalgia due to statin 12/23/2018  . Pain in joint of left shoulder 07/17/2018  . BMI 30.0-30.9,adult 07/04/2018  . Low HDL (under 40) 08/01/2017  . Hyperlipidemia with target LDL less than 100 08/18/2015  . Internal hemorrhoids 08/18/2014  . Esophageal reflux 08/18/2014    Celesta Funderburk,CHRIS, PTA 12/30/2019, 5:53 PM  [facility] [place], [place], [address] Phone: [phone]   Fax:  [phone]  Name: MALISSIE MUSGRAVE MRN: [id number] Date of Birth: 1970-12-25

## 2020-01-01 ENCOUNTER — Ambulatory Visit: Payer: Commercial Managed Care - PPO | Admitting: Physical Therapy

## 2020-01-01 ENCOUNTER — Encounter: Payer: Self-pay | Admitting: Physical Therapy

## 2020-01-01 ENCOUNTER — Other Ambulatory Visit: Payer: Self-pay

## 2020-01-01 DIAGNOSIS — M6281 Muscle weakness (generalized): Secondary | ICD-10-CM

## 2020-01-01 DIAGNOSIS — M25511 Pain in right shoulder: Secondary | ICD-10-CM | POA: Diagnosis not present

## 2020-01-01 DIAGNOSIS — M25512 Pain in left shoulder: Secondary | ICD-10-CM

## 2020-01-01 DIAGNOSIS — M25611 Stiffness of right shoulder, not elsewhere classified: Secondary | ICD-10-CM

## 2020-01-01 DIAGNOSIS — R600 Localized edema: Secondary | ICD-10-CM

## 2020-01-01 NOTE — Therapy (Signed)
[facility] [place], [place], [address] Phone: [phone]   Fax:  [phone]  Physical Therapy Treatment  Patient Details  Name: Laurie Kelley MRN: [id number] Date of Birth: [birth year] Referring Provider (PT): Jorene Guest MD   Encounter Date: 01/01/2020   PT End of Session - 01/01/20 0821    Visit Number 14    Number of Visits 16    Date for PT Re-Evaluation 01/16/20    PT Start Time 0815    PT Stop Time 0905    PT Time Calculation (min) 50 min    Activity Tolerance Patient tolerated treatment well    Behavior During Therapy [facility] for tasks assessed/performed           Past Medical History:  Diagnosis Date  . Chest pain in adult 07/31/2017  . Esophageal reflux 08/18/2014  . Gallstones   . Hyperlipidemia with target LDL less than 100 08/18/2015  . Internal hemorrhoids 08/18/2014  . PONV (postoperative nausea and vomiting)     Past Surgical History:  Procedure Laterality Date  . CHOLECYSTECTOMY    . SHOULDER ARTHROSCOPY Right 11/06/2019   Procedure: ARTHROSCOPY RIGHT SHOULDER, DEBRIDEMENT X TWO STRUCTURES; OPEN ROTATOR CUFF REPAIR;  Surgeon: Carole Civil, MD;  Location: [facility];  Service: Orthopedics;  Laterality: Right;    There were no vitals filed for this visit.   Subjective Assessment - 01/01/20 0819    Subjective Covid-19 screen perofmred piror to entering clinic. Pt reporting 4/10 pain. Pt states she has an appointment with Dr. Aline Brochure tomorrow.    Pertinent History gall bladder removal, high cholesterol    Limitations House hold activities    Patient Stated Goals Use my arm without pain    Currently in Pain? Yes    Pain Score 4     Pain Location Shoulder    Pain Orientation Right    Pain Descriptors / Indicators Sore    Pain Type Surgical pain    Pain Onset 1 to 4 weeks ago    Pain Frequency Constant              [facility] PT Assessment - 01/01/20 0001      Assessment   Medical  Diagnosis R shoulder debridement and rotator cuff repair    Referring Provider (PT) Jorene Guest MD    Onset Date/Surgical Date 11/06/19    Hand Dominance Right    Next MD Visit 12/2019      PROM   Right Shoulder Flexion 120 Degrees    Right Shoulder External Rotation 22 Degrees    Left Shoulder ABduction 80 Degrees                         [facility] Adult PT Treatment/Exercise - 01/01/20 0001      Exercises   Exercises Shoulder      Shoulder Exercises: Pulleys   Flexion 3 minutes    Flexion Limitations compensation noted    Other Pulley Exercises seated UE ranger x 3 mins for flexion/ extension      Shoulder Exercises: Stretch   Table Stretch - Flexion Limitations    Table Stretch -Flexion Limitations 20 times holding 5 seoonds   also performed in scaption x 20 holding 5 seconds     Vasopneumatic   Number Minutes Vasopneumatic  15 minutes    Vasopnuematic Location  Shoulder    Vasopneumatic Pressure Low    Vasopneumatic Temperature  34  Manual Therapy   Manual Therapy Passive ROM    Manual therapy comments Pt guarding throughout session today.    Passive ROM Supine PROM to R shoulder into flexion, ER, and ABD with gentle holds at end range and oscillations                       PT Long Term Goals - 01/01/20 0906      PT LONG TERM GOAL #1   Title Patient will be independent with HEP and progression.    Status On-going      PT LONG TERM GOAL #2   Title Patient will report ability to perform basic ADL's with pain </= 3/10 in R shoulder.    Status On-going      PT LONG TERM GOAL #3   Title Patient will improve her R shoulder flexion to >/= 140 degrees in order to improve ability for  functional tasks.    Status On-going      PT LONG TERM GOAL #4   Title Pt will improve ER to >/= 45 degrees in order to improve functional mobility.    Status On-going                 Plan - 01/01/20 6433    Clinical Impression Statement Pt  arriving today reporting 4/10 pain in her R shoulder. Pt reporting MD appointment tomorrow with Dr. Aline Brochure. Pt still presenting with limitations in all movements and painful arc of motion. PROM: R shoulder flexion 120 degrees, ER 22 degrees in supine. Pt still guarding against full passive range. Continue skilled PT to progress toward pt's LTG's.    Examination-Activity Limitations Bathing;Carry;Dressing;Lift;Reach Overhead;Toileting    Examination-Participation Restrictions Other;Yard Work;Community Activity;Cleaning    Stability/Clinical Decision Making Stable/Uncomplicated    Rehab Potential Excellent    PT Frequency 2x / week    PT Duration 8 weeks    PT Treatment/Interventions ADLs/Self Care Home Management;Cryotherapy;Electrical Stimulation;Moist Heat;Ultrasound;Therapeutic activities;Therapeutic exercise;Neuromuscular re-education;Patient/family education;Manual techniques;Passive range of motion;Taping;Vasopneumatic Device    PT Next Visit Plan Continue at MD discretion.  MD note next visit    7 weeks on 12-25-19    PT Home Exercise Plan pendulums, table slides    Consulted and Agree with Plan of Care Patient           Patient will benefit from skilled therapeutic intervention in order to improve the following deficits and impairments:  Pain, Postural dysfunction, Decreased strength, Decreased range of motion, Impaired UE functional use, Increased edema  Visit Diagnosis: Stiffness of right shoulder, not elsewhere classified  Acute pain of right shoulder  Muscle weakness (generalized)  Localized edema  Acute pain of left shoulder     Problem List Patient Active Problem List   Diagnosis Date Noted  . S/P arthroscopy of right shoulder for debridement of calcific tendonitis and open rotator cuff repair  11/06/19 11/11/2019  . Nontraumatic incomplete tear of right rotator cuff   . Myalgia due to statin 12/23/2018  . Pain in joint of left shoulder 07/17/2018  . BMI  30.0-30.9,adult 07/04/2018  . Low HDL (under 40) 08/01/2017  . Hyperlipidemia with target LDL less than 100 08/18/2015  . Internal hemorrhoids 08/18/2014  . Esophageal reflux 08/18/2014    Oretha Caprice, PT, MPT 01/01/2020, 9:07 AM  [facility] [address] [place], [place], [address] Phone: [phone]   Fax:  [phone]  Name: Laurie Kelley MRN: [id number] Date of Birth: 07/26/1970

## 2020-01-02 ENCOUNTER — Ambulatory Visit (INDEPENDENT_AMBULATORY_CARE_PROVIDER_SITE_OTHER): Payer: Commercial Managed Care - PPO | Admitting: Orthopedic Surgery

## 2020-01-02 ENCOUNTER — Other Ambulatory Visit: Payer: Self-pay | Admitting: Family Medicine

## 2020-01-02 ENCOUNTER — Encounter: Payer: Self-pay | Admitting: Orthopedic Surgery

## 2020-01-02 DIAGNOSIS — G8929 Other chronic pain: Secondary | ICD-10-CM

## 2020-01-02 DIAGNOSIS — M7531 Calcific tendinitis of right shoulder: Secondary | ICD-10-CM

## 2020-01-02 DIAGNOSIS — M25511 Pain in right shoulder: Secondary | ICD-10-CM

## 2020-01-02 DIAGNOSIS — Z9889 Other specified postprocedural states: Secondary | ICD-10-CM

## 2020-01-02 MED ORDER — HYDROCODONE-ACETAMINOPHEN 5-325 MG PO TABS: 1.0000 | ORAL_TABLET | Freq: Four times a day (QID) | ORAL | 0 refills | Status: AC | PRN

## 2020-01-02 MED ORDER — IBUPROFEN 800 MG PO TABS: 800.0000 mg | ORAL_TABLET | Freq: Three times a day (TID) | ORAL | 1 refills | Status: DC | PRN

## 2020-01-02 NOTE — Progress Notes (Signed)
Chief Complaint  Patient presents with  . Shoulder Pain    R/DOS 11/06/19/ still hurting and can't use it a lot    57 days / 8  weeks post op   Next 4 days no exercises   Ibuprofen 800 tid   Ice 3 x day   Sling over the week end   F/U 1 week  Meds ordered this encounter  Medications  . ibuprofen (ADVIL) 800 MG tablet    Sig: Take 1 tablet (800 mg total) by mouth every 8 (eight) hours as needed.    Dispense:  90 tablet    Refill:  1  . HYDROcodone-acetaminophen (NORCO/VICODIN) 5-325 MG tablet    Sig: Take 1 tablet by mouth every 6 (six) hours as needed for up to 7 days for moderate pain.    Dispense:  28 tablet    Refill:  0

## 2020-01-02 NOTE — Patient Instructions (Signed)
57 days / 8  weeks post op   Next 4 days no exercises   Ibuprofen 800 tid   Ice 3 x day   Sling over the week end   F/U 1 week

## 2020-01-06 ENCOUNTER — Other Ambulatory Visit: Payer: Self-pay

## 2020-01-06 ENCOUNTER — Ambulatory Visit: Payer: Commercial Managed Care - PPO | Admitting: Physical Therapy

## 2020-01-06 ENCOUNTER — Encounter: Payer: Self-pay | Admitting: Physical Therapy

## 2020-01-06 DIAGNOSIS — M6281 Muscle weakness (generalized): Secondary | ICD-10-CM

## 2020-01-06 DIAGNOSIS — R600 Localized edema: Secondary | ICD-10-CM

## 2020-01-06 DIAGNOSIS — M25611 Stiffness of right shoulder, not elsewhere classified: Secondary | ICD-10-CM

## 2020-01-06 DIAGNOSIS — M25511 Pain in right shoulder: Secondary | ICD-10-CM

## 2020-01-06 NOTE — Therapy (Signed)
[facility] [place], [place], [address] Phone: [phone]   Fax:  [phone]  Physical Therapy Treatment  Patient Details  Name: Laurie Kelley MRN: [id number] Date of Birth: 1971-05-11 Referring Provider (PT): Jorene Guest MD   Encounter Date: 01/06/2020   PT End of Session - 01/06/20 0819    Visit Number 15    Number of Visits 16    Date for PT Re-Evaluation 01/16/20    PT Start Time 0819    PT Stop Time 0900    PT Time Calculation (min) 41 min    Activity Tolerance Patient tolerated treatment well    Behavior During Therapy [facility] for tasks assessed/performed           Past Medical History:  Diagnosis Date  . Chest pain in adult 07/31/2017  . Esophageal reflux 08/18/2014  . Gallstones   . Hyperlipidemia with target LDL less than 100 08/18/2015  . Internal hemorrhoids 08/18/2014  . PONV (postoperative nausea and vomiting)     Past Surgical History:  Procedure Laterality Date  . CHOLECYSTECTOMY    . SHOULDER ARTHROSCOPY Right 11/06/2019   Procedure: ARTHROSCOPY RIGHT SHOULDER, DEBRIDEMENT X TWO STRUCTURES; OPEN ROTATOR CUFF REPAIR;  Surgeon: Carole Civil, MD;  Location: [facility];  Service: Orthopedics;  Laterality: Right;    There were no vitals filed for this visit.   Subjective Assessment - 01/06/20 0818    Subjective Covid-19 screen perofmred piror to entering clinic. Reports she did no exercises over the weekend and used sling per MD request on Friday.    Pertinent History gall bladder removal, high cholesterol    Limitations House hold activities    Patient Stated Goals Use my arm without pain    Currently in Pain? Yes    Pain Score 1     Pain Location Shoulder    Pain Orientation Right    Pain Descriptors / Indicators Tightness    Pain Type Surgical pain    Pain Onset More than a month ago    Pain Frequency Constant              [facility] PT Assessment - 01/06/20 0001      Assessment    Medical Diagnosis R shoulder debridement and rotator cuff repair    Referring Provider (PT) Jorene Guest MD    Onset Date/Surgical Date 11/06/19    Hand Dominance Right    Next MD Visit 01/12/2020    Prior Therapy yes for left shoulder last year      Precautions   Precaution Comments no abduction or active flexion, sling off on 12/04/2019      Restrictions   Weight Bearing Restrictions No                         [facility] Adult PT Treatment/Exercise - 01/06/20 0001      Modalities   Modalities Electrical Stimulation;Vasopneumatic      Electrical Stimulation   Electrical Stimulation Location L shoulder    Electrical Stimulation Action Pre-Mod    Electrical Stimulation Parameters 80-150 hz x15 min    Electrical Stimulation Goals Pain;Edema      Vasopneumatic   Number Minutes Vasopneumatic  15 minutes    Vasopnuematic Location  Shoulder    Vasopneumatic Pressure Low    Vasopneumatic Temperature  34      Manual Therapy   Manual Therapy Passive ROM    Manual therapy comments Patient guarded throughout  therex and required intermittant oscillations to RUE     Passive ROM PROM of R shoulder into flexion, ER, IR with gentle holds at end range                       PT Long Term Goals - 01/06/20 0920      PT LONG TERM GOAL #1   Title Patient will be independent with HEP and progression.    Status Achieved      PT LONG TERM GOAL #2   Title Patient will report ability to perform basic ADL's with pain </= 3/10 in R shoulder.    Status On-going      PT LONG TERM GOAL #3   Title Patient will improve her R shoulder flexion to >/= 140 degrees in order to improve ability for  functional tasks.    Status On-going      PT LONG TERM GOAL #4   Title Pt will improve ER to >/= 45 degrees in order to improve functional mobility.    Status On-going                 Plan - 01/06/20 5573    Clinical Impression Statement Patient presented in clinic after  having a rough week and weekend due to pain and inflammation from last treatment. Patient has obeyed all of MD's orders regarding medication and icing without HEP. Patient continues to guard with PROM especially into flexion and required gentle oscillations throughout PROM session. Firm end feels and smooth arc of motion noted during PROM of R shoulder in all directions. Normal modalities response noted following removal of the modalities. Patient educated that she could use TENS unit at home either prior to or after using ice within a comfortable range.    Examination-Activity Limitations Bathing;Carry;Dressing;Lift;Reach Overhead;Toileting    Examination-Participation Restrictions Other;Yard Work;Community Activity;Cleaning    Stability/Clinical Decision Making Stable/Uncomplicated    Rehab Potential Excellent    PT Frequency 2x / week    PT Duration 8 weeks    PT Treatment/Interventions ADLs/Self Care Home Management;Cryotherapy;Electrical Stimulation;Moist Heat;Ultrasound;Therapeutic activities;Therapeutic exercise;Neuromuscular re-education;Patient/family education;Manual techniques;Passive range of motion;Taping;Vasopneumatic Device    PT Next Visit Plan Consider progressing to pulleys in supine along with UE ranger and table slides.    PT Home Exercise Plan pendulums, table slides    Consulted and Agree with Plan of Care Patient           Patient will benefit from skilled therapeutic intervention in order to improve the following deficits and impairments:  Pain, Postural dysfunction, Decreased strength, Decreased range of motion, Impaired UE functional use, Increased edema  Visit Diagnosis: Stiffness of right shoulder, not elsewhere classified  Acute pain of right shoulder  Muscle weakness (generalized)  Localized edema     Problem List Patient Active Problem List   Diagnosis Date Noted  . S/P arthroscopy of right shoulder for debridement of calcific tendonitis and open  rotator cuff repair  11/06/19 11/11/2019  . Nontraumatic incomplete tear of right rotator cuff   . Myalgia due to statin 12/23/2018  . Pain in joint of left shoulder 07/17/2018  . BMI 30.0-30.9,adult 07/04/2018  . Low HDL (under 40) 08/01/2017  . Hyperlipidemia with target LDL less than 100 08/18/2015  . Internal hemorrhoids 08/18/2014  . Esophageal reflux 08/18/2014    Standley Brooking, PTA 01/06/2020, 9:23 AM  [facility] [address] [place], [place], [address] Phone: [phone]   Fax:  [phone]  Name: LATANGA NEDROW MRN: [id number] Date of Birth: [birth year]

## 2020-01-08 ENCOUNTER — Ambulatory Visit: Payer: Commercial Managed Care - PPO | Admitting: *Deleted

## 2020-01-08 ENCOUNTER — Other Ambulatory Visit: Payer: Self-pay

## 2020-01-08 DIAGNOSIS — M25611 Stiffness of right shoulder, not elsewhere classified: Secondary | ICD-10-CM

## 2020-01-08 DIAGNOSIS — M25511 Pain in right shoulder: Secondary | ICD-10-CM

## 2020-01-08 DIAGNOSIS — M6281 Muscle weakness (generalized): Secondary | ICD-10-CM

## 2020-01-08 DIAGNOSIS — R600 Localized edema: Secondary | ICD-10-CM

## 2020-01-08 NOTE — Therapy (Signed)
[facility] [place], [place], [address] Phone: [phone]   Fax:  [phone]  Physical Therapy Treatment  Patient Details  Name: Laurie Kelley MRN: [id number] Date of Birth: [birth year] Referring Provider (PT): Jorene Guest MD   Encounter Date: 01/08/2020   PT End of Session - 01/08/20 0912    Visit Number 16    Number of Visits 28    Date for PT Re-Evaluation 02/20/20    PT Start Time 0815    PT Stop Time 0905    PT Time Calculation (min) 50 min           Past Medical History:  Diagnosis Date  . Chest pain in adult 07/31/2017  . Esophageal reflux 08/18/2014  . Gallstones   . Hyperlipidemia with target LDL less than 100 08/18/2015  . Internal hemorrhoids 08/18/2014  . PONV (postoperative nausea and vomiting)     Past Surgical History:  Procedure Laterality Date  . CHOLECYSTECTOMY    . SHOULDER ARTHROSCOPY Right 11/06/2019   Procedure: ARTHROSCOPY RIGHT SHOULDER, DEBRIDEMENT X TWO STRUCTURES; OPEN ROTATOR CUFF REPAIR;  Surgeon: Carole Civil, MD;  Location: [facility];  Service: Orthopedics;  Laterality: Right;    There were no vitals filed for this visit.   Subjective Assessment - 01/08/20 0823    Subjective Covid-19 screen perofmred piror to entering clinic. Reports she did no exercises over the weekend and used sling per MD. Back to MD on Monday    Pertinent History gall bladder removal, high cholesterol    Patient Stated Goals Use my arm without pain    Currently in Pain? Yes    Pain Score 5     Pain Location Shoulder    Pain Descriptors / Indicators Tightness    Pain Type Surgical pain    Pain Onset More than a month ago                             [facility] Adult PT Treatment/Exercise - 01/08/20 0001      Modalities   Modalities Electrical Stimulation;Vasopneumatic      Vasopneumatic   Number Minutes Vasopneumatic  15 minutes    Vasopnuematic Location  Shoulder     Vasopneumatic Pressure Low    Vasopneumatic Temperature  34      Manual Therapy   Manual Therapy Passive ROM    Manual therapy comments flexion 130 degrees, ER 30 degrees    Passive ROM PROM of R shoulder into flexion, ER, IR with gentle holds at end range                       PT Long Term Goals - 01/06/20 0920      PT LONG TERM GOAL #1   Title Patient will be independent with HEP and progression.    Status Achieved      PT LONG TERM GOAL #2   Title Patient will report ability to perform basic ADL's with pain </= 3/10 in R shoulder.    Status On-going      PT LONG TERM GOAL #3   Title Patient will improve her R shoulder flexion to >/= 140 degrees in order to improve ability for  functional tasks.    Status On-going      PT LONG TERM GOAL #4   Title Pt will improve ER to >/= 45 degrees in order to improve functional mobility.  Status On-going                 Plan - 01/08/20 7829    Clinical Impression Statement Pt arrived today doing fair, but with 5/10 pain LT shldr. Rx focused on gentle PROM and oscillations for relaxation f/b Vaso for pain/inflamation. PROM for scaption 130 degrees and ER to 30 degrees.    Examination-Activity Limitations Bathing;Carry;Dressing;Lift;Reach Overhead;Toileting    Examination-Participation Restrictions Other;Yard Work;Community Activity;Cleaning    Stability/Clinical Decision Making Stable/Uncomplicated    Rehab Potential Excellent    PT Frequency 2x / week    PT Duration 8 weeks    PT Treatment/Interventions ADLs/Self Care Home Management;Cryotherapy;Electrical Stimulation;Moist Heat;Ultrasound;Therapeutic activities;Therapeutic exercise;Neuromuscular re-education;Patient/family education;Manual techniques;Passive range of motion;Taping;Vasopneumatic Device    PT Next Visit Plan To MD on Monday and continue as per MD    Consulted and Agree with Plan of Care Patient           Patient will benefit from skilled  therapeutic intervention in order to improve the following deficits and impairments:  Pain, Postural dysfunction, Decreased strength, Decreased range of motion, Impaired UE functional use, Increased edema  Visit Diagnosis: Acute pain of right shoulder  Stiffness of right shoulder, not elsewhere classified  Muscle weakness (generalized)  Localized edema     Problem List Patient Active Problem List   Diagnosis Date Noted  . S/P arthroscopy of right shoulder for debridement of calcific tendonitis and open rotator cuff repair  11/06/19 11/11/2019  . Nontraumatic incomplete tear of right rotator cuff   . Myalgia due to statin 12/23/2018  . Pain in joint of left shoulder 07/17/2018  . BMI 30.0-30.9,adult 07/04/2018  . Low HDL (under 40) 08/01/2017  . Hyperlipidemia with target LDL less than 100 08/18/2015  . Internal hemorrhoids 08/18/2014  . Esophageal reflux 08/18/2014    Tab Rylee, CHRIS, PTA 01/08/2020, 1:10 PM  [facility] [place], [place], [address] Phone: [phone]   Fax:  [phone]  Name: Laurie Kelley MRN: [id number] Date of Birth: October 13, 1970

## 2020-01-12 ENCOUNTER — Ambulatory Visit (INDEPENDENT_AMBULATORY_CARE_PROVIDER_SITE_OTHER): Payer: Commercial Managed Care - PPO | Admitting: Orthopedic Surgery

## 2020-01-12 ENCOUNTER — Encounter: Payer: Self-pay | Admitting: Orthopedic Surgery

## 2020-01-12 ENCOUNTER — Other Ambulatory Visit: Payer: Self-pay

## 2020-01-12 DIAGNOSIS — Z9889 Other specified postprocedural states: Secondary | ICD-10-CM

## 2020-01-12 NOTE — Progress Notes (Signed)
Chief Complaint  Patient presents with  . Routine Post Op    right shoulder surgery 11/06/19   Laurie Kelley comes back in today with improved pain control still has significant external rotation deficit passive abduction is close to 90 and flexion is close to 110  For physical therapy we are going to change her to supine pulley self-directed range of motion instead of upright and then passive and active assisted range of motion as tolerated  She wants to go back to work and will get back to me regarding the light duty note that she will need  Follow-up in 2 weeks

## 2020-01-12 NOTE — Patient Instructions (Signed)
Continue therapy   Take the meds as ordered   F/u 2 weeks

## 2020-01-13 ENCOUNTER — Other Ambulatory Visit: Payer: Self-pay

## 2020-01-13 ENCOUNTER — Ambulatory Visit: Payer: Commercial Managed Care - PPO | Admitting: Physical Therapy

## 2020-01-13 DIAGNOSIS — M6281 Muscle weakness (generalized): Secondary | ICD-10-CM

## 2020-01-13 DIAGNOSIS — M25511 Pain in right shoulder: Secondary | ICD-10-CM | POA: Diagnosis not present

## 2020-01-13 DIAGNOSIS — M25611 Stiffness of right shoulder, not elsewhere classified: Secondary | ICD-10-CM

## 2020-01-13 DIAGNOSIS — R600 Localized edema: Secondary | ICD-10-CM

## 2020-01-13 NOTE — Therapy (Signed)
[name], [place], [address] Phone: [phone]   Fax:  [phone]  Physical Therapy Treatment  Patient Details  Name: Laurie Kelley MRN: [id number] Date of Birth: 1971/03/04 Referring Provider (PT): Jorene Guest MD   Encounter Date: 01/13/2020   PT End of Session - 01/13/20 0904    Visit Number 17    Number of Visits 28    Date for PT Re-Evaluation 02/20/20    PT Start Time 0815    PT Stop Time 0905    PT Time Calculation (min) 50 min    Activity Tolerance Patient tolerated treatment well    Behavior During Therapy [facility] for tasks assessed/performed           Past Medical History:  Diagnosis Date  . Chest pain in adult 07/31/2017  . Esophageal reflux 08/18/2014  . Gallstones   . Hyperlipidemia with target LDL less than 100 08/18/2015  . Internal hemorrhoids 08/18/2014  . PONV (postoperative nausea and vomiting)     Past Surgical History:  Procedure Laterality Date  . CHOLECYSTECTOMY    . SHOULDER ARTHROSCOPY Right 11/06/2019   Procedure: ARTHROSCOPY RIGHT SHOULDER, DEBRIDEMENT X TWO STRUCTURES; OPEN ROTATOR CUFF REPAIR;  Surgeon: Carole Civil, MD;  Location: [facility];  Service: Orthopedics;  Laterality: Right;    There were no vitals filed for this visit.   Subjective Assessment - 01/13/20 1303    Subjective COVID-19 screening performed upon arrival. Reports feeling better 2-3/10 pain currently. Per MD, supine pulleys, cont. PROM and AAROM as tolerable. Will report for follow up on 01/27/2020.    Pertinent History gall bladder removal, high cholesterol    Limitations House hold activities    Patient Stated Goals Use my arm without pain    Currently in Pain? Yes    Pain Score 3     Pain Location Shoulder    Pain Orientation Right    Pain Descriptors / Indicators Tightness    Pain Type Surgical pain    Pain Onset More than a month ago    Pain Frequency Constant                              [facility] Adult PT Treatment/Exercise - 01/13/20 0001      Shoulder Exercises: Pulleys   Flexion 3 minutes    Flexion Limitations in supine    Scaption 3 minutes    Scaption Limitations in supine      Modalities   Modalities Electrical Stimulation;Vasopneumatic      Vasopneumatic   Number Minutes Vasopneumatic  15 minutes    Vasopnuematic Location  Shoulder    Vasopneumatic Pressure Low    Vasopneumatic Temperature  34      Manual Therapy   Manual Therapy Passive ROM    Passive ROM PROM of R shoulder into flexion, ER, IR with gentle holds at end range                       PT Long Term Goals - 01/06/20 0920      PT LONG TERM GOAL #1   Title Patient will be independent with HEP and progression.    Status Achieved      PT LONG TERM GOAL #2   Title Patient will report ability to perform basic ADL's with pain </= 3/10 in R shoulder.    Status On-going  PT LONG TERM GOAL #3   Title Patient will improve her R shoulder flexion to >/= 140 degrees in order to improve ability for  functional tasks.    Status On-going      PT LONG TERM GOAL #4   Title Pt will improve ER to >/= 45 degrees in order to improve functional mobility.    Status On-going                 Plan - 01/13/20 0905    Clinical Impression Statement Patient arrived with low levels of pain currently in R shoulder. Pulleys initiated in supine with better response in comparison to sitting. Patient still heavily guarded with ER PROM requiring oscillations to prevent guarding. No adverse effects upon removal of modalities.    Examination-Activity Limitations Bathing;Carry;Dressing;Lift;Reach Overhead;Toileting    Examination-Participation Restrictions Other;Yard Work;Community Activity;Cleaning    Stability/Clinical Decision Making Stable/Uncomplicated    Clinical Decision Making Low    Rehab Potential Excellent    PT Frequency 2x / week    PT Duration 8  weeks    PT Treatment/Interventions ADLs/Self Care Home Management;Cryotherapy;Electrical Stimulation;Moist Heat;Ultrasound;Therapeutic activities;Therapeutic exercise;Neuromuscular re-education;Patient/family education;Manual techniques;Passive range of motion;Taping;Vasopneumatic Device    PT Next Visit Plan MD follow up Aug 10; continue pulleys in supine; PROM, Modalities As needed.    PT Home Exercise Plan pendulums, table slides    Consulted and Agree with Plan of Care Patient           Patient will benefit from skilled therapeutic intervention in order to improve the following deficits and impairments:  Pain, Postural dysfunction, Decreased strength, Decreased range of motion, Impaired UE functional use, Increased edema  Visit Diagnosis: Acute pain of right shoulder  Stiffness of right shoulder, not elsewhere classified  Muscle weakness (generalized)  Localized edema     Problem List Patient Active Problem List   Diagnosis Date Noted  . S/P arthroscopy of right shoulder for debridement of calcific tendonitis and open rotator cuff repair  11/06/19 11/11/2019  . Nontraumatic incomplete tear of right rotator cuff   . Myalgia due to statin 12/23/2018  . Pain in joint of left shoulder 07/17/2018  . BMI 30.0-30.9,adult 07/04/2018  . Low HDL (under 40) 08/01/2017  . Hyperlipidemia with target LDL less than 100 08/18/2015  . Internal hemorrhoids 08/18/2014  . Esophageal reflux 08/18/2014    Gabriela Eves, PT, DPT 01/13/2020, 1:05 PM  [facility] [address] [place], [place], [address] Phone: [phone]   Fax:  [phone]   Name: Laurie Kelley MRN: [id number] Date of Birth: [birth year]

## 2020-01-14 ENCOUNTER — Other Ambulatory Visit: Payer: Self-pay | Admitting: Orthopedic Surgery

## 2020-01-15 ENCOUNTER — Encounter: Payer: Self-pay | Admitting: Physical Therapy

## 2020-01-15 ENCOUNTER — Other Ambulatory Visit: Payer: Self-pay

## 2020-01-15 ENCOUNTER — Ambulatory Visit: Payer: Commercial Managed Care - PPO | Admitting: Physical Therapy

## 2020-01-15 DIAGNOSIS — M25511 Pain in right shoulder: Secondary | ICD-10-CM

## 2020-01-15 DIAGNOSIS — M25611 Stiffness of right shoulder, not elsewhere classified: Secondary | ICD-10-CM

## 2020-01-15 DIAGNOSIS — R600 Localized edema: Secondary | ICD-10-CM

## 2020-01-15 DIAGNOSIS — M6281 Muscle weakness (generalized): Secondary | ICD-10-CM

## 2020-01-15 NOTE — Therapy (Signed)
[facility] [place], [place], [address] Phone: [phone]   Fax:  [phone]  Physical Therapy Treatment  Patient Details  Name: Laurie Kelley MRN: [id number] Date of Birth: [birth year] Referring Provider (PT): Jorene Guest MD   Encounter Date: 01/15/2020   PT End of Session - 01/15/20 0827    Visit Number 18    Number of Visits 28    Date for PT Re-Evaluation 02/20/20    PT Start Time 0815    PT Stop Time 0908    PT Time Calculation (min) 53 min    Activity Tolerance Patient tolerated treatment well    Behavior During Therapy [facility] for tasks assessed/performed           Past Medical History:  Diagnosis Date  . Chest pain in adult 07/31/2017  . Esophageal reflux 08/18/2014  . Gallstones   . Hyperlipidemia with target LDL less than 100 08/18/2015  . Internal hemorrhoids 08/18/2014  . PONV (postoperative nausea and vomiting)     Past Surgical History:  Procedure Laterality Date  . CHOLECYSTECTOMY    . SHOULDER ARTHROSCOPY Right 11/06/2019   Procedure: ARTHROSCOPY RIGHT SHOULDER, DEBRIDEMENT X TWO STRUCTURES; OPEN ROTATOR CUFF REPAIR;  Surgeon: Carole Civil, MD;  Location: [facility];  Service: Orthopedics;  Laterality: Right;    There were no vitals filed for this visit.   Subjective Assessment - 01/15/20 0822    Subjective COVID-19 screening performed upon arrival. Reports 1/10 pain in right shoulder, takes an ibuprofen daily.    Pertinent History gall bladder removal, high cholesterol    Limitations House hold activities    Patient Stated Goals Use my arm without pain    Currently in Pain? Yes    Pain Score 1     Pain Location Shoulder    Pain Orientation Right    Pain Descriptors / Indicators Tightness    Pain Type Surgical pain    Pain Onset More than a month ago    Pain Frequency Constant              [facility] PT Assessment - 01/15/20 0001      Assessment   Medical Diagnosis R shoulder  debridement and rotator cuff repair    Referring Provider (PT) Jorene Guest MD    Onset Date/Surgical Date 11/06/19    Hand Dominance Right    Next MD Visit 01/12/2020    Prior Therapy yes for left shoulder last year      Precautions   Precaution Comments no abduction or active flexion, sling off on 12/04/2019      Restrictions   Weight Bearing Restrictions No                         [facility] Adult PT Treatment/Exercise - 01/15/20 0001      Shoulder Exercises: Pulleys   Flexion 5 minutes    Flexion Limitations in supine    Scaption 5 minutes    Scaption Limitations in supine      Vasopneumatic   Number Minutes Vasopneumatic  15 minutes    Vasopnuematic Location  Shoulder    Vasopneumatic Pressure Low    Vasopneumatic Temperature  34      Manual Therapy   Manual Therapy Passive ROM    Passive ROM PROM of R shoulder into flexion, ER, IR with gentle holds at end range  PT Long Term Goals - 01/06/20 0920      PT LONG TERM GOAL #1   Title Patient will be independent with HEP and progression.    Status Achieved      PT LONG TERM GOAL #2   Title Patient will report ability to perform basic ADL's with pain </= 3/10 in R shoulder.    Status On-going      PT LONG TERM GOAL #3   Title Patient will improve her R shoulder flexion to >/= 140 degrees in order to improve ability for  functional tasks.    Status On-going      PT LONG TERM GOAL #4   Title Pt will improve ER to >/= 45 degrees in order to improve functional mobility.    Status On-going                 Plan - 01/15/20 0908    Clinical Impression Statement Patient responded well to the increase of time with supine pulleys. Patient still with pulling pain in lateral shoulder aspect with PROM into ER. Visual fasciculations in lateral shoulder muscles noted with PROM. Patient with normal response to modalities.    Examination-Activity Limitations  Bathing;Carry;Dressing;Lift;Reach Overhead;Toileting    Examination-Participation Restrictions Other;Yard Work;Community Activity;Cleaning    Stability/Clinical Decision Making Stable/Uncomplicated    Clinical Decision Making Low    Rehab Potential Excellent    PT Frequency 2x / week    PT Duration 8 weeks    PT Treatment/Interventions ADLs/Self Care Home Management;Cryotherapy;Electrical Stimulation;Moist Heat;Ultrasound;Therapeutic activities;Therapeutic exercise;Neuromuscular re-education;Patient/family education;Manual techniques;Passive range of motion;Taping;Vasopneumatic Device    PT Next Visit Plan MD follow up Aug 10; continue pulleys in supine; PROM, Modalities As needed.    PT Home Exercise Plan pendulums, table slides    Consulted and Agree with Plan of Care Patient           Patient will benefit from skilled therapeutic intervention in order to improve the following deficits and impairments:  Pain, Postural dysfunction, Decreased strength, Decreased range of motion, Impaired UE functional use, Increased edema  Visit Diagnosis: Acute pain of right shoulder  Stiffness of right shoulder, not elsewhere classified  Muscle weakness (generalized)  Localized edema     Problem List Patient Active Problem List   Diagnosis Date Noted  . S/P arthroscopy of right shoulder for debridement of calcific tendonitis and open rotator cuff repair  11/06/19 11/11/2019  . Nontraumatic incomplete tear of right rotator cuff   . Myalgia due to statin 12/23/2018  . Pain in joint of left shoulder 07/17/2018  . BMI 30.0-30.9,adult 07/04/2018  . Low HDL (under 40) 08/01/2017  . Hyperlipidemia with target LDL less than 100 08/18/2015  . Internal hemorrhoids 08/18/2014  . Esophageal reflux 08/18/2014    Gabriela Eves, PT, DPT 01/15/2020, 9:11 AM  [facility] [place], [place], [address] Phone: [phone]   Fax:   [phone]  Name: Laurie Kelley MRN: [id number] Date of Birth: [birth year]

## 2020-01-20 ENCOUNTER — Ambulatory Visit (INDEPENDENT_AMBULATORY_CARE_PROVIDER_SITE_OTHER): Payer: Commercial Managed Care - PPO | Admitting: *Deleted

## 2020-01-20 ENCOUNTER — Other Ambulatory Visit: Payer: Self-pay

## 2020-01-20 ENCOUNTER — Encounter: Payer: Self-pay | Admitting: Physical Therapy

## 2020-01-20 ENCOUNTER — Ambulatory Visit: Payer: Commercial Managed Care - PPO | Attending: Orthopedic Surgery | Admitting: Physical Therapy

## 2020-01-20 DIAGNOSIS — Z3042 Encounter for surveillance of injectable contraceptive: Secondary | ICD-10-CM

## 2020-01-20 DIAGNOSIS — M6281 Muscle weakness (generalized): Secondary | ICD-10-CM | POA: Diagnosis present

## 2020-01-20 DIAGNOSIS — M25611 Stiffness of right shoulder, not elsewhere classified: Secondary | ICD-10-CM | POA: Diagnosis present

## 2020-01-20 DIAGNOSIS — R600 Localized edema: Secondary | ICD-10-CM | POA: Insufficient documentation

## 2020-01-20 DIAGNOSIS — M25511 Pain in right shoulder: Secondary | ICD-10-CM | POA: Diagnosis present

## 2020-01-20 NOTE — Therapy (Signed)
[facility] [place], [place], [address] Phone: [phone]   Fax:  [phone]  Physical Therapy Treatment  Patient Details  Name: Laurie Kelley MRN: [id number] Date of Birth: 1971-02-21 Referring Provider (PT): Jorene Guest MD   Encounter Date: 01/20/2020   PT End of Session - 01/20/20 0852    Visit Number 19    Number of Visits 28    Date for PT Re-Evaluation 02/20/20    PT Start Time 0815    PT Stop Time 0856    PT Time Calculation (min) 41 min    Activity Tolerance Patient tolerated treatment well    Behavior During Therapy [facility] for tasks assessed/performed           Past Medical History:  Diagnosis Date  . Chest pain in adult 07/31/2017  . Esophageal reflux 08/18/2014  . Gallstones   . Hyperlipidemia with target LDL less than 100 08/18/2015  . Internal hemorrhoids 08/18/2014  . PONV (postoperative nausea and vomiting)     Past Surgical History:  Procedure Laterality Date  . CHOLECYSTECTOMY    . SHOULDER ARTHROSCOPY Right 11/06/2019   Procedure: ARTHROSCOPY RIGHT SHOULDER, DEBRIDEMENT X TWO STRUCTURES; OPEN ROTATOR CUFF REPAIR;  Surgeon: Carole Civil, MD;  Location: [facility];  Service: Orthopedics;  Laterality: Right;    There were no vitals filed for this visit.   Subjective Assessment - 01/20/20 0852    Subjective COVID-19 screening performed upon arrival. Reports feeling better and was able to use her arm for light home activities with a little discomfort    Pertinent History gall bladder removal, high cholesterol    Limitations House hold activities    Patient Stated Goals Use my arm without pain    Currently in Pain? No/denies              [facility] PT Assessment - 01/20/20 0001      Assessment   Medical Diagnosis R shoulder debridement and rotator cuff repair    Referring Provider (PT) Jorene Guest MD    Onset Date/Surgical Date 11/06/19    Hand Dominance Right    Next MD Visit  01/27/2020    Prior Therapy yes for left shoulder last year      Restrictions   Weight Bearing Restrictions No                         [facility] Adult PT Treatment/Exercise - 01/20/20 0001      Shoulder Exercises: Supine   Protraction AROM;Right;10 reps    External Rotation AROM;Right;10 reps      Shoulder Exercises: Pulleys   Flexion 5 minutes    Flexion Limitations in supine    Scaption 5 minutes    Scaption Limitations in supine      Vasopneumatic   Number Minutes Vasopneumatic  10 minutes    Vasopnuematic Location  Shoulder    Vasopneumatic Pressure Low      Manual Therapy   Manual Therapy Passive ROM    Passive ROM PROM to R shoulder into ER; oscillations to decrease muscle guarding                       PT Long Term Goals - 01/06/20 0920      PT LONG TERM GOAL #1   Title Patient will be independent with HEP and progression.    Status Achieved      PT LONG TERM GOAL #2  Title Patient will report ability to perform basic ADL's with pain </= 3/10 in R shoulder.    Status On-going      PT LONG TERM GOAL #3   Title Patient will improve her R shoulder flexion to >/= 140 degrees in order to improve ability for  functional tasks.    Status On-going      PT LONG TERM GOAL #4   Title Pt will improve ER to >/= 45 degrees in order to improve functional mobility.    Status On-going                 Plan - 01/20/20 3614    Clinical Impression Statement Patient responded well to therapy session with the addition of supine protraction and IR/ER AROM. Patient still with significant guarding with PROM. Patient educated to perform AROM ER with arm at side to improve range. Patient reported understanding. Normal response to modalities upon removal.    Examination-Activity Limitations Bathing;Carry;Dressing;Lift;Reach Overhead;Toileting    Examination-Participation Restrictions Other;Yard Work;Community Activity;Cleaning    Stability/Clinical  Decision Making Stable/Uncomplicated    Clinical Decision Making Low    Rehab Potential Excellent    PT Frequency 2x / week    PT Duration 8 weeks    PT Treatment/Interventions ADLs/Self Care Home Management;Cryotherapy;Electrical Stimulation;Moist Heat;Ultrasound;Therapeutic activities;Therapeutic exercise;Neuromuscular re-education;Patient/family education;Manual techniques;Passive range of motion;Taping;Vasopneumatic Device    PT Next Visit Plan MD follow up Aug 10; continue pulleys in supine; PROM and AROM to tolerance Modalities As needed.    Consulted and Agree with Plan of Care Patient           Patient will benefit from skilled therapeutic intervention in order to improve the following deficits and impairments:  Pain, Postural dysfunction, Decreased strength, Decreased range of motion, Impaired UE functional use, Increased edema  Visit Diagnosis: Acute pain of right shoulder  Stiffness of right shoulder, not elsewhere classified  Muscle weakness (generalized)  Localized edema     Problem List Patient Active Problem List   Diagnosis Date Noted  . S/P arthroscopy of right shoulder for debridement of calcific tendonitis and open rotator cuff repair  11/06/19 11/11/2019  . Nontraumatic incomplete tear of right rotator cuff   . Myalgia due to statin 12/23/2018  . Pain in joint of left shoulder 07/17/2018  . BMI 30.0-30.9,adult 07/04/2018  . Low HDL (under 40) 08/01/2017  . Hyperlipidemia with target LDL less than 100 08/18/2015  . Internal hemorrhoids 08/18/2014  . Esophageal reflux 08/18/2014    Gabriela Eves, PT, DPT 01/20/2020, 9:51 AM  [facility] [address] [place], [place], [address] Phone: [phone]   Fax:  [phone]  Name: RASHEL OKEEFE MRN: [id number] Date of Birth: 10/21/1970

## 2020-01-22 ENCOUNTER — Ambulatory Visit: Payer: Commercial Managed Care - PPO | Admitting: Physical Therapy

## 2020-01-22 ENCOUNTER — Other Ambulatory Visit: Payer: Self-pay

## 2020-01-22 ENCOUNTER — Encounter: Payer: Self-pay | Admitting: Physical Therapy

## 2020-01-22 DIAGNOSIS — M25611 Stiffness of right shoulder, not elsewhere classified: Secondary | ICD-10-CM

## 2020-01-22 DIAGNOSIS — M25511 Pain in right shoulder: Secondary | ICD-10-CM | POA: Diagnosis not present

## 2020-01-22 DIAGNOSIS — M6281 Muscle weakness (generalized): Secondary | ICD-10-CM

## 2020-01-22 DIAGNOSIS — R600 Localized edema: Secondary | ICD-10-CM

## 2020-01-22 NOTE — Therapy (Signed)
[facility] [place], [place], [address] Phone: [phone]   Fax:  [phone]  Physical Therapy Treatment  Patient Details  Name: Laurie Kelley MRN: [id number] Date of Birth: [birth year] Referring Provider (PT): Jorene Guest MD   Encounter Date: 01/22/2020   PT End of Session - 01/22/20 0904    Visit Number 20    Number of Visits 28    Date for PT Re-Evaluation 02/20/20    PT Start Time 0815    PT Stop Time 0903    PT Time Calculation (min) 48 min    Activity Tolerance Patient tolerated treatment well    Behavior During Therapy [facility] for tasks assessed/performed           Past Medical History:  Diagnosis Date  . Chest pain in adult 07/31/2017  . Esophageal reflux 08/18/2014  . Gallstones   . Hyperlipidemia with target LDL less than 100 08/18/2015  . Internal hemorrhoids 08/18/2014  . PONV (postoperative nausea and vomiting)     Past Surgical History:  Procedure Laterality Date  . CHOLECYSTECTOMY    . SHOULDER ARTHROSCOPY Right 11/06/2019   Procedure: ARTHROSCOPY RIGHT SHOULDER, DEBRIDEMENT X TWO STRUCTURES; OPEN ROTATOR CUFF REPAIR;  Surgeon: Carole Civil, MD;  Location: [facility];  Service: Orthopedics;  Laterality: Right;    There were no vitals filed for this visit.   Subjective Assessment - 01/22/20 0820    Subjective COVID-19 screening performed upon arrival. Reports more soreness today due to HEP but doing alright.    Pertinent History gall bladder removal, high cholesterol    Limitations House hold activities    Patient Stated Goals Use my arm without pain    Currently in Pain? Yes    Pain Score 2     Pain Location Shoulder    Pain Orientation Right    Pain Descriptors / Indicators Sore    Pain Type Surgical pain    Pain Onset More than a month ago    Pain Frequency Constant              [facility] PT Assessment - 01/22/20 0001      Assessment   Medical Diagnosis R shoulder debridement and  rotator cuff repair    Referring Provider (PT) Jorene Guest MD    Onset Date/Surgical Date 11/06/19    Hand Dominance Right    Next MD Visit 01/27/2020    Prior Therapy yes for left shoulder last year      Restrictions   Weight Bearing Restrictions No                         [facility] Adult PT Treatment/Exercise - 01/22/20 0001      Shoulder Exercises: Supine   External Rotation AROM;Right;20 reps;10 reps    Diagonals AROM;10 reps      Shoulder Exercises: Pulleys   Flexion 5 minutes    Flexion Limitations in supine    Scaption 5 minutes    Scaption Limitations in supine      Vasopneumatic   Number Minutes Vasopneumatic  10 minutes    Vasopnuematic Location  Shoulder    Vasopneumatic Pressure Low    Vasopneumatic Temperature  34      Manual Therapy   Manual Therapy Passive ROM    Passive ROM PROM to R shoulder into ER, flexion, and horizontal abduction fot improve ROM; oscillations to decrease muscle guarding      Prosthetics  Prosthetic Care Comments                          PT Long Term Goals - 01/06/20 0920      PT LONG TERM GOAL #1   Title Patient will be independent with HEP and progression.    Status Achieved      PT LONG TERM GOAL #2   Title Patient will report ability to perform basic ADL's with pain </= 3/10 in R shoulder.    Status On-going      PT LONG TERM GOAL #3   Title Patient will improve her R shoulder flexion to >/= 140 degrees in order to improve ability for  functional tasks.    Status On-going      PT LONG TERM GOAL #4   Title Pt will improve ER to >/= 45 degrees in order to improve functional mobility.    Status On-going                 Plan - 01/22/20 0904    Clinical Impression Statement Patinet responded well to therapy session but with ongoing pain and soreness due to performing HEP. Patient still with increased guarding with ER but with slight improvements with range. Patient inquired whether her  elbow pain also contributes to the lack of ER. Patient and PT disussed changing hand placement next visit during ER PROM to see if that may reduce the torque on the elbow. Normal response to modalites upon removal.    Examination-Activity Limitations Bathing;Carry;Dressing;Lift;Reach Overhead;Toileting    Examination-Participation Restrictions Other;Yard Work;Community Activity;Cleaning    Stability/Clinical Decision Making Stable/Uncomplicated    Clinical Decision Making Low    Rehab Potential Excellent    PT Frequency 2x / week    PT Duration 8 weeks    PT Treatment/Interventions ADLs/Self Care Home Management;Cryotherapy;Electrical Stimulation;Moist Heat;Ultrasound;Therapeutic activities;Therapeutic exercise;Neuromuscular re-education;Patient/family education;Manual techniques;Passive range of motion;Taping;Vasopneumatic Device    PT Next Visit Plan Progress note & MD follow up Aug 10; continue pulleys in supine attempt in reclined position; PROM and AROM to tolerance Modalities As needed.    PT Home Exercise Plan pendulums, table slides    Consulted and Agree with Plan of Care Patient           Patient will benefit from skilled therapeutic intervention in order to improve the following deficits and impairments:  Pain, Postural dysfunction, Decreased strength, Decreased range of motion, Impaired UE functional use, Increased edema  Visit Diagnosis: Acute pain of right shoulder  Stiffness of right shoulder, not elsewhere classified  Muscle weakness (generalized)  Localized edema     Problem List Patient Active Problem List   Diagnosis Date Noted  . S/P arthroscopy of right shoulder for debridement of calcific tendonitis and open rotator cuff repair  11/06/19 11/11/2019  . Nontraumatic incomplete tear of right rotator cuff   . Myalgia due to statin 12/23/2018  . Pain in joint of left shoulder 07/17/2018  . BMI 30.0-30.9,adult 07/04/2018  . Low HDL (under 40) 08/01/2017  .  Hyperlipidemia with target LDL less than 100 08/18/2015  . Internal hemorrhoids 08/18/2014  . Esophageal reflux 08/18/2014    Gabriela Eves, PT, DPT 01/22/2020, 9:51 AM  [facility] [place], [place], [address] Phone: [phone]   Fax:  [phone]  Name: ZHURI KRASS MRN: [id number] Date of Birth: 1970-11-24

## 2020-01-27 ENCOUNTER — Ambulatory Visit: Payer: Commercial Managed Care - PPO | Admitting: Physical Therapy

## 2020-01-27 ENCOUNTER — Other Ambulatory Visit: Payer: Self-pay

## 2020-01-27 ENCOUNTER — Encounter: Payer: Self-pay | Admitting: Physical Therapy

## 2020-01-27 ENCOUNTER — Ambulatory Visit (INDEPENDENT_AMBULATORY_CARE_PROVIDER_SITE_OTHER): Payer: Commercial Managed Care - PPO | Admitting: Orthopedic Surgery

## 2020-01-27 ENCOUNTER — Encounter: Payer: Self-pay | Admitting: Orthopedic Surgery

## 2020-01-27 DIAGNOSIS — Z9889 Other specified postprocedural states: Secondary | ICD-10-CM

## 2020-01-27 DIAGNOSIS — M25611 Stiffness of right shoulder, not elsewhere classified: Secondary | ICD-10-CM

## 2020-01-27 DIAGNOSIS — M25511 Pain in right shoulder: Secondary | ICD-10-CM | POA: Diagnosis not present

## 2020-01-27 DIAGNOSIS — R600 Localized edema: Secondary | ICD-10-CM

## 2020-01-27 DIAGNOSIS — M6281 Muscle weakness (generalized): Secondary | ICD-10-CM

## 2020-01-27 NOTE — Therapy (Signed)
[facility] [place], [place], [address] Phone: [phone]   Fax:  [phone]  Physical Therapy Treatment Progress Note Reporting Period 12/18/2019 to 01/27/2020  See note below for Objective Data and Assessment of Progress/Goals. Slow improvements noted with PROM; improved tolerance to AAROM. Gabriela Eves, PT, DPT      Patient Details  Name: Laurie Kelley MRN: [id number] Date of Birth: 08-Oct-1970 Referring Provider (PT): Jorene Guest MD   Encounter Date: 01/27/2020   PT End of Session - 01/27/20 0831    Visit Number 21    Number of Visits 28    Date for PT Re-Evaluation 02/20/20    PT Start Time 0815    PT Stop Time 0908    PT Time Calculation (min) 53 min    Activity Tolerance Patient tolerated treatment well    Behavior During Therapy [facility] for tasks assessed/performed           Past Medical History:  Diagnosis Date  . Chest pain in adult 07/31/2017  . Esophageal reflux 08/18/2014  . Gallstones   . Hyperlipidemia with target LDL less than 100 08/18/2015  . Internal hemorrhoids 08/18/2014  . PONV (postoperative nausea and vomiting)     Past Surgical History:  Procedure Laterality Date  . CHOLECYSTECTOMY    . SHOULDER ARTHROSCOPY Right 11/06/2019   Procedure: ARTHROSCOPY RIGHT SHOULDER, DEBRIDEMENT X TWO STRUCTURES; OPEN ROTATOR CUFF REPAIR;  Surgeon: Carole Civil, MD;  Location: [facility];  Service: Orthopedics;  Laterality: Right;    There were no vitals filed for this visit.   Subjective Assessment - 01/27/20 0830    Subjective COVID-19 screening performed upon arrival. Reports no pain at rest upon arrival    Pertinent History gall bladder removal, high cholesterol    Limitations House hold activities    Patient Stated Goals Use my arm without pain    Currently in Pain? No/denies              [facility] PT Assessment - 01/27/20 0001      Assessment   Medical Diagnosis R shoulder  debridement and rotator cuff repair    Referring Provider (PT) Jorene Guest MD    Onset Date/Surgical Date 11/06/19    Hand Dominance Right    Next MD Visit 01/27/2020    Prior Therapy yes for left shoulder last year      PROM   Right Shoulder Flexion 137 Degrees    Right Shoulder ABduction 100 Degrees    Right Shoulder External Rotation 30 Degrees                         [facility] Adult PT Treatment/Exercise - 01/27/20 0001      Shoulder Exercises: Pulleys   Flexion 5 minutes    Flexion Limitations sitting with one wheel pulley    Scaption 5 minutes    Scaption Limitations sitting with one wheel pulley      Shoulder Exercises: ROM/Strengthening   Wall Wash into flexion x2 minutes     Other ROM/Strengthening Exercises seated clockwise and counter clockwise table circles x2 mins each      Vasopneumatic   Number Minutes Vasopneumatic  10 minutes    Vasopnuematic Location  Shoulder    Vasopneumatic Pressure Low    Vasopneumatic Temperature  34 for edema      Manual Therapy   Manual Therapy Passive ROM    Passive ROM PROM to R shoulder into ER,  flexion, and abduction improve ROM; oscillations to decrease muscle guarding                       PT Long Term Goals - 01/27/20 0941      PT LONG TERM GOAL #1   Title Patient will be independent with HEP and progression.    Time 8    Period Weeks    Status Achieved      PT LONG TERM GOAL #2   Title Patient will report ability to perform basic ADL's with pain </= 3/10 in R shoulder.    Time 8    Period Weeks    Status On-going      PT LONG TERM GOAL #3   Title Patient will improve her R shoulder flexion to >/= 140 degrees in order to improve ability for  functional tasks.    Baseline --    Time 8    Period Weeks    Status On-going      PT LONG TERM GOAL #4   Title Pt will improve ER to >/= 45 degrees in order to improve functional mobility.    Status On-going                 Plan -  01/27/20 0904    Clinical Impression Statement Patient responded fairly to the progression of  AAROM TEs with some increase of pain but tolerable. Patient noted with slight R shoulder abduction with standing wall washes into flexion, tactile cues provided to prevent it. Improved shoulder PROM but still limited, see measurements. Patient still reports pain at R elbow and lateral arm around deltoid region especially with PROM ER. Normal response to modalities upon removal.    Examination-Activity Limitations Bathing;Carry;Dressing;Lift;Reach Overhead;Toileting    Examination-Participation Restrictions Other;Yard Work;Community Activity;Cleaning    Stability/Clinical Decision Making Stable/Uncomplicated    Clinical Decision Making Low    Rehab Potential Excellent    PT Frequency 2x / week    PT Duration 8 weeks    PT Treatment/Interventions ADLs/Self Care Home Management;Cryotherapy;Electrical Stimulation;Moist Heat;Ultrasound;Therapeutic activities;Therapeutic exercise;Neuromuscular re-education;Patient/family education;Manual techniques;Passive range of motion;Taping;Vasopneumatic Device    PT Next Visit Plan 12 weeks post op 01/29/2020; continue pulleys in sitting progress AAROM as tolerable;  PROM and AROM to tolerance Modalities As needed.    Consulted and Agree with Plan of Care Patient           Patient will benefit from skilled therapeutic intervention in order to improve the following deficits and impairments:  Pain, Postural dysfunction, Decreased strength, Decreased range of motion, Impaired UE functional use, Increased edema  Visit Diagnosis: Acute pain of right shoulder  Stiffness of right shoulder, not elsewhere classified  Muscle weakness (generalized)  Localized edema     Problem List Patient Active Problem List   Diagnosis Date Noted  . S/P arthroscopy of right shoulder for debridement of calcific tendonitis and open rotator cuff repair  11/06/19 11/11/2019  .  Nontraumatic incomplete tear of right rotator cuff   . Myalgia due to statin 12/23/2018  . Pain in joint of left shoulder 07/17/2018  . BMI 30.0-30.9,adult 07/04/2018  . Low HDL (under 40) 08/01/2017  . Hyperlipidemia with target LDL less than 100 08/18/2015  . Internal hemorrhoids 08/18/2014  . Esophageal reflux 08/18/2014    Gabriela Eves, PT, DPT 01/27/2020, 9:44 AM  [facility] [address] [place], [place], [address] Phone: [phone]   Fax:  [phone]  Name: Quinita L  Eskin MRN: [id number] Date of Birth: 1970/10/03

## 2020-01-27 NOTE — Patient Instructions (Signed)
OOW TO SEPT 10

## 2020-01-27 NOTE — Progress Notes (Signed)
POST OP   Encounter Diagnosis  Name Primary?  . S/P arthroscopy of right shoulder for debridement of calcific tendonitis and open rotator cuff repair  11/06/19 Yes    49 year old female cuff repair May 20 almost at 3 months having difficulty with external rotation flexion abduction active and passive look good  She has improved  Recommend continue therapy come back in 4 weeks check motion  Light duty plan for September 10  Encounter Diagnosis  Name Primary?  . S/P arthroscopy of right shoulder for debridement of calcific tendonitis and open rotator cuff repair  11/06/19 Yes

## 2020-01-29 ENCOUNTER — Other Ambulatory Visit: Payer: Self-pay

## 2020-01-29 ENCOUNTER — Ambulatory Visit: Payer: Commercial Managed Care - PPO | Admitting: Physical Therapy

## 2020-01-29 DIAGNOSIS — M25511 Pain in right shoulder: Secondary | ICD-10-CM | POA: Diagnosis not present

## 2020-01-29 DIAGNOSIS — R600 Localized edema: Secondary | ICD-10-CM

## 2020-01-29 DIAGNOSIS — M6281 Muscle weakness (generalized): Secondary | ICD-10-CM

## 2020-01-29 DIAGNOSIS — M25611 Stiffness of right shoulder, not elsewhere classified: Secondary | ICD-10-CM

## 2020-01-29 NOTE — Therapy (Signed)
[facility] [place], [place], [address] Phone: [phone]   Fax:  [phone]  Physical Therapy Treatment  Patient Details  Name: Laurie Kelley MRN: [id number] Date of Birth: September 17, 1970 Referring Provider (PT): Jorene Guest MD   Encounter Date: 01/29/2020   PT End of Session - 01/29/20 0820    Visit Number 22    Number of Visits 28    Date for PT Re-Evaluation 02/20/20    PT Start Time 0815    PT Stop Time 0907    PT Time Calculation (min) 52 min    Activity Tolerance Patient tolerated treatment well    Behavior During Therapy [facility] for tasks assessed/performed           Past Medical History:  Diagnosis Date  . Chest pain in adult 07/31/2017  . Esophageal reflux 08/18/2014  . Gallstones   . Hyperlipidemia with target LDL less than 100 08/18/2015  . Internal hemorrhoids 08/18/2014  . PONV (postoperative nausea and vomiting)     Past Surgical History:  Procedure Laterality Date  . CHOLECYSTECTOMY    . SHOULDER ARTHROSCOPY Right 11/06/2019   Procedure: ARTHROSCOPY RIGHT SHOULDER, DEBRIDEMENT X TWO STRUCTURES; OPEN ROTATOR CUFF REPAIR;  Surgeon: Carole Civil, MD;  Location: [facility];  Service: Orthopedics;  Laterality: Right;    There were no vitals filed for this visit.       [facility] PT Assessment - 01/29/20 0001      Assessment   Medical Diagnosis R shoulder debridement and rotator cuff repair    Referring Provider (PT) Jorene Guest MD    Onset Date/Surgical Date 11/06/19    Hand Dominance Right    Next MD Visit 02/25/2020    Prior Therapy yes for left shoulder last year                         [facility] Adult PT Treatment/Exercise - 01/29/20 0001      Exercises   Exercises Shoulder      Shoulder Exercises: Pulleys   Flexion 5 minutes    Flexion Limitations sitting with one wheel pulley    Scaption 5 minutes    Scaption Limitations sitting with one wheel pulley      Shoulder  Exercises: ROM/Strengthening   Wall Wash into flexion, CW and CCW x20 each      Shoulder Exercises: Isometric Strengthening   Flexion 5X5"    Extension 5X5"    External Rotation 5X5"    Internal Rotation 5X5"    ABduction 5X5"    ADduction 5X5"      Vasopneumatic   Number Minutes Vasopneumatic  10 minutes    Vasopnuematic Location  Shoulder    Vasopneumatic Pressure Low    Vasopneumatic Temperature  34 for edema      Manual Therapy   Manual Therapy Passive ROM    Passive ROM PROM to R shoulder into ER with intermittent oscillations with 5 second holds                       PT Long Term Goals - 01/27/20 0941      PT LONG TERM GOAL #1   Title Patient will be independent with HEP and progression.    Time 8    Period Weeks    Status Achieved      PT LONG TERM GOAL #2   Title Patient will report ability to perform basic ADL's with  pain </= 3/10 in R shoulder.    Time 8    Period Weeks    Status On-going      PT LONG TERM GOAL #3   Title Patient will improve her R shoulder flexion to >/= 140 degrees in order to improve ability for  functional tasks.    Baseline --    Time 8    Period Weeks    Status On-going      PT LONG TERM GOAL #4   Title Pt will improve ER to >/= 45 degrees in order to improve functional mobility.    Status On-going                 Plan - 01/29/20 1038    Clinical Impression Statement Patient responded well to the addition of isometric TEs for right shoulder. Patient still with discomfort with wall wash more in CCW and Flexion than CW circles but patient able to complete full reps. Patient with less guarding with right shoulder  ER PROM but still with 5/10 pain. Patient stated she will see MD on 02/27/2020 and was instructed to call MD when she is close to visit 28 to discuss plan of care. Normal response to modalities upon removal.    Examination-Activity Limitations Bathing;Carry;Dressing;Lift;Reach Overhead;Toileting     Examination-Participation Restrictions Other;Yard Work;Community Activity;Cleaning    Stability/Clinical Decision Making Stable/Uncomplicated    Clinical Decision Making Low    Rehab Potential Excellent    PT Frequency 2x / week    PT Duration 8 weeks    PT Treatment/Interventions ADLs/Self Care Home Management;Cryotherapy;Electrical Stimulation;Moist Heat;Ultrasound;Therapeutic activities;Therapeutic exercise;Neuromuscular re-education;Patient/family education;Manual techniques;Passive range of motion;Taping;Vasopneumatic Device    PT Next Visit Plan 12 weeks post op 01/29/2020; continue pulleys in sitting progress AAROM as tolerable;  PROM and AROM to tolerance Modalities As needed.    PT Home Exercise Plan pendulums, table slides    Consulted and Agree with Plan of Care Patient           Patient will benefit from skilled therapeutic intervention in order to improve the following deficits and impairments:  Pain, Postural dysfunction, Decreased strength, Decreased range of motion, Impaired UE functional use, Increased edema  Visit Diagnosis: Stiffness of right shoulder, not elsewhere classified  Acute pain of right shoulder  Muscle weakness (generalized)  Localized edema     Problem List Patient Active Problem List   Diagnosis Date Noted  . S/P arthroscopy of right shoulder for debridement of calcific tendonitis and open rotator cuff repair  11/06/19 11/11/2019  . Nontraumatic incomplete tear of right rotator cuff   . Myalgia due to statin 12/23/2018  . Pain in joint of left shoulder 07/17/2018  . BMI 30.0-30.9,adult 07/04/2018  . Low HDL (under 40) 08/01/2017  . Hyperlipidemia with target LDL less than 100 08/18/2015  . Internal hemorrhoids 08/18/2014  . Esophageal reflux 08/18/2014    Gabriela Eves, PT, DPT 01/29/2020, 10:45 AM  [facility] [address] [place], [place], [address] Phone: [phone]   Fax:   [phone]  Name: Laurie Kelley MRN: [id number] Date of Birth: 05/17/1971

## 2020-02-03 ENCOUNTER — Encounter: Payer: Self-pay | Admitting: Physical Therapy

## 2020-02-03 ENCOUNTER — Ambulatory Visit: Payer: Commercial Managed Care - PPO | Admitting: Physical Therapy

## 2020-02-03 ENCOUNTER — Other Ambulatory Visit: Payer: Self-pay

## 2020-02-03 DIAGNOSIS — M6281 Muscle weakness (generalized): Secondary | ICD-10-CM

## 2020-02-03 DIAGNOSIS — M25611 Stiffness of right shoulder, not elsewhere classified: Secondary | ICD-10-CM

## 2020-02-03 DIAGNOSIS — R600 Localized edema: Secondary | ICD-10-CM

## 2020-02-03 DIAGNOSIS — M25511 Pain in right shoulder: Secondary | ICD-10-CM | POA: Diagnosis not present

## 2020-02-03 NOTE — Therapy (Signed)
[facility] [place], [place], [address] Phone: [phone]   Fax:  [phone]  Physical Therapy Treatment  Patient Details  Name: Laurie Kelley MRN: [id number] Date of Birth: 30-Aug-1970 Referring Provider (PT): Jorene Guest MD   Encounter Date: 02/03/2020   PT End of Session - 02/03/20 1034    Visit Number 23    Number of Visits 28    Date for PT Re-Evaluation 02/20/20    PT Start Time 1030    PT Stop Time 1116    PT Time Calculation (min) 46 min    Activity Tolerance Patient tolerated treatment well    Behavior During Therapy [facility] for tasks assessed/performed           Past Medical History:  Diagnosis Date  . Chest pain in adult 07/31/2017  . Esophageal reflux 08/18/2014  . Gallstones   . Hyperlipidemia with target LDL less than 100 08/18/2015  . Internal hemorrhoids 08/18/2014  . PONV (postoperative nausea and vomiting)     Past Surgical History:  Procedure Laterality Date  . CHOLECYSTECTOMY    . SHOULDER ARTHROSCOPY Right 11/06/2019   Procedure: ARTHROSCOPY RIGHT SHOULDER, DEBRIDEMENT X TWO STRUCTURES; OPEN ROTATOR CUFF REPAIR;  Surgeon: Carole Civil, MD;  Location: [facility];  Service: Orthopedics;  Laterality: Right;    There were no vitals filed for this visit.   Subjective Assessment - 02/03/20 1033    Subjective COVID-19 screening performed upon arrival. Reports doing alright, "elbow is cutting up today."    Pertinent History gall bladder removal, high cholesterol    Limitations House hold activities    Patient Stated Goals Use my arm without pain    Currently in Pain? Yes    Pain Score 2     Pain Location Shoulder    Pain Orientation Right    Pain Descriptors / Indicators Sore    Pain Type Surgical pain    Pain Onset More than a month ago    Pain Frequency Constant              [facility] PT Assessment - 02/03/20 0001      Assessment   Medical Diagnosis R shoulder debridement and  rotator cuff repair    Referring Provider (PT) Jorene Guest MD    Onset Date/Surgical Date 11/06/19    Hand Dominance Right    Next MD Visit 02/25/2020    Prior Therapy yes for left shoulder last year                         [facility] Adult PT Treatment/Exercise - 02/03/20 0001      Exercises   Exercises Shoulder      Shoulder Exercises: Pulleys   Flexion 5 minutes    Flexion Limitations sitting with one wheel pulley    Scaption 5 minutes    Scaption Limitations sitting with one wheel pulley      Shoulder Exercises: ROM/Strengthening   UBE (Upper Arm Bike) 120 RPM x6 mins (3' forward, 3' backward)    Ranger standing ranger flexion, CW, CCW circles x2 mins each      Vasopneumatic   Number Minutes Vasopneumatic  10 minutes    Vasopnuematic Location  Shoulder    Vasopneumatic Pressure Low    Vasopneumatic Temperature  34 for edema      Manual Therapy   Manual Therapy Passive ROM    Passive ROM PROM to R shoulder into ER with intermittent  oscillations with 5 second holds                       PT Long Term Goals - 01/27/20 0941      PT LONG TERM GOAL #1   Title Patient will be independent with HEP and progression.    Time 8    Period Weeks    Status Achieved      PT LONG TERM GOAL #2   Title Patient will report ability to perform basic ADL's with pain </= 3/10 in R shoulder.    Time 8    Period Weeks    Status On-going      PT LONG TERM GOAL #3   Title Patient will improve her R shoulder flexion to >/= 140 degrees in order to improve ability for  functional tasks.    Baseline --    Time 8    Period Weeks    Status On-going      PT LONG TERM GOAL #4   Title Pt will improve ER to >/= 45 degrees in order to improve functional mobility.    Status On-going                 Plan - 02/03/20 1208    Clinical Impression Statement Patient responded fairly well to therapy session with the addition of new TEs. Patient demonstrated good  form with all new TEs. R shoulder ER PROM still stiff. Contract relax attempted with some improvements of ROM but still with pain. Normal response to modalities upon removal.    Examination-Activity Limitations Bathing;Carry;Dressing;Lift;Reach Overhead;Toileting    Examination-Participation Restrictions Other;Yard Work;Community Activity;Cleaning    Stability/Clinical Decision Making Stable/Uncomplicated    Clinical Decision Making Low    Rehab Potential Excellent    PT Frequency 2x / week    PT Duration 8 weeks    PT Treatment/Interventions ADLs/Self Care Home Management;Cryotherapy;Electrical Stimulation;Moist Heat;Ultrasound;Therapeutic activities;Therapeutic exercise;Neuromuscular re-education;Patient/family education;Manual techniques;Passive range of motion;Taping;Vasopneumatic Device    PT Next Visit Plan 12 weeks post op 01/29/2020; continue pulleys in sitting progress AAROM as tolerable;  PROM and AROM to tolerance Modalities As needed.    PT Home Exercise Plan pendulums, table slides    Consulted and Agree with Plan of Care Patient           Patient will benefit from skilled therapeutic intervention in order to improve the following deficits and impairments:  Pain, Postural dysfunction, Decreased strength, Decreased range of motion, Impaired UE functional use, Increased edema  Visit Diagnosis: Stiffness of right shoulder, not elsewhere classified  Acute pain of right shoulder  Muscle weakness (generalized)  Localized edema     Problem List Patient Active Problem List   Diagnosis Date Noted  . S/P arthroscopy of right shoulder for debridement of calcific tendonitis and open rotator cuff repair  11/06/19 11/11/2019  . Nontraumatic incomplete tear of right rotator cuff   . Myalgia due to statin 12/23/2018  . Pain in joint of left shoulder 07/17/2018  . BMI 30.0-30.9,adult 07/04/2018  . Low HDL (under 40) 08/01/2017  . Hyperlipidemia with target LDL less than 100  08/18/2015  . Internal hemorrhoids 08/18/2014  . Esophageal reflux 08/18/2014    Gabriela Eves, PT, DPT 02/03/2020, 12:19 PM  [facility] [address] [place], [place], [address] Phone: [phone]   Fax:  [phone]  Name: Laurie Kelley MRN: [id number] Date of Birth: 09/15/1970

## 2020-02-05 ENCOUNTER — Ambulatory Visit: Payer: Commercial Managed Care - PPO | Admitting: Physical Therapy

## 2020-02-05 ENCOUNTER — Encounter: Payer: Self-pay | Admitting: Physical Therapy

## 2020-02-05 ENCOUNTER — Other Ambulatory Visit: Payer: Self-pay

## 2020-02-05 DIAGNOSIS — M25511 Pain in right shoulder: Secondary | ICD-10-CM

## 2020-02-05 DIAGNOSIS — M6281 Muscle weakness (generalized): Secondary | ICD-10-CM

## 2020-02-05 DIAGNOSIS — R600 Localized edema: Secondary | ICD-10-CM

## 2020-02-05 DIAGNOSIS — M25611 Stiffness of right shoulder, not elsewhere classified: Secondary | ICD-10-CM

## 2020-02-05 NOTE — Therapy (Signed)
[facility] [place], [place], [address] Phone: [phone]   Fax:  [phone]  Physical Therapy Treatment  Patient Details  Name: Laurie Kelley MRN: [id number] Date of Birth: 1971-05-10 Referring Provider (PT): Jorene Guest MD   Encounter Date: 02/05/2020   PT End of Session - 02/05/20 0959    Visit Number 24    Number of Visits 28    Date for PT Re-Evaluation 02/20/20    PT Start Time 0945    PT Stop Time 1032    PT Time Calculation (min) 47 min    Activity Tolerance Patient tolerated treatment well    Behavior During Therapy [facility] for tasks assessed/performed           Past Medical History:  Diagnosis Date  . Chest pain in adult 07/31/2017  . Esophageal reflux 08/18/2014  . Gallstones   . Hyperlipidemia with target LDL less than 100 08/18/2015  . Internal hemorrhoids 08/18/2014  . PONV (postoperative nausea and vomiting)     Past Surgical History:  Procedure Laterality Date  . CHOLECYSTECTOMY    . SHOULDER ARTHROSCOPY Right 11/06/2019   Procedure: ARTHROSCOPY RIGHT SHOULDER, DEBRIDEMENT X TWO STRUCTURES; OPEN ROTATOR CUFF REPAIR;  Surgeon: Carole Civil, MD;  Location: [facility];  Service: Orthopedics;  Laterality: Right;    There were no vitals filed for this visit.   Subjective Assessment - 02/05/20 0958    Subjective COVID-19 screening performed upon arrival. Reports ongoing elbow pain, shoulder is about 1/10. States she is to return to work on 8/27/202 on light duty.    Pertinent History gall bladder removal, high cholesterol    Limitations House hold activities    Patient Stated Goals Use my arm without pain    Currently in Pain? Yes    Pain Score 1     Pain Location Shoulder    Pain Orientation Right    Pain Descriptors / Indicators Sore    Pain Type Surgical pain    Pain Onset More than a month ago    Pain Frequency Constant              [facility] PT Assessment - 02/05/20 0001       Assessment   Medical Diagnosis R shoulder debridement and rotator cuff repair    Referring Provider (PT) Jorene Guest MD    Onset Date/Surgical Date 11/06/19    Hand Dominance Right    Next MD Visit 02/25/2020    Prior Therapy yes for left shoulder last year                         [facility] Adult PT Treatment/Exercise - 02/05/20 0001      Exercises   Exercises Shoulder      Shoulder Exercises: Standing   Flexion AROM;Right;20 reps    Row Strengthening;Both;20 reps   Green XTS     Shoulder Exercises: Pulleys   Flexion 5 minutes    Flexion Limitations sitting with one wheel pulley    Scaption 5 minutes    Scaption Limitations sitting with one wheel pulley      Shoulder Exercises: ROM/Strengthening   UBE (Upper Arm Bike) 120 RPM x6 mins (3' forward, 3' backward)    Ranger standing ranger flexion, CW, CCW circles x2 mins each      Shoulder Exercises: IT sales professional 20 seconds;3 reps      Vasopneumatic   Number Minutes Vasopneumatic  10  minutes    Vasopnuematic Location  Shoulder    Vasopneumatic Pressure Low    Vasopneumatic Temperature  34 for edema      Manual Therapy   Manual Therapy --    Passive ROM --                       PT Long Term Goals - 01/27/20 0941      PT LONG TERM GOAL #1   Title Patient will be independent with HEP and progression.    Time 8    Period Weeks    Status Achieved      PT LONG TERM GOAL #2   Title Patient will report ability to perform basic ADL's with pain </= 3/10 in R shoulder.    Time 8    Period Weeks    Status On-going      PT LONG TERM GOAL #3   Title Patient will improve her R shoulder flexion to >/= 140 degrees in order to improve ability for  functional tasks.    Baseline --    Time 8    Period Weeks    Status On-going      PT LONG TERM GOAL #4   Title Pt will improve ER to >/= 45 degrees in order to improve functional mobility.    Status On-going                  Plan - 02/05/20 1003    Clinical Impression Statement Patient responded well to therapy session. Verbal cuing provided to prevent shoulder abduction with forward flexion into cabinet. Improved form for remaining reps. Postural strengthening initiated with good response. No adverse effects upon removal of modalities.    Examination-Activity Limitations Bathing;Carry;Dressing;Lift;Reach Overhead;Toileting    Examination-Participation Restrictions Other;Yard Work;Community Activity;Cleaning    Stability/Clinical Decision Making Stable/Uncomplicated    Clinical Decision Making Low    Rehab Potential Excellent    PT Frequency 2x / week    PT Duration 8 weeks    PT Treatment/Interventions ADLs/Self Care Home Management;Cryotherapy;Electrical Stimulation;Moist Heat;Ultrasound;Therapeutic activities;Therapeutic exercise;Neuromuscular re-education;Patient/family education;Manual techniques;Passive range of motion;Taping;Vasopneumatic Device    PT Next Visit Plan 12 weeks post op 01/29/2020; continue pulleys in sitting progress AAROM as tolerable;  PROM and AROM to tolerance Modalities As needed.    PT Home Exercise Plan pendulums, table slides    Consulted and Agree with Plan of Care Patient           Patient will benefit from skilled therapeutic intervention in order to improve the following deficits and impairments:  Pain, Postural dysfunction, Decreased strength, Decreased range of motion, Impaired UE functional use, Increased edema  Visit Diagnosis: Stiffness of right shoulder, not elsewhere classified  Acute pain of right shoulder  Muscle weakness (generalized)  Localized edema     Problem List Patient Active Problem List   Diagnosis Date Noted  . S/P arthroscopy of right shoulder for debridement of calcific tendonitis and open rotator cuff repair  11/06/19 11/11/2019  . Nontraumatic incomplete tear of right rotator cuff   . Myalgia due to statin 12/23/2018  . Pain in joint of left  shoulder 07/17/2018  . BMI 30.0-30.9,adult 07/04/2018  . Low HDL (under 40) 08/01/2017  . Hyperlipidemia with target LDL less than 100 08/18/2015  . Internal hemorrhoids 08/18/2014  . Esophageal reflux 08/18/2014    Gabriela Eves, PT, DPT 02/05/2020, 11:22 AM  [facility] [place], [place], [address] Phone:  [phone]   Fax:  [phone]  Name: Laurie Kelley MRN: [id number] Date of Birth: 01/07/1971

## 2020-02-10 ENCOUNTER — Other Ambulatory Visit: Payer: Self-pay

## 2020-02-10 ENCOUNTER — Ambulatory Visit: Payer: Commercial Managed Care - PPO | Admitting: Physical Therapy

## 2020-02-10 DIAGNOSIS — R600 Localized edema: Secondary | ICD-10-CM

## 2020-02-10 DIAGNOSIS — M25511 Pain in right shoulder: Secondary | ICD-10-CM | POA: Diagnosis not present

## 2020-02-10 DIAGNOSIS — M25611 Stiffness of right shoulder, not elsewhere classified: Secondary | ICD-10-CM

## 2020-02-10 DIAGNOSIS — M6281 Muscle weakness (generalized): Secondary | ICD-10-CM

## 2020-02-10 NOTE — Therapy (Signed)
[facility] [place], [place], [address] Phone: [phone]   Fax:  [phone]  Physical Therapy Treatment  Patient Details  Name: Laurie Kelley MRN: [id number] Date of Birth: 03/02/1971 Referring Provider (PT): Jorene Guest MD   Encounter Date: 02/10/2020   PT End of Session - 02/10/20 0911    Visit Number 25    Number of Visits 28    Date for PT Re-Evaluation 02/20/20    PT Start Time 0815    PT Stop Time 0910    PT Time Calculation (min) 55 min    Activity Tolerance Patient tolerated treatment well    Behavior During Therapy [facility] for tasks assessed/performed           Past Medical History:  Diagnosis Date  . Chest pain in adult 07/31/2017  . Esophageal reflux 08/18/2014  . Gallstones   . Hyperlipidemia with target LDL less than 100 08/18/2015  . Internal hemorrhoids 08/18/2014  . PONV (postoperative nausea and vomiting)     Past Surgical History:  Procedure Laterality Date  . CHOLECYSTECTOMY    . SHOULDER ARTHROSCOPY Right 11/06/2019   Procedure: ARTHROSCOPY RIGHT SHOULDER, DEBRIDEMENT X TWO STRUCTURES; OPEN ROTATOR CUFF REPAIR;  Surgeon: Carole Civil, MD;  Location: [facility];  Service: Orthopedics;  Laterality: Right;    There were no vitals filed for this visit.   Subjective Assessment - 02/10/20 0907    Subjective COVID-19 screening performed upon arrival. Reports more pain today in R shoulder and states it feels tight in delotid region. States only thing she did differently over the weekend is drive to and from [place], about 45 mins each way.    Pertinent History gall bladder removal, high cholesterol    Limitations House hold activities    Patient Stated Goals Use my arm without pain    Currently in Pain? Yes    Pain Score 3     Pain Location Shoulder    Pain Orientation Right;Lateral    Pain Descriptors / Indicators Sore;Tightness    Pain Type Surgical pain              [facility] PT  Assessment - 02/10/20 0001      Assessment   Medical Diagnosis R shoulder debridement and rotator cuff repair    Referring Provider (PT) Jorene Guest MD    Onset Date/Surgical Date 11/06/19    Hand Dominance Right    Next MD Visit 02/25/2020    Prior Therapy yes for left shoulder last year                         [facility] Adult PT Treatment/Exercise - 02/10/20 0001      Exercises   Exercises Shoulder      Shoulder Exercises: Standing   Flexion AROM;Right;20 reps   1x20 to bottom shelf and middle shelf   Extension Strengthening;Both;20 reps;Theraband   blue XTS   Row Strengthening;Both;20 reps   blue XTS     Shoulder Exercises: Pulleys   Flexion 5 minutes    Flexion Limitations sitting with one wheel pulley    Scaption 5 minutes    Scaption Limitations sitting with one wheel pulley      Shoulder Exercises: ROM/Strengthening   UBE (Upper Arm Bike) 120 RPM x8 mins (4' forward, 4' backward)      Vasopneumatic   Number Minutes Vasopneumatic  10 minutes    Vasopnuematic Location  Shoulder    Vasopneumatic  Pressure Low    Vasopneumatic Temperature  34 for edema      Manual Therapy   Manual Therapy Passive ROM    Passive ROM PROM to R shoulder into ER with intermittent oscillations with 5 second holds                       PT Long Term Goals - 02/10/20 0919      PT LONG TERM GOAL #1   Title Patient will be independent with HEP and progression.    Time 8    Period Weeks    Status Achieved      PT LONG TERM GOAL #2   Title Patient will report ability to perform basic ADL's with pain </= 3/10 in R shoulder.    Time 8    Period Weeks    Status On-going      PT LONG TERM GOAL #3   Title Patient will improve her R shoulder flexion to >/= 140 degrees in order to improve ability for  functional tasks.    Time 8    Period Weeks    Status On-going      PT LONG TERM GOAL #4   Title Pt will improve ER to >/= 45 degrees in order to improve  functional mobility.    Status On-going                 Plan - 02/10/20 0915    Clinical Impression Statement Patient was able to tolerate treatment fairly today with more discomfort in R shoulder but able to complete TEs. Patient noted with increased guarding while ambulating around clinic. Minimal tone noted upon palpation of R delotid region but did state increased tenderness upon palpation. ER PROM measured at 36 degrees today. No adverse effects upon removal of modalities.    Examination-Activity Limitations Bathing;Carry;Dressing;Lift;Reach Overhead;Toileting    Examination-Participation Restrictions Other;Yard Work;Community Activity;Cleaning    Stability/Clinical Decision Making Stable/Uncomplicated    Clinical Decision Making Low    Rehab Potential Excellent    PT Frequency 2x / week    PT Duration 8 weeks    PT Treatment/Interventions ADLs/Self Care Home Management;Cryotherapy;Electrical Stimulation;Moist Heat;Ultrasound;Therapeutic activities;Therapeutic exercise;Neuromuscular re-education;Patient/family education;Manual techniques;Passive range of motion;Taping;Vasopneumatic Device    PT Next Visit Plan 12 weeks post op 01/29/2020; continue pulleys in sitting progress AAROM as tolerable;  PROM and AROM to tolerance Modalities As needed.    Consulted and Agree with Plan of Care Patient           Patient will benefit from skilled therapeutic intervention in order to improve the following deficits and impairments:  Pain, Postural dysfunction, Decreased strength, Decreased range of motion, Impaired UE functional use, Increased edema  Visit Diagnosis: Stiffness of right shoulder, not elsewhere classified  Acute pain of right shoulder  Muscle weakness (generalized)  Localized edema     Problem List Patient Active Problem List   Diagnosis Date Noted  . S/P arthroscopy of right shoulder for debridement of calcific tendonitis and open rotator cuff repair  11/06/19  11/11/2019  . Nontraumatic incomplete tear of right rotator cuff   . Myalgia due to statin 12/23/2018  . Pain in joint of left shoulder 07/17/2018  . BMI 30.0-30.9,adult 07/04/2018  . Low HDL (under 40) 08/01/2017  . Hyperlipidemia with target LDL less than 100 08/18/2015  . Internal hemorrhoids 08/18/2014  . Esophageal reflux 08/18/2014    Gabriela Eves, PT, DPT 02/10/2020, 9:21 AM  [facility]  [place], [place], [address] Phone: [phone]   Fax:  [phone]  Name: Laurie Kelley MRN: [id number] Date of Birth: 1971/05/19

## 2020-02-11 ENCOUNTER — Ambulatory Visit (INDEPENDENT_AMBULATORY_CARE_PROVIDER_SITE_OTHER): Payer: Commercial Managed Care - PPO | Admitting: Orthopedic Surgery

## 2020-02-11 ENCOUNTER — Other Ambulatory Visit: Payer: Self-pay | Admitting: Orthopedic Surgery

## 2020-02-11 ENCOUNTER — Encounter: Payer: Self-pay | Admitting: Orthopedic Surgery

## 2020-02-11 DIAGNOSIS — Z9889 Other specified postprocedural states: Secondary | ICD-10-CM

## 2020-02-11 NOTE — Progress Notes (Signed)
Chief Complaint  Patient presents with  . Routine Post Op    11/06/19 right shoulder scope     Laurie Kelley will continue with physical therapy follow-up with me in 4 weeks  She can return to work and keep her restrictions until April 18, 2020  She is off of her hydrocodone she can take ibuprofen in the morning and tizanidine meloxicam as needed basis  Encounter Diagnosis  Name Primary?  . S/P arthroscopy of right shoulder for debridement of calcific tendonitis and open rotator cuff repair  11/06/19 Yes    Laurie Kelley is 49 years old she is 3 months after her debridement of a calcific tendinitis and rotator cuff repair through mini incision still comes in with significant muscle guarding  Passive motion 150 degrees flexion 45 degrees external rotation with active abduction 85 degrees and flexion 110 degrees with significant amount of scapular substitution and rhomboid and parascapular muscle dysfunction  Recommend continue physical therapy

## 2020-02-12 ENCOUNTER — Encounter: Payer: Self-pay | Admitting: Physical Therapy

## 2020-02-12 ENCOUNTER — Ambulatory Visit: Payer: Commercial Managed Care - PPO | Admitting: Physical Therapy

## 2020-02-12 ENCOUNTER — Other Ambulatory Visit: Payer: Self-pay | Admitting: Orthopedic Surgery

## 2020-02-12 ENCOUNTER — Other Ambulatory Visit: Payer: Self-pay

## 2020-02-12 DIAGNOSIS — R600 Localized edema: Secondary | ICD-10-CM

## 2020-02-12 DIAGNOSIS — M6281 Muscle weakness (generalized): Secondary | ICD-10-CM

## 2020-02-12 DIAGNOSIS — M25511 Pain in right shoulder: Secondary | ICD-10-CM

## 2020-02-12 DIAGNOSIS — M25611 Stiffness of right shoulder, not elsewhere classified: Secondary | ICD-10-CM

## 2020-02-12 MED ORDER — TIZANIDINE HCL 4 MG PO TABS: ORAL_TABLET | ORAL | 1 refills | Status: DC

## 2020-02-12 NOTE — Telephone Encounter (Signed)
Rx request, please advise.

## 2020-02-12 NOTE — Progress Notes (Signed)
Meds ordered this encounter  Medications  . tiZANidine (ZANAFLEX) 4 MG tablet    Sig: TAKE 1 TABLET (4 MG TOTAL) BY MOUTH DAILY.    Dispense:  30 tablet    Refill:  1

## 2020-02-12 NOTE — Therapy (Signed)
[facility] [place], [place], [address] Phone: [phone]   Fax:  [phone]  Physical Therapy Treatment  Patient Details  Name: Laurie Kelley MRN: [id number] Date of Birth: [birth year] Referring Provider (PT): Jorene Guest MD   Encounter Date: 02/12/2020   PT End of Session - 02/12/20 0817    Visit Number 26    Number of Visits 28    Date for PT Re-Evaluation 02/20/20    PT Start Time 0815    PT Stop Time 0908    PT Time Calculation (min) 53 min    Activity Tolerance Patient tolerated treatment well    Behavior During Therapy [facility] for tasks assessed/performed           Past Medical History:  Diagnosis Date  . Chest pain in adult 07/31/2017  . Esophageal reflux 08/18/2014  . Gallstones   . Hyperlipidemia with target LDL less than 100 08/18/2015  . Internal hemorrhoids 08/18/2014  . PONV (postoperative nausea and vomiting)     Past Surgical History:  Procedure Laterality Date  . CHOLECYSTECTOMY    . SHOULDER ARTHROSCOPY Right 11/06/2019   Procedure: ARTHROSCOPY RIGHT SHOULDER, DEBRIDEMENT X TWO STRUCTURES; OPEN ROTATOR CUFF REPAIR;  Surgeon: Carole Civil, MD;  Location: [facility];  Service: Orthopedics;  Laterality: Right;    There were no vitals filed for this visit.   Subjective Assessment - 02/12/20 0816    Subjective COVID 19 screening performed on patient upon arrival. Patient reports she is waiting to hear from HR when she can return to work. 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    Pertinent History gall bladder removal, high cholesterol    Limitations House hold activities    Patient Stated Goals Use my arm without pain    Currently in Pain? Yes    Pain Score 2     Pain Location Shoulder    Pain Orientation Right;Lateral    Pain Descriptors / Indicators Sore    Pain Type Surgical pain    Pain Onset More than a month ago    Pain Frequency Constant              [facility]  PT Assessment - 02/12/20 0001      Assessment   Medical Diagnosis R shoulder debridement and rotator cuff repair    Referring Provider (PT) Jorene Guest MD    Onset Date/Surgical Date 11/06/19    Hand Dominance Right    Next MD Visit 03/11/2020    Prior Therapy yes for left shoulder last year      Restrictions   Weight Bearing Restrictions No      ROM / Strength   AROM / PROM / Strength PROM      AROM   Overall AROM  --    AROM Assessment Site --      PROM   Overall PROM  Deficits    Overall PROM Comments less anterior shoulder pull with PROM flexion    PROM Assessment Site Shoulder    Right/Left Shoulder Right    Right Shoulder Flexion 122 Degrees    Right Shoulder Internal Rotation 65 Degrees    Right Shoulder External Rotation 30 Degrees                         [facility] Adult PT Treatment/Exercise - 02/12/20 0001      Shoulder Exercises: Standing   Flexion AROM;Right;20 reps  to low and mid shelf each   Extension Strengthening;Right;20 reps;Theraband    Theraband Level (Shoulder Extension) Level 4 (Blue)    Row Strengthening;Right;20 reps;Theraband    Theraband Level (Shoulder Row) Level 4 (Blue)    Other Standing Exercises AAROM IR, ext x15 reps      Shoulder Exercises: Pulleys   Flexion 5 minutes    Flexion Limitations sitting with two wheel pulley    Scaption 5 minutes    Scaption Limitations sitting with two wheel pulley      Shoulder Exercises: ROM/Strengthening   UBE (Upper Arm Bike) 90 RPM x8 mins (4' forward, 4' backward)      Modalities   Modalities Vasopneumatic      Vasopneumatic   Number Minutes Vasopneumatic  10 minutes    Vasopnuematic Location  Shoulder    Vasopneumatic Pressure Low    Vasopneumatic Temperature  34/pain and edema      Manual Therapy   Manual Therapy Passive ROM    Passive ROM PROM to R shoulder into flex, ER, IR with holds at end range                       PT Long Term Goals - 02/10/20  0919      PT LONG TERM GOAL #1   Title Patient will be independent with HEP and progression.    Time 8    Period Weeks    Status Achieved      PT LONG TERM GOAL #2   Title Patient will report ability to perform basic ADL's with pain </= 3/10 in R shoulder.    Time 8    Period Weeks    Status On-going      PT LONG TERM GOAL #3   Title Patient will improve her R shoulder flexion to >/= 140 degrees in order to improve ability for  functional tasks.    Time 8    Period Weeks    Status On-going      PT LONG TERM GOAL #4   Title Pt will improve ER to >/= 45 degrees in order to improve functional mobility.    Status On-going                  Patient will benefit from skilled therapeutic intervention in order to improve the following deficits and impairments:     Visit Diagnosis: Stiffness of right shoulder, not elsewhere classified  Acute pain of right shoulder  Muscle weakness (generalized)  Localized edema     Problem List Patient Active Problem List   Diagnosis Date Noted  . S/P arthroscopy of right shoulder for debridement of calcific tendonitis and open rotator cuff repair  11/06/19 11/11/2019  . Nontraumatic incomplete tear of right rotator cuff   . Myalgia due to statin 12/23/2018  . Pain in joint of left shoulder 07/17/2018  . BMI 30.0-30.9,adult 07/04/2018  . Low HDL (under 40) 08/01/2017  . Hyperlipidemia with target LDL less than 100 08/18/2015  . Internal hemorrhoids 08/18/2014  . Esophageal reflux 08/18/2014    Standley Brooking, PTA 02/12/2020, 9:19 AM  [facility] [address] [place], [place], [address] Phone: [phone]   Fax:  [phone]  Name: HARVEST DEIST MRN: [id number] Date of Birth: 11-11-1970

## 2020-02-17 ENCOUNTER — Ambulatory Visit: Payer: Commercial Managed Care - PPO | Admitting: Physical Therapy

## 2020-02-17 ENCOUNTER — Other Ambulatory Visit: Payer: Self-pay

## 2020-02-17 ENCOUNTER — Encounter: Payer: Self-pay | Admitting: Physical Therapy

## 2020-02-17 DIAGNOSIS — M25511 Pain in right shoulder: Secondary | ICD-10-CM | POA: Diagnosis not present

## 2020-02-17 DIAGNOSIS — M6281 Muscle weakness (generalized): Secondary | ICD-10-CM

## 2020-02-17 DIAGNOSIS — M25611 Stiffness of right shoulder, not elsewhere classified: Secondary | ICD-10-CM

## 2020-02-17 DIAGNOSIS — R600 Localized edema: Secondary | ICD-10-CM

## 2020-02-17 NOTE — Therapy (Signed)
[facility] [place], [place], [address] Phone: [phone]   Fax:  [phone]  Physical Therapy Treatment  Patient Details  Name: Laurie Kelley MRN: [id number] Date of Birth: [birth year] Referring Provider (PT): Jorene Guest MD   Encounter Date: 02/17/2020   PT End of Session - 02/17/20 0828    Visit Number 27    Number of Visits 28    Date for PT Re-Evaluation 02/20/20    PT Start Time 0817    PT Stop Time 0903    PT Time Calculation (min) 46 min    Activity Tolerance Patient tolerated treatment well    Behavior During Therapy [facility] for tasks assessed/performed           Past Medical History:  Diagnosis Date  . Chest pain in adult 07/31/2017  . Esophageal reflux 08/18/2014  . Gallstones   . Hyperlipidemia with target LDL less than 100 08/18/2015  . Internal hemorrhoids 08/18/2014  . PONV (postoperative nausea and vomiting)     Past Surgical History:  Procedure Laterality Date  . CHOLECYSTECTOMY    . SHOULDER ARTHROSCOPY Right 11/06/2019   Procedure: ARTHROSCOPY RIGHT SHOULDER, DEBRIDEMENT X TWO STRUCTURES; OPEN ROTATOR CUFF REPAIR;  Surgeon: Carole Civil, MD;  Location: [facility];  Service: Orthopedics;  Laterality: Right;    There were no vitals filed for this visit.   Subjective Assessment - 02/17/20 0827    Subjective COVID 19 screening performed on patient upon arrival. Patient reports that she is not returning to work as she does not have the proper response time to do the job that was asked of her.    Pertinent History gall bladder removal, high cholesterol    Limitations House hold activities    Patient Stated Goals Use my arm without pain    Currently in Pain? Other (Comment)   Reporting shoulder stiffness             [facility] PT Assessment - 02/17/20 0001      Assessment   Medical Diagnosis R shoulder debridement and rotator cuff repair    Referring Provider (PT) Jorene Guest MD     Onset Date/Surgical Date 11/06/19    Hand Dominance Right    Next MD Visit 03/11/2020    Prior Therapy yes for left shoulder last year      Restrictions   Weight Bearing Restrictions No                         [facility] Adult PT Treatment/Exercise - 02/17/20 0001      Shoulder Exercises: Standing   Flexion AROM;AAROM;Right;20 reps    Flexion Limitations x20 reps AROM, x20 reps AAROM     Extension Strengthening;Right;20 reps;Theraband    Extension Limitations [employer];Theraband    Row Limitations Green XTS    Other Standing Exercises AAROM IR, ext x15 reps      Shoulder Exercises: Pulleys   Flexion 5 minutes    Flexion Limitations sitting with two wheel pulley    Scaption 5 minutes    Scaption Limitations sitting with two wheel pulley      Shoulder Exercises: ROM/Strengthening   UBE (Upper Arm Bike) 90 RPM x6 mins (3' forward, 3' backward)      Modalities   Modalities Vasopneumatic      Vasopneumatic   Number Minutes Vasopneumatic  10 minutes    Vasopnuematic Location  Shoulder  Vasopneumatic Pressure Low    Vasopneumatic Temperature  34/pain and edema      Manual Therapy   Manual Therapy Passive ROM    Passive ROM PROM to R shoulder into flex, ER, IR with holds at end range                       PT Long Term Goals - 02/10/20 0919      PT LONG TERM GOAL #1   Title Patient will be independent with HEP and progression.    Time 8    Period Weeks    Status Achieved      PT LONG TERM GOAL #2   Title Patient will report ability to perform basic ADL's with pain </= 3/10 in R shoulder.    Time 8    Period Weeks    Status On-going      PT LONG TERM GOAL #3   Title Patient will improve her R shoulder flexion to >/= 140 degrees in order to improve ability for  functional tasks.    Time 8    Period Weeks    Status On-going      PT LONG TERM GOAL #4   Title Pt will improve ER to >/= 45 degrees in order to  improve functional mobility.    Status On-going                 Plan - 02/17/20 0912    Clinical Impression Statement Patient presented in clinic with reports of shoulder stiffness upon arrival. Patient progressed through therex with more assist for flexion due to AROM limitations. Patient demonstrating less guarding with PROM with prolonged holds at end range for stretch. Firm end feels and smooth arc of motion noted during PROM. Normal vasopneumatic response noted following removal of the modality.    Examination-Activity Limitations Bathing;Carry;Dressing;Lift;Reach Overhead;Toileting    Examination-Participation Restrictions Other;Yard Work;Community Activity;Cleaning    Stability/Clinical Decision Making Stable/Uncomplicated    Rehab Potential Excellent    PT Frequency 2x / week    PT Duration 8 weeks    PT Treatment/Interventions ADLs/Self Care Home Management;Cryotherapy;Electrical Stimulation;Moist Heat;Ultrasound;Therapeutic activities;Therapeutic exercise;Neuromuscular re-education;Patient/family education;Manual techniques;Passive range of motion;Taping;Vasopneumatic Device    PT Next Visit Plan 12 weeks post op 01/29/2020; continue pulleys in sitting progress AAROM as tolerable;  PROM and AROM to tolerance Modalities As needed.    PT Home Exercise Plan pendulums, table slides    Consulted and Agree with Plan of Care Patient           Patient will benefit from skilled therapeutic intervention in order to improve the following deficits and impairments:  Pain, Postural dysfunction, Decreased strength, Decreased range of motion, Impaired UE functional use, Increased edema  Visit Diagnosis: Stiffness of right shoulder, not elsewhere classified  Acute pain of right shoulder  Muscle weakness (generalized)  Localized edema     Problem List Patient Active Problem List   Diagnosis Date Noted  . S/P arthroscopy of right shoulder for debridement of calcific tendonitis and  open rotator cuff repair  11/06/19 11/11/2019  . Nontraumatic incomplete tear of right rotator cuff   . Myalgia due to statin 12/23/2018  . Pain in joint of left shoulder 07/17/2018  . BMI 30.0-30.9,adult 07/04/2018  . Low HDL (under 40) 08/01/2017  . Hyperlipidemia with target LDL less than 100 08/18/2015  . Internal hemorrhoids 08/18/2014  . Esophageal reflux 08/18/2014    Standley Brooking, PTA 02/17/2020, 9:17 AM  [facility] [place], [place], [address] Phone: [phone]   Fax:  [phone]  Name: Laurie Kelley MRN: [id number] Date of Birth: 10-20-1970

## 2020-02-19 ENCOUNTER — Encounter: Payer: Self-pay | Admitting: Physical Therapy

## 2020-02-19 ENCOUNTER — Ambulatory Visit: Payer: Commercial Managed Care - PPO | Attending: Orthopedic Surgery | Admitting: Physical Therapy

## 2020-02-19 ENCOUNTER — Other Ambulatory Visit: Payer: Self-pay

## 2020-02-19 DIAGNOSIS — M6281 Muscle weakness (generalized): Secondary | ICD-10-CM | POA: Insufficient documentation

## 2020-02-19 DIAGNOSIS — R600 Localized edema: Secondary | ICD-10-CM | POA: Diagnosis present

## 2020-02-19 DIAGNOSIS — M25511 Pain in right shoulder: Secondary | ICD-10-CM | POA: Diagnosis present

## 2020-02-19 DIAGNOSIS — M25611 Stiffness of right shoulder, not elsewhere classified: Secondary | ICD-10-CM

## 2020-02-19 NOTE — Therapy (Signed)
[facility] [place], [place], [address] Phone: [phone]   Fax:  [phone]  Physical Therapy Treatment  Patient Details  Name: Laurie Kelley MRN: [id number] Date of Birth: September 11, 1970 Referring Provider (PT): Jorene Guest MD   Encounter Date: 02/19/2020   PT End of Session - 02/19/20 0829    Visit Number 28    Number of Visits 28    Date for PT Re-Evaluation 02/20/20    PT Start Time 0817    PT Stop Time 0910    PT Time Calculation (min) 53 min    Activity Tolerance Patient tolerated treatment well    Behavior During Therapy [facility] for tasks assessed/performed           Past Medical History:  Diagnosis Date  . Chest pain in adult 07/31/2017  . Esophageal reflux 08/18/2014  . Gallstones   . Hyperlipidemia with target LDL less than 100 08/18/2015  . Internal hemorrhoids 08/18/2014  . PONV (postoperative nausea and vomiting)     Past Surgical History:  Procedure Laterality Date  . CHOLECYSTECTOMY    . SHOULDER ARTHROSCOPY Right 11/06/2019   Procedure: ARTHROSCOPY RIGHT SHOULDER, DEBRIDEMENT X TWO STRUCTURES; OPEN ROTATOR CUFF REPAIR;  Surgeon: Carole Civil, MD;  Location: [facility];  Service: Orthopedics;  Laterality: Right;    There were no vitals filed for this visit.   Subjective Assessment - 02/19/20 0829    Subjective COVID 19 screening performed on patient upon arrival. No pain currently.    Pertinent History gall bladder removal, high cholesterol    Limitations House hold activities    Patient Stated Goals Use my arm without pain    Currently in Pain? No/denies              [facility] PT Assessment - 02/19/20 0001      Assessment   Medical Diagnosis R shoulder debridement and rotator cuff repair    Referring Provider (PT) Jorene Guest MD    Onset Date/Surgical Date 11/06/19    Hand Dominance Right    Next MD Visit 03/11/2020    Prior Therapy yes for left shoulder last year       Restrictions   Weight Bearing Restrictions No                         [facility] Adult PT Treatment/Exercise - 02/19/20 0001      Shoulder Exercises: Standing   Flexion AAROM;Right;15 reps    Extension Strengthening;Right;20 reps;Theraband    Extension Limitations [employer];Theraband    Row Limitations Green XTS    Other Standing Exercises AAROM IR, ext x15 reps    Other Standing Exercises AAROM horizontal adduction x15 reps      Shoulder Exercises: Pulleys   Flexion 5 minutes    Flexion Limitations sitting with one wheel pulley    Scaption 5 minutes    Scaption Limitations sitting with one wheel pulley      Shoulder Exercises: ROM/Strengthening   UBE (Upper Arm Bike) 90 RPM x8 mins (4' forward, 4' backward)    [place] into flexion, CW and CCW x15 each      Modalities   Modalities Vasopneumatic      Vasopneumatic   Number Minutes Vasopneumatic  10 minutes    Vasopnuematic Location  Shoulder    Vasopneumatic Pressure Low    Vasopneumatic Temperature  34/edema      Manual Therapy  Manual Therapy Passive ROM    Passive ROM PROM to R shoulder into flex, ER, IR with holds at end range                       PT Long Term Goals - 02/10/20 0919      PT LONG TERM GOAL #1   Title Patient will be independent with HEP and progression.    Time 8    Period Weeks    Status Achieved      PT LONG TERM GOAL #2   Title Patient will report ability to perform basic ADL's with pain </= 3/10 in R shoulder.    Time 8    Period Weeks    Status On-going      PT LONG TERM GOAL #3   Title Patient will improve her R shoulder flexion to >/= 140 degrees in order to improve ability for  functional tasks.    Time 8    Period Weeks    Status On-going      PT LONG TERM GOAL #4   Title Pt will improve ER to >/= 45 degrees in order to improve functional mobility.    Status On-going                 Plan - 02/19/20 0911     Clinical Impression Statement Patient presented in clinic with no current R shoulder pain. Patient progressed to more AAROM exercises in standing to progress to functional ROM. Patient limited with AAROM flexion and horizontal adduction upon observation. Moderate UT compensation noted during AAROM flexion in standing. Firm end feels and smooth arc of motion noted during PROM of R shoulder in all directions. Normal vasopnuematic response noted following removal of the modality.    Examination-Activity Limitations Bathing;Carry;Dressing;Lift;Reach Overhead;Toileting    Examination-Participation Restrictions Other;Yard Work;Community Activity;Cleaning    Stability/Clinical Decision Making Stable/Uncomplicated    Rehab Potential Excellent    PT Frequency 2x / week    PT Duration 8 weeks    PT Treatment/Interventions ADLs/Self Care Home Management;Cryotherapy;Electrical Stimulation;Moist Heat;Ultrasound;Therapeutic activities;Therapeutic exercise;Neuromuscular re-education;Patient/family education;Manual techniques;Passive range of motion;Taping;Vasopneumatic Device    PT Next Visit Plan 12 weeks post op 01/29/2020; continue pulleys in sitting progress AAROM as tolerable;  PROM and AROM to tolerance Modalities As needed.    PT Home Exercise Plan pendulums, table slides    Consulted and Agree with Plan of Care Patient           Patient will benefit from skilled therapeutic intervention in order to improve the following deficits and impairments:  Pain, Postural dysfunction, Decreased strength, Decreased range of motion, Impaired UE functional use, Increased edema  Visit Diagnosis: Stiffness of right shoulder, not elsewhere classified  Acute pain of right shoulder  Muscle weakness (generalized)  Localized edema     Problem List Patient Active Problem List   Diagnosis Date Noted  . S/P arthroscopy of right shoulder for debridement of calcific tendonitis and open rotator cuff repair  11/06/19  11/11/2019  . Nontraumatic incomplete tear of right rotator cuff   . Myalgia due to statin 12/23/2018  . Pain in joint of left shoulder 07/17/2018  . BMI 30.0-30.9,adult 07/04/2018  . Low HDL (under 40) 08/01/2017  . Hyperlipidemia with target LDL less than 100 08/18/2015  . Internal hemorrhoids 08/18/2014  . Esophageal reflux 08/18/2014    Standley Brooking, PTA 02/19/2020, 9:14 AM  [facility] [address] [place], [place], [address]  Phone: [phone]   Fax:  [phone]  Name: Laurie Kelley MRN: [id number] Date of Birth: 1971/03/24

## 2020-02-24 ENCOUNTER — Ambulatory Visit: Payer: Commercial Managed Care - PPO | Admitting: Physical Therapy

## 2020-02-24 ENCOUNTER — Encounter: Payer: Self-pay | Admitting: Physical Therapy

## 2020-02-24 ENCOUNTER — Ambulatory Visit: Payer: Commercial Managed Care - PPO | Admitting: Orthopedic Surgery

## 2020-02-24 DIAGNOSIS — M25611 Stiffness of right shoulder, not elsewhere classified: Secondary | ICD-10-CM

## 2020-02-24 DIAGNOSIS — R600 Localized edema: Secondary | ICD-10-CM

## 2020-02-24 DIAGNOSIS — M25511 Pain in right shoulder: Secondary | ICD-10-CM

## 2020-02-24 DIAGNOSIS — M6281 Muscle weakness (generalized): Secondary | ICD-10-CM

## 2020-02-24 NOTE — Therapy (Addendum)
[facility] [place], [place], [address] Phone: [phone]   Fax:  [phone]  Physical Therapy Treatment  PHYSICAL THERAPY DISCHARGE SUMMARY  Visits from Start of Care: 29  Current functional level related to goals / functional outcomes: See below   Remaining deficits: See goals   Education / Equipment: HEP Plan: Patient agrees to discharge.  Patient goals were not met. Patient is being discharged due to the patient's request.  ?????  Laurie Kelley, PT, DPT   Patient Details  Name: Laurie Kelley MRN: [id number] Date of Birth: [birth year] Referring Provider (PT): Jorene Guest MD   Encounter Date: 02/24/2020   PT End of Session - 02/24/20 1432    Visit Number 29    Number of Visits 28    Date for PT Re-Evaluation 02/20/20    PT Start Time 1430    PT Stop Time 1519    PT Time Calculation (min) 49 min    Activity Tolerance Patient tolerated treatment well    Behavior During Therapy [facility] for tasks assessed/performed           Past Medical History:  Diagnosis Date  . Chest pain in adult 07/31/2017  . Esophageal reflux 08/18/2014  . Gallstones   . Hyperlipidemia with target LDL less than 100 08/18/2015  . Internal hemorrhoids 08/18/2014  . PONV (postoperative nausea and vomiting)     Past Surgical History:  Procedure Laterality Date  . CHOLECYSTECTOMY    . SHOULDER ARTHROSCOPY Right 11/06/2019   Procedure: ARTHROSCOPY RIGHT SHOULDER, DEBRIDEMENT X TWO STRUCTURES; OPEN ROTATOR CUFF REPAIR;  Surgeon: Carole Civil, MD;  Location: [facility];  Service: Orthopedics;  Laterality: Right;    There were no vitals filed for this visit.   Subjective Assessment - 02/24/20 1432    Subjective COVID 19 screening performed on patient upon arrival. Patient reports that she needs to discharge today but wants an HEP.    Pertinent History gall bladder removal, high cholesterol    Limitations House hold activities     Patient Stated Goals Use my arm without pain    Currently in Pain? No/denies              [facility] PT Assessment - 02/24/20 0001      Assessment   Medical Diagnosis R shoulder debridement and rotator cuff repair    Referring Provider (PT) Jorene Guest MD    Onset Date/Surgical Date 11/06/19    Hand Dominance Right    Next MD Visit 03/11/2020    Prior Therapy yes for left shoulder last year      Restrictions   Weight Bearing Restrictions No      Observation/Other Assessments   Focus on Therapeutic Outcomes (FOTO)  39%, CJ      ROM / Strength   AROM / PROM / Strength AROM      AROM   Overall AROM  Deficits    AROM Assessment Site Shoulder    Right/Left Shoulder Right    Right Shoulder Flexion 130 Degrees    Right Shoulder External Rotation 38 Degrees                         [facility] Adult PT Treatment/Exercise - 02/24/20 0001      Shoulder Exercises: Sidelying   External Rotation AROM;Right;20 reps      Shoulder Exercises: Standing   External Rotation Strengthening;Right;20 reps;Theraband    Theraband Level (Shoulder External Rotation) Level 1 (  Yellow)    Internal Rotation Strengthening;Right;20 reps;Theraband    Theraband Level (Shoulder Internal Rotation) Level 1 (Yellow)    Flexion AAROM;Right;15 reps    Extension Strengthening;Right;20 reps;Theraband    Theraband Level (Shoulder Extension) Level 1 (Yellow)      Shoulder Exercises: Pulleys   Flexion 5 minutes    Flexion Limitations sitting with two wheel pulley    Scaption 5 minutes    Scaption Limitations sitting with two wheel pulley      Shoulder Exercises: ROM/Strengthening   UBE (Upper Arm Bike) 90 RPM x8 mins (4' forward, 4' backward)      Modalities   Modalities Vasopneumatic      Vasopneumatic   Number Minutes Vasopneumatic  10 minutes    Vasopnuematic Location  Shoulder    Vasopneumatic Pressure Low    Vasopneumatic Temperature  34/edema      Manual Therapy   Manual  Therapy Passive ROM    Passive ROM PROM to R shoulder into flex, ER, IR with holds at end range                  PT Education - 02/24/20 1501    Education Details HEP- resisted ER, IR, extension, SL ER, AAROMflex, wall walks, AROM flexion    Person(s) Educated Patient    Methods Explanation;Demonstration;Handout    Comprehension Verbalized understanding;Returned demonstration               PT Long Term Goals - 02/24/20 1503      PT LONG TERM GOAL #1   Title Patient will be independent with HEP and progression.    Time 8    Period Weeks    Status Achieved      PT LONG TERM GOAL #2   Title Patient will report ability to perform basic ADL's with pain </= 3/10 in R shoulder.    Time 8    Period Weeks    Status Achieved      PT LONG TERM GOAL #3   Title Patient will improve her R shoulder flexion to >/= 140 degrees in order to improve ability for  functional tasks.    Time 8    Period Weeks    Status Not Met      PT LONG TERM GOAL #4   Title Pt will improve ER to >/= 45 degrees in order to improve functional mobility.    Status Not Met                 Plan - 02/24/20 1523    Clinical Impression Statement Patient presented in clinic and needs to discharge due to insurance. Patient provided an indepth HEP with AROM, AAROM and light strengthening and provided yellow theraband. Patient guided through all HEP provided exercises and was able to ask any questions. Patient very compliant with HEP per patient report. Patient also verbally instructed in bicep curls and upper cut due to lack of strength with shoulder/elbow flexion. Patient continues to be limited with AROM ER and flexion in ROM measurements. Unable to achieve ROM goals at this time. Normal vasopnuematic response noted following removal of the modality. Patient's 29th visit FOTO score 39%, CJ status.    Examination-Activity Limitations Bathing;Carry;Dressing;Lift;Reach Overhead;Toileting     Examination-Participation Restrictions Other;Yard Work;Community Activity;Cleaning    Stability/Clinical Decision Making Stable/Uncomplicated    Rehab Potential Excellent    PT Frequency 2x / week    PT Duration 8 weeks    PT Treatment/Interventions ADLs/Self Care Home Management;Cryotherapy;Dealer  Stimulation;Moist Heat;Ultrasound;Therapeutic activities;Therapeutic exercise;Neuromuscular re-education;Patient/family education;Manual techniques;Passive range of motion;Taping;Vasopneumatic Device    PT Next Visit Plan D/C summary.    PT Home Exercise Plan pendulums, table slides    Consulted and Agree with Plan of Care Patient           Patient will benefit from skilled therapeutic intervention in order to improve the following deficits and impairments:  Pain, Postural dysfunction, Decreased strength, Decreased range of motion, Impaired UE functional use, Increased edema  Visit Diagnosis: Stiffness of right shoulder, not elsewhere classified  Acute pain of right shoulder  Muscle weakness (generalized)  Localized edema     Problem List Patient Active Problem List   Diagnosis Date Noted  . S/P arthroscopy of right shoulder for debridement of calcific tendonitis and open rotator cuff repair  11/06/19 11/11/2019  . Nontraumatic incomplete tear of right rotator cuff   . Myalgia due to statin 12/23/2018  . Pain in joint of left shoulder 07/17/2018  . BMI 30.0-30.9,adult 07/04/2018  . Low HDL (under 40) 08/01/2017  . Hyperlipidemia with target LDL less than 100 08/18/2015  . Internal hemorrhoids 08/18/2014  . Esophageal reflux 08/18/2014    Standley Brooking, PTA 02/24/2020, 3:52 PM  [facility] [place], [place], [address] Phone: [phone]   Fax:  [phone]  Name: Laurie Kelley MRN: [id number] Date of Birth: Jan 10, 1971

## 2020-02-25 ENCOUNTER — Ambulatory Visit: Payer: Commercial Managed Care - PPO | Admitting: Orthopedic Surgery

## 2020-02-26 ENCOUNTER — Encounter: Payer: Commercial Managed Care - PPO | Admitting: Physical Therapy

## 2020-02-27 ENCOUNTER — Other Ambulatory Visit: Payer: Self-pay | Admitting: Orthopedic Surgery

## 2020-02-27 DIAGNOSIS — Z9889 Other specified postprocedural states: Secondary | ICD-10-CM

## 2020-02-27 DIAGNOSIS — G8929 Other chronic pain: Secondary | ICD-10-CM

## 2020-02-27 DIAGNOSIS — M7531 Calcific tendinitis of right shoulder: Secondary | ICD-10-CM

## 2020-03-02 ENCOUNTER — Ambulatory Visit: Payer: Commercial Managed Care - PPO | Admitting: Physical Therapy

## 2020-03-04 ENCOUNTER — Encounter: Payer: Commercial Managed Care - PPO | Admitting: Physical Therapy

## 2020-03-09 ENCOUNTER — Encounter: Payer: Commercial Managed Care - PPO | Admitting: Physical Therapy

## 2020-03-11 ENCOUNTER — Encounter: Payer: Self-pay | Admitting: Orthopedic Surgery

## 2020-03-11 ENCOUNTER — Encounter: Payer: Commercial Managed Care - PPO | Admitting: Physical Therapy

## 2020-03-11 ENCOUNTER — Other Ambulatory Visit: Payer: Self-pay

## 2020-03-11 ENCOUNTER — Ambulatory Visit (INDEPENDENT_AMBULATORY_CARE_PROVIDER_SITE_OTHER): Payer: Commercial Managed Care - PPO | Admitting: Orthopedic Surgery

## 2020-03-11 DIAGNOSIS — Z9889 Other specified postprocedural states: Secondary | ICD-10-CM

## 2020-03-11 NOTE — Patient Instructions (Signed)
Ok to return to work full duty

## 2020-03-11 NOTE — Progress Notes (Signed)
Routine postop appointment  Chief Complaint  Patient presents with  . Follow-up    Recheck on right shoulder, DOS 11-06-19.    Encounter Diagnosis  Name Primary?  . S/P arthroscopy of right shoulder for debridement of calcific tendonitis and open rotator cuff repair  11/06/19 Yes    Assessment and plan  49 year old female 4 months postop calcific tendinitis debridement and rotator cuff repair has finally made good progress and will return to work tomorrow full duty will continue home exercises at home with Thera-Band's and see [place] in 4 months  Patient had to quit therapy because of the bill she worked on her own therapy and has made good progress she has active flexion of 130 degrees she still has some tightness in external rotation 30 degrees and she has a flexion contracture at the elbow that I think is coming from lateral epicondylitis     Patient presented in clinic and needs to discharge due to insurance. Patient provided an indepth HEP with AROM, AAROM and light strengthening and provided yellow theraband.  ROM / Strength    AROM / PROM / Strength AROM        AROM   Overall AROM  Deficits    AROM Assessment Site Shoulder    Right/Left Shoulder Right    Right Shoulder Flexion 130 Degrees    Right Shoulder External Rotation 38 Degrees

## 2020-03-18 ENCOUNTER — Other Ambulatory Visit: Payer: Self-pay | Admitting: Family Medicine

## 2020-04-13 ENCOUNTER — Other Ambulatory Visit: Payer: Self-pay

## 2020-04-13 ENCOUNTER — Ambulatory Visit (INDEPENDENT_AMBULATORY_CARE_PROVIDER_SITE_OTHER): Payer: Commercial Managed Care - PPO | Admitting: *Deleted

## 2020-04-13 DIAGNOSIS — Z23 Encounter for immunization: Secondary | ICD-10-CM

## 2020-04-13 DIAGNOSIS — Z3042 Encounter for surveillance of injectable contraceptive: Secondary | ICD-10-CM | POA: Diagnosis not present

## 2020-04-13 MED ORDER — MEDROXYPROGESTERONE ACETATE 150 MG/ML IM SUSP
150.0000 mg | Freq: Once | INTRAMUSCULAR | Status: AC
  Administered 2020-04-13: 150 mg via INTRAMUSCULAR

## 2020-04-25 ENCOUNTER — Other Ambulatory Visit: Payer: Self-pay | Admitting: Orthopedic Surgery

## 2020-04-29 ENCOUNTER — Other Ambulatory Visit: Payer: Self-pay | Admitting: Orthopedic Surgery

## 2020-04-29 DIAGNOSIS — Z9889 Other specified postprocedural states: Secondary | ICD-10-CM

## 2020-04-29 DIAGNOSIS — G8929 Other chronic pain: Secondary | ICD-10-CM

## 2020-04-29 DIAGNOSIS — M7531 Calcific tendinitis of right shoulder: Secondary | ICD-10-CM

## 2020-05-19 ENCOUNTER — Ambulatory Visit (INDEPENDENT_AMBULATORY_CARE_PROVIDER_SITE_OTHER): Payer: Commercial Managed Care - PPO | Admitting: Orthopedic Surgery

## 2020-05-19 ENCOUNTER — Ambulatory Visit (INDEPENDENT_AMBULATORY_CARE_PROVIDER_SITE_OTHER): Payer: Commercial Managed Care - PPO

## 2020-05-19 ENCOUNTER — Encounter: Payer: Self-pay | Admitting: Orthopedic Surgery

## 2020-05-19 ENCOUNTER — Other Ambulatory Visit: Payer: Self-pay

## 2020-05-19 VITALS — BP 132/76 | HR 79 | Ht 65.0 in | Wt 180.0 lb

## 2020-05-19 DIAGNOSIS — Z9889 Other specified postprocedural states: Secondary | ICD-10-CM | POA: Diagnosis not present

## 2020-05-19 DIAGNOSIS — Z23 Encounter for immunization: Secondary | ICD-10-CM

## 2020-05-19 NOTE — Progress Notes (Signed)
   Covid-19 Vaccination Clinic  Name:  Laurie Kelley    MRN: [id number] DOB: 13-Feb-1971  05/19/2020  Laurie Kelley was observed post Covid-19 immunization for 15 minutes without incident. She was provided with Vaccine Information Sheet and instruction to access the V-Safe system.   Laurie Kelley was instructed to call 911 with any severe reactions post vaccine: Marland Kitchen Difficulty breathing  . Swelling of face and throat  . A fast heartbeat  . A bad rash all over body  . Dizziness and weakness   Immunizations Administered    No immunizations on file.

## 2020-05-19 NOTE — Progress Notes (Signed)
Chief Complaint  Patient presents with  . Routine Post Op    Rt shoulder RCR DOS 11/06/19    Right shoulder still a little stiffer than I would like   7 months post rotator cuff repair right shoulder  Active flexion 130 degrees active abduction 90 degrees active external rotation with the arm at the side 40 degrees  Passive range of motion similar no pain is noted patient is improving just a little bit stiffer than I would like she should continue stretching and strengthening exercises she admits that she has not been diligent with that she is encouraged to do more exercises  Follow-up in 3 months

## 2020-05-19 NOTE — Patient Instructions (Signed)
Continue exercises at home

## 2020-05-22 ENCOUNTER — Other Ambulatory Visit: Payer: Self-pay | Admitting: Orthopedic Surgery

## 2020-06-10 ENCOUNTER — Other Ambulatory Visit: Payer: Self-pay | Admitting: Family Medicine

## 2020-06-16 ENCOUNTER — Telehealth: Payer: Self-pay

## 2020-06-16 ENCOUNTER — Other Ambulatory Visit: Payer: Self-pay | Admitting: Family Medicine

## 2020-06-16 MED ORDER — ATORVASTATIN CALCIUM 20 MG PO TABS: 20.0000 mg | ORAL_TABLET | Freq: Every day | ORAL | 0 refills | Status: DC

## 2020-06-16 NOTE — Telephone Encounter (Signed)
  Prescription Request  06/16/2020  What is the name of the medication or equipment? Cholesterol Rx  Have you contacted your pharmacy to request a refill? (if applicable) Yes  Which pharmacy would you like this sent to? [facility]  Pt scheduled an appt to see Dr Nadine Counts on 08/02/20 (first available appt) but needs refill sent in to last her until her appt.   Patient notified that their request is being sent to the clinical staff for review and that they should receive a response within 2 business days.

## 2020-06-16 NOTE — Telephone Encounter (Signed)
Pt informed that we need to wait to refill medication because she has not been seen in one year and has not had labs.   Pt said whomever she spoke with told her she could get a refill until the appt that is scheduled for 08/02/20. Pt was last seen by Dr. Nadine Counts on 02/12/19

## 2020-06-18 ENCOUNTER — Other Ambulatory Visit: Payer: Self-pay | Admitting: Orthopedic Surgery

## 2020-06-22 ENCOUNTER — Other Ambulatory Visit: Payer: Self-pay | Admitting: Orthopedic Surgery

## 2020-06-22 DIAGNOSIS — G8929 Other chronic pain: Secondary | ICD-10-CM

## 2020-06-22 DIAGNOSIS — M25511 Pain in right shoulder: Secondary | ICD-10-CM

## 2020-06-22 DIAGNOSIS — Z9889 Other specified postprocedural states: Secondary | ICD-10-CM

## 2020-06-22 DIAGNOSIS — M7531 Calcific tendinitis of right shoulder: Secondary | ICD-10-CM

## 2020-06-22 NOTE — Telephone Encounter (Signed)
Rx request 

## 2020-07-06 ENCOUNTER — Ambulatory Visit: Payer: Commercial Managed Care - PPO

## 2020-07-09 ENCOUNTER — Ambulatory Visit (INDEPENDENT_AMBULATORY_CARE_PROVIDER_SITE_OTHER): Payer: Commercial Managed Care - PPO

## 2020-07-09 ENCOUNTER — Other Ambulatory Visit: Payer: Self-pay

## 2020-07-09 DIAGNOSIS — Z3042 Encounter for surveillance of injectable contraceptive: Secondary | ICD-10-CM

## 2020-07-09 MED ORDER — MEDROXYPROGESTERONE ACETATE 150 MG/ML IM SUSP
150.0000 mg | INTRAMUSCULAR | Status: DC
  Administered 2020-07-09 – 2021-02-17 (×2): 150 mg via INTRAMUSCULAR

## 2020-07-09 NOTE — Progress Notes (Signed)
Medroxyprogesterone injection given to right upper outer quadrant.  Patient tolerated well. 

## 2020-08-02 ENCOUNTER — Encounter: Payer: Self-pay | Admitting: Family Medicine

## 2020-08-02 ENCOUNTER — Other Ambulatory Visit: Payer: Self-pay

## 2020-08-02 ENCOUNTER — Ambulatory Visit (INDEPENDENT_AMBULATORY_CARE_PROVIDER_SITE_OTHER): Payer: Commercial Managed Care - PPO | Admitting: Family Medicine

## 2020-08-02 VITALS — BP 139/78 | HR 78 | Temp 98.1°F | Ht 65.0 in | Wt 178.0 lb

## 2020-08-02 DIAGNOSIS — R5383 Other fatigue: Secondary | ICD-10-CM | POA: Diagnosis not present

## 2020-08-02 DIAGNOSIS — Z9889 Other specified postprocedural states: Secondary | ICD-10-CM

## 2020-08-02 DIAGNOSIS — R413 Other amnesia: Secondary | ICD-10-CM

## 2020-08-02 DIAGNOSIS — Z0000 Encounter for general adult medical examination without abnormal findings: Secondary | ICD-10-CM

## 2020-08-02 DIAGNOSIS — G8929 Other chronic pain: Secondary | ICD-10-CM

## 2020-08-02 DIAGNOSIS — E785 Hyperlipidemia, unspecified: Secondary | ICD-10-CM

## 2020-08-02 DIAGNOSIS — Z0001 Encounter for general adult medical examination with abnormal findings: Secondary | ICD-10-CM | POA: Diagnosis not present

## 2020-08-02 DIAGNOSIS — M25512 Pain in left shoulder: Secondary | ICD-10-CM

## 2020-08-02 DIAGNOSIS — Z820 Family history of epilepsy and other diseases of the nervous system: Secondary | ICD-10-CM

## 2020-08-02 MED ORDER — MEDROXYPROGESTERONE ACETATE 150 MG/ML IM SUSP: 150.0000 mg | INTRAMUSCULAR | 4 refills | Status: DC

## 2020-08-02 NOTE — Patient Instructions (Signed)
You had labs performed today.  You will be contacted with the results of the labs once they are available, usually in the next 3 business days for routine lab work.  If you have an active my chart account, they will be released to your MyChart.  If you prefer to have these labs released to you via telephone, please let us know.  If you had a pap smear or biopsy performed, expect to be contacted in about 7-10 days.  Preventive Care 40-48 Years Old, Female Preventive care refers to lifestyle choices and visits with your health care provider that can promote health and wellness. This includes:  A yearly physical exam. This is also called an annual wellness visit.  Regular dental and eye exams.  Immunizations.  Screening for certain conditions.  Healthy lifestyle choices, such as: ? Eating a healthy diet. ? Getting regular exercise. ? Not using drugs or products that contain nicotine and tobacco. ? Limiting alcohol use. What can I expect for my preventive care visit? Physical exam Your health care provider will check your:  Height and weight. These may be used to calculate your BMI (body mass index). BMI is a measurement that tells if you are at a healthy weight.  Heart rate and blood pressure.  Body temperature.  Skin for abnormal spots. Counseling Your health care provider may ask you questions about your:  Past medical problems.  Family's medical history.  Alcohol, tobacco, and drug use.  Emotional well-being.  Home life and relationship well-being.  Sexual activity.  Diet, exercise, and sleep habits.  Work and work environment.  Access to firearms.  Method of birth control.  Menstrual cycle.  Pregnancy history. What immunizations do I need? Vaccines are usually given at various ages, according to a schedule. Your health care provider will recommend vaccines for you based on your age, medical history, and lifestyle or other factors, such as travel or where you  work.   What tests do I need? Blood tests  Lipid and cholesterol levels. These may be checked every 5 years, or more often if you are over 50 years old.  Hepatitis C test.  Hepatitis B test. Screening  Lung cancer screening. You may have this screening every year starting at age 55 if you have a 30-pack-year history of smoking and currently smoke or have quit within the past 15 years.  Colorectal cancer screening. ? All adults should have this screening starting at age 50 and continuing until age 75. ? Your health care provider may recommend screening at age 45 if you are at increased risk. ? You will have tests every 1-10 years, depending on your results and the type of screening test.  Diabetes screening. ? This is done by checking your blood sugar (glucose) after you have not eaten for a while (fasting). ? You may have this done every 1-3 years.  Mammogram. ? This may be done every 1-2 years. ? Talk with your health care provider about when you should start having regular mammograms. This may depend on whether you have a family history of breast cancer.  BRCA-related cancer screening. This may be done if you have a family history of breast, ovarian, tubal, or peritoneal cancers.  Pelvic exam and Pap test. ? This may be done every 3 years starting at age 21. ? Starting at age 30, this may be done every 5 years if you have a Pap test in combination with an HPV test. Other tests  STD (sexually transmitted   disease) testing, if you are at risk.  Bone density scan. This is done to screen for osteoporosis. You may have this scan if you are at high risk for osteoporosis. Talk with your health care provider about your test results, treatment options, and if necessary, the need for more tests. Follow these instructions at home: Eating and drinking  Eat a diet that includes fresh fruits and vegetables, whole grains, lean protein, and low-fat dairy products.  Take vitamin and mineral  supplements as recommended by your health care provider.  Do not drink alcohol if: ? Your health care provider tells you not to drink. ? You are pregnant, may be pregnant, or are planning to become pregnant.  If you drink alcohol: ? Limit how much you have to 0-1 drink a day. ? Be aware of how much alcohol is in your drink. In the [place] one drink equals one 12 oz bottle of beer (355 mL), one 5 oz glass of wine (148 mL), or one 1 oz glass of hard liquor (44 mL).   Lifestyle  Take daily care of your teeth and gums. Brush your teeth every morning and night with fluoride toothpaste. Floss one time each day.  Stay active. Exercise for at least 30 minutes 5 or more days each week.  Do not use any products that contain nicotine or tobacco, such as cigarettes, e-cigarettes, and chewing tobacco. If you need help quitting, ask your health care provider.  Do not use drugs.  If you are sexually active, practice safe sex. Use a condom or other form of protection to prevent STIs (sexually transmitted infections).  If you do not wish to become pregnant, use a form of birth control. If you plan to become pregnant, see your health care provider for a prepregnancy visit.  If told by your health care provider, take low-dose aspirin daily starting at age 50.  Find healthy ways to cope with stress, such as: ? Meditation, yoga, or listening to music. ? Journaling. ? Talking to a trusted person. ? Spending time with friends and family. Safety  Always wear your seat belt while driving or riding in a vehicle.  Do not drive: ? If you have been drinking alcohol. Do not ride with someone who has been drinking. ? When you are tired or distracted. ? While texting.  Wear a helmet and other protective equipment during sports activities.  If you have firearms in your house, make sure you follow all gun safety procedures. What's next?  Visit your health care provider once a year for an annual wellness  visit.  Ask your health care provider how often you should have your eyes and teeth checked.  Stay up to date on all vaccines. This information is not intended to replace advice given to you by your health care provider. Make sure you discuss any questions you have with your health care provider. Document Revised: 03/09/2020 Document Reviewed: 02/14/2018 Elsevier Patient Education  2021 Elsevier Inc.  

## 2020-08-02 NOTE — Progress Notes (Signed)
Laurie Kelley is a 50 y.o. female presents to office today for annual physical exam examination.    Concerns today include: 1.  Fatigue Patient reports quite a bit of fatigue over the last year or so.  She wonders if it could be medication induced.  She takes a tizanidine every night at bedtime along with meloxicam since she had a right shoulder repair in May of last year.  She was advised to continue this regimen for up to a year by her orthopedist because that is about how long it will take for her to fully repair.  She has an appointment with him next month and plans on discussing ongoing need for that medicine.  She does snore.  To her knowledge she has never paused breathing.  No reports of bleeding, depression, increased stressors.  No change in bowel habits  2.  Memory loss Patient for about the last year or so has also had some short-term memory loss.  She states that when she watches the Price is right she cannot recall the item that is up for a bit.  She often will also forget whether or not she completed a task.  She denies getting lost or having attention deficit issues.  She denies forgetting her family's name.  No balance or sensory changes.  No changes in medication except for as above.  No recent traumatic events or change in lifestyle.  Family history is significant for Alzheimer's dementia and her maternal grandmother, who developed dementia in her 82s.  3.  Contraception Patient continues on Depo-Provera every 3 months.  She has no menstrual cycles on the Depo-Provera.  Denies any intolerance.  No breast concerns.  No abnormal vaginal bleeding, vaginal discharge or pelvic pain  Occupation: works in Industrial/product designer,  Substance use: none Last eye exam: UTD Last dental exam: UTD Last colonoscopy: not yet Last mammogram: needs Last pap smear: UTD Immunizations needed:  Immunization History  Administered Date(s) Administered  . Hepatitis B 05/11/2004, 06/09/2004  .  Influenza,inj,Quad PF,6+ Mos 08/17/2016, 03/28/2018, 02/21/2019, 04/13/2020  . Moderna SARS-COV2 Booster Vaccination 05/19/2020  . Moderna Sars-Covid-2 Vaccination 07/04/2019, 08/06/2019  . Tdap 08/11/2014    Past Medical History:  Diagnosis Date  . Chest pain in adult 07/31/2017  . Esophageal reflux 08/18/2014  . Gallstones   . Hyperlipidemia with target LDL less than 100 08/18/2015  . Internal hemorrhoids 08/18/2014  . PONV (postoperative nausea and vomiting)    Social History   Socioeconomic History  . Marital status: Divorced    Spouse name: Not on file  . Number of children: Not on file  . Years of education: Not on file  . Highest education level: Not on file  Occupational History  . Not on file  Tobacco Use  . Smoking status: Never Smoker  . Smokeless tobacco: Never Used  Vaping Use  . Vaping Use: Never used  Substance and Sexual Activity  . Alcohol use: No  . Drug use: No  . Sexual activity: Not Currently  Other Topics Concern  . Not on file  Social History Narrative  . Not on file   Social Determinants of Health   Financial Resource Strain: Not on file  Food Insecurity: Not on file  Transportation Needs: Not on file  Physical Activity: Not on file  Stress: Not on file  Social Connections: Not on file  Intimate Partner Violence: Not on file   Past Surgical History:  Procedure Laterality Date  . CHOLECYSTECTOMY    .  SHOULDER ARTHROSCOPY Right 11/06/2019   Procedure: ARTHROSCOPY RIGHT SHOULDER, DEBRIDEMENT X TWO STRUCTURES; OPEN ROTATOR CUFF REPAIR;  Surgeon: Carole Civil, MD;  Location: [facility];  Service: Orthopedics;  Laterality: Right;   Family History  Problem Relation Age of Onset  . Hyperlipidemia Mother   . Heart disease Father   . Lung cancer Maternal Aunt   . Brain cancer Maternal Aunt   . Bone cancer Maternal Aunt     Current Outpatient Medications:  .  tiZANidine (ZANAFLEX) 4 MG tablet, TAKE 1 TABLET (4 MG TOTAL) BY MOUTH DAILY., Disp:  30 tablet, Rfl: 5 .  atorvastatin (LIPITOR) 20 MG tablet, Take 1 tablet (20 mg total) by mouth daily. Needs OV, Disp: 90 tablet, Rfl: 0 .  cholecalciferol (VITAMIN D3) 25 MCG (1000 UNIT) tablet, Take 1,000 Units by mouth daily., Disp: , Rfl:  .  ibuprofen (ADVIL) 800 MG tablet, TAKE 1 TABLET BY MOUTH EVERY 8 HOURS AS NEEDED, Disp: 90 tablet, Rfl: 1 .  magnesium oxide (MAG-OX) 400 MG tablet, Take 400 mg by mouth daily., Disp: , Rfl:  .  meloxicam (MOBIC) 7.5 MG tablet, TAKE 1 TABLET BY MOUTH EVERY DAY, Disp: 30 tablet, Rfl: 5 .  Pumpkin Seed-Soy Germ (AZO BLADDER CONTROL/GO-LESS) CAPS, Take 1 capsule by mouth at bedtime. , Disp: , Rfl:   Current Facility-Administered Medications:  .  medroxyPROGESTERone (DEPO-PROVERA) injection 150 mg, 150 mg, Intramuscular, Q90 days, Bow Buntyn M, DO, 150 mg at 07/09/20 1139  No Known Allergies   ROS: Review of Systems Pertinent items noted in HPI and remainder of comprehensive ROS otherwise negative.    Physical exam BP 139/78   Pulse 78   Temp 98.1 F (36.7 C) (Temporal)   Ht 5' 5"  (1.651 m)   Wt 178 lb (80.7 kg)   SpO2 98%   BMI 29.62 kg/m  General appearance: alert, cooperative, appears stated age and no distress Head: Normocephalic, without obvious abnormality, atraumatic Eyes: negative findings: lids and lashes normal, conjunctivae and sclerae normal, corneas clear and pupils equal, round, reactive to light and accomodation Ears: normal TM's and external ear canals both ears Nose: Nares normal. Septum midline. Mucosa normal. No drainage or sinus tenderness. Throat: lips, mucosa, and tongue normal; teeth and gums normal Neck: no adenopathy, no carotid bruit, supple, symmetrical, trachea midline and thyroid not enlarged, symmetric, no tenderness/mass/nodules Back: symmetric, no curvature. ROM normal. No CVA tenderness. Lungs: clear to auscultation bilaterally Heart: regular rate and rhythm, S1, S2 normal, no murmur, click, rub or  gallop Abdomen: soft, non-tender; bowel sounds normal; no masses,  no organomegaly Extremities: extremities normal, atraumatic, no cyanosis or edema Pulses: 2+ and symmetric Skin: Skin color, texture, turgor normal. No rashes or lesions Lymph nodes: Cervical, supraclavicular, and axillary nodes normal. Neurologic: Alert and oriented X 3, normal strength and tone. Normal symmetric reflexes. Normal coordination and gait Psych: mood stable, speech normal. Affect somewhat flat  Depression screen [facility] 2/9 08/02/2020 02/12/2019 12/23/2018  Decreased Interest 0 0 0  Down, Depressed, Hopeless 0 0 0  PHQ - 2 Score 0 0 0  Altered sleeping 0 0 0  Tired, decreased energy 0 0 0  Change in appetite 0 0 0  Feeling bad or failure about yourself  0 0 0  Trouble concentrating 0 0 0  Moving slowly or fidgety/restless 0 0 0  Suicidal thoughts 0 0 0  PHQ-9 Score 0 0 0   MMSE - Mini Mental State Exam 08/02/2020  Orientation to time 5  Orientation to Place 5  Registration 3  Attention/ Calculation 5  Recall 3  Language- name 2 objects 2  Language- repeat 1  Language- follow 3 step command 3  Language- read & follow direction 1  Write a sentence 1  Copy design 1  Total score 30     Assessment/ Plan: [place] here for annual physical exam.   Annual physical exam  Hyperlipidemia with target LDL less than 100 - Plan: CMP14+EGFR, Lipid Panel, TSH, TSH, Lipid Panel, CMP14+EGFR  S/P arthroscopy of right shoulder for debridement of calcific tendonitis and open rotator cuff repair  11/06/19 - Plan: CBC, CBC  Chronic left shoulder pain  Poor short term memory - Plan: Vitamin B12  Family history of Alzheimer's disease  Fatigue, unspecified type - Plan: Vitamin B12  Check fasting labs.   Continues to have issues with shoulder pain but this time left-sided.  She does not wish to undergo any more surgery.  May need to consider corticosteroid injection.  She will discuss this further with her  orthopedist next month  Uncertain as to what her short-term memory loss is coming from.  Check vitamin B12.  Her MMSE score was totally normal.  She does not have a family history of early dementia.  She denied having any depressive symptoms or symptoms of ADHD.  May consider referral to neurology simply for sleep testing to make sure there is not an undiagnosed sleep apnea potentially impacting her daytime energy and concentration  With regards to her fatigue again, may be related to tizanidine versus undiagnosed sleep apnea.  We will look for metabolic etiology today as well.  Handout on healthy lifestyle choices, including diet (rich in fruits, vegetables and lean meats and low in salt and simple carbohydrates) and exercise (at least 30 minutes of moderate physical activity daily).  Patient to follow up in 1 year for annual exam or sooner if needed.  Christain Mcraney M. Lajuana Ripple, DO

## 2020-08-03 LAB — CBC
Hematocrit: 42.1 % (ref 34.0–46.6)
Hemoglobin: 14 g/dL (ref 11.1–15.9)
MCH: 29.7 pg (ref 26.6–33.0)
MCHC: 33.3 g/dL (ref 31.5–35.7)
MCV: 89 fL (ref 79–97)
Platelets: 289 10*3/uL (ref 150–450)
RBC: 4.72 x10E6/uL (ref 3.77–5.28)
RDW: 13.2 % (ref 11.7–15.4)
WBC: 5.9 10*3/uL (ref 3.4–10.8)

## 2020-08-03 LAB — CMP14+EGFR
ALT: 16 IU/L (ref 0–32)
AST: 15 IU/L (ref 0–40)
Albumin/Globulin Ratio: 2.5 — ABNORMAL HIGH (ref 1.2–2.2)
Albumin: 4.5 g/dL (ref 3.8–4.8)
Alkaline Phosphatase: 216 IU/L — ABNORMAL HIGH (ref 44–121)
BUN/Creatinine Ratio: 15 (ref 9–23)
BUN: 12 mg/dL (ref 6–24)
Bilirubin Total: 0.7 mg/dL (ref 0.0–1.2)
CO2: 19 mmol/L — ABNORMAL LOW (ref 20–29)
Calcium: 11.2 mg/dL — ABNORMAL HIGH (ref 8.7–10.2)
Chloride: 108 mmol/L — ABNORMAL HIGH (ref 96–106)
Creatinine, Ser: 0.8 mg/dL (ref 0.57–1.00)
GFR calc Af Amer: 102 mL/min/{1.73_m2} (ref >59)
GFR calc non Af Amer: 88 mL/min/{1.73_m2} (ref >59)
Globulin, Total: 1.8 g/dL (ref 1.5–4.5)
Glucose: 97 mg/dL (ref 65–99)
Potassium: 4.6 mmol/L (ref 3.5–5.2)
Sodium: 142 mmol/L (ref 134–144)
Total Protein: 6.3 g/dL (ref 6.0–8.5)

## 2020-08-03 LAB — LIPID PANEL
Chol/HDL Ratio: 3.2 ratio (ref 0.0–4.4)
Cholesterol, Total: 103 mg/dL (ref 100–199)
HDL: 32 mg/dL — ABNORMAL LOW (ref >39)
LDL Chol Calc (NIH): 57 mg/dL (ref 0–99)
Triglycerides: 65 mg/dL (ref 0–149)
VLDL Cholesterol Cal: 14 mg/dL (ref 5–40)

## 2020-08-03 LAB — VITAMIN B12: Vitamin B-12: 441 pg/mL (ref 232–1245)

## 2020-08-03 LAB — TSH: TSH: 0.911 u[IU]/mL (ref 0.450–4.500)

## 2020-08-18 ENCOUNTER — Ambulatory Visit: Payer: Commercial Managed Care - PPO | Admitting: Orthopedic Surgery

## 2020-08-26 ENCOUNTER — Other Ambulatory Visit: Payer: Self-pay

## 2020-08-26 ENCOUNTER — Encounter: Payer: Self-pay | Admitting: Orthopedic Surgery

## 2020-08-26 ENCOUNTER — Other Ambulatory Visit: Payer: Self-pay | Admitting: Orthopedic Surgery

## 2020-08-26 ENCOUNTER — Ambulatory Visit (INDEPENDENT_AMBULATORY_CARE_PROVIDER_SITE_OTHER): Payer: Commercial Managed Care - PPO | Admitting: Orthopedic Surgery

## 2020-08-26 VITALS — BP 151/79 | HR 82 | Ht 65.0 in | Wt 180.0 lb

## 2020-08-26 DIAGNOSIS — G8929 Other chronic pain: Secondary | ICD-10-CM | POA: Diagnosis not present

## 2020-08-26 DIAGNOSIS — Z9889 Other specified postprocedural states: Secondary | ICD-10-CM | POA: Diagnosis not present

## 2020-08-26 DIAGNOSIS — M7531 Calcific tendinitis of right shoulder: Secondary | ICD-10-CM

## 2020-08-26 DIAGNOSIS — M25511 Pain in right shoulder: Secondary | ICD-10-CM

## 2020-08-26 NOTE — Progress Notes (Signed)
F/u  Encounter Diagnoses  Name Primary?  . S/P arthroscopy of right shoulder for debridement of calcific tendonitis and open rotator cuff repair  11/06/19 Yes  . Calcific tendonitis of right shoulder   . Chronic right shoulder pain     Chief Complaint  Patient presents with  . Shoulder Pain    Right. S/p 11/06/19.     50 year old female had arthroscopic surgery right shoulder with debridement for calcific tendinitis and then repair of the defect of the rotator cuff right shoulder on May 20 the 2021.  She is doing well she says she has turned the corner and is happy with the shoulder  She exhibits full equal range of motion and normal strength in the right shoulder  I am releasing her today she can stop tizanidine ibuprofen and meloxicam

## 2020-08-28 ENCOUNTER — Other Ambulatory Visit: Payer: Self-pay | Admitting: Orthopedic Surgery

## 2020-09-22 ENCOUNTER — Other Ambulatory Visit: Payer: Self-pay | Admitting: Family Medicine

## 2020-09-22 ENCOUNTER — Telehealth: Payer: Self-pay

## 2020-09-22 MED ORDER — ATORVASTATIN CALCIUM 20 MG PO TABS: 20.0000 mg | ORAL_TABLET | Freq: Every day | ORAL | 0 refills | Status: DC

## 2020-09-22 NOTE — Telephone Encounter (Signed)
Sent to pharm ....

## 2020-09-22 NOTE — Telephone Encounter (Signed)
  Prescription Request  09/22/2020  What is the name of the medication or equipment? atorvastatin (LIPITOR) 20 MG tablet  Have you contacted your pharmacy to request a refill? (if applicable) yes. Pt says that she should have refills on file. She had a ov 08/02/2020 with [facility]. She will be out today. I explained that a 90 day supply with no refills was sent in Dec. Please call pt back to clarify  Which pharmacy would you like this sent to? [facility]   Patient notified that their request is being sent to the clinical staff for review and that they should receive a response within 2 business days.

## 2020-09-27 ENCOUNTER — Other Ambulatory Visit: Payer: Self-pay

## 2020-09-27 ENCOUNTER — Ambulatory Visit: Payer: Commercial Managed Care - PPO | Admitting: *Deleted

## 2020-09-27 DIAGNOSIS — Z3042 Encounter for surveillance of injectable contraceptive: Secondary | ICD-10-CM

## 2020-09-27 NOTE — Progress Notes (Signed)
Pt tolerated Depo shot well

## 2020-12-10 ENCOUNTER — Other Ambulatory Visit: Payer: Self-pay | Admitting: Family Medicine

## 2020-12-10 NOTE — Telephone Encounter (Signed)
OV 08/02/20 rtc 1 yr

## 2020-12-16 ENCOUNTER — Ambulatory Visit (INDEPENDENT_AMBULATORY_CARE_PROVIDER_SITE_OTHER): Payer: Commercial Managed Care - PPO | Admitting: *Deleted

## 2020-12-16 ENCOUNTER — Other Ambulatory Visit: Payer: Self-pay

## 2020-12-16 DIAGNOSIS — Z3042 Encounter for surveillance of injectable contraceptive: Secondary | ICD-10-CM | POA: Diagnosis not present

## 2020-12-16 MED ORDER — MEDROXYPROGESTERONE ACETATE 150 MG/ML IM SUSP
150.0000 mg | Freq: Once | INTRAMUSCULAR | Status: AC
Start: 2020-12-16 — End: 2020-12-16
  Administered 2020-12-16: 150 mg via INTRAMUSCULAR

## 2021-01-21 ENCOUNTER — Encounter (HOSPITAL_COMMUNITY): Payer: Self-pay | Admitting: Pharmacy Technician

## 2021-01-21 ENCOUNTER — Emergency Department (HOSPITAL_COMMUNITY): Payer: Commercial Managed Care - PPO

## 2021-01-21 ENCOUNTER — Other Ambulatory Visit: Payer: Self-pay

## 2021-01-21 ENCOUNTER — Emergency Department (HOSPITAL_COMMUNITY)
Admission: EM | Admit: 2021-01-21 | Discharge: 2021-01-21 | DRG: 055 | Disposition: A | Discharge status: Home / Self Care | Payer: Commercial Managed Care - PPO | Attending: Internal Medicine | Admitting: Internal Medicine

## 2021-01-21 ENCOUNTER — Inpatient Hospital Stay (HOSPITAL_COMMUNITY): Payer: Commercial Managed Care - PPO

## 2021-01-21 DIAGNOSIS — E785 Hyperlipidemia, unspecified: Secondary | ICD-10-CM | POA: Diagnosis present

## 2021-01-21 DIAGNOSIS — H5462 Unqualified visual loss, left eye, normal vision right eye: Secondary | ICD-10-CM | POA: Diagnosis not present

## 2021-01-21 DIAGNOSIS — M8580 Other specified disorders of bone density and structure, unspecified site: Secondary | ICD-10-CM | POA: Diagnosis not present

## 2021-01-21 DIAGNOSIS — Z808 Family history of malignant neoplasm of other organs or systems: Secondary | ICD-10-CM

## 2021-01-21 DIAGNOSIS — Z8249 Family history of ischemic heart disease and other diseases of the circulatory system: Secondary | ICD-10-CM | POA: Diagnosis not present

## 2021-01-21 DIAGNOSIS — Z801 Family history of malignant neoplasm of trachea, bronchus and lung: Secondary | ICD-10-CM | POA: Diagnosis not present

## 2021-01-21 DIAGNOSIS — R519 Headache, unspecified: Secondary | ICD-10-CM | POA: Diagnosis present

## 2021-01-21 DIAGNOSIS — D329 Benign neoplasm of meninges, unspecified: Secondary | ICD-10-CM | POA: Diagnosis not present

## 2021-01-21 DIAGNOSIS — D496 Neoplasm of unspecified behavior of brain: Secondary | ICD-10-CM

## 2021-01-21 DIAGNOSIS — M898X8 Other specified disorders of bone, other site: Secondary | ICD-10-CM | POA: Diagnosis present

## 2021-01-21 DIAGNOSIS — Z83438 Family history of other disorder of lipoprotein metabolism and other lipidemia: Secondary | ICD-10-CM

## 2021-01-21 DIAGNOSIS — Z20822 Contact with and (suspected) exposure to covid-19: Secondary | ICD-10-CM | POA: Diagnosis not present

## 2021-01-21 DIAGNOSIS — Z791 Long term (current) use of non-steroidal anti-inflammatories (NSAID): Secondary | ICD-10-CM

## 2021-01-21 DIAGNOSIS — Z79899 Other long term (current) drug therapy: Secondary | ICD-10-CM | POA: Diagnosis not present

## 2021-01-21 LAB — CBC WITH DIFFERENTIAL/PLATELET
Abs Immature Granulocytes: 0.01 10*3/uL (ref 0.00–0.07)
Basophils Absolute: 0 10*3/uL (ref 0.0–0.1)
Basophils Relative: 1 %
Eosinophils Absolute: 0 10*3/uL (ref 0.0–0.5)
Eosinophils Relative: 1 %
HCT: 43.6 % (ref 36.0–46.0)
Hemoglobin: 14.3 g/dL (ref 12.0–15.0)
Immature Granulocytes: 0 %
Lymphocytes Relative: 35 %
Lymphs Abs: 2.3 10*3/uL (ref 0.7–4.0)
MCH: 29.4 pg (ref 26.0–34.0)
MCHC: 32.8 g/dL (ref 30.0–36.0)
MCV: 89.7 fL (ref 80.0–100.0)
Monocytes Absolute: 0.3 10*3/uL (ref 0.1–1.0)
Monocytes Relative: 5 %
Neutro Abs: 3.8 10*3/uL (ref 1.7–7.7)
Neutrophils Relative %: 58 %
Platelets: 319 10*3/uL (ref 150–400)
RBC: 4.86 MIL/uL (ref 3.87–5.11)
RDW: 13 % (ref 11.5–15.5)
WBC: 6.5 10*3/uL (ref 4.0–10.5)
nRBC: 0 % (ref 0.0–0.2)

## 2021-01-21 LAB — BASIC METABOLIC PANEL
Anion gap: 5 (ref 5–15)
BUN: 8 mg/dL (ref 6–20)
CO2: 25 mmol/L (ref 22–32)
Calcium: 11.3 mg/dL — ABNORMAL HIGH (ref 8.9–10.3)
Chloride: 109 mmol/L (ref 98–111)
Creatinine, Ser: 0.74 mg/dL (ref 0.44–1.00)
GFR, Estimated: >60 mL/min (ref >60)
Glucose, Bld: 104 mg/dL — ABNORMAL HIGH (ref 70–99)
Potassium: 4.1 mmol/L (ref 3.5–5.1)
Sodium: 139 mmol/L (ref 135–145)

## 2021-01-21 LAB — RESP PANEL BY RT-PCR (FLU A&B, COVID) ARPGX2
Influenza A by PCR: NEGATIVE
Influenza B by PCR: NEGATIVE
SARS Coronavirus 2 by RT PCR: NEGATIVE

## 2021-01-21 MED ORDER — GADOBUTROL 1 MMOL/ML IV SOLN
7.5000 mL | Freq: Once | INTRAVENOUS | Status: AC | PRN
  Administered 2021-01-21: 7.5 mL via INTRAVENOUS

## 2021-01-21 MED ORDER — DEXAMETHASONE 4 MG PO TABS: 4.0000 mg | ORAL_TABLET | Freq: Every day | ORAL | 2 refills | Status: DC

## 2021-01-21 NOTE — ED Notes (Signed)
Pt given sandwich bag and sprite

## 2021-01-21 NOTE — ED Triage Notes (Signed)
Pt sent here from eye dr with reports of R eye "bulging" for the last 6 months. Pt with blurry vision for unknown amount of time to the L eye. Pt with headache intermittently for the last month.

## 2021-01-21 NOTE — ED Notes (Signed)
Patient transported to MRI 

## 2021-01-21 NOTE — ED Notes (Signed)
Paged neuro a second time

## 2021-01-21 NOTE — ED Provider Notes (Signed)
Emergency Medicine Provider Triage Evaluation Note  Laurie Kelley , a 50 y.o. female  was evaluated in triage.  Pt complains of an ocular left vision loss.  States that it is somewhat blurry, but occasionally completely dark.  She also has bulging to the right eye that has been going on for 6 months.  Was seen earlier today by ophthalmology and sent to the ER for MRI orbits.  She also has a headache, but has a known history of headaches..  Review of Systems  Positive: Vision loss, headache Negative: Head trauma  Physical Exam  There were no vitals taken for this visit. Gen:   Awake, no distress   Resp:  Normal effort  MSK:   Moves extremities without difficulty  Other:  Pupils are reactive to light  Medical Decision Making  Medically screening exam initiated at 10:38 AM.  Appropriate orders placed.  Laurie Kelley was informed that the remainder of the evaluation will be completed by another provider, this initial triage assessment does not replace that evaluation, and the importance of remaining in the ED until their evaluation is complete.     Sherrill Raring, PA-C 01/21/21 1040    Malvin Johns, MD 01/21/21 [phone]

## 2021-01-21 NOTE — Discharge Instructions (Addendum)
Please call to schedule follow-up with Dr. Venetia Constable with neurosurgery for further evaluation and management of the meningioma.

## 2021-01-21 NOTE — Consult Note (Signed)
Neurosurgery Consultation  Reason for Consult: Brain tumor Referring Physician: [place]  CC: Visual loss  HPI: This is a 50 y.o. woman that presents with acute on chronic visual loss. She has noticed blurry vision slowly progressive for months to years, has had multiple prescriptions that did not help her vision, then a few days ago it got worse, went to ophtho who did visual fields / OCT that showed some left temporal ON thinning. She also endorses headaches, has chronic headaches but over the past year or two she has a new semiology of sharper headaches on the right side - not into the face in a lancinating / TGN fashion but still different than her chronic headaches. She also endorses some occasional paresthesias of the right face but rarely.   ROS: A 14 point ROS was performed and is negative except as noted in the HPI.   PMHx:  Past Medical History:  Diagnosis Date   Chest pain in adult 07/31/2017   Esophageal reflux 08/18/2014   Gallstones    Hyperlipidemia with target LDL less than 100 08/18/2015   Internal hemorrhoids 08/18/2014   PONV (postoperative nausea and vomiting)    FamHx:  Family History  Problem Relation Age of Onset   Hyperlipidemia Mother    Heart disease Father    Lung cancer Maternal Aunt    Brain cancer Maternal Aunt    Bone cancer Maternal Aunt    SocHx:  reports that she has never smoked. She has never used smokeless tobacco. She reports that she does not drink alcohol and does not use drugs.  Exam: Vital signs in last 24 hours: Temp:  [98.6 F (37 C)] 98.6 F (37 C) (08/05 1040) Pulse Rate:  [71-78] 78 (08/05 1430) Resp:  [15-16] 15 (08/05 1430) BP: (131-134)/(83-86) 131/83 (08/05 1430) SpO2:  [100 %] 100 % (08/05 1430) General: Awake, alert, cooperative, lying in bed in NAD Head: Normocephalic and atruamatic HEENT: Neck supple, +proptosis OD Pulmonary: breathing room air comfortably, no evidence of increased work of breathing Cardiac: RRR Abdomen: S  NT ND Extremities: Warm and well perfused x4 Neuro: AOx3, PERRL, EOMI, FS & SS Strength 5/5 x4, SILTx4   Assessment and Plan: 50 y.o. woman w/ acute on chronic visual loss. MRI brain personally reviewed, which shows diffuse enhancing masses along the skull base from the left paraclinoid region with the largest mass on the left and entering the left optic canal, it extends contralaterally into the right infratemporal fossa. 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 Given the appearance, especially the hyperostosis, c/w diffuse meningioma. Of note, pt has a sister with 10+ intracranial meningiomas, no family h/o NF2 or other pathology associated with NF2.  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

## 2021-01-21 NOTE — ED Provider Notes (Addendum)
[facility]ERGENC[facility] DEPARTMENT Provider Note   CSN: [id number] Arrival date & time: 01/21/21  1019     History Chief Complaint  Patient presents with   Eye Problem    Laurie Kelley is a 50 y.o. female.   Eye Problem Associated symptoms: headaches   Associated symptoms: no itching, no nausea, no photophobia, no redness and no vomiting    Patient presents with monocular vision loss to the left. She is unsure when this started, but noticed it two days ago. Describes as blurry vision, occasionally completely black  She has also been having some proptosis of her right eye for 2-6 months. She was sent to ED from the ophthalmologist [facility] office for MRI orbits.  She wears glasses, no contact use.  She has not noticed any discharge, erythema, pruritus in the either of her eyes.  She has also been having right-sided headaches that can last all day, she states she has a history of headaches but these do feel somewhat different.  They have been ongoing for the last month or 2.  She has tried Tylenol with minimal relief.  No history of myasthenia gravis or thyroid disease.  She is not having any dysphagia, but notes that she was recently told she has some fluid behind her ears and was encouraged to take Claritin.   Past Medical History:  Diagnosis Date   Chest pain in adult 07/31/2017   Esophageal reflux 08/18/2014   Gallstones    Hyperlipidemia with target LDL less than 100 08/18/2015   Internal hemorrhoids 08/18/2014   PONV (postoperative nausea and vomiting)     Patient Active Problem List   Diagnosis Date Noted   S/P arthroscopy of right shoulder for debridement of calcific tendonitis and open rotator cuff repair  11/06/19 11/11/2019   Nontraumatic incomplete tear of right rotator cuff    Myalgia due to statin 12/23/2018   Pain in joint of left shoulder 07/17/2018   BMI 30.0-30.9,adult 07/04/2018   Low HDL (under 40) 08/01/2017   Hyperlipidemia with target  LDL less than 100 08/18/2015   Internal hemorrhoids 08/18/2014   Esophageal reflux 08/18/2014    Past Surgical History:  Procedure Laterality Date   CHOLECYSTECTOMY     SHOULDER ARTHROSCOPY Right 11/06/2019   Procedure: ARTHROSCOPY RIGHT SHOULDER, DEBRIDEMENT X TWO STRUCTURES; OPEN ROTATOR CUFF REPAIR;  Surgeon: Carole Civil, MD;  Location: [facility];  Service: Orthopedics;  Laterality: Right;     OB History   No obstetric history on file.     Family History  Problem Relation Age of Onset   Hyperlipidemia Mother    Heart disease Father    Lung cancer Maternal Aunt    Brain cancer Maternal Aunt    Bone cancer Maternal Aunt     Social History   Tobacco Use   Smoking status: Never   Smokeless tobacco: Never  Vaping Use   Vaping Use: Never used  Substance Use Topics   Alcohol use: No   Drug use: No    Home Medications Prior to Admission medications   Medication Sig Start Date End Date Taking? Authorizing Provider  atorvastatin (LIPITOR) 20 MG tablet TAKE 1 TABLET (20 MG TOTAL) BY MOUTH DAILY. NEEDS OFFICE VISIT 12/10/20   Ronnie Doss M, DO  cholecalciferol (VITAMIN D3) 25 MCG (1000 UNIT) tablet Take 1,000 Units by mouth daily.    [provider]  ibuprofen (ADVIL) 800 MG tablet TAKE 1 TABLET BY MOUTH EVERY 8 HOURS AS  NEEDED Patient not taking: Reported on 08/26/2020 08/26/20   Carole Civil, MD  magnesium oxide (MAG-OX) 400 MG tablet Take 400 mg by mouth daily.    [provider]  medroxyPROGESTERone (DEPO-PROVERA) 150 MG/ML injection Inject 1 mL (150 mg total) into the muscle every 3 (three) months. 08/02/20   Ronnie Doss M, DO  meloxicam (MOBIC) 7.5 MG tablet TAKE 1 TABLET BY MOUTH EVERY DAY 08/30/20   Carole Civil, MD  Pumpkin Seed-Soy Germ (AZO BLADDER CONTROL/GO-LESS) CAPS Take 1 capsule by mouth at bedtime.     [provider]  tiZANidine (ZANAFLEX) 4 MG tablet TAKE 1 TABLET (4 MG TOTAL) BY MOUTH DAILY. 06/21/20    Carole Civil, MD    Allergies    Patient has no known allergies.  Review of Systems   Review of Systems  Constitutional:  Negative for fatigue and fever.  HENT:  Negative for ear discharge, ear pain and trouble swallowing.   Eyes:  Positive for visual disturbance. Negative for photophobia, pain, redness and itching.  Gastrointestinal:  Negative for nausea and vomiting.  Neurological:  Positive for headaches.   Physical Exam Updated Vital Signs BP 134/86   Pulse 71   Temp 98.6 F (37 C) (Oral)   Resp 16   SpO2 100%   Physical Exam Vitals and nursing note reviewed. Exam conducted with a chaperone present.  Constitutional:      General: She is not in acute distress.    Appearance: Normal appearance.  HENT:     Head: Normocephalic and atraumatic.  Eyes:     General: No scleral icterus.       Right eye: No discharge.        Left eye: No discharge.     Extraocular Movements: Extraocular movements intact.     Conjunctiva/sclera: Conjunctivae normal.     Pupils: Pupils are equal, round, and reactive to light.     Comments: Right-sided proptosis, see photo.  EOMI, no pain with movements.  No nystagmus.  Skin:    Coloration: Skin is not jaundiced.  Neurological:     Mental Status: She is alert. Mental status is at baseline.     Coordination: Coordination normal.     Comments: Cranial nerves III through XII are grossly intact.     ED Results / Procedures / Treatments   Labs (all labs ordered are listed, but only abnormal results are displayed) Labs Reviewed  BASIC METABOLIC PANEL - Abnormal; Notable for the following components:      Result Value   Glucose, Bld 104 (*)    Calcium 11.3 (*)    All other components within normal limits  CBC WITH DIFFERENTIAL/PLATELET    EKG None  Radiology No results found.  Procedures Procedures   Medications Ordered in ED Medications - No data to display  ED Course  I have reviewed the triage vital signs and the  nursing notes.  Pertinent labs & imaging results that were available during my care of the patient were reviewed by me and considered in my medical decision making (see chart for details).  Clinical Course as of 01/21/21 1711  Fri Jan 21, 2021  [id number] Basic metabolic panel(!) No electrolyte derangement, kidney function is appropriate for contrast [HS]  1339 CBC with Differential No anemia, no leukocytosis [HS]  1555 Re-paged neurosurgery as it has been 40 minutes. [HS]    Clinical Course User Index [HS] Sherrill Raring, PA-C   MDM Rules/Calculators/A&P  Patient has minor proptosis of the right, subjectively decreased visual acuity to the left eye.  EOMI, no focal deficits.    Patient loss has been an ongoing symptom for the last 2 days, this makes febrile and below take event less likely.  Also doubt papilledema for same reason.  Differential includes giant cell arteritis, carotid artery disease, retinal vein occlusion, retinal migraine, optic neuropathy, papilledema, optic nerve compression.  Also possibility of a mass cranially.  Does not think the proptosis is necessarily related to the left sided vision loss.  Given that it has been ongoing for multiple months, I do not suspect this is an emergent pathology. Additionally, she denies any vision changes to the right eye where proptosis is occuring.  MR was notable for meningiomas vs. Metastatic disease. Will consult with her ophthalmologist Dr. Julian Reil and Neurosurgery.   Dr. Alanda Slim states that this is beyond his capacity, that if ophthalmology is needed it will have to come from [facility], he recommends following up with neurosurgery.  Spoke with Dr. Zada Finders with neurosurgery.  He states the patient is going to need a metastatic work-up with staging, advised admit to the hospitalist service.  Hospitalist service agreed to admit the patient.  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  Patient would prefer to do outpatient management, hospitalist and neurosurgeon are aware and agree with this course of plan.  Return precautions discussed, patient is appropriate for discharge at this time.  Final Clinical Impression(s) / ED Diagnoses Final diagnoses:  None    Rx / DC Orders ED Discharge Orders     None        Sherrill Raring, PA-C 01/21/21 1639    Sherrill Raring, PA-C 01/21/21 1712    Malvin Johns, MD 01/22/21 [phone]

## 2021-01-27 ENCOUNTER — Other Ambulatory Visit: Payer: Self-pay | Admitting: Neurological Surgery

## 2021-02-03 NOTE — Progress Notes (Signed)
Surgical Instructions    Your procedure is scheduled on Friday, September 1st, 2022.    Report to [facility] Main Entrance "A" at 05:30 A.M., then check in with the Admitting office.  Call this number if you have problems the morning of surgery:  [phone]   If you have any questions prior to your surgery date call [phone]: Open Monday-Friday 8am-4pm    Remember:  Do not eat or drink after midnight the night before your surgery    Take these medicines the morning of surgery with A SIP OF WATER:  atorvastatin (LIPITOR)  dexamethasone (DECADRON)   If needed:  acetaminophen (TYLENOL)    As of today, STOP taking any Aspirin (unless otherwise instructed by your surgeon) Aleve, Naproxen, Ibuprofen, Motrin, Advil, Goody's, BC's, all herbal medications, fish oil, and all vitamins.          Do not wear jewelry or makeup Do not wear lotions, powders, perfumes, or deodorant. Do not shave 48 hours prior to surgery.   Do not bring valuables to the hospital. DO Not wear nail polish, gel polish, artificial nails, or any other type of covering on natural nails including finger and toenails. If patients have artificial nails, gel coating, etc. that need to be removed by a nail salon please have this removed prior to surgery or surgery may need to be canceled/delayed if the surgeon/ anesthesia feels like the patient is unable to be adequately monitored.             [facility] is not responsible for any belongings or valuables.  Do NOT Smoke (Tobacco/Vaping) or drink Alcohol 24 hours prior to your procedure If you use a CPAP at night, you may bring all equipment for your overnight stay.   Contacts, glasses, dentures or bridgework may not be worn into surgery, please bring cases for these belongings   For patients admitted to the hospital, discharge time will be determined by your treatment team.   Patients discharged the day of surgery will not be allowed to drive home, and someone  needs to stay with them for 24 hours.  ONLY 1 SUPPORT PERSON MAY BE PRESENT WHILE YOU ARE IN SURGERY. IF YOU ARE TO BE ADMITTED ONCE YOU ARE IN YOUR ROOM YOU WILL BE ALLOWED TWO (2) VISITORS.  Minor children may have two parents present. Special consideration for safety and communication needs will be reviewed on a case by case basis.  Special instructions:    Oral Hygiene is also important to reduce your risk of infection.  Remember - BRUSH YOUR TEETH THE MORNING OF SURGERY WITH YOUR REGULAR TOOTHPASTE   [facility]- Preparing For Surgery  Before surgery, you can play an important role. Because skin is not sterile, your skin needs to be as free of germs as possible. You can reduce the number of germs on your skin by washing with CHG (chlorahexidine gluconate) Soap before surgery.  CHG is an antiseptic cleaner which kills germs and bonds with the skin to continue killing germs even after washing.     Please do not use if you have an allergy to CHG or antibacterial soaps. If your skin becomes reddened/irritated stop using the CHG.  Do not shave (including legs and underarms) for at least 48 hours prior to first CHG shower. It is OK to shave your face.  Please follow these instructions carefully.     Shower the NIGHT BEFORE SURGERY and the MORNING OF SURGERY with CHG Soap.   If you  chose to wash your hair, wash your hair first as usual with your normal shampoo. After you shampoo, rinse your hair and body thoroughly to remove the shampoo.  Then [employer] and genitals (private parts) with your normal soap and rinse thoroughly to remove soap.  After that Use CHG Soap as you would any other liquid soap. You can apply CHG directly to the skin and wash gently with a scrungie or a clean washcloth.   Apply the CHG Soap to your body ONLY FROM THE NECK DOWN.  Do not use on open wounds or open sores. Avoid contact with your eyes, ears, mouth and genitals (private parts). Wash Face and genitals (private  parts)  with your normal soap.   Wash thoroughly, paying special attention to the area where your surgery will be performed.  Thoroughly rinse your body with warm water from the neck down.  DO NOT shower/wash with your normal soap after using and rinsing off the CHG Soap.  Pat yourself dry with a CLEAN TOWEL.  Wear CLEAN PAJAMAS to bed the night before surgery  Place CLEAN SHEETS on your bed the night before your surgery  DO NOT SLEEP WITH PETS.   Day of Surgery:  Take a shower with CHG soap. Wear Clean/Comfortable clothing the morning of surgery Do not apply any deodorants/lotions.   Remember to brush your teeth WITH YOUR REGULAR TOOTHPASTE.   Please read over the following fact sheets that you were given.

## 2021-02-04 ENCOUNTER — Other Ambulatory Visit: Payer: Self-pay

## 2021-02-04 ENCOUNTER — Encounter (HOSPITAL_COMMUNITY)
Admission: RE | Admit: 2021-02-04 | Discharge: 2021-02-04 | Disposition: A | Discharge status: Home / Self Care | Payer: Commercial Managed Care - PPO | Source: Ambulatory Visit | Attending: Neurological Surgery | Admitting: Neurological Surgery

## 2021-02-04 ENCOUNTER — Encounter (HOSPITAL_COMMUNITY): Payer: Self-pay

## 2021-02-04 DIAGNOSIS — Z01810 Encounter for preprocedural cardiovascular examination: Secondary | ICD-10-CM | POA: Diagnosis not present

## 2021-02-04 HISTORY — DX: Headache, unspecified: R51.9

## 2021-02-04 NOTE — Progress Notes (Addendum)
PCP - Ronnie Doss, DO Cardiologist - Had an irregular EKG and went to see a cardiologist 4-5 years ago but no further is follow up needed now   Chest x-ray - Not indicated EKG - 02/04/21 Stress Test - 10 years ago ECHO - >10 years ago Cardiac Cath - Denies  Sleep Study -Denies no OSA   DM - Denies  COVID TEST- Asked to go on Aug 30 or 31st.   Anesthesia review: No  Patient denies shortness of breath, fever, cough and chest pain at PAT appointment   All instructions explained to the patient, with a verbal understanding of the material. Patient agrees to go over the instructions while at home for a better understanding. Patient also instructed to wear a mask while in public after being tested for COVID-19. The opportunity to ask questions was provided.

## 2021-02-09 ENCOUNTER — Telehealth: Payer: Self-pay | Admitting: Family Medicine

## 2021-02-09 NOTE — Telephone Encounter (Signed)
Please advise, patient recently diagnosed with brain tumor.

## 2021-02-09 NOTE — Telephone Encounter (Signed)
Pt has questions about how the surgery she has coming up will impact her depo shot. Please call back and advise.

## 2021-02-15 ENCOUNTER — Other Ambulatory Visit: Payer: Self-pay | Admitting: Neurological Surgery

## 2021-02-16 LAB — SARS CORONAVIRUS 2 (TAT 6-24 HRS): SARS Coronavirus 2: NEGATIVE

## 2021-02-17 ENCOUNTER — Other Ambulatory Visit: Payer: Self-pay

## 2021-02-17 ENCOUNTER — Ambulatory Visit (INDEPENDENT_AMBULATORY_CARE_PROVIDER_SITE_OTHER): Payer: Commercial Managed Care - PPO

## 2021-02-17 DIAGNOSIS — Z3042 Encounter for surveillance of injectable contraceptive: Secondary | ICD-10-CM

## 2021-02-17 NOTE — Progress Notes (Signed)
Medroxyprogesterone injection given to left upper outer quadrant.  Patient tolerated well. 

## 2021-02-18 ENCOUNTER — Other Ambulatory Visit: Payer: Self-pay

## 2021-02-18 ENCOUNTER — Encounter (HOSPITAL_COMMUNITY): Payer: Self-pay | Admitting: Neurological Surgery

## 2021-02-18 ENCOUNTER — Inpatient Hospital Stay (HOSPITAL_COMMUNITY): Payer: Commercial Managed Care - PPO | Admitting: Anesthesiology

## 2021-02-18 ENCOUNTER — Inpatient Hospital Stay (HOSPITAL_COMMUNITY)
Admission: RE | Admit: 2021-02-18 | Discharge: 2021-02-21 | DRG: 026 | Disposition: A | Discharge status: Home / Self Care | Payer: Commercial Managed Care - PPO | Attending: Neurological Surgery | Admitting: Neurological Surgery

## 2021-02-18 ENCOUNTER — Encounter (HOSPITAL_COMMUNITY)
Admission: RE | Disposition: A | Discharge status: Home / Self Care | Payer: Self-pay | Source: Home / Self Care | Attending: Neurological Surgery

## 2021-02-18 DIAGNOSIS — D62 Acute posthemorrhagic anemia: Secondary | ICD-10-CM | POA: Diagnosis not present

## 2021-02-18 DIAGNOSIS — K219 Gastro-esophageal reflux disease without esophagitis: Secondary | ICD-10-CM | POA: Diagnosis present

## 2021-02-18 DIAGNOSIS — E785 Hyperlipidemia, unspecified: Secondary | ICD-10-CM | POA: Diagnosis present

## 2021-02-18 DIAGNOSIS — Z808 Family history of malignant neoplasm of other organs or systems: Secondary | ICD-10-CM | POA: Diagnosis not present

## 2021-02-18 DIAGNOSIS — Z801 Family history of malignant neoplasm of trachea, bronchus and lung: Secondary | ICD-10-CM | POA: Diagnosis not present

## 2021-02-18 DIAGNOSIS — D496 Neoplasm of unspecified behavior of brain: Secondary | ICD-10-CM | POA: Diagnosis present

## 2021-02-18 DIAGNOSIS — R400 Somnolence: Secondary | ICD-10-CM | POA: Diagnosis not present

## 2021-02-18 DIAGNOSIS — D329 Benign neoplasm of meninges, unspecified: Principal | ICD-10-CM | POA: Diagnosis present

## 2021-02-18 DIAGNOSIS — Z83438 Family history of other disorder of lipoprotein metabolism and other lipidemia: Secondary | ICD-10-CM

## 2021-02-18 DIAGNOSIS — Z8249 Family history of ischemic heart disease and other diseases of the circulatory system: Secondary | ICD-10-CM

## 2021-02-18 DIAGNOSIS — H547 Unspecified visual loss: Secondary | ICD-10-CM | POA: Diagnosis present

## 2021-02-18 HISTORY — PX: APPLICATION OF CRANIAL NAVIGATION: SHX6578

## 2021-02-18 HISTORY — PX: CRANIOTOMY: SHX93

## 2021-02-18 LAB — POCT I-STAT 7, (LYTES, BLD GAS, ICA,H+H)
Acid-base deficit: 4 mmol/L — ABNORMAL HIGH (ref 0.0–2.0)
Acid-base deficit: 4 mmol/L — ABNORMAL HIGH (ref 0.0–2.0)
Acid-base deficit: 6 mmol/L — ABNORMAL HIGH (ref 0.0–2.0)
Acid-base deficit: 4 mmol/L — ABNORMAL HIGH (ref 0.0–2.0)
Acid-base deficit: 4 mmol/L — ABNORMAL HIGH (ref 0.0–2.0)
Bicarbonate: 19.9 mmol/L — ABNORMAL LOW (ref 20.0–28.0)
Bicarbonate: 19.1 mmol/L — ABNORMAL LOW (ref 20.0–28.0)
Bicarbonate: 20 mmol/L (ref 20.0–28.0)
Bicarbonate: 20.8 mmol/L (ref 20.0–28.0)
Bicarbonate: 20.3 mmol/L (ref 20.0–28.0)
Calcium, Ion: 1.34 mmol/L (ref 1.15–1.40)
Calcium, Ion: 1.33 mmol/L (ref 1.15–1.40)
Calcium, Ion: 1.34 mmol/L (ref 1.15–1.40)
Calcium, Ion: 1.36 mmol/L (ref 1.15–1.40)
Calcium, Ion: 1.36 mmol/L (ref 1.15–1.40)
HCT: 27 % — ABNORMAL LOW (ref 36.0–46.0)
HCT: 26 % — ABNORMAL LOW (ref 36.0–46.0)
HCT: 28 % — ABNORMAL LOW (ref 36.0–46.0)
HCT: 30 % — ABNORMAL LOW (ref 36.0–46.0)
HCT: 28 % — ABNORMAL LOW (ref 36.0–46.0)
Hemoglobin: 9.2 g/dL — ABNORMAL LOW (ref 12.0–15.0)
Hemoglobin: 8.8 g/dL — ABNORMAL LOW (ref 12.0–15.0)
Hemoglobin: 9.5 g/dL — ABNORMAL LOW (ref 12.0–15.0)
Hemoglobin: 10.2 g/dL — ABNORMAL LOW (ref 12.0–15.0)
Hemoglobin: 9.5 g/dL — ABNORMAL LOW (ref 12.0–15.0)
O2 Saturation: 100 %
O2 Saturation: 100 %
O2 Saturation: 100 %
O2 Saturation: 100 %
O2 Saturation: 100 %
Patient temperature: 36.3
Patient temperature: 36.5
Patient temperature: 36.8
Patient temperature: 37.4
Patient temperature: 37.5
Potassium: 4.2 mmol/L (ref 3.5–5.1)
Potassium: 4.2 mmol/L (ref 3.5–5.1)
Potassium: 4.4 mmol/L (ref 3.5–5.1)
Potassium: 4.6 mmol/L (ref 3.5–5.1)
Potassium: 4.5 mmol/L (ref 3.5–5.1)
Sodium: 140 mmol/L (ref 135–145)
Sodium: 140 mmol/L (ref 135–145)
Sodium: 141 mmol/L (ref 135–145)
Sodium: 142 mmol/L (ref 135–145)
Sodium: 142 mmol/L (ref 135–145)
TCO2: 21 mmol/L — ABNORMAL LOW (ref 22–32)
TCO2: 20 mmol/L — ABNORMAL LOW (ref 22–32)
TCO2: 21 mmol/L — ABNORMAL LOW (ref 22–32)
TCO2: 22 mmol/L (ref 22–32)
TCO2: 21 mmol/L — ABNORMAL LOW (ref 22–32)
pCO2 arterial: 32 mmHg (ref 32.0–48.0)
pCO2 arterial: 32.4 mmHg (ref 32.0–48.0)
pCO2 arterial: 33.6 mmHg (ref 32.0–48.0)
pCO2 arterial: 37.2 mmHg (ref 32.0–48.0)
pCO2 arterial: 36 mmHg (ref 32.0–48.0)
pH, Arterial: 7.398 (ref 7.350–7.450)
pH, Arterial: 7.363 (ref 7.350–7.450)
pH, Arterial: 7.396 (ref 7.350–7.450)
pH, Arterial: 7.357 (ref 7.350–7.450)
pH, Arterial: 7.362 (ref 7.350–7.450)
pO2, Arterial: 293 mmHg — ABNORMAL HIGH (ref 83.0–108.0)
pO2, Arterial: 287 mmHg — ABNORMAL HIGH (ref 83.0–108.0)
pO2, Arterial: 297 mmHg — ABNORMAL HIGH (ref 83.0–108.0)
pO2, Arterial: 286 mmHg — ABNORMAL HIGH (ref 83.0–108.0)
pO2, Arterial: 292 mmHg — ABNORMAL HIGH (ref 83.0–108.0)

## 2021-02-18 LAB — CBC
HCT: 33.5 % — ABNORMAL LOW (ref 36.0–46.0)
Hemoglobin: 11.4 g/dL — ABNORMAL LOW (ref 12.0–15.0)
MCH: 29.7 pg (ref 26.0–34.0)
MCHC: 34 g/dL (ref 30.0–36.0)
MCV: 87.2 fL (ref 80.0–100.0)
Platelets: 242 10*3/uL (ref 150–400)
RBC: 3.84 MIL/uL — ABNORMAL LOW (ref 3.87–5.11)
RDW: 12.8 % (ref 11.5–15.5)
WBC: 20 10*3/uL — ABNORMAL HIGH (ref 4.0–10.5)
nRBC: 0 % (ref 0.0–0.2)

## 2021-02-18 LAB — CREATININE, SERUM
Creatinine, Ser: 0.87 mg/dL (ref 0.44–1.00)
GFR, Estimated: >60 mL/min (ref >60)

## 2021-02-18 LAB — ABO/RH: ABO/RH(D): B POS

## 2021-02-18 LAB — POCT PREGNANCY, URINE: Preg Test, Ur: NEGATIVE

## 2021-02-18 LAB — PREPARE RBC (CROSSMATCH)

## 2021-02-18 SURGERY — CRANIOTOMY TUMOR EXCISION
Anesthesia: General | Site: Head | Laterality: Left

## 2021-02-18 MED ORDER — PROPOFOL 10 MG/ML IV BOLUS
INTRAVENOUS | Status: DC | PRN
  Administered 2021-02-18: 50 mg via INTRAVENOUS
  Administered 2021-02-18: 150 mg via INTRAVENOUS

## 2021-02-18 MED ORDER — ONDANSETRON HCL 4 MG/2ML IJ SOLN
INTRAMUSCULAR | Status: DC | PRN
  Administered 2021-02-18: 4 mg via INTRAVENOUS

## 2021-02-18 MED ORDER — SODIUM CHLORIDE 0.9 % IV SOLN
0.0125 ug/kg/min | INTRAVENOUS | Status: AC
  Administered 2021-02-18: .1 ug/kg/min via INTRAVENOUS
  Filled 2021-02-18: qty 2000

## 2021-02-18 MED ORDER — SODIUM CHLORIDE 0.9 % IR SOLN
Status: DC | PRN
  Administered 2021-02-18 (×2): 1000 mL

## 2021-02-18 MED ORDER — PHENYLEPHRINE HCL-NACL 20-0.9 MG/250ML-% IV SOLN
INTRAVENOUS | Status: DC | PRN
Start: 2021-02-18 — End: 2021-02-18
  Administered 2021-02-18: 40 ug/min via INTRAVENOUS
  Administered 2021-02-18: 25 ug/min via INTRAVENOUS

## 2021-02-18 MED ORDER — HYDROCODONE-ACETAMINOPHEN 5-325 MG PO TABS
1.0000 | ORAL_TABLET | ORAL | Status: DC | PRN
  Administered 2021-02-18 – 2021-02-21 (×8): 1 via ORAL
  Filled 2021-02-18 (×8): qty 1

## 2021-02-18 MED ORDER — SODIUM CHLORIDE 0.9 % IV SOLN: INTRAVENOUS | Status: DC | PRN

## 2021-02-18 MED ORDER — SCOPOLAMINE 1 MG/3DAYS TD PT72
MEDICATED_PATCH | TRANSDERMAL | Status: AC
  Filled 2021-02-18: qty 1

## 2021-02-18 MED ORDER — MANNITOL 25 % IV SOLN
INTRAVENOUS | Status: DC | PRN
  Administered 2021-02-18 (×3): 12.5 g via INTRAVENOUS

## 2021-02-18 MED ORDER — LIDOCAINE 2% (20 MG/ML) 5 ML SYRINGE
INTRAMUSCULAR | Status: AC
  Filled 2021-02-18: qty 5

## 2021-02-18 MED ORDER — CHLORHEXIDINE GLUCONATE CLOTH 2 % EX PADS
6.0000 | MEDICATED_PAD | Freq: Every day | CUTANEOUS | Status: DC
  Administered 2021-02-18 – 2021-02-20 (×3): 6 via TOPICAL

## 2021-02-18 MED ORDER — HYDROMORPHONE HCL 1 MG/ML IJ SOLN
INTRAMUSCULAR | Status: AC
  Filled 2021-02-18: qty 0.5

## 2021-02-18 MED ORDER — ACETAMINOPHEN 325 MG PO TABS
650.0000 mg | ORAL_TABLET | ORAL | Status: DC | PRN
  Administered 2021-02-19: 650 mg via ORAL
  Filled 2021-02-18: qty 2

## 2021-02-18 MED ORDER — DROPERIDOL 2.5 MG/ML IJ SOLN
INTRAMUSCULAR | Status: DC | PRN
  Administered 2021-02-18: .625 mg via INTRAVENOUS

## 2021-02-18 MED ORDER — CEFAZOLIN SODIUM-DEXTROSE 2-4 GM/100ML-% IV SOLN
2.0000 g | Freq: Three times a day (TID) | INTRAVENOUS | Status: AC
  Administered 2021-02-18 – 2021-02-19 (×2): 2 g via INTRAVENOUS
  Filled 2021-02-18 (×2): qty 100

## 2021-02-18 MED ORDER — ROCURONIUM BROMIDE 10 MG/ML (PF) SYRINGE
PREFILLED_SYRINGE | INTRAVENOUS | Status: DC | PRN
  Administered 2021-02-18: 100 mg via INTRAVENOUS
  Administered 2021-02-18: 50 mg via INTRAVENOUS
  Administered 2021-02-18: 20 mg via INTRAVENOUS
  Administered 2021-02-18: 50 mg via INTRAVENOUS

## 2021-02-18 MED ORDER — PHENYLEPHRINE 40 MCG/ML (10ML) SYRINGE FOR IV PUSH (FOR BLOOD PRESSURE SUPPORT)
PREFILLED_SYRINGE | INTRAVENOUS | Status: DC | PRN
  Administered 2021-02-18 (×2): 80 ug via INTRAVENOUS

## 2021-02-18 MED ORDER — PHENYLEPHRINE 40 MCG/ML (10ML) SYRINGE FOR IV PUSH (FOR BLOOD PRESSURE SUPPORT)
PREFILLED_SYRINGE | INTRAVENOUS | Status: AC
  Filled 2021-02-18: qty 10

## 2021-02-18 MED ORDER — FENTANYL CITRATE (PF) 250 MCG/5ML IJ SOLN
INTRAMUSCULAR | Status: DC | PRN
  Administered 2021-02-18: 100 ug via INTRAVENOUS
  Administered 2021-02-18 (×2): 50 ug via INTRAVENOUS

## 2021-02-18 MED ORDER — 0.9 % SODIUM CHLORIDE (POUR BTL) OPTIME
TOPICAL | Status: DC | PRN
  Administered 2021-02-18 (×3): 1000 mL

## 2021-02-18 MED ORDER — THROMBIN 5000 UNITS EX SOLR: OROMUCOSAL | Status: DC | PRN

## 2021-02-18 MED ORDER — SUGAMMADEX SODIUM 200 MG/2ML IV SOLN
INTRAVENOUS | Status: DC | PRN
  Administered 2021-02-18: 200 mg via INTRAVENOUS

## 2021-02-18 MED ORDER — LABETALOL HCL 5 MG/ML IV SOLN
10.0000 mg | INTRAVENOUS | Status: DC | PRN
Start: 2021-02-18 — End: 2021-02-21

## 2021-02-18 MED ORDER — DOXYLAMINE SUCCINATE (SLEEP) 25 MG PO TABS
25.0000 mg | ORAL_TABLET | Freq: Every evening | ORAL | Status: DC | PRN
  Filled 2021-02-18: qty 1

## 2021-02-18 MED ORDER — PROMETHAZINE HCL 25 MG PO TABS
12.5000 mg | ORAL_TABLET | ORAL | Status: DC | PRN
Start: 2021-02-18 — End: 2021-02-21
  Administered 2021-02-18: 25 mg via ORAL
  Filled 2021-02-18: qty 1

## 2021-02-18 MED ORDER — DEXAMETHASONE SODIUM PHOSPHATE 10 MG/ML IJ SOLN
INTRAMUSCULAR | Status: AC
  Filled 2021-02-18: qty 1

## 2021-02-18 MED ORDER — SCOPOLAMINE 1 MG/3DAYS TD PT72
MEDICATED_PATCH | TRANSDERMAL | Status: DC | PRN
  Administered 2021-02-18: 1 via TRANSDERMAL

## 2021-02-18 MED ORDER — PHENYLEPHRINE HCL (PRESSORS) 10 MG/ML IV SOLN
INTRAVENOUS | Status: AC
  Filled 2021-02-18: qty 2

## 2021-02-18 MED ORDER — ONDANSETRON HCL 4 MG/2ML IJ SOLN
4.0000 mg | INTRAMUSCULAR | Status: DC | PRN
  Administered 2021-02-18 – 2021-02-20 (×8): 4 mg via INTRAVENOUS
  Filled 2021-02-18 (×8): qty 2

## 2021-02-18 MED ORDER — ATORVASTATIN CALCIUM 10 MG PO TABS
20.0000 mg | ORAL_TABLET | Freq: Every day | ORAL | Status: DC
  Administered 2021-02-18 – 2021-02-21 (×4): 20 mg via ORAL
  Filled 2021-02-18 (×4): qty 2

## 2021-02-18 MED ORDER — LACTATED RINGERS IV SOLN: INTRAVENOUS | Status: DC

## 2021-02-18 MED ORDER — CEFAZOLIN SODIUM-DEXTROSE 2-4 GM/100ML-% IV SOLN
2.0000 g | INTRAVENOUS | Status: AC
  Administered 2021-02-18 (×2): 2 g via INTRAVENOUS
  Filled 2021-02-18: qty 100

## 2021-02-18 MED ORDER — BACITRACIN ZINC 500 UNIT/GM EX OINT
TOPICAL_OINTMENT | CUTANEOUS | Status: AC
  Filled 2021-02-18: qty 28.35

## 2021-02-18 MED ORDER — ROCURONIUM BROMIDE 10 MG/ML (PF) SYRINGE
PREFILLED_SYRINGE | INTRAVENOUS | Status: AC
  Filled 2021-02-18: qty 20

## 2021-02-18 MED ORDER — PROPOFOL 10 MG/ML IV BOLUS
INTRAVENOUS | Status: AC
  Filled 2021-02-18: qty 20

## 2021-02-18 MED ORDER — DEXAMETHASONE SODIUM PHOSPHATE 10 MG/ML IJ SOLN
INTRAMUSCULAR | Status: DC | PRN
  Administered 2021-02-18: 10 mg via INTRAVENOUS

## 2021-02-18 MED ORDER — THROMBIN 20000 UNITS EX SOLR: CUTANEOUS | Status: DC | PRN

## 2021-02-18 MED ORDER — CHLORHEXIDINE GLUCONATE CLOTH 2 % EX PADS: 6.0000 | MEDICATED_PAD | Freq: Once | CUTANEOUS | Status: DC

## 2021-02-18 MED ORDER — HYDROMORPHONE HCL 1 MG/ML IJ SOLN: 0.2500 mg | INTRAMUSCULAR | Status: DC | PRN

## 2021-02-18 MED ORDER — ACETAMINOPHEN 650 MG RE SUPP: 650.0000 mg | RECTAL | Status: DC | PRN

## 2021-02-18 MED ORDER — CHLORHEXIDINE GLUCONATE 0.12 % MT SOLN
15.0000 mL | Freq: Once | OROMUCOSAL | Status: AC
  Administered 2021-02-18: 15 mL via OROMUCOSAL
  Filled 2021-02-18: qty 15

## 2021-02-18 MED ORDER — MIDAZOLAM HCL 5 MG/5ML IJ SOLN
INTRAMUSCULAR | Status: DC | PRN
Start: 2021-02-18 — End: 2021-02-18
  Administered 2021-02-18: 1 mg via INTRAVENOUS

## 2021-02-18 MED ORDER — THROMBIN 5000 UNITS EX SOLR
CUTANEOUS | Status: AC
  Filled 2021-02-18: qty 5000

## 2021-02-18 MED ORDER — THROMBIN 20000 UNITS EX SOLR
CUTANEOUS | Status: AC
  Filled 2021-02-18: qty 20000

## 2021-02-18 MED ORDER — SODIUM CHLORIDE 0.9% IV SOLUTION: Freq: Once | INTRAVENOUS | Status: DC

## 2021-02-18 MED ORDER — LIDOCAINE 2% (20 MG/ML) 5 ML SYRINGE
INTRAMUSCULAR | Status: DC | PRN
  Administered 2021-02-18: 100 mg via INTRAVENOUS

## 2021-02-18 MED ORDER — POLYETHYLENE GLYCOL 3350 17 G PO PACK: 17.0000 g | PACK | Freq: Every day | ORAL | Status: DC | PRN

## 2021-02-18 MED ORDER — OXYCODONE HCL 5 MG PO TABS: 5.0000 mg | ORAL_TABLET | Freq: Once | ORAL | Status: DC | PRN

## 2021-02-18 MED ORDER — BACITRACIN ZINC 500 UNIT/GM EX OINT
TOPICAL_OINTMENT | CUTANEOUS | Status: DC | PRN
  Administered 2021-02-18 (×2): 1 via TOPICAL

## 2021-02-18 MED ORDER — LIDOCAINE-EPINEPHRINE 1 %-1:100000 IJ SOLN
INTRAMUSCULAR | Status: DC | PRN
  Administered 2021-02-18: 10 mL

## 2021-02-18 MED ORDER — SODIUM CHLORIDE 0.9 % IV SOLN
0.0500 ug/kg/min | INTRAVENOUS | Status: DC
  Filled 2021-02-18: qty 5000

## 2021-02-18 MED ORDER — PROMETHAZINE HCL 25 MG/ML IJ SOLN: 6.2500 mg | INTRAMUSCULAR | Status: DC | PRN

## 2021-02-18 MED ORDER — ACETAMINOPHEN 10 MG/ML IV SOLN: 1000.0000 mg | Freq: Once | INTRAVENOUS | Status: DC | PRN

## 2021-02-18 MED ORDER — ONDANSETRON HCL 4 MG PO TABS: 4.0000 mg | ORAL_TABLET | ORAL | Status: DC | PRN

## 2021-02-18 MED ORDER — DOCUSATE SODIUM 100 MG PO CAPS
100.0000 mg | ORAL_CAPSULE | Freq: Two times a day (BID) | ORAL | Status: DC
  Administered 2021-02-18 – 2021-02-21 (×6): 100 mg via ORAL
  Filled 2021-02-18 (×5): qty 1

## 2021-02-18 MED ORDER — ALBUMIN HUMAN 5 % IV SOLN
INTRAVENOUS | Status: DC | PRN
Start: 2021-02-18 — End: 2021-02-18

## 2021-02-18 MED ORDER — ORAL CARE MOUTH RINSE: 15.0000 mL | Freq: Once | OROMUCOSAL | Status: AC

## 2021-02-18 MED ORDER — MIDAZOLAM HCL 2 MG/2ML IJ SOLN
INTRAMUSCULAR | Status: AC
  Filled 2021-02-18: qty 2

## 2021-02-18 MED ORDER — FENTANYL CITRATE (PF) 250 MCG/5ML IJ SOLN
INTRAMUSCULAR | Status: AC
  Filled 2021-02-18: qty 5

## 2021-02-18 MED ORDER — HEPARIN SODIUM (PORCINE) 5000 UNIT/ML IJ SOLN
5000.0000 [IU] | Freq: Three times a day (TID) | INTRAMUSCULAR | Status: DC
  Administered 2021-02-20 – 2021-02-21 (×4): 5000 [IU] via SUBCUTANEOUS
  Filled 2021-02-18 (×4): qty 1

## 2021-02-18 MED ORDER — HYDROMORPHONE HCL 1 MG/ML IJ SOLN
INTRAMUSCULAR | Status: DC | PRN
  Administered 2021-02-18: .5 mg via INTRAVENOUS

## 2021-02-18 MED ORDER — PROMETHAZINE HCL 25 MG/ML IJ SOLN
INTRAMUSCULAR | Status: AC
  Administered 2021-02-18: 12.5 mg via INTRAVENOUS
  Filled 2021-02-18: qty 1

## 2021-02-18 MED ORDER — OXYCODONE HCL 5 MG/5ML PO SOLN: 5.0000 mg | Freq: Once | ORAL | Status: DC | PRN

## 2021-02-18 MED ORDER — HYDROMORPHONE HCL 1 MG/ML IJ SOLN
0.5000 mg | INTRAMUSCULAR | Status: DC | PRN
  Administered 2021-02-18 – 2021-02-21 (×12): 0.5 mg via INTRAVENOUS
  Filled 2021-02-18 (×12): qty 1

## 2021-02-18 SURGICAL SUPPLY — 105 items
APL SKNCLS STERI-STRIP NONHPOA (GAUZE/BANDAGES/DRESSINGS)
BAG COUNTER SPONGE SURGICOUNT (BAG) ×4 IMPLANT
BAG SPNG CNTER NS LX DISP (BAG) ×4
BAND INSRT 18 STRL LF DISP RB (MISCELLANEOUS)
BAND RUBBER #18 3X1/16 STRL (MISCELLANEOUS) IMPLANT
BENZOIN TINCTURE PRP APPL 2/3 (GAUZE/BANDAGES/DRESSINGS) IMPLANT
BLADE AVERAGE 25X9 (BLADE) ×1 IMPLANT
BLADE CLIPPER SURG (BLADE) ×3 IMPLANT
BLADE SAW GIGLI 16 STRL (MISCELLANEOUS) IMPLANT
BLADE SURG 15 STRL LF DISP TIS (BLADE) IMPLANT
BLADE SURG 15 STRL SS (BLADE)
BNDG CMPR 75X41 PLY HI ABS (GAUZE/BANDAGES/DRESSINGS)
BNDG GAUZE ELAST 4 BULKY (GAUZE/BANDAGES/DRESSINGS) IMPLANT
BNDG STRETCH 4X75 STRL LF (GAUZE/BANDAGES/DRESSINGS) IMPLANT
BUR ACORN 9.0 PRECISION (BURR) ×3 IMPLANT
BUR ROUND FLUTED 4 SOFT TCH (BURR) ×1 IMPLANT
BUR SABER TAPERED DIAMOND 2 (BURR) ×1 IMPLANT
BUR SPIRAL ROUTER 2.3 (BUR) ×3 IMPLANT
CANISTER SUCT 3000ML PPV (MISCELLANEOUS) ×6 IMPLANT
CANISTER SUCTION SONOPET IQ (CANNISTER) ×1 IMPLANT
CASSETTE SUCT IRRIG SONOPET IQ (MISCELLANEOUS) ×1 IMPLANT
CATH VENTRIC 35X38 W/TROCAR LG (CATHETERS) IMPLANT
CLIP TI MEDIUM 6 (CLIP) ×1 IMPLANT
CLIP VESOCCLUDE MED 6/CT (CLIP) IMPLANT
CNTNR URN SCR LID CUP LEK RST (MISCELLANEOUS) ×2 IMPLANT
CONT SPEC 4OZ STRL OR WHT (MISCELLANEOUS) ×3
COVER BURR HOLE 7 (Orthopedic Implant) ×1 IMPLANT
COVER BURR HOLE UNIV 10 (Orthopedic Implant) ×1 IMPLANT
COVER MAYO STAND STRL (DRAPES) IMPLANT
DECANTER SPIKE VIAL GLASS SM (MISCELLANEOUS) ×2 IMPLANT
DRAIN SUBARACHNOID (WOUND CARE) IMPLANT
DRAPE HALF SHEET 40X57 (DRAPES) ×3 IMPLANT
DRAPE MICROSCOPE LEICA (MISCELLANEOUS) ×1 IMPLANT
DRAPE NEUROLOGICAL W/INCISE (DRAPES) ×3 IMPLANT
DRAPE STERI IOBAN 125X83 (DRAPES) IMPLANT
DRAPE SURG 17X23 STRL (DRAPES) IMPLANT
DRAPE WARM FLUID 44X44 (DRAPES) ×3 IMPLANT
DRSG ADAPTIC 3X8 NADH LF (GAUZE/BANDAGES/DRESSINGS) IMPLANT
DRSG TELFA 3X8 NADH (GAUZE/BANDAGES/DRESSINGS) IMPLANT
DURAPREP 6ML APPLICATOR 50/CS (WOUND CARE) ×3 IMPLANT
ELECT REM PT RETURN 9FT ADLT (ELECTROSURGICAL) ×3
ELECTRODE REM PT RTRN 9FT ADLT (ELECTROSURGICAL) ×2 IMPLANT
EVACUATOR 1/8 PVC DRAIN (DRAIN) IMPLANT
EVACUATOR SILICONE 100CC (DRAIN) IMPLANT
FORCEPS BIPOLAR SPETZLER 8 1.0 (NEUROSURGERY SUPPLIES) ×3 IMPLANT
GAUZE 4X4 16PLY ~~LOC~~+RFID DBL (SPONGE) ×2 IMPLANT
GAUZE SPONGE 4X4 12PLY STRL (GAUZE/BANDAGES/DRESSINGS) IMPLANT
GLOVE EXAM NITRILE LRG STRL (GLOVE) IMPLANT
GLOVE EXAM NITRILE XS STR PU (GLOVE) IMPLANT
GLOVE SURG ENC MOIS LTX SZ7 (GLOVE) IMPLANT
GLOVE SURG LTX SZ7.5 (GLOVE) ×3 IMPLANT
GLOVE SURG UNDER POLY LF SZ7 (GLOVE) IMPLANT
GLOVE SURG UNDER POLY LF SZ7.5 (GLOVE) ×3 IMPLANT
GOWN STRL REUS W/ TWL LRG LVL3 (GOWN DISPOSABLE) ×4 IMPLANT
GOWN STRL REUS W/ TWL XL LVL3 (GOWN DISPOSABLE) IMPLANT
GOWN STRL REUS W/TWL 2XL LVL3 (GOWN DISPOSABLE) IMPLANT
GOWN STRL REUS W/TWL LRG LVL3 (GOWN DISPOSABLE) ×9
GOWN STRL REUS W/TWL XL LVL3 (GOWN DISPOSABLE)
HEMOSTAT POWDER KIT SURGIFOAM (HEMOSTASIS) ×3 IMPLANT
HEMOSTAT SURGICEL 2X14 (HEMOSTASIS) ×2 IMPLANT
HOOK DURA 1/2IN (MISCELLANEOUS) ×1 IMPLANT
IV NS 1000ML (IV SOLUTION) ×6
IV NS 1000ML BAXH (IV SOLUTION) ×2 IMPLANT
KIT BASIN OR (CUSTOM PROCEDURE TRAY) ×3 IMPLANT
KIT DRAIN CSF ACCUDRAIN (MISCELLANEOUS) IMPLANT
KIT TURNOVER KIT B (KITS) ×3 IMPLANT
KNIFE SONOPET IQ 11 APEX (ORTHOPEDIC DISPOSABLE SUPPLIES) ×1 IMPLANT
MARKER SPHERE PSV REFLC 13MM (MARKER) ×8 IMPLANT
NDL SPNL 18GX3.5 QUINCKE PK (NEEDLE) IMPLANT
NEEDLE HYPO 22GX1.5 SAFETY (NEEDLE) ×3 IMPLANT
NEEDLE SPNL 18GX3.5 QUINCKE PK (NEEDLE) IMPLANT
NS IRRIG 1000ML POUR BTL (IV SOLUTION) ×9 IMPLANT
PACK CRANIOTOMY CUSTOM (CUSTOM PROCEDURE TRAY) ×3 IMPLANT
PAD DRESSING TELFA 3X8 NADH (GAUZE/BANDAGES/DRESSINGS) IMPLANT
PATTIES SURGICAL .25X.25 (GAUZE/BANDAGES/DRESSINGS) IMPLANT
PATTIES SURGICAL .5 X.5 (GAUZE/BANDAGES/DRESSINGS) ×1 IMPLANT
PATTIES SURGICAL .5 X3 (DISPOSABLE) IMPLANT
PATTIES SURGICAL 1/4 X 3 (GAUZE/BANDAGES/DRESSINGS) IMPLANT
PATTIES SURGICAL 1X1 (DISPOSABLE) IMPLANT
PIN MAYFIELD SKULL DISP (PIN) ×3 IMPLANT
PLATE CRANIAL 12 2H RIGID UNI (Plate) ×1 IMPLANT
PLATE DOUBLE Y CMF 6H (Plate) ×1 IMPLANT
SCREW UNIII AXS SD 1.5X4 (Screw) ×15 IMPLANT
SLEEVE IRRIGATION ELITE 7 (MISCELLANEOUS) ×1 IMPLANT
SPECIMEN JAR SMALL (MISCELLANEOUS) IMPLANT
SPONGE NEURO XRAY DETECT 1X3 (DISPOSABLE) IMPLANT
SPONGE SURGIFOAM ABS GEL 100 (HEMOSTASIS) ×3 IMPLANT
SPONGE T-LAP 4X18 ~~LOC~~+RFID (SPONGE) ×1 IMPLANT
STAPLER VISISTAT 35W (STAPLE) ×3 IMPLANT
SUT ETHILON 3 0 FSL (SUTURE) IMPLANT
SUT ETHILON 3 0 PS 1 (SUTURE) IMPLANT
SUT MNCRL AB 3-0 PS2 18 (SUTURE) ×1 IMPLANT
SUT MON AB 3-0 SH 27 (SUTURE)
SUT MON AB 3-0 SH27 (SUTURE) IMPLANT
SUT NURALON 4 0 TR CR/8 (SUTURE) ×7 IMPLANT
SUT SILK 0 TIES 10X30 (SUTURE) IMPLANT
SUT VIC AB 2-0 CP2 18 (SUTURE) ×5 IMPLANT
TIP SONOPET IQ 12 BARRACUDA (TIP) ×1 IMPLANT
TOWEL GREEN STERILE (TOWEL DISPOSABLE) ×3 IMPLANT
TOWEL GREEN STERILE FF (TOWEL DISPOSABLE) ×3 IMPLANT
TRAY FOLEY MTR SLVR 16FR STAT (SET/KITS/TRAYS/PACK) ×3 IMPLANT
TUBE CONNECTING 12X1/4 (SUCTIONS) ×3 IMPLANT
TUBING IRRIGATION (MISCELLANEOUS) ×1 IMPLANT
UNDERPAD 30X36 HEAVY ABSORB (UNDERPADS AND DIAPERS) ×3 IMPLANT
WATER STERILE IRR 1000ML POUR (IV SOLUTION) ×3 IMPLANT

## 2021-02-18 NOTE — Anesthesia Postprocedure Evaluation (Signed)
Anesthesia Post Note  Patient: Laurie Kelley  Procedure(s) Performed: Left orbitozygomatic craniotomy for tumor resection  with Brain Lab (Left: Head) APPLICATION OF CRANIAL NAVIGATION (Left)     Patient location during evaluation: PACU Anesthesia Type: General Level of consciousness: awake and alert Pain management: pain level controlled Vital Signs Assessment: post-procedure vital signs reviewed and stable Respiratory status: spontaneous breathing, nonlabored ventilation, respiratory function stable and patient connected to nasal cannula oxygen Cardiovascular status: blood pressure returned to baseline and stable Postop Assessment: no apparent nausea or vomiting Anesthetic complications: no   No notable events documented.  Last Vitals:  Vitals:   02/18/21 1445 02/18/21 1515  BP: (!) 114/57 (!) 125/52  Pulse: 72 62  Resp: 19 19  Temp: (!) 36.1 C (!) 36.2 C  SpO2: 100% 100%    Last Pain:  Vitals:   02/18/21 1515  TempSrc: Oral  PainSc:                  [name]

## 2021-02-18 NOTE — Progress Notes (Signed)
Pt had another episode of vomiting just after Dilauded was given for pain.  '4mg'$  zofran given.  Pt was cleaned up and feels better.  Neuro intact.

## 2021-02-18 NOTE — Brief Op Note (Signed)
02/18/2021  2:16 PM  PATIENT:  Laurie Kelley  50 y.o. female  PRE-OPERATIVE DIAGNOSIS:  Brain tumor  POST-OPERATIVE DIAGNOSIS:  Brain tumor  PROCEDURE:  Procedure(s): Left orbitozygomatic craniotomy for tumor resection  with Brain Lab (Left) APPLICATION OF CRANIAL NAVIGATION (Left)  SURGEON:  Surgeon(s) and Role:    * Judith Part, MD - Primary    * Newman Pies, MD - Assisting  PHYSICIAN ASSISTANT:   ANESTHESIA:   general  EBL:  1800 mL   BLOOD ADMINISTERED:none  DRAINS: none   LOCAL MEDICATIONS USED:  LIDOCAINE   SPECIMEN:  Source of Specimen:  Left clinoidal tumor  DISPOSITION OF SPECIMEN:  PATHOLOGY  COUNTS:  YES  TOURNIQUET:  * No tourniquets in log *  DICTATION: .Note written in EPIC  PLAN OF CARE: Admit to inpatient   PATIENT DISPOSITION:  PACU - hemodynamically stable.   Delay start of Pharmacological VTE agent (>24hrs) due to surgical blood loss or risk of bleeding: yes

## 2021-02-18 NOTE — Op Note (Signed)
PATIENT: Laurie Kelley  DAY OF SURGERY: 02/18/21   PRE-OPERATIVE DIAGNOSIS:  Skull base meningioma   POST-OPERATIVE DIAGNOSIS:  Same   PROCEDURE:  Left orbitozygomatic craniotomy for resection of skull base meningioma, use of frameless stereotaxy   SURGEON:  Surgeon(s) and Role:    Judith Part, MD - Primary    Newman Pies, MD - Assisting   ANESTHESIA: ETGA   BRIEF HISTORY: This is a 50 year old woman who presented with progressive visual loss. 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 I recommended staged approaches to resect the tumor bilaterally. Her visual loss was worse on the left, so I recommended starting with the left side. This was discussed with the patient as well as risks, benefits, and alternatives and wished to proceed with surgery.   OPERATIVE DETAIL: The patient was taken to the operating room and placed on the OR table in the supine position. A formal time out was performed with two patient identifiers and confirmed the operative site. Anesthesia was induced by the anesthesia team. The [place] head holder was applied to the head and a registration array was attached to the [place]. This was co-registered with the patient's preoperative imaging, the fit appeared to be acceptable. Using frameless stereotaxy, the operative trajectory was planned and the incision was marked. Hair was clipped with surgical clippers over the incision and the area was then prepped and draped in a sterile fashion.  A linear incision was placed in the left side of the scalp from tragus to midline, behind the hairline. A subfascial dissection was used to expose the superior and lateral orbit. A craniotomy was performed by placing three burr holes, with one at the keyhole. The periorbita was dissected free and Tessiers were used to protect that intraorbital contents while lateral and superior orbitotomies were performed, followed by an osteotomy  across the orbital roof in the usual fashion. A one piece modified OZ flap was removed and set aside.  The bone was impressively hyperostotic, as expected. I was unable to drill across the thickened bone inferiorly and had to expand the craniotomy inferiorly after removing the bone flap. An extradural clinoidectomy was then performed with good decompression of the optic nerve while using an irrigating sheath on the drill. The dura was then opened and flapped anteriorly. The bone was diffusely hyperostotic, enlarged, and hypervascular. I used bone wax, occluded the enlarged MMA, and hemostatic agents as possible, but there was significant blood loss during the craniotomy from bone edges at all locations.   The tumor was identified and resected. I devascularized it by amputating the base of the tumor, inside the capsule. I then used the CUSA to debulk it and allow for safe dissection, followed by circumferential dissection. It was vascular, as expected, and appeared consistent with a meningioma. The ICA and optic N were identified and protected during dissection. After resection, good view of the contralateral ICA and ON was appreciated and and dura across the anterior skull base was coagulated.   Hemostasis was obtained and confirmed. The dura was closed with suture and gelfoam was used to fill the defect across the inferior aspect. The bone flap was plated with attention to the position of the orbit. It was secured with titanium plates and screws.  All instrument and sponge counts were correct, the incision was then closed in layers. The patient was then returned to anesthesia for emergence. No apparent complications at the completion of the procedure.  EBL:  1549m   DRAINS: none   SPECIMENS: Left clinoidal tumor   TJudith Part MD 02/18/21 7:40 AM

## 2021-02-18 NOTE — H&P (Signed)
Surgical H&P Update  HPI: 50 y.o. woman with large bilateral skull base meningioma(s), found during the workup of progressive visual loss. She feels her vision in the left eye has worsened since I last saw her, not severely, but noticeably. No other new symptoms. No changes in health since she was last seen.  PMHx:  Past Medical History:  Diagnosis Date   Chest pain in adult 07/31/2017   Esophageal reflux 08/18/2014   Gallstones    Headache    Hyperlipidemia with target LDL less than 100 08/18/2015   Internal hemorrhoids 08/18/2014   PONV (postoperative nausea and vomiting)    FamHx:  Family History  Problem Relation Age of Onset   Hyperlipidemia Mother    Heart disease Father    Lung cancer Maternal Aunt    Brain cancer Maternal Aunt    Bone cancer Maternal Aunt    SocHx:  reports that she has never smoked. She has never used smokeless tobacco. She reports that she does not drink alcohol and does not use drugs.  Physical Exam: AOx3, PERRL with mild asymmetry L>R, gaze conjugate, FS, visual fields with left temporal field cut, able to count fingers in the nasal field, no obvious field cut OD Strength 5/5 x4, SILTx4  Assesment/Plan: 50 y.o. woman with large bilateral skull base meningioma, here for stage 1 resection of the left sided component. Risks, benefits, and alternatives discussed and the patient would like to continue with surgery.  -OR today -4N post-op  Judith Part, MD 02/18/21 7:11 AM

## 2021-02-18 NOTE — Anesthesia Preprocedure Evaluation (Signed)
Anesthesia Evaluation  Patient identified by MRN, date of birth, ID band Patient awake    Reviewed: Allergy & Precautions, NPO status , Patient's Chart, lab work & pertinent test results  History of Anesthesia Complications (+) PONV  Airway Mallampati: II  TM Distance: >3 FB Neck ROM: Full    Dental no notable dental hx.    Pulmonary neg pulmonary ROS,    Pulmonary exam normal breath sounds clear to auscultation       Cardiovascular negative cardio ROS Normal cardiovascular exam Rhythm:Regular Rate:Normal     Neuro/Psych negative neurological ROS  negative psych ROS   GI/Hepatic negative GI ROS, Neg liver ROS,   Endo/Other  negative endocrine ROS  Renal/GU negative Renal ROS  negative genitourinary   Musculoskeletal negative musculoskeletal ROS (+)   Abdominal   Peds negative pediatric ROS (+)  Hematology negative hematology ROS (+)   Anesthesia Other Findings   Reproductive/Obstetrics negative OB ROS                             Anesthesia Physical Anesthesia Plan  ASA: 2  Anesthesia Plan: General   Post-op Pain Management:    Induction: Intravenous  PONV Risk Score and Plan: 4 or greater and Ondansetron, Dexamethasone, Midazolam, Scopolamine patch - Pre-op and Treatment may vary due to age or medical condition  Airway Management Planned: Oral ETT  Additional Equipment: Arterial line  Intra-op Plan:   Post-operative Plan: Extubation in OR  Informed Consent: I have reviewed the patients History and Physical, chart, labs and discussed the procedure including the risks, benefits and alternatives for the proposed anesthesia with the patient or authorized representative who has indicated his/her understanding and acceptance.     Dental advisory given  Plan Discussed with: CRNA and Surgeon  Anesthesia Plan Comments:         Anesthesia Quick Evaluation

## 2021-02-18 NOTE — Transfer of Care (Signed)
Immediate Anesthesia Transfer of Care Note  Patient: Laurie Kelley  Procedure(s) Performed: Left orbitozygomatic craniotomy for tumor resection  with Brain Lab (Left: Head) APPLICATION OF CRANIAL NAVIGATION (Left)  Patient Location: PACU  Anesthesia Type:General  Level of Consciousness: awake, drowsy and patient cooperative  Airway & Oxygen Therapy: Patient Spontanous Breathing and Patient connected to face mask oxygen  Post-op Assessment: Report given to RN, Post -op Vital signs reviewed and stable and Patient moving all extremities X 4  Post vital signs: Reviewed and stable  Last Vitals:  Vitals Value Taken Time  BP 114/55 02/18/21 1413  Temp    Pulse 94 02/18/21 1415  Resp 19 02/18/21 1415  SpO2 99 % 02/18/21 1415  Vitals shown include unvalidated device data.  Last Pain:  Vitals:   02/18/21 0618  TempSrc: Oral  PainSc:       Patients Stated Pain Goal: 2 ([id number])  Complications: No notable events documented.

## 2021-02-18 NOTE — Progress Notes (Signed)
Neurosurgery Service Post-operative progress note  Assessment & Plan: 50 y.o. woman s/p L OZ for tumor rsxn, seen in PACU, unable to assess visual acuity accurately yet, but awake/alert, Fcx4 and Ox2 with fluent speech.  -admit to 4N ICU -MRI tonight/tomorrow when able -advance diet / activity as tolerated  Laurie Kelley  02/18/21 3:44 PM

## 2021-02-18 NOTE — Anesthesia Procedure Notes (Signed)
Arterial Line Insertion Start/End9/07/2020 6:50 AM, 02/18/2021 7:10 AM Performed by: Gaylene Brooks, CRNA, CRNA  Patient location: Pre-op. Preanesthetic checklist: patient identified, IV checked, site marked, risks and benefits discussed, surgical consent, monitors and equipment checked, pre-op evaluation, timeout performed and anesthesia consent Lidocaine 1% used for infiltration Right, radial was placed Catheter size: 20 G Hand hygiene performed  and maximum sterile barriers used   Attempts: 1 Procedure performed without using ultrasound guided technique. Following insertion, dressing applied and Biopatch. Post procedure assessment: normal  Patient tolerated the procedure well with no immediate complications.

## 2021-02-18 NOTE — Anesthesia Procedure Notes (Signed)
Procedure Name: Intubation Date/Time: 02/18/2021 7:43 AM Performed by: Gaylene Brooks, CRNA Pre-anesthesia Checklist: Patient identified, Emergency Drugs available, Suction available and Patient being monitored Patient Re-evaluated:Patient Re-evaluated prior to induction Oxygen Delivery Method: Circle System Utilized Preoxygenation: Pre-oxygenation with 100% oxygen Induction Type: IV induction Ventilation: Mask ventilation without difficulty Laryngoscope Size: Miller and 2 Grade View: Grade I Tube type: Oral Tube size: 7.0 mm Number of attempts: 1 Airway Equipment and Method: Stylet and Oral airway Placement Confirmation: ETT inserted through vocal cords under direct vision, positive ETCO2 and breath sounds checked- equal and bilateral Secured at: 21 cm Tube secured with: Tape Dental Injury: Teeth and Oropharynx as per pre-operative assessment

## 2021-02-19 ENCOUNTER — Inpatient Hospital Stay (HOSPITAL_COMMUNITY): Payer: Commercial Managed Care - PPO

## 2021-02-19 LAB — RENAL FUNCTION PANEL
Albumin: 3.3 g/dL — ABNORMAL LOW (ref 3.5–5.0)
Anion gap: 5 (ref 5–15)
BUN: 8 mg/dL (ref 6–20)
CO2: 23 mmol/L (ref 22–32)
Calcium: 10.4 mg/dL — ABNORMAL HIGH (ref 8.9–10.3)
Chloride: 109 mmol/L (ref 98–111)
Creatinine, Ser: 0.86 mg/dL (ref 0.44–1.00)
GFR, Estimated: >60 mL/min (ref >60)
Glucose, Bld: 137 mg/dL — ABNORMAL HIGH (ref 70–99)
Phosphorus: 4 mg/dL (ref 2.5–4.6)
Potassium: 4.3 mmol/L (ref 3.5–5.1)
Sodium: 137 mmol/L (ref 135–145)

## 2021-02-19 LAB — CBC
HCT: 30.3 % — ABNORMAL LOW (ref 36.0–46.0)
Hemoglobin: 10.4 g/dL — ABNORMAL LOW (ref 12.0–15.0)
MCH: 30 pg (ref 26.0–34.0)
MCHC: 34.3 g/dL (ref 30.0–36.0)
MCV: 87.3 fL (ref 80.0–100.0)
Platelets: 214 10*3/uL (ref 150–400)
RBC: 3.47 MIL/uL — ABNORMAL LOW (ref 3.87–5.11)
RDW: 12.9 % (ref 11.5–15.5)
WBC: 16.6 10*3/uL — ABNORMAL HIGH (ref 4.0–10.5)
nRBC: 0 % (ref 0.0–0.2)

## 2021-02-19 MED ORDER — GADOBUTROL 1 MMOL/ML IV SOLN
8.0000 mL | Freq: Once | INTRAVENOUS | Status: AC | PRN
  Administered 2021-02-19: 8 mL via INTRAVENOUS

## 2021-02-19 NOTE — Evaluation (Signed)
Physical Therapy Evaluation Patient Details Name: Laurie Kelley MRN: [id number] DOB: 06-15-1971 Today's Date: 02/19/2021   History of Present Illness  This 50 y.o. 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   She underwent stage 1 resection of the Lt sided component.  PMH includes: reflux  Clinical Impression  PTA, patient lives alone and reports independence with mobility. Patient's father will be staying with her at discharge. Patient presents with generalized weakness, impaired balance, decreased activity tolerance, and pain. Patient severely limited by pain and nausea this session. Patient requires minA+2 for transfers this session. Patient will benefit from skilled PT services during acute stay to address listed deficits. Recommend HHPT following discharge to maximize functional mobility and safety.     Follow Up Recommendations Home health PT;Supervision for mobility/OOB    Equipment Recommendations  None recommended by PT    Recommendations for Other Services       Precautions / Restrictions Precautions Precautions: Fall Restrictions Weight Bearing Restrictions: No      Mobility  Bed Mobility Overal bed mobility: Needs Assistance Bed Mobility: Supine to Sit     Supine to sit: Min guard     General bed mobility comments: min guard for safety, patient able to advance LEs and trunk to EOB without physical assistance    Transfers Overall transfer level: Needs assistance Equipment used: 1 person hand held assist;2 person hand held assist Transfers: Sit to/from [facility] Sit to Stand: Min assist;+2 safety/equipment;+2 physical assistance Stand pivot transfers: Min assist;+2 safety/equipment       General transfer comment: Pt became diaphoretic with move to EOB and with transition to standing.  BP was stable, sp02 >93%  Ambulation/Gait             General Gait Details:  deferred due to patient dizzy, diaphoretic  Stairs            Wheelchair Mobility    Modified Rankin (Stroke Patients Only)       Balance Overall balance assessment: Needs assistance Sitting-balance support: Feet supported Sitting balance-Leahy Scale: Fair     Standing balance support: Single extremity supported Standing balance-Leahy Scale: Poor Standing balance comment: requires UE support                             Pertinent Vitals/Pain Pain Assessment: Faces Faces Pain Scale: Hurts even more Pain Location: headache Pain Descriptors / Indicators: Headache;Operative site guarding Pain Intervention(s): Monitored during session;Limited activity within patient's tolerance;Repositioned    Home Living Family/patient expects to be discharged to:: Private residence Living Arrangements: Alone Available Help at Discharge: Family;Available 24 hours/day Type of Home: House Home Access: Stairs to enter Entrance Stairs-Rails: None Entrance Stairs-Number of Steps: 2 Home Layout: One level Home Equipment: None      Prior Function Level of Independence: Independent         Comments: However, pt reports a progressive decline with her visual acuity. Works as Magazine features editor in jail     [place]: Right    Extremity/Trunk Assessment   Upper Extremity Assessment Upper Extremity Assessment: Defer to OT evaluation    Lower Extremity Assessment Lower Extremity Assessment: Generalized weakness    Cervical / Trunk Assessment Cervical / Trunk Assessment: Other exceptions Cervical / Trunk Exceptions: pt with signficant facial swelling  Communication   Communication: Expressive difficulties (low volume)  Cognition Arousal/Alertness:  Awake/alert Behavior During Therapy: Flat affect Overall Cognitive Status: Within Functional Limits for tasks assessed                                 General Comments: grossly WFL for  basic tasks      General Comments General comments (skin integrity, edema, etc.): father and daughter present during    Exercises     Assessment/Plan    PT Assessment Patient needs continued PT services  PT Problem List Decreased strength;Decreased activity tolerance;Decreased balance;Decreased mobility       PT Treatment Interventions DME instruction;Gait training;Functional mobility training;Stair training;Therapeutic activities;Balance training;Therapeutic exercise;Patient/family education    PT Goals (Current goals can be found in the Care Plan section)  Acute Rehab PT Goals Patient Stated Goal: To have less nausea PT Goal Formulation: With patient Time For Goal Achievement: 03/05/21 Potential to Achieve Goals: Good    Frequency Min 3X/week   Barriers to discharge        Co-evaluation PT/OT/SLP Co-Evaluation/Treatment: Yes Reason for Co-Treatment: For patient/therapist safety PT goals addressed during session: Mobility/safety with mobility;Balance OT goals addressed during session: ADL's and self-care       AM-PAC PT "6 Clicks" Mobility  Outcome Measure Help needed turning from your back to your side while in a flat bed without using bedrails?: A Little Help needed moving from lying on your back to sitting on the side of a flat bed without using bedrails?: A Little Help needed moving to and from a bed to a chair (including a wheelchair)?: A Little Help needed standing up from a chair using your arms (e.g., wheelchair or bedside chair)?: A Little Help needed to walk in hospital room?: A Little Help needed climbing 3-5 steps with a railing? : A Little 6 Click Score: 18    End of Session   Activity Tolerance: Patient limited by pain Patient left: in chair;with call bell/phone within reach;with family/visitor present Nurse Communication: Mobility status PT Visit Diagnosis: Unsteadiness on feet (R26.81);Muscle weakness (generalized) (M62.81);Difficulty in walking,  not elsewhere classified (R26.2);Dizziness and giddiness (R42)    Time: [id number] PT Time Calculation (min) (ACUTE ONLY): 26 min   Charges:   PT Evaluation $PT Eval Moderate Complexity: 1 Mod PT Treatments $Therapeutic Activity: 8-22 mins        Samaad Hashem A. Gilford Rile PT, DPT Acute Rehabilitation Services Pager [phone] Office [phone]   Linna Hoff 02/19/2021, 1:55 PM

## 2021-02-19 NOTE — Progress Notes (Signed)
Subjective: The patient is mildly somnolent but easily arousable.  She is appropriately sore.  Objective: Vital signs in last 24 hours: Temp:  [97 F (36.1 C)-99.6 F (37.6 C)] 99.1 F (37.3 C) (09/03 0800) Pulse Rate:  [58-76] 76 (09/03 0800) Resp:  [15-21] 16 (09/03 0800) BP: (114-141)/(52-69) 132/64 (09/03 0800) SpO2:  [97 %-100 %] 97 % (09/03 0800) Arterial Line BP: (82-137)/(61-100) 112/75 (09/03 0800) Estimated body mass index is 29.12 kg/m as calculated from the following:   Height as of this encounter: '5\' 5"'$  (1.651 m).   Weight as of this encounter: 79.4 kg.   Intake/Output from previous day: 09/02 0701 - 09/03 0700 In: 4039 [P.O.:480; I.V.:2300; Blood:309; IV Piggyback:950] Out: [id number] [Urine:2695; Blood:1800] Intake/Output this shift: Total I/O In: -  Out: 125 [Urine:125]  Physical exam the patient is mildly somnolent but easily arousable.  Her speech and strength is normal.  Her left eye is swollen shut so I cannot assess her vision.  Lab Results: Recent Labs    02/18/21 1836 02/19/21 0704  WBC 20.0* 16.6*  HGB 11.4* 10.4*  HCT 33.5* 30.3*  PLT 242 214   BMET Recent Labs    02/18/21 1307 02/18/21 1836 02/19/21 0704  NA 142  --  137  K 4.5  --  4.3  CL  --   --  109  CO2  --   --  23  GLUCOSE  --   --  137*  BUN  --   --  8  CREATININE  --  0.87 0.86  CALCIUM  --   --  10.4*    Studies/Results: MR BRAIN W WO CONTRAST  Result Date: 02/19/2021 CLINICAL DATA:  Status post stage I resection of brain tumor EXAM: MRI HEAD WITHOUT AND WITH CONTRAST TECHNIQUE: Multiplanar, multiecho pulse sequences of the brain and surrounding structures were obtained without and with intravenous contrast. CONTRAST:  27m GADAVIST GADOBUTROL 1 MMOL/ML IV SOLN COMPARISON:  01/21/2021 FINDINGS: Brain: Status post resection of a large left paraclinoid meningioma. There is a small amount of extra-axial blood overlying the resection site. There is mild edema surrounding the  resection cavity without mass effect. Areas of hyperintense T2-weighted signal within the right temporal lobe and both inferior frontal lobes are unchanged. There is no acute infarct. The dominant left paraclinoid mass has been resected. Smaller foci of extra-axial contrast enhancement of the anterior cranial fossa are unchanged. The component medial to the left clinoid process measures 9 mm, unchanged (12:21). A medial right paraclinoid component measures 9 mm, also unchanged (12:21). A more anterior and lateral right-sided mass is also unchanged, measuring 8 mm (12:22). The plaque-like contrast enhancement and dural thickening of the right middle cranial fossa is unchanged. There is diffuse mild pachymeningeal contrast enhancement. Vascular: Major flow voids are preserved. Skull and upper cervical spine: Status post left frontoparietal craniotomy with overlying soft tissue swelling and subgaleal seroma. Extensive osseous remodeling of the right temporal bone and sphenoid. Sinuses/Orbits:No paranasal sinus fluid levels or advanced mucosal thickening. No mastoid or middle ear effusion. Unchanged exophthalmos on the right. IMPRESSION: 1. Status post resection of dominant component of left anterior cranial fossa extra-axial mass (likely meningioma) with unchanged plaque-like contrast enhancement and dural thickening of the right middle cranial fossa. 2. Unchanged small extra-axial masses in the medial paraclinoid region of the anterior cranial fossa 3. Unchanged osseous remodeling of the right temporal bone and sphenoid with right exophthalmos. Electronically Signed   By: KUlyses JarredM.D.   On:  02/19/2021 04:12    Assessment/Plan: Postop day 1: The patient is doing well.  We will DC her A-line and mobilize her.  LOS: 1 day     [name] 02/19/2021, 9:46 AM     Patient ID: Laurie Kelley, female   DOB: 1970/10/08, 50 y.o.   MRN: [id number]

## 2021-02-19 NOTE — Evaluation (Signed)
Occupational Therapy Evaluation Patient Details Name: Laurie Kelley MRN: [id number] DOB: [birth year] Today's Date: 02/19/2021    History of Present Illness This 50 y.o. 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   She underwent stage 1 resection of the Lt sided component.  PMH includes: reflux   Clinical Impression   Pt admitted with above. She demonstrates the below listed deficits and will benefit from continued OT to maximize safety and independence with BADLs.  Pt presents to OT with decreased activity tolerance, impaired balance, generalized weakness, impaired vision, and pain.  She currently requires mod - max A (overall) for ADLs and min A +2 for functional transfers.  She reports she lives with family, who can assist her as needed at discharge.  Will follow acutely      Follow Up Recommendations  Home health OT;Supervision/Assistance - 24 hour    Equipment Recommendations  Tub/shower seat    Recommendations for Other Services       Precautions / Restrictions Precautions Precautions: Fall      Mobility Bed Mobility               General bed mobility comments: Pt sitting EOB with PT    Transfers Overall transfer level: Needs assistance Equipment used: 1 person hand held assist;2 person hand held assist Transfers: Sit to/from [facility] Sit to Stand: Min assist;+2 safety/equipment;+2 physical assistance Stand pivot transfers: Min assist;+2 safety/equipment       General transfer comment: Pt became diaphoretic with move to EOB and with transition to standing.  BP was stable, sp02 >93%    Balance Overall balance assessment: Needs assistance Sitting-balance support: Feet supported Sitting balance-Leahy Scale: Fair     Standing balance support: Single extremity supported Standing balance-Leahy Scale: Poor Standing balance comment: requires UE support                            ADL either performed or assessed with clinical judgement   ADL Overall ADL's : Needs assistance/impaired Eating/Feeding: Maximal assistance Eating/Feeding Details (indicate cue type and reason): due to impaired vision due to swelling Grooming: Wash/dry hands;Wash/dry face;Oral care;Brushing hair;Maximal assistance;Sitting   Upper Body Bathing: Maximal assistance;Sitting   Lower Body Bathing: Maximal assistance;Sit to/from stand   Upper Body Dressing : Maximal assistance;Sitting   Lower Body Dressing: Total assistance;Sit to/from stand   Toilet Transfer: Minimal assistance;+2 for safety/equipment;Stand-pivot;BSC   Toileting- Clothing Manipulation and Hygiene: Maximal assistance;Sit to/from stand       Functional mobility during ADLs: Minimal assistance;+2 for safety/equipment General ADL Comments: Pt limited by pain, impaired vision, and c/o dizziness and nausea when moving     Vision   Additional Comments: Lt eye swollen shut.  With effort she can open the Rt eye, but it is painful     Perception Perception Perception Tested?: Yes   Praxis Praxis Praxis tested?: Within functional limits    Pertinent Vitals/Pain Pain Assessment: Faces Faces Pain Scale: Hurts even more Pain Location: headache Pain Descriptors / Indicators: Headache;Operative site guarding Pain Intervention(s): Monitored during session;Limited activity within patient's tolerance;Repositioned     Hand Dominance Right   Extremity/Trunk Assessment Upper Extremity Assessment Upper Extremity Assessment: Generalized weakness   Lower Extremity Assessment Lower Extremity Assessment: Defer to PT evaluation   Cervical / Trunk Assessment Cervical / Trunk Assessment: Other exceptions Cervical / Trunk Exceptions: pt with signficant facial swelling  Communication Communication Communication: Expressive difficulties (low volume)   Cognition Arousal/Alertness: Awake/alert Behavior During  Therapy: Flat affect Overall Cognitive Status: Within Functional Limits for tasks assessed                                 General Comments: grossly WFL for basic tasks   General Comments  family present during eval    Exercises     Shoulder Instructions      Home Living Family/patient expects to be discharged to:: Private residence                                        Prior Functioning/Environment Level of Independence: Independent        Comments: However, pt reports a progressive decline with her visual acuity        OT Problem List: Decreased activity tolerance;Impaired balance (sitting and/or standing);Impaired vision/perception;Decreased cognition;Decreased safety awareness;Decreased knowledge of use of DME or AE;Pain      OT Treatment/Interventions: Self-care/ADL training;DME and/or AE instruction;Therapeutic activities;Cognitive remediation/compensation;Visual/perceptual remediation/compensation;Patient/family education;Balance training    OT Goals(Current goals can be found in the care plan section) Acute Rehab OT Goals Patient Stated Goal: To have less nausea OT Goal Formulation: With patient/family Time For Goal Achievement: 03/05/21 Potential to Achieve Goals: Good  OT Frequency: Min 2X/week   Barriers to D/C:            Co-evaluation PT/OT/SLP Co-Evaluation/Treatment: Yes     OT goals addressed during session: ADL's and self-care      AM-PAC OT "6 Clicks" Daily Activity     Outcome Measure Help from another person eating meals?: A Lot Help from another person taking care of personal grooming?: A Lot Help from another person toileting, which includes using toliet, bedpan, or urinal?: A Lot Help from another person bathing (including washing, rinsing, drying)?: A Lot Help from another person to put on and taking off regular upper body clothing?: A Lot Help from another person to put on and taking off regular lower  body clothing?: A Lot 6 Click Score: 12   End of Session Nurse Communication: Mobility status  Activity Tolerance: Patient limited by pain;Other (comment) (nausea, dizziness, and diophoresis) Patient left: in chair;with call bell/phone within reach;with chair alarm set;with family/visitor present  OT Visit Diagnosis: Unsteadiness on feet (R26.81);Pain;Muscle weakness (generalized) (M62.81);Low vision, both eyes (H54.2) Pain - part of body:  (head)                Time: [id number] OT Time Calculation (min): 22 min Charges:  OT General Charges $OT Visit: 1 Visit OT Evaluation $OT Eval Moderate Complexity: 1 Mod  Laurie Kelley., OTR/L Acute Rehabilitation Services Pager [phone] Office [phone]   Lucille Passy M 02/19/2021, 1:39 PM

## 2021-02-20 NOTE — Progress Notes (Signed)
Physical Therapy Treatment Patient Details Name: Laurie Kelley MRN: [id number] DOB: [birth year] Today's Date: 02/20/2021    History of Present Illness This 50 y.o. 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   She underwent stage 1 resection of the Lt sided component.  PMH includes: reflux    PT Comments    Pt progressing well. Pt with no dizziness and didn't become diaphoretic this session however pt's HR increased to 147bpm and RR into 40s during ambulation requiring seated rest break. Anticipate pt to con't to progress well. Pt with good home set up and support. Educated pt on listening to her body and not just pushing her self as pt is very motivated to return to indep and home. Pt remains to have swollen L eye and impaired vision, limited ability to open L eye due to swelling. Acute PT to cont to follow.    Follow Up Recommendations  Home health PT;Supervision for mobility/OOB     Equipment Recommendations  None recommended by PT    Recommendations for Other Services       Precautions / Restrictions Precautions Precautions: Fall Restrictions Weight Bearing Restrictions: No    Mobility  Bed Mobility               General bed mobility comments: pt up in chair    Transfers Overall transfer level: Needs assistance Equipment used: None Transfers: Sit to/from Stand Sit to Stand: Min guard         General transfer comment: pt with good transfer technique and use of hands, min guard for safety  Ambulation/Gait Ambulation/Gait assistance: Min guard Gait Distance (Feet): 50 Feet (x1, 120x1) Assistive device: 1 person hand held assist (1/2 the time R HHA, second 1/2 no AD, min guard only via gait belt) Gait Pattern/deviations: Step-through pattern Gait velocity: wfl, verbal cues to slow down   General Gait Details: pt with increased HR to 147bpm from 88bpm at 50' with SpO2 dec to 80s however  suspect poor pleth. Had pt sit as her HR continued to inc, returned to 80s s/p 2 min. Told pt not to amb too fast and to take her time, pt more casual pace HR increased from 80s to 122bpm however RR increased to 42-44 with pt stating mild SOB/wheezing   Stairs             Wheelchair Mobility    Modified Rankin (Stroke Patients Only)       Balance Overall balance assessment: Needs assistance Sitting-balance support: Feet supported;No upper extremity supported Sitting balance-Leahy Scale: Good     Standing balance support: No upper extremity supported;During functional activity Standing balance-Leahy Scale: Fair Standing balance comment: mildly unsteady but no LOB                            Cognition Arousal/Alertness: Awake/alert Behavior During Therapy: WFL for tasks assessed/performed Overall Cognitive Status: Within Functional Limits for tasks assessed                                 General Comments: pt very motivated and eager to move, requiring v/c's to slow down as RR and HR increasing during amb      Exercises      General Comments General comments (skin integrity, edema, etc.): see ambulation section, HR inc to 147bpm during  ambulation RR increased to 44 during second bout of amb      Pertinent Vitals/Pain Pain Assessment: 0-10 Pain Score: 8  Pain Location: L frontal headache Pain Descriptors / Indicators: Headache Pain Intervention(s): Monitored during session    Home Living                      Prior Function            PT Goals (current goals can now be found in the care plan section) Progress towards PT goals: Progressing toward goals    Frequency    Min 3X/week      PT Plan Current plan remains appropriate    Co-evaluation              AM-PAC PT "6 Clicks" Mobility   Outcome Measure  Help needed turning from your back to your side while in a flat bed without using bedrails?: A Little Help  needed moving from lying on your back to sitting on the side of a flat bed without using bedrails?: A Little Help needed moving to and from a bed to a chair (including a wheelchair)?: A Little Help needed standing up from a chair using your arms (e.g., wheelchair or bedside chair)?: A Little Help needed to walk in hospital room?: A Little Help needed climbing 3-5 steps with a railing? : A Little 6 Click Score: 18    End of Session Equipment Utilized During Treatment: Gait belt Activity Tolerance: Patient tolerated treatment well Patient left: in chair;with call bell/phone within reach;with family/visitor present Nurse Communication: Mobility status PT Visit Diagnosis: Unsteadiness on feet (R26.81);Muscle weakness (generalized) (M62.81);Difficulty in walking, not elsewhere classified (R26.2);Dizziness and giddiness (R42)     Time: [id number] PT Time Calculation (min) (ACUTE ONLY): 25 min  Charges:  $Gait Training: 23-37 mins                     Kittie Plater, PT, DPT Acute Rehabilitation Services Pager #: [phone] Office #: [phone]    Berline Lopes 02/20/2021, 9:41 AM

## 2021-02-20 NOTE — Progress Notes (Signed)
   Providing Compassionate, Quality Care - Together  NEUROSURGERY PROGRESS NOTE   S: No issues overnight.  As expected periincisional swelling and periorbital swelling.  O: EXAM:  BP 124/67 (BP Location: Left Arm)   Pulse 69   Temp 99.5 F (37.5 C) (Oral)   Resp (!) 21   Ht '5\' 5"'$  (1.651 m)   Wt 79.4 kg   SpO2 94%   BMI 29.12 kg/m   Awake, alert, oriented x3 Right pupil round, appropriately reactive, left eyelid swollen shut therefore cannot assess Speech fluent, appropriate  CNs grossly intact  5/5 BUE/BLE  Incision clean dry and intact  ASSESSMENT:  50 y.o. female with  Left brain tumor, clinoidal   Status post left craniotomy for resection of tumor  PLAN: -Continue supportive care.  Doing well.  Plan discharge tomorrow    Thank you for allowing me to participate in this patient's care.  Please do not hesitate to call with questions or concerns.   Elwin Sleight, [facility] Cell: [phone]

## 2021-02-21 MED ORDER — ONDANSETRON HCL 4 MG PO TABS: 4.0000 mg | ORAL_TABLET | ORAL | 0 refills | Status: DC | PRN

## 2021-02-21 MED ORDER — HYDROCODONE-ACETAMINOPHEN 5-325 MG PO TABS: 1.0000 | ORAL_TABLET | ORAL | 0 refills | Status: DC | PRN

## 2021-02-21 NOTE — Progress Notes (Signed)
Occupational Therapy Treatment Patient Details Name: Laurie Kelley MRN: [id number] DOB: Sep 20, 1970 Today's Date: 02/21/2021    History of present illness This 50 y.o. 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   She underwent stage 1 resection of the Lt sided component.  PMH includes: reflux   OT comments  Pt significantly improved as compared to last session. Pt overall set up/S with ADL tasks. Educated pt on compensatory strategies for low vision. Pt plans to have another surgery in @ 6 weeks for tumor removal. Discussed need to follow up with her eye doctor once healed in order to further assess her vision and options for corrective lenses. If vision does not improve, she may benefit from outpt OT at the neuro outpt center to facilitate return to work. Pt can discuss with her MD as she progresses. Pt does not want HHOT.  Follow Up Recommendations  Supervision - Intermittent (follow up with eye doctor)    Equipment Recommendations  Tub/shower seat    Recommendations for Other Services      Precautions / Restrictions Precautions Precautions: Fall Precaution Comments: low vision Restrictions Weight Bearing Restrictions: No       Mobility Bed Mobility               General bed mobility comments: pt up in chair    Transfers Overall transfer level: Modified independent                    Balance     Sitting balance-Leahy Scale: Good       Standing balance-Leahy Scale: Fair                             ADL either performed or assessed with clinical judgement   ADL Overall ADL's : Needs assistance/impaired                                     Functional mobility during ADLs: Supervision/safety General ADL Comments: Overall set up and S with ADL tasks; significantly improved from yesterday. Educated on compensatory strateiges for low vision during ADL tasks  regarding contrast, light and reducing clutter     Vision       Perception     Praxis      Cognition Arousal/Alertness: Awake/alert Behavior During Therapy: WFL for tasks assessed/performed Overall Cognitive Status: Within Functional Limits for tasks assessed                                          Exercises     Shoulder Instructions       General Comments L eye swollen shut    Pertinent Vitals/ Pain       Pain Assessment: 0-10 Pain Score: 3  Pain Location: L frontal headache Pain Descriptors / Indicators: Pressure Pain Intervention(s): Limited activity within patient's tolerance  Home Living                                          Prior Functioning/Environment              Frequency  Min 2X/week        Progress Toward Goals  OT Goals(current goals can now be found in the care plan section)  Progress towards OT goals: Progressing toward goals  Acute Rehab OT Goals Patient Stated Goal: to go home Time For Goal Achievement: 03/05/21 Potential to Achieve Goals: Good ADL Goals Pt Will Perform Eating: with modified independence;sitting Pt Will Perform Grooming: with min guard assist;standing Pt Will Perform Upper Body Bathing: with set-up;with supervision;sitting Pt Will Perform Lower Body Bathing: with min guard assist;sit to/from stand Pt Will Perform Upper Body Dressing: with set-up;sitting Pt Will Perform Lower Body Dressing: with min guard assist;sit to/from stand Pt Will Transfer to Toilet: with min guard assist;ambulating;regular height toilet Pt Will Perform Toileting - Clothing Manipulation and hygiene: with min guard assist;sit to/from stand  Plan Discharge plan needs to be updated    Co-evaluation                 AM-PAC OT "6 Clicks" Daily Activity     Outcome Measure   Help from another person eating meals?: A Little Help from another person taking care of personal grooming?: A Little Help  from another person toileting, which includes using toliet, bedpan, or urinal?: A Little Help from another person bathing (including washing, rinsing, drying)?: A Little Help from another person to put on and taking off regular upper body clothing?: A Little Help from another person to put on and taking off regular lower body clothing?: A Little 6 Click Score: 18    End of Session    OT Visit Diagnosis: Unsteadiness on feet (R26.81);Pain;Muscle weakness (generalized) (M62.81);Low vision, both eyes (H54.2) Pain - Right/Left:  (head)   Activity Tolerance Patient tolerated treatment well   Patient Left in chair;with call bell/phone within reach   Nurse Communication Mobility status        Time: 0935-1000 OT Time Calculation (min): 25 min  Charges: OT General Charges $OT Visit: 1 Visit OT Treatments $Self Care/Home Management : 23-37 mins  Maurie Boettcher, OT/L   Acute OT Clinical Specialist [place] Pager [phone] Office [phone]    [facility] 02/21/2021, 10:06 AM

## 2021-02-21 NOTE — Progress Notes (Signed)
Discharge instructions given to and reviewed with patient and her daughter. Both patient and daughter verbalized understanding of all discharge instructions including follow up care, changes to home med list, new prescriptions and when to call the doctor. Patient left ICU alert with this RN by wheelchair. Discharged home with no distress noted with daughter driving.

## 2021-02-21 NOTE — Discharge Summary (Signed)
  Physician Discharge Summary  Patient ID: Laurie Kelley MRN: [id number] DOB/AGE: July 15, 1970 50 y.o.  Admit date: 02/18/2021 Discharge date: 02/21/2021  Admission Diagnoses:  Left clinoidal brain tumor  Discharge Diagnoses:  Same Active Problems:   Brain tumor [facility])   Discharged Condition: Stable  Hospital Course:  Laurie Kelley is a 50 y.o. female who presented for an elective left craniotomy for resection of tumor on 02/18/2021.  She tolerated the surgery well.  Postoperatively she had no acute complications.  She was monitored in the ICU postoperatively.  She was at her neurologic baseline, having normal bowel bladder function.  She was tolerating her pain with oral medication.  She was tolerating a normal diet.  Her incision is healing well.  She had appropriate periincisional pain and swelling.   Treatments: Surgery -left Oz craniotomy for resection of tumor  Discharge Exam: Blood pressure 103/67, pulse 87, temperature 99.2 F (37.3 C), temperature source Oral, resp. rate 18, height '5\' 5"'$  (1.651 m), weight 79.4 kg, SpO2 98 %. Awake, alert, oriented PERRL Speech fluent, appropriate CN grossly intact 5/5 BUE/BLE Wound c/d/I Left periorbital swelling, improving  Disposition: Discharge disposition: 01-Home or Self Care        Allergies as of 02/21/2021   No Known Allergies      Medication List     STOP taking these medications    dexamethasone 4 MG tablet Commonly known as: DECADRON   ibuprofen 200 MG tablet Commonly known as: ADVIL   ibuprofen 800 MG tablet Commonly known as: ADVIL   medroxyPROGESTERone 150 MG/ML injection Commonly known as: Depo-Provera   meloxicam 7.5 MG tablet Commonly known as: MOBIC   tiZANidine 4 MG tablet Commonly known as: ZANAFLEX       TAKE these medications    acetaminophen 325 MG tablet Commonly known as: TYLENOL Take 650 mg by mouth every 6 (six) hours as needed for moderate pain or headache.   atorvastatin  20 MG tablet Commonly known as: LIPITOR TAKE 1 TABLET (20 MG TOTAL) BY MOUTH DAILY. NEEDS OFFICE VISIT   AZO Bladder Control/Go-Less Caps Take 1 capsule by mouth at bedtime.   cholecalciferol 25 MCG (1000 UNIT) tablet Commonly known as: VITAMIN D3 Take 1,000 Units by mouth daily.   doxylamine (Sleep) 25 MG tablet Commonly known as: UNISOM Take 25 mg by mouth at bedtime as needed.   HYDROcodone-acetaminophen 5-325 MG tablet Commonly known as: NORCO/VICODIN Take 1 tablet by mouth every 4 (four) hours as needed for moderate pain.   magnesium oxide 400 MG tablet Commonly known as: MAG-OX Take 400 mg by mouth daily.   ondansetron 4 MG tablet Commonly known as: ZOFRAN Take 1 tablet (4 mg total) by mouth every 4 (four) hours as needed for nausea or vomiting.        Follow-up Information     Judith Part, MD Follow up.   Specialty: Neurosurgery Why: as scheduled Contact information: [address] [id number] [phone]                 Signed: Theodoro Doing Keah Lamba 02/21/2021, 8:29 AM

## 2021-02-22 ENCOUNTER — Telehealth: Payer: Self-pay | Admitting: Family Medicine

## 2021-02-22 ENCOUNTER — Telehealth: Payer: Self-pay

## 2021-02-22 ENCOUNTER — Encounter (HOSPITAL_COMMUNITY): Payer: Self-pay | Admitting: Neurological Surgery

## 2021-02-22 LAB — TYPE AND SCREEN
ABO/RH(D): B POS
Antibody Screen: NEGATIVE
Unit division: 0
Unit division: 0
Unit division: 0
Unit division: 0

## 2021-02-22 LAB — BPAM RBC
Blood Product Expiration Date: 202209252359
Blood Product Expiration Date: 202209252359
Blood Product Expiration Date: 202209252359
Blood Product Expiration Date: 202209252359
ISSUE DATE / TIME: 202209021032
ISSUE DATE / TIME: 202209042336
Unit Type and Rh: 7300
Unit Type and Rh: 7300
Unit Type and Rh: 7300
Unit Type and Rh: 7300

## 2021-02-22 NOTE — Telephone Encounter (Signed)
Called to check in on patient.  She was discharged from the hospital after brain surgery yesterday.  Next office visit with her neurosurgeon will be on the 16th of the month.  She does report some uncontrolled pain but she is only been taking her Norco every 6 hours.  She was unaware that she could take it every 4 hours.  She has not yet reached out to him with regards to this but I did reiterate that she may take it every 4 hours.  Would avoid extra Tylenol with this since it does contain it.  She voiced appreciation for the phone call.  I offered her an office visit on Friday if needed.  She will contact our office if this is needed.

## 2021-02-22 NOTE — Telephone Encounter (Signed)
Transition Care Management Follow-up Telephone Call Date of discharge and from where: 02/21/21 from [facility] How have you been since you were released from the hospital? [place], but hadifficulty getting medications from [facility] due to computer issues. However, was able to get before it closed. Also, had an episode yesterday involving jerk of head and sharp pain in head on yesterday. She states she called the "on call" doctor and discussed this episode with the on call doctor after this happened yesterday. No issues today.   Any questions or concerns? No  Items Reviewed: Did the pt receive and understand the discharge instructions provided? Yes  Medications obtained and verified? Yes  Other? No  Any new allergies since your discharge? No  Dietary orders reviewed? Yes Do you have support at home? No   Home Care and Equipment/Supplies: Were home health services ordered? no If so, what is the name of the agency?  Has the agency set up a time to come to the patient's home? not applicable Were any new equipment or medical supplies ordered?  No What is the name of the medical supply agency? Not applicable Were you able to get the supplies/equipment? not applicable Do you have any questions related to the use of the equipment or supplies? No  Functional Questionnaire: (I = Independent and D = Dependent) ADLs: I  Bathing/Dressing- I  Meal Prep- D  Eating- I  Maintaining continence- I  Transferring/Ambulation- I  Managing Meds- I  Follow up appointments reviewed:  PCP Hospital f/u appt confirmed? spoke with primary care provider today 02/22/21 @ 9:44 am. [facility] f/u appt confirmed? Yes  Scheduled to see [place] on 03/04/21 @ 1:15pm. Are transportation arrangements needed? No  If their condition worsens, is the pt aware to call PCP or go to the Emergency Dept.? Yes Was the patient provided with contact information for the PCP's office or ED? Yes Was to pt encouraged to call back  with questions or concerns? Yes

## 2021-02-23 LAB — SURGICAL PATHOLOGY

## 2021-03-08 ENCOUNTER — Other Ambulatory Visit: Payer: Self-pay

## 2021-03-08 ENCOUNTER — Encounter (HOSPITAL_COMMUNITY): Payer: Self-pay | Admitting: *Deleted

## 2021-03-08 ENCOUNTER — Ambulatory Visit (HOSPITAL_COMMUNITY)
Admission: RE | Admit: 2021-03-08 | Discharge: 2021-03-08 | Disposition: A | Discharge status: Home / Self Care | Payer: Commercial Managed Care - PPO | Source: Ambulatory Visit | Attending: Neurological Surgery | Admitting: Neurological Surgery

## 2021-03-08 ENCOUNTER — Other Ambulatory Visit (HOSPITAL_COMMUNITY): Payer: Self-pay | Admitting: Neurological Surgery

## 2021-03-08 DIAGNOSIS — M7989 Other specified soft tissue disorders: Secondary | ICD-10-CM

## 2021-04-11 ENCOUNTER — Telehealth: Payer: Self-pay | Admitting: Family Medicine

## 2021-04-11 NOTE — Telephone Encounter (Signed)
Pt aware recommendations

## 2021-04-11 NOTE — Telephone Encounter (Signed)
Quite possible that she will not have a cycle for several months after stopping the Depo since she has been on this for so long.  After that, it's really a toss up.  It may be light for a while or she may go back to normal cycles.

## 2021-04-11 NOTE — Telephone Encounter (Signed)
Patient states that she had a tumor removed and the day before she got her depo shot so she ended up with blood clots in her leg.  She has another surgery coming up and they advised her not to take her depo shot.  She is due for a shot in November. She has been on depo for 25 years and would like to know how to expect her cycle since stopping depo? Does not need alternate birth control.

## 2021-04-19 ENCOUNTER — Other Ambulatory Visit: Payer: Self-pay | Admitting: Neurological Surgery

## 2021-04-22 ENCOUNTER — Other Ambulatory Visit (HOSPITAL_COMMUNITY): Payer: Self-pay | Admitting: Neurological Surgery

## 2021-04-22 ENCOUNTER — Other Ambulatory Visit: Payer: Self-pay | Admitting: Neurological Surgery

## 2021-04-22 ENCOUNTER — Other Ambulatory Visit: Payer: Self-pay

## 2021-04-22 ENCOUNTER — Ambulatory Visit (HOSPITAL_COMMUNITY)
Admission: RE | Admit: 2021-04-22 | Discharge: 2021-04-22 | Disposition: A | Discharge status: Home / Self Care | Payer: Commercial Managed Care - PPO | Source: Ambulatory Visit | Attending: Neurological Surgery | Admitting: Neurological Surgery

## 2021-04-22 DIAGNOSIS — M7989 Other specified soft tissue disorders: Secondary | ICD-10-CM

## 2021-04-22 DIAGNOSIS — D496 Neoplasm of unspecified behavior of brain: Secondary | ICD-10-CM

## 2021-05-11 NOTE — Pre-Procedure Instructions (Signed)
Surgical Instructions    Your procedure is scheduled on Wednesday, May 18, 2021 at 12:30 PM.  Report to [facility] Main Entrance "A" at 10:30 A.M., then check in with the Admitting office.  Call this number if you have problems the morning of surgery:  [phone]   If you have any questions prior to your surgery date call [phone]: Open Monday-Friday 8am-4pm    Remember:  Do not eat after midnight the night before your surgery  You may drink clear liquids until 9:30 AM the morning of your surgery.   Clear liquids allowed are: Water, Non-Citrus Juices (without pulp), Carbonated Beverages, Clear Tea, Black Coffee Only, and Gatorade    Take these medicines the morning of surgery with A SIP OF WATER:  atorvastatin (LIPITOR) acetaminophen (TYLENOL) - if needed  As of today, STOP taking any Aspirin (unless otherwise instructed by your surgeon) Aleve, Naproxen, Ibuprofen, Motrin, Advil, Goody's, BC's, all herbal medications, fish oil, and all vitamins.                     Do NOT Smoke (Tobacco/Vaping) or drink Alcohol 24 hours prior to your procedure.  If you use a CPAP at night, you may bring all equipment for your overnight stay.   Contacts, glasses, piercing's, hearing aid's, dentures or partials may not be worn into surgery, please bring cases for these belongings.    For patients admitted to the hospital, discharge time will be determined by your treatment team.   Patients discharged the day of surgery will not be allowed to drive home, and someone needs to stay with them for 24 hours.  NO VISITORS WILL BE ALLOWED IN PRE-OP WHERE PATIENTS GET READY FOR SURGERY.  ONLY 1 SUPPORT PERSON MAY BE PRESENT IN THE WAITING ROOM WHILE YOU ARE IN SURGERY.  IF YOU ARE TO BE ADMITTED, ONCE YOU ARE IN YOUR ROOM YOU WILL BE ALLOWED TWO (2) VISITORS.  Minor children may have two parents present. Special consideration for safety and communication needs will be reviewed on a case by case  basis.   Special instructions:   [facility]- Preparing For Surgery  Before surgery, you can play an important role. Because skin is not sterile, your skin needs to be as free of germs as possible. You can reduce the number of germs on your skin by washing with CHG (chlorahexidine gluconate) Soap before surgery.  CHG is an antiseptic cleaner which kills germs and bonds with the skin to continue killing germs even after washing.    Oral Hygiene is also important to reduce your risk of infection.  Remember - BRUSH YOUR TEETH THE MORNING OF SURGERY WITH YOUR REGULAR TOOTHPASTE  Please do not use if you have an allergy to CHG or antibacterial soaps. If your skin becomes reddened/irritated stop using the CHG.  Do not shave (including legs and underarms) for at least 48 hours prior to first CHG shower. It is OK to shave your face.  Please follow these instructions carefully.   Shower the NIGHT BEFORE SURGERY and the MORNING OF SURGERY  If you chose to wash your hair, wash your hair first as usual with your normal shampoo.  After you shampoo, rinse your hair and body thoroughly to remove the shampoo.  Use CHG Soap as you would any other liquid soap. You can apply CHG directly to the skin and wash gently with a scrungie or a clean washcloth.   Apply the CHG Soap to your body ONLY  FROM THE NECK DOWN.  Do not use on open wounds or open sores. Avoid contact with your eyes, ears, mouth and genitals (private parts). Wash Face and genitals (private parts)  with your normal soap.   Wash thoroughly, paying special attention to the area where your surgery will be performed.  Thoroughly rinse your body with warm water from the neck down.  DO NOT shower/wash with your normal soap after using and rinsing off the CHG Soap.  Pat yourself dry with a CLEAN TOWEL.  Wear CLEAN PAJAMAS to bed the night before surgery  Place CLEAN SHEETS on your bed the night before your surgery  DO NOT SLEEP WITH  PETS.   Day of Surgery: Shower with CHG soap. Do not wear jewelry, make up, nail polish, gel polish, artificial nails, or any other type of covering on natural nails including finger and toenails. If patients have artificial nails, gel coating, etc. that need to be removed by a nail salon please have this removed prior to surgery. Surgery may need to be canceled/delayed if the surgeon/ anesthesia feels like the patient is unable to be adequately monitored. Do not wear lotions, powders, perfumes, or deodorant. Do not shave 48 hours prior to surgery. Do not bring valuables to the hospital. [facility] is not responsible for any belongings or valuables. Wear Clean/Comfortable clothing the morning of surgery Remember to brush your teeth WITH YOUR REGULAR TOOTHPASTE.   Please read over the following fact sheets that you were given.   3 days prior to your procedure or After your COVID test   You are not required to quarantine however you are required to wear a well-fitting mask when you are out and around people not in your household. If your mask becomes wet or soiled, replace with a new one.   Wash your hands often with soap and water for 20 seconds or clean your hands with an alcohol-based hand sanitizer that contains at least 60% alcohol.   Do not share personal items.   Notify your provider:  o if you are in close contact with someone who has COVID  o or if you develop a fever of 100.4 or greater, sneezing, cough, sore throat, shortness of breath or body aches.

## 2021-05-13 ENCOUNTER — Ambulatory Visit (HOSPITAL_COMMUNITY)
Admission: RE | Admit: 2021-05-13 | Discharge: 2021-05-13 | Disposition: A | Discharge status: Home / Self Care | Payer: Commercial Managed Care - PPO | Source: Ambulatory Visit | Attending: Neurological Surgery | Admitting: Neurological Surgery

## 2021-05-13 ENCOUNTER — Other Ambulatory Visit: Payer: Self-pay

## 2021-05-13 DIAGNOSIS — D496 Neoplasm of unspecified behavior of brain: Secondary | ICD-10-CM | POA: Insufficient documentation

## 2021-05-13 MED ORDER — GADOBUTROL 1 MMOL/ML IV SOLN
7.0000 mL | Freq: Once | INTRAVENOUS | Status: AC | PRN
  Administered 2021-05-13: 7 mL via INTRAVENOUS

## 2021-05-16 ENCOUNTER — Other Ambulatory Visit: Payer: Self-pay

## 2021-05-16 ENCOUNTER — Encounter (HOSPITAL_COMMUNITY)
Admission: RE | Admit: 2021-05-16 | Discharge: 2021-05-16 | Disposition: A | Discharge status: Home / Self Care | Payer: Commercial Managed Care - PPO | Source: Ambulatory Visit | Attending: Neurological Surgery | Admitting: Neurological Surgery

## 2021-05-16 ENCOUNTER — Encounter (HOSPITAL_COMMUNITY): Payer: Self-pay

## 2021-05-16 VITALS — BP 133/82 | HR 98 | Temp 97.8°F | Resp 17 | Ht 65.0 in | Wt 177.8 lb

## 2021-05-16 DIAGNOSIS — Z01812 Encounter for preprocedural laboratory examination: Secondary | ICD-10-CM | POA: Insufficient documentation

## 2021-05-16 DIAGNOSIS — Z20822 Contact with and (suspected) exposure to covid-19: Secondary | ICD-10-CM | POA: Insufficient documentation

## 2021-05-16 DIAGNOSIS — Z01818 Encounter for other preprocedural examination: Secondary | ICD-10-CM

## 2021-05-16 HISTORY — DX: Malignant (primary) neoplasm, unspecified: C80.1

## 2021-05-16 LAB — CBC
HCT: 43.2 % (ref 36.0–46.0)
Hemoglobin: 14.1 g/dL (ref 12.0–15.0)
MCH: 29 pg (ref 26.0–34.0)
MCHC: 32.6 g/dL (ref 30.0–36.0)
MCV: 88.7 fL (ref 80.0–100.0)
Platelets: 305 10*3/uL (ref 150–400)
RBC: 4.87 MIL/uL (ref 3.87–5.11)
RDW: 12.2 % (ref 11.5–15.5)
WBC: 4.7 10*3/uL (ref 4.0–10.5)
nRBC: 0 % (ref 0.0–0.2)

## 2021-05-16 LAB — SARS CORONAVIRUS 2 (TAT 6-24 HRS): SARS Coronavirus 2: NEGATIVE

## 2021-05-16 NOTE — Progress Notes (Signed)
PCP - Ronnie Doss, DO Cardiologist - denies  PPM/ICD - denies Device Orders - n/a Rep Notified - n/a  Chest x-ray - n/a EKG - 02/04/2021 Stress Test - > 10 years ago (normal per note CE 08/01/2017) ECHO - as a teenager - normal per note CE Cardiac Cath - denies  Sleep Study - denies CPAP - n/a  Fasting Blood Sugar - n/a  Blood Thinner Instructions: n/a  Aspirin Instructions: Patient was instructed: As of today, STOP taking any Aspirin (unless otherwise instructed by your surgeon) Aleve, Naproxen, Ibuprofen, Motrin, Advil, Goody's, BC's, all herbal medications, fish oil, and all vitamins.           ERAS Protcol - yes PRE-SURGERY Ensure or G2- n/a  COVID TEST- yes, done in PAT on 05/16/2021   Anesthesia review: no  Patient denies shortness of breath, fever, cough and chest pain at PAT appointment   All instructions explained to the patient, with a verbal understanding of the material. Patient agrees to go over the instructions while at home for a better understanding. Patient also instructed to self quarantine after being tested for COVID-19. The opportunity to ask questions was provided.

## 2021-05-18 ENCOUNTER — Encounter (HOSPITAL_COMMUNITY): Payer: Self-pay | Admitting: Neurological Surgery

## 2021-05-18 ENCOUNTER — Inpatient Hospital Stay (HOSPITAL_COMMUNITY)
Admission: RE | Disposition: A | Discharge status: Home / Self Care | Payer: Self-pay | Source: Home / Self Care | Attending: Neurological Surgery

## 2021-05-18 ENCOUNTER — Other Ambulatory Visit: Payer: Self-pay

## 2021-05-18 ENCOUNTER — Inpatient Hospital Stay (HOSPITAL_COMMUNITY): Payer: Commercial Managed Care - PPO | Admitting: Anesthesiology

## 2021-05-18 ENCOUNTER — Inpatient Hospital Stay (HOSPITAL_COMMUNITY)
Admission: RE | Admit: 2021-05-18 | Discharge: 2021-05-20 | DRG: 027 | Disposition: A | Discharge status: Home / Self Care | Payer: Commercial Managed Care - PPO | Attending: Neurological Surgery | Admitting: Neurological Surgery

## 2021-05-18 DIAGNOSIS — Z20822 Contact with and (suspected) exposure to covid-19: Secondary | ICD-10-CM | POA: Diagnosis present

## 2021-05-18 DIAGNOSIS — Z801 Family history of malignant neoplasm of trachea, bronchus and lung: Secondary | ICD-10-CM | POA: Diagnosis not present

## 2021-05-18 DIAGNOSIS — Z8249 Family history of ischemic heart disease and other diseases of the circulatory system: Secondary | ICD-10-CM

## 2021-05-18 DIAGNOSIS — Z83438 Family history of other disorder of lipoprotein metabolism and other lipidemia: Secondary | ICD-10-CM

## 2021-05-18 DIAGNOSIS — Z9889 Other specified postprocedural states: Secondary | ICD-10-CM

## 2021-05-18 DIAGNOSIS — D320 Benign neoplasm of cerebral meninges: Secondary | ICD-10-CM | POA: Diagnosis present

## 2021-05-18 DIAGNOSIS — Z808 Family history of malignant neoplasm of other organs or systems: Secondary | ICD-10-CM

## 2021-05-18 DIAGNOSIS — D496 Neoplasm of unspecified behavior of brain: Secondary | ICD-10-CM | POA: Diagnosis present

## 2021-05-18 HISTORY — PX: APPLICATION OF CRANIAL NAVIGATION: SHX6578

## 2021-05-18 HISTORY — PX: CRANIOTOMY: SHX93

## 2021-05-18 LAB — CBC
HCT: 34.1 % — ABNORMAL LOW (ref 36.0–46.0)
Hemoglobin: 11.4 g/dL — ABNORMAL LOW (ref 12.0–15.0)
MCH: 29.5 pg (ref 26.0–34.0)
MCHC: 33.4 g/dL (ref 30.0–36.0)
MCV: 88.3 fL (ref 80.0–100.0)
Platelets: 258 10*3/uL (ref 150–400)
RBC: 3.86 MIL/uL — ABNORMAL LOW (ref 3.87–5.11)
RDW: 12.4 % (ref 11.5–15.5)
WBC: 13.1 10*3/uL — ABNORMAL HIGH (ref 4.0–10.5)
nRBC: 0 % (ref 0.0–0.2)

## 2021-05-18 LAB — POCT I-STAT 7, (LYTES, BLD GAS, ICA,H+H)
Acid-base deficit: 6 mmol/L — ABNORMAL HIGH (ref 0.0–2.0)
Acid-base deficit: 7 mmol/L — ABNORMAL HIGH (ref 0.0–2.0)
Bicarbonate: 18.6 mmol/L — ABNORMAL LOW (ref 20.0–28.0)
Bicarbonate: 19.5 mmol/L — ABNORMAL LOW (ref 20.0–28.0)
Calcium, Ion: 1.3 mmol/L (ref 1.15–1.40)
Calcium, Ion: 1.3 mmol/L (ref 1.15–1.40)
HCT: 26 % — ABNORMAL LOW (ref 36.0–46.0)
HCT: 30 % — ABNORMAL LOW (ref 36.0–46.0)
Hemoglobin: 10.2 g/dL — ABNORMAL LOW (ref 12.0–15.0)
Hemoglobin: 8.8 g/dL — ABNORMAL LOW (ref 12.0–15.0)
O2 Saturation: 100 %
O2 Saturation: 100 %
Potassium: 4.3 mmol/L (ref 3.5–5.1)
Potassium: 4.6 mmol/L (ref 3.5–5.1)
Sodium: 142 mmol/L (ref 135–145)
Sodium: 143 mmol/L (ref 135–145)
TCO2: 20 mmol/L — ABNORMAL LOW (ref 22–32)
TCO2: 21 mmol/L — ABNORMAL LOW (ref 22–32)
pCO2 arterial: 35.3 mmHg (ref 32.0–48.0)
pCO2 arterial: 36.3 mmHg (ref 32.0–48.0)
pH, Arterial: 7.33 — ABNORMAL LOW (ref 7.350–7.450)
pH, Arterial: 7.337 — ABNORMAL LOW (ref 7.350–7.450)
pO2, Arterial: 289 mmHg — ABNORMAL HIGH (ref 83.0–108.0)
pO2, Arterial: 297 mmHg — ABNORMAL HIGH (ref 83.0–108.0)

## 2021-05-18 LAB — CREATININE, SERUM
Creatinine, Ser: 0.92 mg/dL (ref 0.44–1.00)
GFR, Estimated: >60 mL/min (ref >60)

## 2021-05-18 LAB — POCT PREGNANCY, URINE: Preg Test, Ur: NEGATIVE

## 2021-05-18 LAB — POCT I-STAT, CHEM 8
BUN: 6 mg/dL (ref 6–20)
Calcium, Ion: 1.17 mmol/L (ref 1.15–1.40)
Chloride: 119 mmol/L — ABNORMAL HIGH (ref 98–111)
Creatinine, Ser: 0.4 mg/dL — ABNORMAL LOW (ref 0.44–1.00)
Glucose, Bld: 101 mg/dL — ABNORMAL HIGH (ref 70–99)
HCT: 22 % — ABNORMAL LOW (ref 36.0–46.0)
Hemoglobin: 7.5 g/dL — ABNORMAL LOW (ref 12.0–15.0)
Potassium: 3.1 mmol/L — ABNORMAL LOW (ref 3.5–5.1)
Sodium: 145 mmol/L (ref 135–145)
TCO2: 16 mmol/L — ABNORMAL LOW (ref 22–32)

## 2021-05-18 LAB — PREPARE RBC (CROSSMATCH)

## 2021-05-18 LAB — MRSA NEXT GEN BY PCR, NASAL: MRSA by PCR Next Gen: NOT DETECTED

## 2021-05-18 SURGERY — CRANIOTOMY TUMOR EXCISION
Anesthesia: General | Site: Head | Laterality: Right

## 2021-05-18 MED ORDER — PROPOFOL 10 MG/ML IV BOLUS
INTRAVENOUS | Status: DC | PRN
  Administered 2021-05-18: 200 mg via INTRAVENOUS

## 2021-05-18 MED ORDER — MIDAZOLAM HCL 2 MG/2ML IJ SOLN
INTRAMUSCULAR | Status: DC | PRN
  Administered 2021-05-18: 2 mg via INTRAVENOUS

## 2021-05-18 MED ORDER — SODIUM CHLORIDE 0.9 % IV SOLN
0.0500 ug/kg/min | Freq: Once | INTRAVENOUS | Status: DC
  Filled 2021-05-18: qty 5000

## 2021-05-18 MED ORDER — MAGNESIUM OXIDE -MG SUPPLEMENT 400 (240 MG) MG PO TABS
400.0000 mg | ORAL_TABLET | Freq: Every day | ORAL | Status: DC
  Administered 2021-05-19: 400 mg via ORAL
  Filled 2021-05-18 (×5): qty 1

## 2021-05-18 MED ORDER — THROMBIN 20000 UNITS EX SOLR
CUTANEOUS | Status: AC
  Filled 2021-05-18: qty 20000

## 2021-05-18 MED ORDER — EPHEDRINE 5 MG/ML INJ
INTRAVENOUS | Status: AC
  Filled 2021-05-18: qty 5

## 2021-05-18 MED ORDER — THROMBIN 20000 UNITS EX SOLR: CUTANEOUS | Status: DC | PRN

## 2021-05-18 MED ORDER — FENTANYL CITRATE (PF) 100 MCG/2ML IJ SOLN
25.0000 ug | INTRAMUSCULAR | Status: DC | PRN
  Administered 2021-05-18: 50 ug via INTRAVENOUS

## 2021-05-18 MED ORDER — CHLORHEXIDINE GLUCONATE 0.12 % MT SOLN
15.0000 mL | Freq: Once | OROMUCOSAL | Status: AC
  Administered 2021-05-18: 15 mL via OROMUCOSAL
  Filled 2021-05-18: qty 15

## 2021-05-18 MED ORDER — ACETAMINOPHEN 325 MG PO TABS
650.0000 mg | ORAL_TABLET | ORAL | Status: DC | PRN
  Administered 2021-05-19 (×3): 650 mg via ORAL
  Filled 2021-05-18 (×3): qty 2

## 2021-05-18 MED ORDER — LACTATED RINGERS IV SOLN: INTRAVENOUS | Status: DC

## 2021-05-18 MED ORDER — HEPARIN SODIUM (PORCINE) 5000 UNIT/ML IJ SOLN
5000.0000 [IU] | Freq: Three times a day (TID) | INTRAMUSCULAR | Status: DC
  Administered 2021-05-20: 5000 [IU] via SUBCUTANEOUS
  Filled 2021-05-18: qty 1

## 2021-05-18 MED ORDER — DOXYLAMINE SUCCINATE (SLEEP) 25 MG PO TABS
25.0000 mg | ORAL_TABLET | Freq: Every evening | ORAL | Status: DC | PRN
  Filled 2021-05-18: qty 1

## 2021-05-18 MED ORDER — AMISULPRIDE (ANTIEMETIC) 5 MG/2ML IV SOLN
INTRAVENOUS | Status: AC
  Filled 2021-05-18: qty 2

## 2021-05-18 MED ORDER — HYDROCODONE-ACETAMINOPHEN 5-325 MG PO TABS
1.0000 | ORAL_TABLET | ORAL | Status: DC | PRN
  Administered 2021-05-19: 1 via ORAL
  Filled 2021-05-18: qty 1

## 2021-05-18 MED ORDER — ALBUMIN HUMAN 5 % IV SOLN: INTRAVENOUS | Status: DC | PRN

## 2021-05-18 MED ORDER — SODIUM CHLORIDE 0.9 % IV SOLN: INTRAVENOUS | Status: DC

## 2021-05-18 MED ORDER — ONDANSETRON HCL 4 MG/2ML IJ SOLN: 4.0000 mg | Freq: Four times a day (QID) | INTRAMUSCULAR | Status: DC | PRN

## 2021-05-18 MED ORDER — DEXAMETHASONE SODIUM PHOSPHATE 10 MG/ML IJ SOLN
INTRAMUSCULAR | Status: DC | PRN
  Administered 2021-05-18: 10 mg via INTRAVENOUS

## 2021-05-18 MED ORDER — FENTANYL CITRATE (PF) 250 MCG/5ML IJ SOLN
INTRAMUSCULAR | Status: AC
  Filled 2021-05-18: qty 5

## 2021-05-18 MED ORDER — ONDANSETRON HCL 4 MG/2ML IJ SOLN
INTRAMUSCULAR | Status: AC
  Filled 2021-05-18: qty 2

## 2021-05-18 MED ORDER — LIDOCAINE-EPINEPHRINE 1 %-1:100000 IJ SOLN
INTRAMUSCULAR | Status: AC
  Filled 2021-05-18: qty 1

## 2021-05-18 MED ORDER — ONDANSETRON HCL 4 MG/2ML IJ SOLN
INTRAMUSCULAR | Status: DC | PRN
  Administered 2021-05-18: 4 mg via INTRAVENOUS

## 2021-05-18 MED ORDER — PROCHLORPERAZINE 25 MG RE SUPP
25.0000 mg | Freq: Two times a day (BID) | RECTAL | Status: DC | PRN
  Filled 2021-05-18: qty 1

## 2021-05-18 MED ORDER — SUGAMMADEX SODIUM 200 MG/2ML IV SOLN
INTRAVENOUS | Status: DC | PRN
  Administered 2021-05-18: 300 mg via INTRAVENOUS

## 2021-05-18 MED ORDER — BACITRACIN ZINC 500 UNIT/GM EX OINT
TOPICAL_OINTMENT | CUTANEOUS | Status: DC | PRN
  Administered 2021-05-18 (×2): 1 via TOPICAL

## 2021-05-18 MED ORDER — LABETALOL HCL 5 MG/ML IV SOLN: 10.0000 mg | INTRAVENOUS | Status: DC | PRN

## 2021-05-18 MED ORDER — THROMBIN 5000 UNITS EX SOLR
CUTANEOUS | Status: AC
  Filled 2021-05-18: qty 5000

## 2021-05-18 MED ORDER — PHENYLEPHRINE 40 MCG/ML (10ML) SYRINGE FOR IV PUSH (FOR BLOOD PRESSURE SUPPORT)
PREFILLED_SYRINGE | INTRAVENOUS | Status: AC
  Filled 2021-05-18: qty 10

## 2021-05-18 MED ORDER — 0.9 % SODIUM CHLORIDE (POUR BTL) OPTIME
TOPICAL | Status: DC | PRN
  Administered 2021-05-18 (×3): 1000 mL

## 2021-05-18 MED ORDER — DEXAMETHASONE SODIUM PHOSPHATE 10 MG/ML IJ SOLN
INTRAMUSCULAR | Status: AC
  Filled 2021-05-18: qty 1

## 2021-05-18 MED ORDER — CHLORHEXIDINE GLUCONATE CLOTH 2 % EX PADS
6.0000 | MEDICATED_PAD | Freq: Every day | CUTANEOUS | Status: DC
  Administered 2021-05-18 – 2021-05-19 (×2): 6 via TOPICAL

## 2021-05-18 MED ORDER — CEFAZOLIN SODIUM-DEXTROSE 2-4 GM/100ML-% IV SOLN
2.0000 g | INTRAVENOUS | Status: AC
  Administered 2021-05-18: 2 g via INTRAVENOUS
  Filled 2021-05-18: qty 100

## 2021-05-18 MED ORDER — ONDANSETRON HCL 4 MG/2ML IJ SOLN
4.0000 mg | INTRAMUSCULAR | Status: DC | PRN
  Administered 2021-05-18 – 2021-05-19 (×2): 4 mg via INTRAVENOUS
  Filled 2021-05-18 (×2): qty 2

## 2021-05-18 MED ORDER — LIDOCAINE 2% (20 MG/ML) 5 ML SYRINGE
INTRAMUSCULAR | Status: DC | PRN
  Administered 2021-05-18: 60 mg via INTRAVENOUS

## 2021-05-18 MED ORDER — CEFAZOLIN SODIUM-DEXTROSE 2-4 GM/100ML-% IV SOLN
2.0000 g | Freq: Three times a day (TID) | INTRAVENOUS | Status: AC
  Administered 2021-05-18 – 2021-05-19 (×2): 2 g via INTRAVENOUS
  Filled 2021-05-18 (×2): qty 100

## 2021-05-18 MED ORDER — SODIUM CHLORIDE 0.9 % IV SOLN
INTRAVENOUS | Status: AC | PRN
  Administered 2021-05-18: 1000 mL

## 2021-05-18 MED ORDER — CHLORHEXIDINE GLUCONATE CLOTH 2 % EX PADS: 6.0000 | MEDICATED_PAD | Freq: Once | CUTANEOUS | Status: AC

## 2021-05-18 MED ORDER — THROMBIN 5000 UNITS EX SOLR: OROMUCOSAL | Status: DC | PRN

## 2021-05-18 MED ORDER — FENTANYL CITRATE (PF) 100 MCG/2ML IJ SOLN
INTRAMUSCULAR | Status: AC
  Filled 2021-05-18: qty 2

## 2021-05-18 MED ORDER — HYDROMORPHONE HCL 1 MG/ML IJ SOLN
0.5000 mg | INTRAMUSCULAR | Status: DC | PRN
  Administered 2021-05-18 – 2021-05-19 (×2): 0.5 mg via INTRAVENOUS
  Filled 2021-05-18 (×2): qty 1

## 2021-05-18 MED ORDER — SODIUM CHLORIDE 0.9 % IV SOLN
0.0125 ug/kg/min | INTRAVENOUS | Status: AC
  Administered 2021-05-18: .1 ug/kg/min via INTRAVENOUS
  Filled 2021-05-18: qty 2000

## 2021-05-18 MED ORDER — HYDROMORPHONE HCL 1 MG/ML IJ SOLN
INTRAMUSCULAR | Status: AC
  Filled 2021-05-18: qty 0.5

## 2021-05-18 MED ORDER — ROCURONIUM BROMIDE 10 MG/ML (PF) SYRINGE
PREFILLED_SYRINGE | INTRAVENOUS | Status: AC
  Filled 2021-05-18: qty 10

## 2021-05-18 MED ORDER — HEMOSTATIC AGENTS (NO CHARGE) OPTIME
TOPICAL | Status: DC | PRN
  Administered 2021-05-18: 1 via TOPICAL

## 2021-05-18 MED ORDER — SODIUM CHLORIDE 0.9 % IV SOLN
25.0000 mg | Freq: Two times a day (BID) | INTRAVENOUS | Status: DC | PRN
  Administered 2021-05-19: 25 mg via INTRAVENOUS
  Filled 2021-05-18 (×3): qty 1

## 2021-05-18 MED ORDER — HYDROMORPHONE HCL 1 MG/ML IJ SOLN
INTRAMUSCULAR | Status: DC | PRN
  Administered 2021-05-18: .5 mg via INTRAVENOUS

## 2021-05-18 MED ORDER — ROCURONIUM BROMIDE 10 MG/ML (PF) SYRINGE
PREFILLED_SYRINGE | INTRAVENOUS | Status: AC
  Filled 2021-05-18: qty 20

## 2021-05-18 MED ORDER — LIDOCAINE 2% (20 MG/ML) 5 ML SYRINGE
INTRAMUSCULAR | Status: AC
  Filled 2021-05-18: qty 5

## 2021-05-18 MED ORDER — OXYCODONE HCL 5 MG PO TABS: 5.0000 mg | ORAL_TABLET | Freq: Once | ORAL | Status: DC | PRN

## 2021-05-18 MED ORDER — PROMETHAZINE HCL 25 MG PO TABS
12.5000 mg | ORAL_TABLET | ORAL | Status: DC | PRN
  Filled 2021-05-18: qty 2
  Filled 2021-05-18: qty 1

## 2021-05-18 MED ORDER — ACETAMINOPHEN 650 MG RE SUPP: 650.0000 mg | RECTAL | Status: DC | PRN

## 2021-05-18 MED ORDER — ONDANSETRON HCL 4 MG PO TABS: 4.0000 mg | ORAL_TABLET | ORAL | Status: DC | PRN

## 2021-05-18 MED ORDER — ORAL CARE MOUTH RINSE: 15.0000 mL | Freq: Once | OROMUCOSAL | Status: AC

## 2021-05-18 MED ORDER — ATORVASTATIN CALCIUM 10 MG PO TABS
20.0000 mg | ORAL_TABLET | Freq: Every day | ORAL | Status: DC
  Administered 2021-05-19 – 2021-05-20 (×2): 20 mg via ORAL
  Filled 2021-05-18 (×2): qty 2

## 2021-05-18 MED ORDER — OXYCODONE HCL 5 MG/5ML PO SOLN: 5.0000 mg | Freq: Once | ORAL | Status: DC | PRN

## 2021-05-18 MED ORDER — SODIUM CHLORIDE 0.9 % IV SOLN: 10.0000 mL/h | Freq: Once | INTRAVENOUS | Status: DC

## 2021-05-18 MED ORDER — MIDAZOLAM HCL 2 MG/2ML IJ SOLN
INTRAMUSCULAR | Status: AC
  Filled 2021-05-18: qty 2

## 2021-05-18 MED ORDER — BACITRACIN ZINC 500 UNIT/GM EX OINT
TOPICAL_OINTMENT | CUTANEOUS | Status: AC
  Filled 2021-05-18: qty 28.35

## 2021-05-18 MED ORDER — ROCURONIUM BROMIDE 10 MG/ML (PF) SYRINGE
PREFILLED_SYRINGE | INTRAVENOUS | Status: DC | PRN
  Administered 2021-05-18: 60 mg via INTRAVENOUS
  Administered 2021-05-18: 20 mg via INTRAVENOUS
  Administered 2021-05-18: 10 mg via INTRAVENOUS
  Administered 2021-05-18 (×3): 20 mg via INTRAVENOUS

## 2021-05-18 MED ORDER — PHENYLEPHRINE HCL-NACL 20-0.9 MG/250ML-% IV SOLN
INTRAVENOUS | Status: DC | PRN
  Administered 2021-05-18: 20 ug/min via INTRAVENOUS

## 2021-05-18 MED ORDER — AMISULPRIDE (ANTIEMETIC) 5 MG/2ML IV SOLN
10.0000 mg | Freq: Once | INTRAVENOUS | Status: AC
  Administered 2021-05-18: 5 mg via INTRAVENOUS

## 2021-05-18 MED ORDER — FENTANYL CITRATE (PF) 100 MCG/2ML IJ SOLN
INTRAMUSCULAR | Status: DC | PRN
  Administered 2021-05-18: 50 ug via INTRAVENOUS
  Administered 2021-05-18: 100 ug via INTRAVENOUS
  Administered 2021-05-18: 50 ug via INTRAVENOUS

## 2021-05-18 MED ORDER — POLYETHYLENE GLYCOL 3350 17 G PO PACK: 17.0000 g | PACK | Freq: Every day | ORAL | Status: DC | PRN

## 2021-05-18 MED ORDER — DOCUSATE SODIUM 100 MG PO CAPS
100.0000 mg | ORAL_CAPSULE | Freq: Two times a day (BID) | ORAL | Status: DC
  Administered 2021-05-19 (×2): 100 mg via ORAL
  Filled 2021-05-18 (×3): qty 1

## 2021-05-18 MED ORDER — LIDOCAINE-EPINEPHRINE 1 %-1:100000 IJ SOLN
INTRAMUSCULAR | Status: DC | PRN
  Administered 2021-05-18: 10 mL

## 2021-05-18 SURGICAL SUPPLY — 65 items
BAG COUNTER SPONGE SURGICOUNT (BAG) ×3 IMPLANT
BAG SPNG CNTER NS LX DISP (BAG) ×2
BAND INSRT 18 STRL LF DISP RB (MISCELLANEOUS) ×4
BAND RUBBER #18 3X1/16 STRL (MISCELLANEOUS) ×2 IMPLANT
BLADE CLIPPER SURG (BLADE) ×3 IMPLANT
BUR ACORN 9.0 PRECISION (BURR) ×3 IMPLANT
BUR CUTTER 5.0 COARSE DIAMOND (BURR) ×1 IMPLANT
BUR ROUND FLUTED 4 SOFT TCH (BURR) IMPLANT
BUR SPIRAL ROUTER 2.3 (BUR) ×4 IMPLANT
BUR TAPERED ROUTER 3.0 (BURR) ×1 IMPLANT
CANISTER SUCT 3000ML PPV (MISCELLANEOUS) ×7 IMPLANT
CASSETTE SUCT IRRIG SONOPET IQ (MISCELLANEOUS) ×1 IMPLANT
CNTNR URN SCR LID CUP LEK RST (MISCELLANEOUS) ×2 IMPLANT
CONT SPEC 4OZ STRL OR WHT (MISCELLANEOUS) ×3
COVER BURR HOLE 7 (Orthopedic Implant) ×1 IMPLANT
COVER BURR HOLE UNIV 10 (Orthopedic Implant) ×1 IMPLANT
DRAPE HALF SHEET 40X57 (DRAPES) ×3 IMPLANT
DRAPE MICROSCOPE LEICA (MISCELLANEOUS) ×1 IMPLANT
DRAPE NEUROLOGICAL W/INCISE (DRAPES) ×3 IMPLANT
DRAPE WARM FLUID 44X44 (DRAPES) ×3 IMPLANT
DURAPREP 6ML APPLICATOR 50/CS (WOUND CARE) ×3 IMPLANT
ELECT REM PT RETURN 9FT ADLT (ELECTROSURGICAL) ×3
ELECTRODE REM PT RTRN 9FT ADLT (ELECTROSURGICAL) ×2 IMPLANT
FORCEPS BIPOLAR SPETZLER 8 1.0 (NEUROSURGERY SUPPLIES) ×3 IMPLANT
GAUZE 4X4 16PLY ~~LOC~~+RFID DBL (SPONGE) ×2 IMPLANT
GAUZE SPONGE 4X4 12PLY STRL (GAUZE/BANDAGES/DRESSINGS) IMPLANT
GLOVE EXAM NITRILE LRG STRL (GLOVE) IMPLANT
GLOVE SURG ENC MOIS LTX SZ7 (GLOVE) IMPLANT
GLOVE SURG LTX SZ7.5 (GLOVE) ×4 IMPLANT
GLOVE SURG UNDER POLY LF SZ7 (GLOVE) IMPLANT
GLOVE SURG UNDER POLY LF SZ7.5 (GLOVE) ×3 IMPLANT
GOWN STRL REUS W/ TWL LRG LVL3 (GOWN DISPOSABLE) ×4 IMPLANT
GOWN STRL REUS W/ TWL XL LVL3 (GOWN DISPOSABLE) IMPLANT
GOWN STRL REUS W/TWL LRG LVL3 (GOWN DISPOSABLE) ×12
GOWN STRL REUS W/TWL XL LVL3 (GOWN DISPOSABLE) ×3
HEMOSTAT POWDER KIT SURGIFOAM (HEMOSTASIS) ×4 IMPLANT
HEMOSTAT SURGICEL 2X14 (HEMOSTASIS) ×3 IMPLANT
IV NS 1000ML (IV SOLUTION) ×3
IV NS 1000ML BAXH (IV SOLUTION) ×2 IMPLANT
KIT BASIN OR (CUSTOM PROCEDURE TRAY) ×3 IMPLANT
KIT TURNOVER KIT B (KITS) ×3 IMPLANT
KNIFE SONOPET IQ 11 APEX (ORTHOPEDIC DISPOSABLE SUPPLIES) ×1 IMPLANT
MARKER SPHERE PSV REFLC 13MM (MARKER) ×8 IMPLANT
NEEDLE HYPO 22GX1.5 SAFETY (NEEDLE) ×3 IMPLANT
NS IRRIG 1000ML POUR BTL (IV SOLUTION) ×9 IMPLANT
PACK CRANIOTOMY CUSTOM (CUSTOM PROCEDURE TRAY) ×3 IMPLANT
PIN MAYFIELD SKULL DISP (PIN) ×3 IMPLANT
PLATE CRANIAL 12 2H RIGID UNI (Plate) ×3 IMPLANT
SCREW UNIII AXS SD 1.5X4 (Screw) ×16 IMPLANT
SPONGE SURGIFOAM ABS GEL 100 (HEMOSTASIS) ×3 IMPLANT
STAPLER VISISTAT 35W (STAPLE) ×3 IMPLANT
SUT MNCRL AB 3-0 PS2 18 (SUTURE) IMPLANT
SUT MNCRL AB 3-0 PS2 27 (SUTURE) ×1 IMPLANT
SUT MON AB 3-0 SH 27 (SUTURE)
SUT MON AB 3-0 SH27 (SUTURE) IMPLANT
SUT NURALON 4 0 TR CR/8 (SUTURE) ×7 IMPLANT
SUT VIC AB 2-0 CP2 18 (SUTURE) ×5 IMPLANT
TIP SONOPET IQ 12 MICRO CLAW (TIP) ×1 IMPLANT
TIP TISSUE SONOPET IQ STD 12 (TIP) ×1 IMPLANT
TOWEL GREEN STERILE (TOWEL DISPOSABLE) ×3 IMPLANT
TOWEL GREEN STERILE FF (TOWEL DISPOSABLE) ×3 IMPLANT
TRAY FOLEY MTR SLVR 16FR STAT (SET/KITS/TRAYS/PACK) ×3 IMPLANT
TUBE CONNECTING 12X1/4 (SUCTIONS) ×3 IMPLANT
UNDERPAD 30X36 HEAVY ABSORB (UNDERPADS AND DIAPERS) ×3 IMPLANT
WATER STERILE IRR 1000ML POUR (IV SOLUTION) ×3 IMPLANT

## 2021-05-18 NOTE — Anesthesia Procedure Notes (Signed)
Arterial Line Insertion Start/End11/30/2022 11:45 AM, 05/18/2021 12:14 PM Performed by: Albertha Ghee, MD, Genelle Bal, CRNA, anesthesiologist  Patient location: Pre-op. Preanesthetic checklist: patient identified, IV checked, site marked, risks and benefits discussed, surgical consent, monitors and equipment checked, pre-op evaluation, timeout performed and anesthesia consent Lidocaine 1% used for infiltration Left, radial was placed Catheter size: 20 G Hand hygiene performed  and maximum sterile barriers used   Attempts: 3 Procedure performed using ultrasound guided technique. Ultrasound Notes:anatomy identified, needle tip was noted to be adjacent to the nerve/plexus identified and no ultrasound evidence of intravascular and/or intraneural injection Following insertion, dressing applied. Post procedure assessment: normal and unchanged  Post procedure complications: second provider assisted. Patient tolerated the procedure well with no immediate complications.

## 2021-05-18 NOTE — Op Note (Signed)
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 She had successful removal of the left sided component via an OZ and has done well post-operatively, now presents today for resection of the right sided component. This was discussed with the patient as well as risks, benefits, and alternatives and wished to proceed with surgery.   OPERATIVE DETAIL: The patient was taken to the operating room and placed on the OR table in the supine position. A formal time out was performed with two patient identifiers and confirmed the operative site. Anesthesia was induced by the anesthesia team.   Given her prior craniotomy, care was taken while applying the [place] head holder to avoid her prior craniotomy flap. A registration array was attached to the [place]. This was co-registered with the patient's preoperative imaging, the fit appeared to be acceptable. Using frameless stereotaxy, the operative trajectory was planned and the incision was marked. Hair was clipped with surgical clippers over the incision and the area was then prepped and draped in a sterile fashion.  A standard linear pterional incision was placed on the right side down to the zygoma and to midline. Soft tissues were dissected, a subfascial temporalis dissection was performed to expose the zygoma and lateral orbital wall. Burr holes were placed for the craniotomy and osteotomies were performed superiorly and laterally, then across the superior orbit at McCarty's keyhole to  remove a one piece modified OZ bone flap. The bone was again, as expected, impressively hyperostotic. The frontal sinus was noted with stereotaxy and not violated. The thickened bone was hypervascular, not surprisingly, and required constant hemostatic techniques during the bony portion of the case.   For resection, the lateral orbital wall dissection was continued posteriorly, including an extradural clinoidectomy, to decompress the optic nerve ipsilaterally. The ICA was identified and protected extradurally during dissection. The orbit and optic nerve were carefully decompressed proximal to distal. Not surprisingly, this proved quite difficult, the hyperostotic bone was densely adherent and hypervascular. I obtained a 270 degree decompression of the optic nerve, so I further removed infiltrated bone and then obtained hemostasis with a diamond bit drill.   The dura was then opened and flapped anteriorly. Dissection was taken subfrontally, where the largest of the intracranial tumors was located, dissected circumferentially, and removed. I attempted to get the remaining tumor more medially in the planum region, but there was significant arachnoid scarring from her prior surgery and it proved to difficult to do safely, so I obtained hemostasis and closed the dura. Hemostasis was confirmed, the bone flap was carefully replaced to reconstruct the superior and lateral orbital walls cosmetically, then secured with titanium screws and plates. The intracranial tumor that was resected was sent to pathology.  Temporalis was repositioned with suture, all instrument and sponge counts were correct, the incision was then closed in layers. The patient was then returned to anesthesia for emergence. No apparent complications at the completion of the procedure.   EBL:  149mL   DRAINS: none   SPECIMENS: Right dural based tumor  Judith Part, MD 05/18/21 12:38 PM

## 2021-05-18 NOTE — Anesthesia Procedure Notes (Signed)
Procedure Name: Intubation Date/Time: 05/18/2021 12:54 PM Performed by: Genelle Bal, CRNA Pre-anesthesia Checklist: Patient identified, Emergency Drugs available, Suction available and Patient being monitored Patient Re-evaluated:Patient Re-evaluated prior to induction Oxygen Delivery Method: Circle system utilized Preoxygenation: Pre-oxygenation with 100% oxygen Induction Type: IV induction Ventilation: Mask ventilation without difficulty Laryngoscope Size: Mac and 3 Grade View: Grade I Tube type: Oral Tube size: 7.5 mm Number of attempts: 1 Airway Equipment and Method: Stylet and Oral airway Placement Confirmation: ETT inserted through vocal cords under direct vision, positive ETCO2 and breath sounds checked- equal and bilateral Secured at: 23 cm Tube secured with: Tape Dental Injury: Teeth and Oropharynx as per pre-operative assessment  Comments: Intubated by Drucie Opitz, Fernanda Drum

## 2021-05-18 NOTE — H&P (Signed)
Surgical H&P Update  HPI: 50 y.o. 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 No changes in vision or new sx c/w CSF leak, recently had the flu but is recovered and feels back to baseline.   PMHx:  Past Medical History:  Diagnosis Date   Cancer ([place])    Chest pain in adult 07/31/2017   Esophageal reflux 08/18/2014   Gallstones    Headache    Hyperlipidemia with target LDL less than 100 08/18/2015   Internal hemorrhoids 08/18/2014   PONV (postoperative nausea and vomiting)    FamHx:  Family History  Problem Relation Age of Onset   Hyperlipidemia Mother    Heart disease Father    Lung cancer Maternal Aunt    Brain cancer Maternal Aunt    Bone cancer Maternal Aunt    SocHx:  reports that she has never smoked. She has never used smokeless tobacco. She reports that she does not drink alcohol and does not use drugs.  Physical Exam: AOx3, PERRL, FS, TM, VF with decreased acuity OS in all fields, grossly intact OD Strength 5/5 x4, SILTx4  Assesment/Plan: 50 y.o. 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 Risks, benefits, and alternatives discussed and the patient would like to continue with surgery.  -OR today -4N ICU post-op  Judith Part, MD 05/18/21 12:36 PM

## 2021-05-18 NOTE — Anesthesia Postprocedure Evaluation (Signed)
Anesthesia Post Note  Patient: Laurie Kelley  Procedure(s) Performed: Right Orbitozygomatic craniotomy for tumor with brainlab (Right: Head) [place]     Patient location during evaluation: PACU Anesthesia Type: General Level of consciousness: awake and alert Pain management: pain level controlled Vital Signs Assessment: post-procedure vital signs reviewed and stable Respiratory status: spontaneous breathing, nonlabored ventilation, respiratory function stable and patient connected to nasal cannula oxygen Cardiovascular status: blood pressure returned to baseline and stable Postop Assessment: no apparent nausea or vomiting Anesthetic complications: no   No notable events documented.  Last Vitals:  Vitals:   05/18/21 2000 05/18/21 2027  BP: (!) 123/58 134/63  Pulse: 93 88  Resp: (!) 25 (!) 25  Temp: 36.5 C 36.5 C  SpO2: 100% 100%    Last Pain:  Vitals:   05/18/21 2027  TempSrc: Oral  PainSc:                  Audry Pili

## 2021-05-18 NOTE — Transfer of Care (Signed)
Immediate Anesthesia Transfer of Care Note  Patient: Laurie Kelley  Procedure(s) Performed: Right Orbitozygomatic craniotomy for tumor with brainlab (Right: Head) APPLICATION OF CRANIAL NAVIGATION  Patient Location: PACU  Anesthesia Type:General  Level of Consciousness: drowsy  Airway & Oxygen Therapy: Patient Spontanous Breathing and Patient connected to face mask oxygen  Post-op Assessment: Report given to RN, Post -op Vital signs reviewed and stable and Patient moving all extremities  Post vital signs: Reviewed and stable  Last Vitals:  Vitals Value Taken Time  BP 140/61 05/18/21 1911  Temp 37.4 C 05/18/21 1911  Pulse 104 05/18/21 1921  Resp 26 05/18/21 1921  SpO2 98 % 05/18/21 1921  Vitals shown include unvalidated device data.  Last Pain:  Vitals:   05/18/21 1026  TempSrc:   PainSc: 3       Patients Stated Pain Goal: 2 (85/02/77 4128)  Complications: No notable events documented.

## 2021-05-18 NOTE — Anesthesia Preprocedure Evaluation (Signed)
Anesthesia Evaluation  Patient identified by MRN, date of birth, ID band Patient awake    Reviewed: Allergy & Precautions, H&P , NPO status , Patient's Chart, lab work & pertinent test results  History of Anesthesia Complications (+) PONV and history of anesthetic complications  Airway Mallampati: II   Neck ROM: full    Dental   Pulmonary neg pulmonary ROS,    breath sounds clear to auscultation       Cardiovascular negative cardio ROS   Rhythm:regular Rate:Normal     Neuro/Psych  Headaches, S/p craniotomy for brain mass (02/18/21)    GI/Hepatic GERD  ,  Endo/Other    Renal/GU      Musculoskeletal   Abdominal   Peds  Hematology   Anesthesia Other Findings   Reproductive/Obstetrics                             Anesthesia Physical Anesthesia Plan  ASA: 2  Anesthesia Plan: General   Post-op Pain Management:    Induction: Intravenous  PONV Risk Score and Plan: 4 or greater and Ondansetron, Dexamethasone, Midazolam and Treatment may vary due to age or medical condition  Airway Management Planned: Oral ETT  Additional Equipment: Arterial line  Intra-op Plan:   Post-operative Plan: Extubation in OR  Informed Consent: I have reviewed the patients History and Physical, chart, labs and discussed the procedure including the risks, benefits and alternatives for the proposed anesthesia with the patient or authorized representative who has indicated his/her understanding and acceptance.     Dental advisory given  Plan Discussed with: CRNA, Anesthesiologist and Surgeon  Anesthesia Plan Comments:         Anesthesia Quick Evaluation

## 2021-05-18 NOTE — Progress Notes (Signed)
Paged Neurosurgery on call, patient has vomited multiple times causing her throat to burn. Zofran given with no relief. Other medication, phenegran, is ordered PO. Neurosurgery changed phenegran to IV and placed new order for compazine rectal if that doesn't help. Will continue to monitor.

## 2021-05-19 ENCOUNTER — Encounter (HOSPITAL_COMMUNITY): Payer: Self-pay | Admitting: Neurological Surgery

## 2021-05-19 ENCOUNTER — Inpatient Hospital Stay (HOSPITAL_COMMUNITY): Payer: Commercial Managed Care - PPO

## 2021-05-19 LAB — BPAM RBC
Blood Product Expiration Date: 202212152359
Blood Product Expiration Date: 202212202359
ISSUE DATE / TIME: 202211301549
ISSUE DATE / TIME: 202211301549
Unit Type and Rh: 7300
Unit Type and Rh: 7300

## 2021-05-19 LAB — TYPE AND SCREEN
ABO/RH(D): B POS
Antibody Screen: NEGATIVE
Unit division: 0
Unit division: 0

## 2021-05-19 MED ORDER — OXYCODONE-ACETAMINOPHEN 5-325 MG PO TABS
1.0000 | ORAL_TABLET | ORAL | Status: DC | PRN
  Administered 2021-05-19 – 2021-05-20 (×4): 2 via ORAL
  Filled 2021-05-19 (×4): qty 2

## 2021-05-19 MED ORDER — OXYCODONE HCL 5 MG PO TABS
5.0000 mg | ORAL_TABLET | ORAL | Status: DC | PRN
Start: 2021-05-19 — End: 2021-05-19
  Administered 2021-05-19 (×3): 10 mg via ORAL
  Filled 2021-05-19 (×3): qty 2

## 2021-05-19 MED ORDER — GADOBUTROL 1 MMOL/ML IV SOLN
8.0000 mL | Freq: Once | INTRAVENOUS | Status: AC | PRN
  Administered 2021-05-19: 8 mL via INTRAVENOUS

## 2021-05-19 NOTE — Plan of Care (Signed)
  Problem: Activity: Goal: Risk for activity intolerance will decrease Outcome: Progressing   Problem: Pain Managment: Goal: General experience of comfort will improve Outcome: Progressing   Problem: Safety: Goal: Ability to remain free from injury will improve Outcome: Progressing   

## 2021-05-19 NOTE — Progress Notes (Signed)
Occupational Therapy Evaluation  PTA pt had assistance from family to help with IADL tasks since last surgery. Pt reports vision L eye has improved however is "still blurry". Pt currently requires minA with ADL due to deficits listed below. Will follow acutely to maximize functional level of independence to facilitate safe DC home. Do not anticipate the need for OT follow up at this time.     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 No difficulties  Pain Assessment  Pain Assessment 0-10  Pain Score 7  Pain Location head  Pain Descriptors / Indicators Grimacing;Guarding  Pain Intervention(s) Limited activity within patient's tolerance  Cognition  Arousal/Alertness Awake/alert  Behavior During Therapy Flat affect  Overall Cognitive Status  (Will further assess; most likely close to baseline; will further assess; dtr reports her Mom "seems like herself")   General Comments cognition affected byheadache at this time  Upper Extremity Assessment  Upper Extremity Assessment Overall WFL for tasks assessed  Lower Extremity Assessment  Lower Extremity Assessment Defer to PT evaluation  Cervical / Trunk Assessment  Cervical / Trunk Assessment Normal  Vision- History  Patient Visual Report Blurring of vision  Vision- Assessment  Vision Assessment? Vision impaired- to be further tested in functional context  Additional Comments L eye - vision impaired - "blurry" vision which pt states has improved since last surgery; R eye swollen making assessment of vision difficult; ptplans to follow up with her eye doctor when deemed appropriate by her neuro surgeon.  Bed Mobility  Overal bed mobility Needs Assistance  Bed Mobility Supine to Sit  Supine to sit Supervision  General bed mobility comments supervision for safety  Transfers  Overall transfer level Needs assistance  Equipment used 1 person hand held assist  Transfers Sit to/from Stand  Sit to Stand Min assist  General transfer comment minA to rise and steady  Balance  Overall balance assessment Mild deficits observed, not formally tested  OT - End of Session  Equipment Utilized During Treatment Gait belt  Activity Tolerance Patient tolerated treatment well  Patient left in chair;with call bell/phone within reach;with family/visitor present  Nurse Communication Mobility status  OT Assessment  OT Recommendation/Assessment Patient needs continued OT Services  OT Visit Diagnosis Unsteadiness on feet (R26.81);Low vision, both eyes (H54.2);Pain  Pain - part of body  (head)  OT Problem List Decreased activity tolerance;Impaired balance (sitting and/or standing);Decreased safety awareness;Decreased knowledge of use of DME or AE;Pain  OT Plan  OT Frequency (ACUTE ONLY) Min 2X/week  OT Treatment/Interventions (ACUTE ONLY) Self-care/ADL  training;Energy conservation;DME and/or AE  instruction;Therapeutic activities;Patient/family education;Balance training  AM-PAC OT "6 Clicks" Daily Activity Outcome Measure (Version 2)  Help from another person eating meals? 4  Help from another person taking care of personal grooming? 3  Help from another person toileting, which includes using toliet, bedpan, or urinal? 3  Help from another person bathing (including washing, rinsing, drying)? 3  Help from another person to put on and taking off regular upper body clothing? 3  Help from another person to put on and taking off regular lower body clothing? 3  6 Click Score 19  Progressive Mobility  What is the highest level of mobility based on the progressive mobility assessment? Level 5 (Walks with assist in room/hall) - Balance while stepping forward/back and can walk in room with assist - Complete  Mobility Ambulated with assistance in room  OT Recommendation  Follow Up Recommendations No OT follow up  Assistance recommended at discharge Frequent or constant Supervision/Assistance  Functional Status Assessent Patient has had a recent decline in their functional status and demonstrates the ability to make significant improvements in function in a reasonable and predictable amount of time.  OT Equipment None recommended by OT  Individuals Consulted  Consulted and Agree with Results and Recommendations Patient  Acute Rehab OT Goals  Patient Stated Goal to have her vision improve  OT Goal Formulation With patient  Time For Goal Achievement 06/02/21  Potential to Achieve Goals Good  OT Time Calculation  OT Start Time (ACUTE ONLY) 1053  OT Stop Time (ACUTE ONLY) 1118  OT Time Calculation (min) 25 min  OT General Charges  $OT Visit 1 Visit  OT Evaluation  $OT Eval Moderate Complexity 1 Mod  Written Expression  Dominant Hand Right  Maurie Boettcher, OT/L   Acute OT Clinical Specialist Acute Rehabilitation Services Pager [phone] Office [phone]

## 2021-05-19 NOTE — Evaluation (Addendum)
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 Marland Kitchen PMH: stage 1 L crani on 9/2  Clinical Impression  Patient admitted with above diagnosis. Patient presents with generalized weakness, impaired balance, decreased activity tolerance, and slow processing. Patient ambulated in room with minA+2 with HHAx2 for stability. Patient feels like she will progress quickly and does not want follow therapy. Patient has good family support at discharge. Patient will benefit from skilled PT services during acute stay to address listed deficits, but no PT follow up recommended at this time. Will continue to assess.      Recommendations for follow up therapy are one component of a multi-disciplinary discharge planning process, led by the attending physician.  Recommendations may be updated based on patient status, additional functional criteria and insurance authorization.  Follow Up Recommendations No PT follow up (per patient request)    Assistance Recommended at Discharge Intermittent Supervision/Assistance  Functional Status Assessment Patient has had a recent decline in their functional status and demonstrates the ability to make significant improvements in function in a reasonable and predictable amount of time.  Equipment Recommendations  None recommended by PT    Recommendations for Other Services       Precautions / Restrictions Precautions Precautions: Fall Restrictions Weight Bearing Restrictions: No      Mobility  Bed Mobility Overal bed mobility: Needs Assistance Bed Mobility: Supine to Sit     Supine to sit: Supervision     General bed mobility comments: supervision for safety    Transfers Overall transfer level: Needs  assistance Equipment used: 1 person hand held assist Transfers: Sit to/from Stand Sit to Stand: Min assist           General transfer comment: minA to rise and steady    Ambulation/Gait Ambulation/Gait assistance: Min assist;+2 safety/equipment Gait Distance (Feet): 20 Feet Assistive device: 2 person hand held assist Gait Pattern/deviations: Step-through pattern;Decreased stride length Gait velocity: decreased     General Gait Details: slow hesitant gait. +2 HHA for support and stability. May benefit from Rw  Stairs            Wheelchair Mobility    Modified Rankin (Stroke Patients Only)       Balance Overall balance assessment: Mild deficits observed, not formally tested                                           Pertinent Vitals/Pain Pain Assessment: 0-10 Pain Score: 7  Pain Location: head Pain Descriptors / Indicators: Grimacing;Guarding Pain Intervention(s): Monitored during session;Repositioned    Home Living Family/patient expects to be discharged to:: Private residence Living Arrangements: Alone Available Help at Discharge: Family;Available 24 hours/day Type of Home: House Home Access: Ramped entrance       Home Layout: One level Home Equipment: Shower seat;Hospital bed;Rolling Environmental consultant (2 wheels)      Prior Function Prior Level of Function : Independent/Modified Independent                     Hand Dominance        Extremity/Trunk Assessment   Upper Extremity Assessment Upper Extremity Assessment: Defer to OT evaluation  Lower Extremity Assessment Lower Extremity Assessment: Generalized weakness    Cervical / Trunk Assessment Cervical / Trunk Assessment: Normal  Communication   Communication: No difficulties  Cognition Arousal/Alertness: Awake/alert Behavior During Therapy: Flat affect Overall Cognitive Status: Impaired/Different from baseline Area of Impairment: Problem solving                              Problem Solving: Slow processing General Comments: slow processing noted with commands. Motivated to mobilize        General Comments      Exercises     Assessment/Plan    PT Assessment Patient needs continued PT services  PT Problem List Decreased strength;Decreased activity tolerance;Decreased balance;Decreased mobility       PT Treatment Interventions Gait training;DME instruction;Functional mobility training;Therapeutic activities;Therapeutic exercise;Balance training;Patient/family education    PT Goals (Current goals can be found in the Care Plan section)  Acute Rehab PT Goals Patient Stated Goal: to go home PT Goal Formulation: With patient Time For Goal Achievement: 06/02/21 Potential to Achieve Goals: Good    Frequency Min 3X/week   Barriers to discharge        Co-evaluation               AM-PAC PT "6 Clicks" Mobility  Outcome Measure Help needed turning from your back to your side while in a flat bed without using bedrails?: A Little Help needed moving from lying on your back to sitting on the side of a flat bed without using bedrails?: A Little Help needed moving to and from a bed to a chair (including a wheelchair)?: A Little Help needed standing up from a chair using your arms (e.g., wheelchair or bedside chair)?: A Little Help needed to walk in hospital room?: A Little Help needed climbing 3-5 steps with a railing? : A Little 6 Click Score: 18    End of Session Equipment Utilized During Treatment: Gait belt Activity Tolerance: Patient tolerated treatment well Patient left: in chair;with call bell/phone within reach;with family/visitor present Nurse Communication: Mobility status PT Visit Diagnosis: Unsteadiness on feet (R26.81);Muscle weakness (generalized) (M62.81)    Time: 4174-0814 PT Time Calculation (min) (ACUTE ONLY): 23 min   Charges:   PT Evaluation $PT Eval Moderate Complexity: 1 Mod          Aime Carreras A.  Gilford Rile PT, DPT Acute Rehabilitation Services Pager [phone] Office [phone]   Linna Hoff 05/19/2021, 12:24 PM

## 2021-05-20 ENCOUNTER — Other Ambulatory Visit: Payer: Self-pay

## 2021-05-20 LAB — SURGICAL PATHOLOGY

## 2021-05-20 MED ORDER — OXYCODONE-ACETAMINOPHEN 5-325 MG PO TABS: 1.0000 | ORAL_TABLET | ORAL | 0 refills | Status: DC | PRN

## 2021-05-20 MED FILL — Thrombin For Soln 5000 Unit: CUTANEOUS | Qty: 5000 | Status: AC

## 2021-05-20 NOTE — Discharge Summary (Signed)
Discharge Summary  Date of Admission: 05/18/2021  Date of Discharge: 05/20/21  Attending Physician: Emelda Brothers, MD  Hospital Course: Patient was admitted following an uncomplicated R OZ for resection of a complex anterolateral skull base mass. She was recovered in PACU and transferred to 4N ICU. Post-op MRI showed good resection and decompression of the optic nerve, expected subtotal resection. Her hospital course was uncomplicated and the patient was discharged home on 05/20/21. She will follow up in clinic with me in 2 weeks.  Neurologic exam at discharge:  R eye swollen shut, Cns o/w intact Strength 5/5 x4, SILTx4, no drift  Discharge diagnosis: Brain tumor  Judith Part, MD 05/20/21 9:25 AM

## 2021-05-20 NOTE — Progress Notes (Signed)
Neurosurgery Service Progress Note  Subjective: No acute events overnight, no new complaints today   Objective: Vitals:   05/19/21 1954 05/20/21 0002 05/20/21 0257 05/20/21 0833  BP: (!) 141/71 134/75 (!) 141/73 123/73  Pulse:  90 87 93  Resp: 20 20 20 20   Temp: 98.6 F (37 C) 99.4 F (37.4 C) 98.7 F (37.1 C) 98 F (36.7 C)  TempSrc: Oral Oral Oral Oral  SpO2:  96% 96% 98%  Weight:      Height:        Physical Exam: AOx3, R eye swollen shut, stable visual acuity OS w/ full EOM OS, FS, TM, Strength 5/5 x4, SILTx4, no drift Incision c/d/i  Assessment & Plan: 50 y.o. woman s/p R OZ for tumor resection, MRI w/ good resection, expected subtotal, good optic nerve decompression, recovering well.  -discharge home today  Judith Part  05/20/21 9:22 AM

## 2021-05-20 NOTE — Progress Notes (Signed)
D/C education given to Pt and all questions answered. No printed prescriptions to give or equipment to deliver. IV removed. Pt taken to car with all belongings.   

## 2021-05-24 ENCOUNTER — Other Ambulatory Visit: Payer: Self-pay | Admitting: Radiation Therapy

## 2021-05-30 ENCOUNTER — Telehealth: Payer: Self-pay | Admitting: Licensed Clinical Social Worker

## 2021-05-30 ENCOUNTER — Other Ambulatory Visit: Payer: Self-pay | Admitting: Radiation Therapy

## 2021-05-30 DIAGNOSIS — D329 Benign neoplasm of meninges, unspecified: Secondary | ICD-10-CM

## 2021-05-30 NOTE — Telephone Encounter (Signed)
Scheduled appt per 12/12 referral. Pt is aware of appt date and time. Pt had requested a virtual visit.

## 2021-06-01 ENCOUNTER — Telehealth: Payer: Self-pay

## 2021-06-01 NOTE — Telephone Encounter (Signed)
Transition Care Management Follow-up Telephone Call Date of discharge and from where: 05/20/2021  Laurie Kelley How have you been since you were released from the hospital? "Having pain and pain meds make me nauseated" Any questions or concerns? No  Items Reviewed: Did the pt receive and understand the discharge instructions provided? Yes  Medications obtained and verified? Yes  Other? No  Any new allergies since your discharge? No  Dietary orders reviewed? Yes Do you have support at home? Yes   Home Care and Equipment/Supplies: Were home health services ordered? not applicable If so, what is the name of the agency?  Has the agency set up a time to come to the patient's home? not applicable Were any new equipment or medical supplies ordered?  no What is the name of the medical supply agency?  Were you able to get the supplies/equipment?  Do you have any questions related to the use of the equipment or supplies?   Functional Questionnaire: (I = Independent and D = Dependent) ADLs: I  Bathing/Dressing- I  Meal Prep- I  Eating- I  Maintaining continence- I  Transferring/Ambulation- I  Managing Meds- I  Follow up appointments reviewed:  PCP Hospital f/u appt confirmed? No   Specialist Hospital f/u appt confirmed? Yes  Scheduled to see SURGEON on 06/02/2021  Are transportation arrangements needed? Yes  If their condition worsens, is the pt aware to call PCP or go to the Emergency Dept.? Yes Was the patient provided with contact information for the PCP's office or ED? Yes Was to pt encouraged to call back with questions or concerns? Yes  Tomasa Rand, RN, BSN, CEN [facility] [employer] [phone]

## 2021-06-17 ENCOUNTER — Ambulatory Visit: Payer: Commercial Managed Care - PPO | Admitting: Family Medicine

## 2021-06-27 ENCOUNTER — Ambulatory Visit (HOSPITAL_BASED_OUTPATIENT_CLINIC_OR_DEPARTMENT_OTHER): Payer: Commercial Managed Care - PPO | Admitting: Licensed Clinical Social Worker

## 2021-06-27 ENCOUNTER — Encounter: Payer: Self-pay | Admitting: Licensed Clinical Social Worker

## 2021-06-27 DIAGNOSIS — D329 Benign neoplasm of meninges, unspecified: Secondary | ICD-10-CM

## 2021-06-27 DIAGNOSIS — Z8489 Family history of other specified conditions: Secondary | ICD-10-CM | POA: Diagnosis not present

## 2021-06-27 HISTORY — DX: Family history of other specified conditions: Z84.89

## 2021-06-27 NOTE — Progress Notes (Signed)
REFERRING PROVIDER: Judith Part, MD [place],  [place] [address]  PRIMARY PROVIDER:  Janora Norlander, DO  PRIMARY REASON FOR VISIT:  1. Meningioma ([place])   2. Family history of brain tumor    I connected with Laurie Kelley on 06/27/2021 at 1:45 PM EDT by MyChart video conference and verified that I am speaking with the correct person using two identifiers.    Patient location: home Provider location: [facility]   HISTORY OF PRESENT ILLNESS:   Laurie Kelley, a 51 y.o. female, was seen for a [place] cancer genetics consultation at the request of Dr. Zada Finders due to a personal and family history of menginomas.  Laurie Kelley presents to clinic today to discuss the possibility of a hereditary predisposition to cancer, genetic testing, and to further clarify her future cancer risks, as well as potential cancer risks for family members.   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 This has been treated with surgery. She does not report history of any other brain tumors or schwannomas, neurofibromas, or cataracts.   CANCER HISTORY:  Oncology History   No history exists.     RISK FACTORS:  Menarche was at age 39.  First live birth at age 67.  Ovaries intact: yes.  Hysterectomy: no.  Colonoscopy: no; not examined. Mammogram within the last year: no. Number of breast biopsies: 0. Any excessive radiation exposure in the past: no  Past Medical History:  Diagnosis Date   Cancer ([place])    Chest pain in adult 07/31/2017   Esophageal reflux 08/18/2014   Family history of brain tumor    Gallstones    Headache    Hyperlipidemia with target LDL less than 100 08/18/2015   Internal hemorrhoids 08/18/2014   PONV (postoperative nausea and vomiting)     Past Surgical History:  Procedure Laterality Date   APPLICATION OF CRANIAL NAVIGATION Left 02/18/2021   Procedure: APPLICATION OF CRANIAL NAVIGATION;  Surgeon:  Judith Part, MD;  Location: [place];  Service: Neurosurgery;  Laterality: Left;   APPLICATION OF CRANIAL NAVIGATION N/A 05/18/2021   Procedure: APPLICATION OF CRANIAL NAVIGATION;  Surgeon: Judith Part, MD;  Location: [place];  Service: Neurosurgery;  Laterality: N/A;   BRAIN SURGERY     CHOLECYSTECTOMY  1993   CRANIOTOMY Left 02/18/2021   Procedure: Left orbitozygomatic craniotomy for tumor resection  with Brain Lab;  Surgeon: Judith Part, MD;  Location: [place];  Service: Neurosurgery;  Laterality: Left;   CRANIOTOMY Right 05/18/2021   Procedure: Right Orbitozygomatic craniotomy for tumor with brainlab;  Surgeon: Judith Part, MD;  Location: [place];  Service: Neurosurgery;  Laterality: Right;   SHOULDER ARTHROSCOPY Right 11/06/2019   Procedure: ARTHROSCOPY RIGHT SHOULDER, DEBRIDEMENT X TWO STRUCTURES; OPEN ROTATOR CUFF REPAIR;  Surgeon: Carole Civil, MD;  Location: [facility];  Service: Orthopedics;  Laterality: Right;    Social History   Socioeconomic History   Marital status: Divorced    Spouse name: Not on file   Number of children: Not on file   Years of education: Not on file   Highest education level: Not on file  Occupational History   Not on file  Tobacco Use   Smoking status: Never   Smokeless tobacco: Never  Vaping Use   Vaping Use: Never used  Substance and Sexual Activity   Alcohol use: No   Drug use: No   Sexual activity: Not Currently  Other Topics Concern   Not on file  Social History Narrative   Not on file   Social Determinants of Health   Financial Resource Strain: Not on file  Food Insecurity: Not on file  Transportation Needs: Not on file  Physical Activity: Not on file  Stress: Not on file  Social Connections: Not on file     FAMILY HISTORY:  We obtained a detailed, 4-generation family history.  Significant diagnoses are listed below: Family History  Problem Relation Age of Onset   Hyperlipidemia Mother    Heart  disease Father    Other Sister        meningiomas/tumors x11   Brain cancer Maternal Aunt        d. 76s-60s   Lung cancer Maternal Aunt    Bone cancer Maternal Aunt        d. 50s-60s   Liver cancer Paternal Uncle    Lung cancer Paternal Grandmother        d.85   Laurie Kelley has 2 daughters (58 and 60) and 1 grandson (12). She has 2 sisters and 1 brother. One of her sisters has a total of 11 brain tumors (patient believes meningiomas) that she was diagnosed with beginning in her 102s and she is now 49. She has not had surgery for these, they've been treated with radiation.   Laurie Kelley mother is living at 21 with no history of cancer. Patient has 4 maternal aunts, 4 maternal uncles. One aunt had brain and lung cancer in her 31s-60s, another aunt had bone cancer in her 63s-60s. Maternal grandmother died in her 28s, grandfather died in his 50s.  Laurie Kelley father is living at 64. Patient has 4 paternal aunts, 4 paternal uncles. One uncle died of liver cancer in his 3s. Paternal grandmother died in her 9s of lung cancer, she had history of smoking. Paternal grandfather died in his 11s.   Laurie Kelley is unaware of previous family history of genetic testing for hereditary cancer risks. Patient's maternal ancestors are of unknown descent, and paternal ancestors are of unknown descent. There is no reported Ashkenazi Jewish ancestry. There is no known consanguinity.    GENETIC COUNSELING ASSESSMENT: Laurie Kelley is a 51 y.o. female with a personal and family history of meningiomas which is somewhat suggestive of a hereditary cancer syndrome such as NF2 and predisposition to cancer. We, therefore, discussed and recommended the following at today's visit.   DISCUSSION: We discussed that approximately 10% of cancer in general is hereditary. Meningiomas are usually sporadic, however they are also associated with Neurofibromatosis type 2 (NF2).  We discussed this condition, noting other findings  associated with NF2 such as vestibular schwannomas and cataracts.There are other genes associated with meningiomas and hereditary cancer as well. Cancers and risks are gene specific.  We discussed that testing is beneficial for several reasons including knowing about cancer risks, identifying potential screening and risk-reduction options that may be appropriate, and to understand if other family members could be at risk for cancer and allow them to undergo genetic testing.   We reviewed the characteristics, features and inheritance patterns of hereditary cancer syndromes. We also discussed genetic testing, including the appropriate family members to test, the process of testing, insurance coverage and turn-around-time for results. We discussed the implications of a negative, positive and/or variant of uncertain significant result. We recommended Laurie Kelley pursue genetic testing for the [facility] CancerNext-Expanded+RNA gene panel.   The CancerNext-Expanded + RNAinsight gene panel offered by [facility] and includes sequencing and rearrangement analysis for the following 77 genes: IP, ALK, APC*, ATM*, AXIN2, BAP1, BARD1, BLM, BMPR1A, BRCA1*, BRCA2*, BRIP1*, CDC73, CDH1*,CDK4, CDKN1B, CDKN2A, CHEK2*, CTNNA1, DICER1, FANCC, FH, FLCN, GALNT12, KIF1B, LZTR1, MAX, MEN1, MET, MLH1*, MSH2*, MSH3, MSH6*, MUTYH*, NBN, NF1*, NF2, NTHL1, PALB2*, PHOX2B, PMS2*, POT1, PRKAR1A, PTCH1, PTEN*, RAD51C*, RAD51D*,RB1, RECQL, RET, SDHA, SDHAF2, SDHB, SDHC, SDHD, SMAD4, SMARCA4, SMARCB1, SMARCE1, STK11, SUFU, TMEM127, TP53*,TSC1, TSC2, VHL and XRCC2 (sequencing and deletion/duplication); EGFR, EGLN1, HOXB13, KIT, MITF, PDGFRA, POLD1 and POLE (sequencing only); EPCAM and GREM1 (deletion/duplication only).  Based on Laurie Kelley's personal and family history of meningiomas, she meets medical criteria for genetic testing. Despite that she meets criteria, she may still have an out of pocket cost. We discussed that if her out of  pocket cost for testing is over $100, the laboratory will call and confirm whether she wants to proceed with testing.  If the out of pocket cost of testing is less than $100 she will be billed by the genetic testing laboratory.   PLAN: After considering the risks, benefits, and limitations, Laurie Kelley provided informed consent to pursue genetic testing. She will have blood drawn on 07/01/2020 and the blood sample will be sent to [facility] for analysis of the CancerNext-Expanded+RNA panel. Results should be available within approximately 2-3 weeks' time, at which point they will be disclosed by telephone to Laurie Kelley, as will any additional recommendations warranted by these results. Laurie Kelley will receive a summary of her genetic counseling visit and a copy of her results once available. This information will also be available in Epic.   Laurie Kelley questions were answered to her satisfaction today. Our contact information was provided should additional questions or concerns arise. Thank you for the referral and allowing [place] to share in the care of your patient.   Faith Rogue, MS, [facility] Genetic Counselor [email or web address] Phone: [phone]  The patient was seen for a total of 30 minutes in virtual genetic counseling.  Patient was seen alone. Dr. Grayland Ormond was available for discussion regarding this case.   _______________________________________________________________________ For Office Staff:  Number of people involved in session: 1 Was an Intern/ student involved with case: no

## 2021-06-28 ENCOUNTER — Other Ambulatory Visit: Payer: Self-pay | Admitting: Licensed Clinical Social Worker

## 2021-06-28 DIAGNOSIS — D329 Benign neoplasm of meninges, unspecified: Secondary | ICD-10-CM

## 2021-06-28 DIAGNOSIS — Z8489 Family history of other specified conditions: Secondary | ICD-10-CM

## 2021-06-29 ENCOUNTER — Inpatient Hospital Stay: Payer: Commercial Managed Care - PPO | Attending: Genetic Counselor

## 2021-06-29 ENCOUNTER — Other Ambulatory Visit: Payer: Self-pay

## 2021-06-29 DIAGNOSIS — Z8489 Family history of other specified conditions: Secondary | ICD-10-CM

## 2021-06-29 DIAGNOSIS — D329 Benign neoplasm of meninges, unspecified: Secondary | ICD-10-CM

## 2021-06-29 LAB — GENETIC SCREENING ORDER

## 2021-07-01 ENCOUNTER — Ambulatory Visit (INDEPENDENT_AMBULATORY_CARE_PROVIDER_SITE_OTHER): Payer: Commercial Managed Care - PPO

## 2021-07-01 ENCOUNTER — Other Ambulatory Visit: Payer: Commercial Managed Care - PPO

## 2021-07-01 ENCOUNTER — Other Ambulatory Visit: Payer: Self-pay

## 2021-07-01 DIAGNOSIS — Z3042 Encounter for surveillance of injectable contraceptive: Secondary | ICD-10-CM | POA: Diagnosis not present

## 2021-07-01 LAB — PREGNANCY, URINE: Preg Test, Ur: NEGATIVE

## 2021-07-01 MED ORDER — MEDROXYPROGESTERONE ACETATE 150 MG/ML IM SUSP: 150.0000 mg | INTRAMUSCULAR | 1 refills | Status: DC

## 2021-07-01 MED ORDER — MEDROXYPROGESTERONE ACETATE 150 MG/ML IM SUSP
150.0000 mg | INTRAMUSCULAR | Status: DC
  Administered 2021-07-01: 150 mg via INTRAMUSCULAR

## 2021-07-01 NOTE — Progress Notes (Signed)
Medroxyprogesterone injection given to right upper outer quadrant.  Patient tolerated well. 

## 2021-07-01 NOTE — Telephone Encounter (Signed)
Patient needs a refill of her Medroxyprogesterone until it is time for her pap smear in July.  Medication refill sent to [facility].

## 2021-07-20 ENCOUNTER — Other Ambulatory Visit: Payer: Self-pay | Admitting: Family Medicine

## 2021-07-26 ENCOUNTER — Telehealth: Payer: Self-pay

## 2021-07-26 ENCOUNTER — Ambulatory Visit (INDEPENDENT_AMBULATORY_CARE_PROVIDER_SITE_OTHER): Payer: Commercial Managed Care - PPO | Admitting: Family Medicine

## 2021-07-26 ENCOUNTER — Encounter: Payer: Self-pay | Admitting: Family Medicine

## 2021-07-26 VITALS — BP 119/74 | HR 54 | Temp 97.6°F | Ht 65.0 in | Wt 185.4 lb

## 2021-07-26 DIAGNOSIS — R220 Localized swelling, mass and lump, head: Secondary | ICD-10-CM | POA: Diagnosis not present

## 2021-07-26 DIAGNOSIS — R609 Edema, unspecified: Secondary | ICD-10-CM

## 2021-07-26 DIAGNOSIS — Z9889 Other specified postprocedural states: Secondary | ICD-10-CM | POA: Diagnosis not present

## 2021-07-26 LAB — CMP14+EGFR
ALT: 21 IU/L (ref 0–32)
AST: 18 IU/L (ref 0–40)
Albumin/Globulin Ratio: 2 (ref 1.2–2.2)
Albumin: 4.3 g/dL (ref 3.8–4.8)
Alkaline Phosphatase: 209 IU/L — ABNORMAL HIGH (ref 44–121)
BUN/Creatinine Ratio: 14 (ref 9–23)
BUN: 11 mg/dL (ref 6–24)
Bilirubin Total: 0.3 mg/dL (ref 0.0–1.2)
CO2: 23 mmol/L (ref 20–29)
Calcium: 10.8 mg/dL — ABNORMAL HIGH (ref 8.7–10.2)
Chloride: 106 mmol/L (ref 96–106)
Creatinine, Ser: 0.78 mg/dL (ref 0.57–1.00)
Globulin, Total: 2.1 g/dL (ref 1.5–4.5)
Glucose: 106 mg/dL — ABNORMAL HIGH (ref 70–99)
Potassium: 4.5 mmol/L (ref 3.5–5.2)
Sodium: 140 mmol/L (ref 134–144)
Total Protein: 6.4 g/dL (ref 6.0–8.5)
eGFR: 94 mL/min/{1.73_m2} (ref >59)

## 2021-07-26 LAB — URINALYSIS, ROUTINE W REFLEX MICROSCOPIC
Bilirubin, UA: NEGATIVE
Glucose, UA: NEGATIVE
Ketones, UA: NEGATIVE
Nitrite, UA: NEGATIVE
Protein,UA: NEGATIVE
RBC, UA: NEGATIVE
Specific Gravity, UA: 1.02 (ref 1.005–1.030)
Urobilinogen, Ur: 0.2 mg/dL (ref 0.2–1.0)
pH, UA: 6 (ref 5.0–7.5)

## 2021-07-26 LAB — CBC WITH DIFFERENTIAL/PLATELET
Basophils Absolute: 0 10*3/uL (ref 0.0–0.2)
Basos: 1 %
EOS (ABSOLUTE): 0.1 10*3/uL (ref 0.0–0.4)
Eos: 2 %
Hematocrit: 39.5 % (ref 34.0–46.6)
Hemoglobin: 12.8 g/dL (ref 11.1–15.9)
Immature Grans (Abs): 0 10*3/uL (ref 0.0–0.1)
Immature Granulocytes: 0 %
Lymphocytes Absolute: 1.4 10*3/uL (ref 0.7–3.1)
Lymphs: 34 %
MCH: 28.7 pg (ref 26.6–33.0)
MCHC: 32.4 g/dL (ref 31.5–35.7)
MCV: 89 fL (ref 79–97)
Monocytes Absolute: 0.2 10*3/uL (ref 0.1–0.9)
Monocytes: 5 %
Neutrophils Absolute: 2.4 10*3/uL (ref 1.4–7.0)
Neutrophils: 58 %
Platelets: 330 10*3/uL (ref 150–450)
RBC: 4.46 x10E6/uL (ref 3.77–5.28)
RDW: 13.1 % (ref 11.7–15.4)
WBC: 4.2 10*3/uL (ref 3.4–10.8)

## 2021-07-26 LAB — MICROSCOPIC EXAMINATION
RBC, Urine: NONE SEEN /hpf (ref 0–2)
Renal Epithel, UA: NONE SEEN /hpf

## 2021-07-26 LAB — TSH: TSH: 2.17 u[IU]/mL (ref 0.450–4.500)

## 2021-07-26 LAB — T4, FREE: Free T4: 1.24 ng/dL (ref 0.82–1.77)

## 2021-07-26 NOTE — Telephone Encounter (Signed)
Critical labs called from [place] at [facility]  Glucose 106 Calcium 10.8 Alkaline Phosphate 209

## 2021-07-26 NOTE — Progress Notes (Signed)
Subjective: YI:RSWNIO swelling PCP: Janora Norlander, DO EVO:JJKKX L Sobol is a 51 y.o. female presenting to clinic today for:  1. Facial swelling Patient reports sudden onset of facial swelling last week.  She reached out to her neurosurgeon since she has a history of cranial surgery on that side for a brain tumor.  Of note her surgery was well over a month ago.  She notes that the swelling did get better with application of ice so she declined the Friday appointment that was offered.  However, over the weekend the swelling became very prevalent again.  She again used ice and the swelling has started going back down again but over the last 2 days she has noticed a 7 pound weight gain.  She feels that she is retaining fluid.  She denies any visual disturbance outside of the decreased vision on the left side due to previous tumor.  She denies any difficulty with speech, forming thoughts, mobility.  No numbness, tingling.  She does have chronic balance issues since the tumors were discovered.  She finished antibiotics about a week and a half ago for some stitches that were looking infected.  She is not running any fevers.  She has ongoing tenderness along the right side of her face but notes that this has been stable.  She also has decreased hearing on the right side but again stable due to tumor removal.   ROS: Per HPI  No Known Allergies Past Medical History:  Diagnosis Date   Cancer ([place])    Chest pain in adult 07/31/2017   Esophageal reflux 08/18/2014   Family history of brain tumor    Gallstones    Headache    Hyperlipidemia with target LDL less than 100 08/18/2015   Internal hemorrhoids 08/18/2014   PONV (postoperative nausea and vomiting)     Current Outpatient Medications:    acetaminophen (TYLENOL) 500 MG tablet, Take 1,000 mg by mouth every 8 (eight) hours as needed for moderate pain or headache., Disp: , Rfl:    atorvastatin (LIPITOR) 20 MG tablet, Take 1 tablet (20 mg  total) by mouth daily. (NEEDS TO BE SEEN BEFORE NEXT REFILL), Disp: 30 tablet, Rfl: 0   cholecalciferol (VITAMIN D3) 25 MCG (1000 UNIT) tablet, Take 1,000 Units by mouth daily., Disp: , Rfl:    doxylamine, Sleep, (UNISOM) 25 MG tablet, Take 25 mg by mouth at bedtime as needed for sleep., Disp: , Rfl:    HYDROcodone-acetaminophen (NORCO/VICODIN) 5-325 MG tablet, Take 1 tablet by mouth every 6 (six) hours as needed for moderate pain., Disp: , Rfl:    magnesium oxide (MAG-OX) 400 MG tablet, Take 400 mg by mouth daily., Disp: , Rfl:    medroxyPROGESTERone (DEPO-PROVERA) 150 MG/ML injection, Inject 1 mL (150 mg total) into the muscle every 3 (three) months., Disp: 1 mL, Rfl: 1   ondansetron (ZOFRAN) 4 MG tablet, Take 1 tablet (4 mg total) by mouth every 4 (four) hours as needed for nausea or vomiting., Disp: 20 tablet, Rfl: 0   promethazine (PHENERGAN) 25 MG tablet, Take 25 mg by mouth every 6 (six) hours as needed for nausea or vomiting., Disp: , Rfl:    Pumpkin Seed-Soy Germ (AZO BLADDER CONTROL/GO-LESS) CAPS, Take 1 capsule by mouth at bedtime.  (Patient not taking: Reported on 07/26/2021), Disp: , Rfl:   Current Facility-Administered Medications:    medroxyPROGESTERone (DEPO-PROVERA) injection 150 mg, 150 mg, Intramuscular, Q90 days, Ronnie Doss M, DO, 150 mg at 07/01/21 1415 Social History  Socioeconomic History   Marital status: Divorced    Spouse name: Not on file   Number of children: Not on file   Years of education: Not on file   Highest education level: Not on file  Occupational History   Not on file  Tobacco Use   Smoking status: Never   Smokeless tobacco: Never  Vaping Use   Vaping Use: Never used  Substance and Sexual Activity   Alcohol use: No   Drug use: No   Sexual activity: Not Currently  Other Topics Concern   Not on file  Social History Narrative   Not on file   Social Determinants of Health   Financial Resource Strain: Not on file  Food Insecurity: Not on  file  Transportation Needs: Not on file  Physical Activity: Not on file  Stress: Not on file  Social Connections: Not on file  Intimate Partner Violence: Not on file   Family History  Problem Relation Age of Onset   Hyperlipidemia Mother    Heart disease Father    Other Sister        meningiomas/tumors x11   Brain cancer Maternal Aunt        d. 60s-60s   Lung cancer Maternal Aunt    Bone cancer Maternal Aunt        d. 47s-60s   Liver cancer Paternal Uncle    Lung cancer Paternal Grandmother        d.85    Objective: Office vital signs reviewed. BP 119/74    Pulse (!) 54    Temp 97.6 F (36.4 C)    Ht 5' 5"  (1.651 m)    Wt 185 lb 6.4 oz (84.1 kg)    SpO2 95%    BMI 30.85 kg/m   Physical Examination:  General: Awake, alert, nontoxic female, No acute distress HEENT: Right-sided facial fullness appreciated.  Tenderness to palpation along the right side of the posterior jaw and neck over the area of eustachian tube.  There are some healing stitches noted along the posterior jaw on right.  No evidence of secondary infection    Neck: No masses palpated. No lymphadenopathy    Ears: Tympanic membranes intact, normal light reflex, no erythema, no bulging.  She does have 2 small papules noted along the 4 o'clock position within the external auditory canal on the right    Eyes: PERRLA, extraocular membranes intact, sclera white Cardio: regular rate and rhythm  Pulm:  normal work of breathing on room air Extremities: warm, well perfused, trace nonpitting edema, no cyanosis or clubbing; +2 pulses bilaterally MSK: Normal gait and station Neuro: Alert and oriented x3.  Speech normal  Sunday:    TODAY:     Assessment/ Plan: 51 y.o. female   Right facial swelling - Plan: Urinalysis, Routine w reflex microscopic, CBC with Differential, CMP14+EGFR, T4, Free, TSH, CANCELED: CMP14+EGFR, CANCELED: CBC with Differential, CANCELED: TSH, CANCELED: T4, Free  Fluid retention - Plan:  Urinalysis, Routine w reflex microscopic, CBC with Differential, CMP14+EGFR, T4, Free, TSH, CANCELED: CMP14+EGFR, CANCELED: CBC with Differential, CANCELED: TSH, CANCELED: T4, Free  Status post craniotomy - Plan: Urinalysis, Routine w reflex microscopic, CBC with Differential, CMP14+EGFR, T4, Free, TSH, CANCELED: CMP14+EGFR, CANCELED: CBC with Differential, CANCELED: TSH, CANCELED: T4, Free  Uncertain etiology but very concerned given history of surgical intervention for tumors.  I am running a few labs to further evaluate for any metabolic etiology given reports of peripheral swelling as well.  I have taken photographs and  we will send her chart to her neurosurgeon for further instruction.  We will collect an extra tube of blood in case he wants to add any additional labs.  Labs have been ordered for stat collection.  Hopefully we can get her into see him sooner than her appointment later this month.  She understands red flag signs and symptoms warranting further evaluation in the ER.  We will follow-up as soon as we have some results  No orders of the defined types were placed in this encounter.  No orders of the defined types were placed in this encounter.  Janora Norlander, DO [place] [phone]  a

## 2021-07-27 NOTE — Telephone Encounter (Signed)
Labs are not critical.

## 2021-07-28 ENCOUNTER — Telehealth: Payer: Self-pay | Admitting: Licensed Clinical Social Worker

## 2021-08-02 ENCOUNTER — Telehealth: Payer: Self-pay | Admitting: General Surgery

## 2021-08-02 ENCOUNTER — Encounter: Payer: Self-pay | Admitting: Licensed Clinical Social Worker

## 2021-08-02 ENCOUNTER — Ambulatory Visit: Payer: Self-pay | Admitting: Licensed Clinical Social Worker

## 2021-08-02 DIAGNOSIS — Z1509 Genetic susceptibility to other malignant neoplasm: Secondary | ICD-10-CM

## 2021-08-02 DIAGNOSIS — Z1379 Encounter for other screening for genetic and chromosomal anomalies: Secondary | ICD-10-CM

## 2021-08-02 NOTE — Telephone Encounter (Signed)
Tried to contact the patient on home number no answer. Tried her mobile and someone answered and stated I had the wrong number. Scheduled the patient for 08/22/2021 and sent a mychart message to call.

## 2021-08-02 NOTE — Telephone Encounter (Signed)
Spoke with the patient and confirmed her appointment, patient was given the address.

## 2021-08-02 NOTE — Telephone Encounter (Signed)
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  °

## 2021-08-02 NOTE — Telephone Encounter (Signed)
-----  Message from [place], DO sent at 08/02/2021  1:30 PM EST ----- Denton Ar-  Definitely an interesting history! I dont think I have seen that link either between PMS2 and menangiomas, but I will look a little further into what I can find. I would be happy to see her and discuss Lynch screening.  Thanks for sending her over!  -Azell Der, Can you please assist in setting up OV with me? Thanks.   ----- Message ----- From: Faith Rogue T Sent: 08/02/2021  11:11 AM EST To: Lavena Bullion, DO  Hi Dr. Cathleen Corti! 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 Would you/someone from your practice be able to see her for this? 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 For this reason we've recommended Dr. Vaslow/Ostergard refer her to medical genetics for potential further testing, but we know now she at least has Lynch syndrome.   From our research it doesn't seem like this result really explains her meningiomas though, but I wanted to see if you knew of anything that suggested PMS2 Lynch could cause multiple mengiomas??  Thanks so much! Denton Ar

## 2021-08-02 NOTE — Progress Notes (Signed)
Genetic Test Results  HPI:  Laurie Kelley was previously seen in the [facility] due to a personal and family history of meningiomas and concerns regarding a hereditary predisposition to cancer/NF2. Please refer to our prior cancer genetics clinic note for more information regarding our discussion, assessment and recommendations, at the time. Laurie Kelley recent genetic test results were disclosed to her, as were recommendations warranted by these results. These results and recommendations are discussed in more detail below.  CANCER HISTORY:  Oncology History   No history exists.    FAMILY HISTORY:  We obtained a detailed, 4-generation family history.  Significant diagnoses are listed below: Family History  Problem Relation Age of Onset   Hyperlipidemia Mother    Heart disease Father    Other Sister        meningiomas/tumors x11   Brain cancer Maternal Aunt        d. 22s-60s   Lung cancer Maternal Aunt    Bone cancer Maternal Aunt        d. 50s-60s   Liver cancer Paternal Uncle    Lung cancer Paternal Grandmother        d.85    Laurie Kelley has 2 daughters (81 and 22) and 1 grandson (12). She has 2 sisters and 1 brother. One of her sisters has a total of 11 brain tumors (patient believes meningiomas) that she was diagnosed with beginning in her 50s and she is now 81. She has not had surgery for these, they've been treated with radiation.    Laurie Kelley mother is living at 40 with no history of cancer. Patient has 4 maternal aunts, 4 maternal uncles. One aunt had brain and lung cancer in her 41s-60s, another aunt had bone cancer in her 69s-60s. Maternal grandmother died in her 32s, grandfather died in his 24s.   Laurie Kelley father is living at 89. Patient has 4 paternal aunts, 4 paternal uncles. One uncle died of liver cancer in his 1s. Paternal grandmother died in her 39s of lung cancer, she had history of smoking. Paternal grandfather died in his 78s.     Laurie Kelley is unaware of previous family history of genetic testing for hereditary cancer risks. Patient's maternal ancestors are of unknown descent, and paternal ancestors are of unknown descent. There is no reported Ashkenazi Jewish ancestry. There is no known consanguinity.     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  The exact breakpoints could not be determined. Chromosome microarray may be indicated to determine the size and extent of this deletion. The remainder of testing was negative/normal.   The CancerNext-Expanded + RNAinsight gene panel offered by [employer] and includes sequencing and rearrangement analysis for the following 77 genes: IP, ALK, APC*, ATM*, AXIN2, BAP1, BARD1, BLM, BMPR1A, BRCA1*, BRCA2*, BRIP1*, CDC73, CDH1*,CDK4, CDKN1B, CDKN2A, CHEK2*, CTNNA1, DICER1, FANCC, FH, FLCN, GALNT12, KIF1B, LZTR1, MAX, MEN1, MET, MLH1*, MSH2*, MSH3, MSH6*, MUTYH*, NBN, NF1*, NF2, NTHL1, PALB2*, PHOX2B, PMS2*, POT1, PRKAR1A, PTCH1, PTEN*, RAD51C*, RAD51D*,RB1, RECQL, RET, SDHA, SDHAF2, SDHB, SDHC, SDHD, SMAD4, SMARCA4, SMARCB1, SMARCE1, STK11, SUFU, TMEM127, TP53*,TSC1, TSC2, VHL and XRCC2 (sequencing and deletion/duplication); EGFR, EGLN1, HOXB13, KIT, MITF, PDGFRA, POLD1 and POLE (sequencing only); EPCAM and GREM1 (deletion/duplication only).  The test report has been scanned into EPIC and is located under the Molecular Pathology section of the Results Review tab.  A portion of the result report is included below for reference.       DISCUSSION: PMS2 (Lynch syndrome)   We discussed the cancers, inheritance, management asscociated with PMS2 and the importance of telling family members about this result.   We also discussed that this mutation is a full deletion of the gene and this test could not  determine if anything else around this gene was deleted as well. A referral to a medical geneticist is warranted to determine if further testing with chromosomal microarray is needed.   Clinical condition Lynch syndrome is characterized by an increased risk of colorectal cancer as well as cancers of the endometrium, ovary, prostate, stomach, small intestine, hepatobiliary tract, urinary tract, pancreas and brain (PMID: 2035597, [id number], [id number]). Lynch syndrome tumors typically demonstrate microsatellite instability (MSI) as well as loss of expression of the mismatch repair proteins on immunohistochemical (IHC) staining. This condition is caused by pathogenic variants in one of the mismatch repair genes--MLH1, MSH2, MSH6, PMS2--as well as deletions in the 3 end of the EPCAM gene.  Inheritance Lynch syndrome has autosomal dominant inheritance. This means that an individual with a pathogenic variant has a 50% chance of passing the condition on to their offspring. Once a pathogenic mutation is detected in an individual, it is possible to identify at-risk relatives who can pursue testing for this specific familial variant. Many cases are inherited from a parent, but some can occur spontaneously (i.e., an individual with a pathogenic variant has parents who do not have it); however, that individual now has a 50% risk of passing it on to future offspring.  Additionally, individuals with a pathogenic variant in one of the MMR genes (MLH1, MSH2, MSH6, PMS2) are carriers of constitutional mismatch repair deficiency (CMMR-D). CMMR-D is a childhood-onset cancer predisposition syndrome that can present with hematological malignancies, cancers of the brain and central nervous system, Lynch syndrome-associated cancers (colon, uterine, small bowel, urinary tract), embryonic tumors, and sarcomas. Some affected individuals may also display some features of neurofibromatosis type 1--most often cafe-au-lait macules (PMID:  22482500, [id number]). For there to be a risk of CMMR-D in offspring, an individual and their partner would each have to have a single pathogenic variant in the same MMR gene; in such a case, the risk of having an affected child is 25%.  Management:     Colorectal cancer -High-quality colonoscopy at 30-35 or 2-5 years prior to earliest colon cancer if it is diagnosed before age 55 and repeat every 1-3 years -There are data demonstrating that use of 600 mg/daily of aspirin for at least 2 years decreases CRC risk in LS, decision to use aspirin should be individualized  Endometrial cancer -PMS2 carriers appear to be at only modestly increased risk of endometrial cancer compared to other Lynch syndrome genes -Women should be educated on prompt reporting and evaluation of abnormal uterine bleeding/postmenopausal bleeding, evaluation should include endometrial biopsy -Hysterectomy has not been shown to reduce endometrial cancer mortality, but can reduce incidence of endometrial cancer. Therefore hysterectomy can be considered, timing can be individualized. -Endometrial cancer screening does not have proven benefit in women with LS, however endometrial biopsy is both highly sensitive and specific, screening via endometrial biopsy can be considered every 1-2 years at age 67-35 can be considered. -Transvaginal ultrasound to screen for endometrial cancer in postmenopausal women has not been shown to be sufficiently sensitive or specific as to support a positive recommendation, but may be considered at clinician's discretion.   Ovarian cancer -Insufficient evidence exists  to make a recommendation for RRSO for PMS2 pathogenic variant carriers -PMS2 carriers appear to be at no greater than average risk for ovarian cancer -Bilateral salpingo-oophorectomy may reduce incidence of ovarian cancer. The decision to have BSO and timing should be individualized -Since there is no effective screening for ovarian cancer,  women should be educated on symptoms that may be associated such as pelvic or abdominal pain, bloating, increased abdominal girth, difficulty eating, early satiety, or urinary frequency or urgency. Symptoms that persist for several weeks and are a change from woman's baseline should prompt evaluation by physician.  -Data do not support routine ovarian cancer screening for LS. Transvaginal ultrasound may be considered, serum CA-125 may be considered at clinician's discretion -Consider risk-reducing agents for endometrial and ovarian cancers  Urothelial cancer (Renal pelvis, ureter and/or bladder) -No clear evidence to support surveillance for urothelial cancers in LS -Surveillance may be considered in selected individuals such as family history of urothelial cancer, individuals with MSH2 mutations appear to be at higher risk -Surveillance options may include urinalysis starting at 30-35  Gastric and small bowel cancer -No clear data exist to support surveillance for gastric, duodenal and more distal small bowel cancer for LS -Gastric- risk factors include female sex, older age, MLH1/MSH2 pathogenic variants, first-degree relative with gastric cancer, Asian ethnicity, residing in or immigrant from countries with high background incidence of gastric cancer, chronic autoimmune gastritis, gastric intestinal metaplasia, and gastric adenomas -Consider baseline EGD with random biopsy of the proximal and distal stomach for H. Pylori, autoimmune gastritis, and intestinal metaplasia beginning at age 1 y and surveillance EGD every 3-5 years in those with above risk factors  Pancreatic cancer -PMS2 carriers have not been shown to be at increased risk for pancreatic tumors -Consider pancreatic cancer screening beginning at 68 (or 10 years younger than earliest exocrine pancreatic cancer diagnosis in the family, whichever is earlier) for individuals with exocrine pancreatic cancer in >1 first or second degree  relative from the same side of the family as the identified germline variant.   Prostate cancer -Insufficient evidence to recommend earlier or more frequent prostate cancer screening   Brain cancer -Patients should be educated regarding signs and symptoms of neurologic cancer and the importance of prompt reporting of abnormal symptoms to their physicians  These guidelines are based on current NCCN guidelines (NCCN v.1.2022).  These guidelines are subject to change and continually updated and should be directly referenced for future medical management.     An individuals cancer risk and medical management are not determined by genetic test results alone. Overall cancer risk assessment incorporates additional factors, including personal medical history, family history, and any available genetic information that may result in a personalized plan for cancer prevention and surveillance.  Knowing if a pathogenic PMS2 variant is present is advantageous. At-risk relatives can be identified, enabling pursuit of a diagnostic evaluation. Information regarding hereditary cancer susceptibility genes is constantly evolving, and more clinically relevant data regarding  PMS2 is likely to become available in the near future. Awareness of this cancer predisposition encourages patients and their providers to inform at-risk family members, to diligently follow condition-specific screening protocols, and to be vigilant in maintaining close and regular contact with their local genetics clinic in anticipation of new information.  NF2  Laurie Kelley's genetic test did not identify a mutation in NF2. However she may also need to see medical genetics to fully rule out an NF2 diagnosis. NF2 does have clinical diagnostic criteria (Baser et al. 2002),  which a medical geneticist could also assess this patient for. Criteria listed below.  NF2 is diagnosed in individuals with ONE of the following: Bilateral vestibular  schwannomas A first degree relative with NF2 AND unilateral vestibular schwannaom OR ANY TWO of the following  Meningioma, schwannoma, glioma, neurofibroma, cataract in the form of posterior subcapsular lenticular opacities or cortical wedge cataract Unilateral vestibular schwannaoma AND ANY TWO of the following: meningoma, schwannoma, glioma, neurofibroma, cataract in the form of posterior subcapsular lenticular opacities or cortical wedge cataract Multiple meningiomas AND Unilateral vestibular schwannoma OR ANY TWO of the following: schwannoma, glioma, neurofibroma, cataract in the form of posterior subcapsular lenticular opacities or cortical wedge cataract  Additionally, molecular genetic testing does not identify all cases of NF2:   Somatic mosaicism is frequent in NF2. As many as 25-33% of individuals with a de novo pathogenic NF2 variant have somatic mosaicism for the variant. Such individuals may have normal molecular genetic testing of NF2 in unaffected tissue such as lymphocytes; thus molecular genetic testing of tumor tissue may be necessary to establish presence of somatic mosaicism. (GeneReviews).   FAMILY MEMBERS: It is important that all of Laurie Kelley relatives (both men and women) know of the presence of this gene mutation. Site-specific genetic testing can sort out who in the family is at risk and who is not.   Laurie Kelley children and siblings have a 50% chance to have inherited this mutation. We recommend they have genetic testing for this same mutation, as identifying the presence of this mutation would allow them to also take advantage of risk-reducing measures.   PLAN:   Laurie Kelley will need to be followed as high risk based on her diagnosis of Lynch syndrome.    Laurie Kelley does not have a gastroenterologist to perform follow up for the diagnosis of Lynch syndrome.  We have suggested that she be seen at [facility].  This note will be copied to that practice in  order to set up the appropriate follow up.  Additionally, a referral to medical genetics is warranted given this test result to determine if further testing is necessary. Dr. Retta Mac, a [facility] pediatric geneticist ([email or web address]), has agreed to see Laurie Kelley but will require a referral from one of her providers.   Laurie Kelley plans to discuss these results with family and will reach out to [place] if we can be of any assistance in coordinating genetic testing for any of relatives.    SUPPORT AND RESOURCES: Laurie Kelley was given information about [employer], a lynch syndrome hereditary cancer advocacy organization: [email or web address]  We encouraged Laurie Kelley to remain in contact with [place] on an annual basis so we can update her personal and family histories, and let her know of advances in cancer genetics that may benefit the family. Our contact number was provided. Laurie Kelley questions were answered to her satisfaction today, and she knows she is welcome to call anytime with additional questions.   Faith Rogue, MS, [facility] Genetic Counselor [email or web address] Phone: [phone]

## 2021-08-22 ENCOUNTER — Other Ambulatory Visit: Payer: Self-pay

## 2021-08-22 ENCOUNTER — Encounter: Payer: Self-pay | Admitting: Gastroenterology

## 2021-08-22 ENCOUNTER — Ambulatory Visit (INDEPENDENT_AMBULATORY_CARE_PROVIDER_SITE_OTHER): Payer: Commercial Managed Care - PPO | Admitting: Gastroenterology

## 2021-08-22 VITALS — BP 128/90 | HR 78 | Ht 65.0 in | Wt 184.2 lb

## 2021-08-22 DIAGNOSIS — Z1211 Encounter for screening for malignant neoplasm of colon: Secondary | ICD-10-CM | POA: Diagnosis not present

## 2021-08-22 DIAGNOSIS — Z1509 Genetic susceptibility to other malignant neoplasm: Secondary | ICD-10-CM | POA: Diagnosis not present

## 2021-08-22 DIAGNOSIS — D496 Neoplasm of unspecified behavior of brain: Secondary | ICD-10-CM | POA: Diagnosis not present

## 2021-08-22 DIAGNOSIS — Z8489 Family history of other specified conditions: Secondary | ICD-10-CM | POA: Diagnosis not present

## 2021-08-22 DIAGNOSIS — Z1212 Encounter for screening for malignant neoplasm of rectum: Secondary | ICD-10-CM

## 2021-08-22 NOTE — Patient Instructions (Signed)
If you are age 49 or older, your body mass index should be between 23-30. Your Body mass index is 30.66 kg/m?Marland Kitchen If this is out of the aforementioned range listed, please consider follow up with your Primary Care Provider. ? ?If you are age 69 or younger, your body mass index should be between 19-25. Your Body mass index is 30.66 kg/m?Marland Kitchen If this is out of the aformentioned range listed, please consider follow up with your Primary Care Provider.  ? ?________________________________________________________ ? ?The [facility] providers would like to encourage you to use [facility] to communicate with providers for non-urgent requests or questions.  Due to long hold times on the telephone, sending your provider a message by [facility] may be a faster and more efficient way to get a response.  Please allow 48 business hours for a response.  Please remember that this is for non-urgent requests.  ?_______________________________________________________ ? ?It has been recommended to you by your physician that you have a(n) EGD/Colon completed. Please contact our office at [phone]  in 6 months to see about scheduling this. You will be scheduled for a pre-visit and procedure at that time.  ? ?It was a pleasure to see you today! ? ?Gerrit Heck, D.O. ? ?

## 2021-08-22 NOTE — Progress Notes (Signed)
? ? ?          ?Chief Complaint: Lynch Syndrome, abnormal genetic testing ? ?Referring Provider:     Faith Rogue [facility]) ? ? ? ?HPI:   ? ? ?Laurie Kelley is a 51 y.o. female referred to the Gastroenterology Clinic for evaluation of Lynch syndrome. ? ?Was initially referred to the [facility] by Dr. Zada Finders due to personal and family history of meningiomas.  She was diagnosed with bilateral skull base meningiomas in 01/2021, now s/p surgery 02/18/2021 and 05/18/2021. 3 that are unresectable.  Planning to eventually start radiation, but she is unsure of the timing. ? ?Had repeat brain MRI in Feb which was n/f clot per patient. Not available in EMR. Has since f/u with Dr. Zada Finders. Started on ASA 81 mg/day x3 months.  ? ?Family history notable for the following: ?- Sister with 11 separate meningiomas (all treated with radiation, no surgery) ?- Maternal aunt with brain and lung cancer ?- Maternal aunt with bone cancer ?- Father with prostate cancer ?- Paternal uncle with liver cancer ?- Paternal grandmother with lung cancer ? 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 ? ?No previous EGD or colonoscopy. ? ?She is otherwise without any GI symptoms.  Of note, mouth does not open fully wide just yet after surgery.  Is able to open wide enough to at least accommodate pediatric bite block though. ? ?Past Medical History:  ?Diagnosis Date  ? Cancer [facility])   ? Chest pain in adult 07/31/2017  ? Esophageal reflux 08/18/2014  ? Family history of brain tumor   ? Gallstones   ? Headache   ? Hyperlipidemia with target LDL less than 100 08/18/2015  ? Internal hemorrhoids 08/18/2014  ? PONV (postoperative nausea and vomiting)   ? ? ? ?Past Surgical History:  ?Procedure Laterality Date  ? APPLICATION OF CRANIAL NAVIGATION Left 02/18/2021  ? Procedure: APPLICATION OF CRANIAL NAVIGATION;  Surgeon: Judith Part, MD;  Location: [place];  Service: Neurosurgery;  Laterality: Left;  ? APPLICATION OF CRANIAL NAVIGATION N/A 05/18/2021  ? Procedure: APPLICATION OF CRANIAL NAVIGATION;  Surgeon: Judith Part, MD;  Location: [place];  Service: Neurosurgery;  Laterality: N/A;  ? BRAIN SURGERY    ? CHOLECYSTECTOMY  1993  ? CRANIOTOMY Left 02/18/2021  ? Procedure: Left orbitozygomatic craniotomy for tumor resection  with Brain Lab;  Surgeon: Judith Part, MD;  Location: [place];  Service: Neurosurgery;  Laterality: Left;  ? CRANIOTOMY Right 05/18/2021  ? Procedure: Right Orbitozygomatic craniotomy for tumor with brainlab;  Surgeon: Judith Part, MD;  Location: [place];  Service: Neurosurgery;  Laterality: Right;  ? SHOULDER ARTHROSCOPY Right 11/06/2019  ? Procedure: ARTHROSCOPY RIGHT SHOULDER, DEBRIDEMENT X TWO STRUCTURES; OPEN ROTATOR CUFF REPAIR;  Surgeon: Carole Civil, MD;  Location: [facility];  Service: Orthopedics;  Laterality: Right;  ? ?Family History  ?Problem Relation Age of Onset  ? Hyperlipidemia Mother   ? Heart disease Father   ? Other Sister   ?     meningiomas/tumors x11  ? Lung cancer Paternal Grandmother   ?     d.85  ? Brain cancer Maternal Aunt   ?     d. 31s-60s  ? Lung cancer Maternal Aunt   ? Bone cancer Maternal Aunt   ?     d. 26s-60s  ? Liver cancer Paternal Uncle   ? Colon cancer Neg Hx   ?  Rectal cancer Neg Hx   ? Esophageal cancer Neg Hx   ? Stomach cancer Neg Hx   ? ?Social History  ? ?Tobacco Use  ? Smoking status: Never  ? Smokeless tobacco: Never  ?Vaping Use  ? Vaping Use: Never used  ?Substance Use Topics  ? Alcohol use: No  ? Drug use: No  ? ?Current Outpatient Medications  ?Medication Sig Dispense Refill  ? acetaminophen (TYLENOL) 500 MG tablet Take 1,000 mg by mouth every 8 (eight) hours as needed for moderate pain or headache.    ? atorvastatin (LIPITOR) 20 MG tablet Take 1 tablet (20 mg total) by mouth daily. (NEEDS TO BE SEEN BEFORE NEXT REFILL) 30 tablet 0  ? cholecalciferol (VITAMIN D3) 25 MCG  (1000 UNIT) tablet Take 1,000 Units by mouth daily.    ? doxylamine, Sleep, (UNISOM) 25 MG tablet Take 25 mg by mouth at bedtime as needed for sleep.    ? HYDROcodone-acetaminophen (NORCO/VICODIN) 5-325 MG tablet Take 1 tablet by mouth every 6 (six) hours as needed for moderate pain.    ? magnesium oxide (MAG-OX) 400 MG tablet Take 400 mg by mouth daily.    ? medroxyPROGESTERone (DEPO-PROVERA) 150 MG/ML injection Inject 1 mL (150 mg total) into the muscle every 3 (three) months. 1 mL 1  ? promethazine (PHENERGAN) 25 MG tablet Take 25 mg by mouth every 6 (six) hours as needed for nausea or vomiting.    ? Pumpkin Seed-Soy Germ (AZO BLADDER CONTROL/GO-LESS) CAPS Take 1 capsule by mouth at bedtime.    ? ?Current Facility-Administered Medications  ?Medication Dose Route Frequency Provider Last Rate Last Admin  ? medroxyPROGESTERone (DEPO-PROVERA) injection 150 mg  150 mg Intramuscular Q90 days Ronnie Doss M, DO   150 mg at 07/01/21 1415  ? ?Allergies  ?Allergen Reactions  ? Saline Nausea And Vomiting  ? ? ? ?Review of Systems: ?All systems reviewed and negative except where noted in HPI.  ? ? ? ?Physical Exam:   ? ?Wt Readings from Last 3 Encounters:  ?08/22/21 184 lb 4 oz (83.6 kg)  ?07/26/21 185 lb 6.4 oz (84.1 kg)  ?05/18/21 181 lb (82.1 kg)  ? ? ?BP 128/90   Pulse 78   Ht 5' 5"  (1.651 m)   Wt 184 lb 4 oz (83.6 kg)   SpO2 100%   BMI 30.66 kg/m?  ?Constitutional:  Pleasant, in no acute distress. ?Psychiatric: Normal mood and affect. Behavior is normal. ?Neurological: Alert and oriented to person place and time. ?Skin: Skin is warm and dry. No rashes noted. ? ? ?ASSESSMENT AND PLAN;  ? ?1) Lynch Syndrome ?2) PMS2 deletion ?3) Meningiomas ?4) Family history of brain tumor ? ?We discussed Lynch syndrome with regards to GI malignancy risk at length today.  Does have several cancers in the family, but no known family history of esophageal, gastric, small intestine, colon, or pancreatic cancer.  Did have paternal  uncle with liver cancer.  Discussed screening at length with plan for the following: ? ?- Start colonoscopy now and repeat every 1-2 years ?- While there are data to support using aspirin 325 mg/day to potentially reduce CRC risk, hesitant to start this given her meningioma history.  Instead continue with ASA 81 mg/day as recently prescribed by her Neurosurgeon ?- Data are less clear with regards to gastric and small bowel cancer screening, but reasonable data suggest starting with screening EGD and repeating every 2-4 years.  Plan to perform random and directed gastric biopsies to rule out  H. pylori, autoimmune gastritis, and intestinal metaplasia ?- In individuals without family history of small bowel cancer, do not need specific small bowel imaging (i.e. CT enterography, VCE, etc.) ?- No family history of Pancreatic Cancer and PMS2 does not carry increased risk for PC.  No specific PC screening needed ? ?With regards to timing, she just had her second surgery in December, and reported clot last month on MRI brain.  As she is otherwise asymptomatic from a GI standpoint, so I do not feel that EGD/colonoscopy needs to completed in the immediate setting.  Instead tentative plan for procedures in 6 months pending Neurosurgery clearance at that time ? ?- Zofran pre-medicate with prep ?- Zofran with sedation as well ?- EGD/Colo in 6 months ?- Will get Neurosurgery clearance at that time ?- Make appt in GYN clinic ? ? ?Lavena Bullion, DO, FACG  08/22/2021, 3:43 PM ? ? ?Janora Norlander, DO ? ?

## 2021-09-15 ENCOUNTER — Other Ambulatory Visit: Payer: Self-pay | Admitting: Neurological Surgery

## 2021-09-15 DIAGNOSIS — D320 Benign neoplasm of cerebral meninges: Secondary | ICD-10-CM

## 2021-09-19 ENCOUNTER — Ambulatory Visit: Payer: Commercial Managed Care - PPO | Admitting: *Deleted

## 2021-09-19 DIAGNOSIS — Z3042 Encounter for surveillance of injectable contraceptive: Secondary | ICD-10-CM

## 2021-09-19 MED ORDER — ATORVASTATIN CALCIUM 20 MG PO TABS: 20.0000 mg | ORAL_TABLET | Freq: Every day | ORAL | 0 refills | Status: DC

## 2021-09-19 NOTE — Progress Notes (Signed)
Depo Provera given and patient tolerated well. Patient scheduled for physical on 06/20.  ?

## 2021-09-23 DIAGNOSIS — H6521 Chronic serous otitis media, right ear: Secondary | ICD-10-CM | POA: Insufficient documentation

## 2021-09-23 DIAGNOSIS — H6993 Unspecified Eustachian tube disorder, bilateral: Secondary | ICD-10-CM | POA: Insufficient documentation

## 2021-09-23 DIAGNOSIS — H900 Conductive hearing loss, bilateral: Secondary | ICD-10-CM | POA: Insufficient documentation

## 2021-09-28 ENCOUNTER — Other Ambulatory Visit: Payer: Self-pay | Admitting: Family Medicine

## 2021-09-28 DIAGNOSIS — Z1231 Encounter for screening mammogram for malignant neoplasm of breast: Secondary | ICD-10-CM

## 2021-09-30 ENCOUNTER — Ambulatory Visit
Admission: RE | Admit: 2021-09-30 | Discharge: 2021-09-30 | Disposition: A | Discharge status: Home / Self Care | Payer: Commercial Managed Care - PPO | Source: Ambulatory Visit | Attending: Neurological Surgery | Admitting: Neurological Surgery

## 2021-09-30 DIAGNOSIS — D320 Benign neoplasm of cerebral meninges: Secondary | ICD-10-CM

## 2021-10-10 ENCOUNTER — Ambulatory Visit
Admission: RE | Admit: 2021-10-10 | Discharge: 2021-10-10 | Disposition: A | Discharge status: Home / Self Care | Payer: Commercial Managed Care - PPO | Source: Ambulatory Visit | Attending: Family Medicine | Admitting: Family Medicine

## 2021-10-10 DIAGNOSIS — Z1231 Encounter for screening mammogram for malignant neoplasm of breast: Secondary | ICD-10-CM

## 2021-10-11 ENCOUNTER — Other Ambulatory Visit: Payer: Self-pay | Admitting: Radiation Therapy

## 2021-11-11 ENCOUNTER — Other Ambulatory Visit (HOSPITAL_COMMUNITY): Payer: Self-pay | Admitting: Neurological Surgery

## 2021-11-11 ENCOUNTER — Other Ambulatory Visit: Payer: Self-pay | Admitting: Neurological Surgery

## 2021-11-11 DIAGNOSIS — D320 Benign neoplasm of cerebral meninges: Secondary | ICD-10-CM

## 2021-11-22 ENCOUNTER — Ambulatory Visit (HOSPITAL_COMMUNITY)
Admission: RE | Admit: 2021-11-22 | Discharge: 2021-11-22 | Disposition: A | Discharge status: Home / Self Care | Payer: Commercial Managed Care - PPO | Source: Ambulatory Visit | Attending: Neurological Surgery | Admitting: Neurological Surgery

## 2021-11-22 DIAGNOSIS — D320 Benign neoplasm of cerebral meninges: Secondary | ICD-10-CM | POA: Insufficient documentation

## 2021-11-22 MED ORDER — GADOBUTROL 1 MMOL/ML IV SOLN
9.0000 mL | Freq: Once | INTRAVENOUS | Status: AC | PRN
  Administered 2021-11-22: 9 mL via INTRAVENOUS

## 2021-11-30 ENCOUNTER — Other Ambulatory Visit: Payer: Self-pay | Admitting: Radiation Therapy

## 2021-12-05 ENCOUNTER — Inpatient Hospital Stay: Payer: Commercial Managed Care - PPO | Attending: Internal Medicine

## 2021-12-05 ENCOUNTER — Telehealth: Payer: Self-pay | Admitting: Radiation Therapy

## 2021-12-05 ENCOUNTER — Other Ambulatory Visit: Payer: Self-pay | Admitting: Radiation Therapy

## 2021-12-05 DIAGNOSIS — D329 Benign neoplasm of meninges, unspecified: Secondary | ICD-10-CM

## 2021-12-05 NOTE — Telephone Encounter (Signed)
Spoke with patient about her consultation with Dr. Tammi Klippel on 6/27.   Laurie Kelley

## 2021-12-06 ENCOUNTER — Ambulatory Visit (INDEPENDENT_AMBULATORY_CARE_PROVIDER_SITE_OTHER): Payer: Commercial Managed Care - PPO | Admitting: Family Medicine

## 2021-12-06 ENCOUNTER — Other Ambulatory Visit (HOSPITAL_COMMUNITY)
Admission: RE | Admit: 2021-12-06 | Discharge: 2021-12-06 | Disposition: A | Discharge status: Home / Self Care | Payer: Commercial Managed Care - PPO | Source: Ambulatory Visit | Attending: Family Medicine | Admitting: Family Medicine

## 2021-12-06 ENCOUNTER — Ambulatory Visit (INDEPENDENT_AMBULATORY_CARE_PROVIDER_SITE_OTHER): Payer: Commercial Managed Care - PPO

## 2021-12-06 ENCOUNTER — Encounter: Payer: Self-pay | Admitting: Family Medicine

## 2021-12-06 VITALS — BP 136/82 | HR 102 | Temp 97.9°F | Ht 65.0 in | Wt 178.0 lb

## 2021-12-06 DIAGNOSIS — E785 Hyperlipidemia, unspecified: Secondary | ICD-10-CM

## 2021-12-06 DIAGNOSIS — Z0001 Encounter for general adult medical examination with abnormal findings: Secondary | ICD-10-CM | POA: Diagnosis not present

## 2021-12-06 DIAGNOSIS — Z0000 Encounter for general adult medical examination without abnormal findings: Secondary | ICD-10-CM

## 2021-12-06 DIAGNOSIS — Z1509 Genetic susceptibility to other malignant neoplasm: Secondary | ICD-10-CM

## 2021-12-06 DIAGNOSIS — M8588 Other specified disorders of bone density and structure, other site: Secondary | ICD-10-CM

## 2021-12-06 DIAGNOSIS — Z124 Encounter for screening for malignant neoplasm of cervix: Secondary | ICD-10-CM | POA: Insufficient documentation

## 2021-12-06 DIAGNOSIS — Z6830 Body mass index (BMI) 30.0-30.9, adult: Secondary | ICD-10-CM

## 2021-12-06 DIAGNOSIS — M85851 Other specified disorders of bone density and structure, right thigh: Secondary | ICD-10-CM

## 2021-12-06 DIAGNOSIS — Z9889 Other specified postprocedural states: Secondary | ICD-10-CM

## 2021-12-06 DIAGNOSIS — R3121 Asymptomatic microscopic hematuria: Secondary | ICD-10-CM

## 2021-12-06 DIAGNOSIS — Z15060 Genetic susceptibility to colorectal cancer: Secondary | ICD-10-CM

## 2021-12-06 LAB — URINALYSIS, ROUTINE W REFLEX MICROSCOPIC
Bilirubin, UA: NEGATIVE
Glucose, UA: NEGATIVE
Ketones, UA: NEGATIVE
Leukocytes,UA: NEGATIVE
Nitrite, UA: NEGATIVE
Protein,UA: NEGATIVE
Specific Gravity, UA: 1.02 (ref 1.005–1.030)
Urobilinogen, Ur: 0.2 mg/dL (ref 0.2–1.0)
pH, UA: 6 (ref 5.0–7.5)

## 2021-12-06 LAB — MICROSCOPIC EXAMINATION: Bacteria, UA: NONE SEEN

## 2021-12-06 LAB — URINALYSIS
Bilirubin, UA: NEGATIVE
Glucose, UA: NEGATIVE
Ketones, UA: NEGATIVE
Leukocytes,UA: NEGATIVE
Nitrite, UA: NEGATIVE
Protein,UA: NEGATIVE
Specific Gravity, UA: 1.02 (ref 1.005–1.030)
Urobilinogen, Ur: 0.2 mg/dL (ref 0.2–1.0)
pH, UA: 6 (ref 5.0–7.5)

## 2021-12-06 LAB — BAYER DCA HB A1C WAIVED: HB A1C (BAYER DCA - WAIVED): 5.3 % (ref 4.8–5.6)

## 2021-12-06 MED ORDER — ATORVASTATIN CALCIUM 20 MG PO TABS
20.0000 mg | ORAL_TABLET | Freq: Every day | ORAL | 3 refills | Status: DC
Start: 2021-12-06 — End: 2023-01-01

## 2021-12-06 NOTE — Patient Instructions (Addendum)
Due for colon cancer screening. Referral to Ob.GYN placed. They may offer endometrial biopsy given Lynch Syndrome diagnosis but the referral was placed for IUD insertion.  Intrauterine Device Insertion An intrauterine device (IUD) is a medical device that is inserted into the uterus to prevent pregnancy. It is a small, T-shaped device that has one or two nylon strings hanging down from it. The strings hang out of the lower part of the uterus (cervix) to allow for future IUD removal. There are two types of IUDs: Hormone IUD. This type of IUD is made of plastic and contains the hormone progestin (synthetic progesterone). A hormone IUD may last 3-5 years, depending on which one you have. Synthetic progesterone prevents pregnancy by: Thickening cervical mucus to prevent sperm from entering the uterus. Thinning the uterine lining to prevent a fertilized egg from implanting there. Copper IUD. This type of IUD has copper wire wrapped around it. A copper IUD may last up to 10 years. Copper prevents pregnancy by making the uterus and fallopian tubes produce a fluid that kills sperm. Tell a health care provider about: Any allergies you have. All medicines you are taking, including vitamins, herbs, eye drops, creams, and over-the-counter medicines. Any surgeries you have had. Any medical conditions you have, including any sexually transmitted infections (STIs) you may have. Whether you are pregnant or may be pregnant. What are the risks? Generally, this is a safe procedure. However, problems may occur, including: Infection. Bleeding. Allergic reactions to medicines. Puncture (perforation) of the uterus or damage to other structures or organs. Accidental placement of the IUD either in the muscle layer of the uterus (myometrium) or outside the uterus. The IUD falling out of the uterus (expulsion). This is more common among women who have recently had a child. Higher risk of an egg being fertilized outside  your uterus (ectopic pregnancy).This is rare. Pelvic inflammatory disease (PID), which is an infection in the uterus and fallopian tubes. The IUD does not cause the infection. The infection is usually from an unknown sexually transmitted infection (STI). This is rare, and it usually happens during the first 20 days after the IUD is inserted. What happens before the procedure? Ask your health care provider about: Changing or stopping your regular medicines. This is especially important if you are taking diabetes medicines or blood thinners. Taking over-the-counter medicines, vitamins, herbs, and supplements. Talk with your health care provider about when to schedule your IUD placement. Your health care provider may recommend taking over-the-counter pain medicines before the procedure. These medicines include ibuprofen and naproxen. You may have tests for: Pregnancy. A pregnancy test involves having a urine or blood sample taken. Sexually transmitted infections (STIs). Placing an IUD in someone who has an STI can make the infection worse. Cervical cancer. You may have a Pap test to check for this type of cancer. This means collecting cells from your cervix to be checked under a microscope. You may have a physical exam to determine the size and position of your uterus. What happens during the procedure? A tool (speculum) will be placed in your vagina and widened so that your health care provider can see your cervix. Medicine, or antiseptic, may be applied to your cervix to help lower your risk of infection. You may be given an anesthetic medicine to numb each side of your cervix. This medicine is usually given by an injection into the cervix. A tool called a uterine sound will be inserted into your uterus to check the length of your  uterus and the direction that your uterus may be tilted. A slim instrument or tube (IUD inserter) that holds the IUD will be inserted into your vagina, through your  cervical canal, and into your uterus. The IUD will be placed in the uterus, and the IUD inserter will be removed. The strings that are attached to the IUD will be trimmed so that they lie just below the cervix. The speculum will be removed. The procedure may vary among health care providers and hospitals. What can I expect after procedure? You may have bleeding after the procedure. This is normal. It varies from light bleeding (spotting) for a few days to menstrual-like bleeding. You may have cramping and pain in the abdomen. You may feel dizzy or light-headed. You may have lower back pain. You may have headaches and nausea. Follow these instructions at home: Before resuming sexual activity, check to make sure that you can feel the IUD string or strings. You should be able to feel the end of the string below the opening of your cervix. If your IUD string is in place, you may resume sexual activity. If you had a hormonal IUD inserted more than 7 days after your most recent period started, you will need to use a backup method of birth control for 7 days after IUD insertion. Ask your health care provider whether this applies to you. Continue to check that the IUD is still in place by feeling for the strings after every menstrual period, or once a month. An IUD will not protect you from sexually transmitted infections (STIs). Use methods to prevent the exchange of body fluids between partners (barrier protection) every time you have sex. Barrier protection can be used during oral, vaginal, or anal sex. Commonly used barrier methods include: Female condom. Female condom. Dental dam. Take over-the-counter and prescription medicines only as told by your health care provider. Keep all follow-up visits. This is important. Contact a health care provider if: You feel light-headed or weak. You have any of the following problems with your IUD string or strings: The string bothers or hurts you or your sexual  partner. You cannot feel the string. The string has gotten longer. You can feel the IUD in your vagina. You think you may be pregnant, or you miss your menstrual period. You think you may have a sexually transmitted infection (STI). Get help right away if you: You have flu-like symptoms, such as tiredness (fatigue) and muscle aches. You have a fever and chills. You have bleeding that is heavier or lasts longer than a normal menstrual cycle. You have abnormal or bad-smelling discharge from your vagina. You develop abdominal pain that is new, is getting worse, or is not in the same area of earlier cramping and pain. You have pain during sexual activity. Summary An intrauterine device (IUD) is a small, T-shaped device that has one or two nylon strings hanging down from it. You may have a copper IUD or a hormone IUD. Ask your health care provider what you need to do before the procedure. You may have some tests and you may have to change or stop some medicines. You may have bleeding after the procedure. This is normal. It varies from light spotting for a few days to menstrual-like bleeding. Check to make sure that you can feel the IUD strings before you resume sexual activity. Check the strings after every menstrual period or once a month. An IUD does not protect against STIs. Use other methods to protect yourself  against infections. This information is not intended to replace advice given to you by your health care provider. Make sure you discuss any questions you have with your health care provider. Document Revised: 12/17/2019 Document Reviewed: 12/17/2019 Elsevier Patient Education  [employer].

## 2021-12-06 NOTE — Progress Notes (Signed)
Laurie Kelley is a 51 y.o. female presents to office today for annual physical exam examination.    Concerns today include: 1.  Lynch syndrome Patient recently diagnosed with Lynch syndrome.  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  She has been to be seeing the ENT soon to discuss what next steps are.  She notes that her gastric specialist were recommended that this be mentioned to me today since we are performing a Pap smear.  She is not currently scheduled with an OB/GYN but that was their recommendation given this new diagnosis.  She is chronically treated with Depo Provera and is due for her shot today.  She brings this to the office.  She apparently has been on this for well over 25 years because she "does not want to have a period".  She has had DEXA scan performed in the past with the most recent in December 2019.  This showed osteopenia.  She notes that nobody has ever sat down to talk to her about the repercussions of long-term use of this particular medication but she is certainly amenable to alternatives if it would mean that it would keep her menstrual cycle at bay.  Amenable to referral to OB/GYN for further evaluation given the Lynch syndrome but needs female.  Occupation: out of work due to [employer] issues, Marital status: not married/ not sexually active, Substance use: none Diet: fair, Exercise: no structured Last eye exam: UTD Last colonoscopy: Planned for this fall Last mammogram: UTD Last pap smear: needs Refills needed today: lipitor. Immunizations needed: Immunization History  Administered Date(s) Administered   Hepatitis B 05/11/2004, 06/09/2004   Hepatitis B, ped/adol 05/11/2004, 06/09/2004   Influenza,inj,Quad PF,6+ Mos 08/17/2016,  03/28/2018, 02/21/2019, 04/13/2020   Moderna SARS-COV2 Booster Vaccination 05/19/2020   Moderna Sars-Covid-2 Vaccination 07/04/2019, 08/06/2019, 05/19/2020   Tdap 08/11/2014     Past Medical History:  Diagnosis Date   Cancer ([place])    Chest pain in adult 07/31/2017   Esophageal reflux 08/18/2014   Family history of brain tumor    Gallstones    Headache    Hyperlipidemia with target LDL less than 100 08/18/2015   Internal hemorrhoids 08/18/2014   PONV (postoperative nausea and vomiting)    Social History   Socioeconomic History   Marital status: Divorced    Spouse name: Not on file   Number of children: 2   Years of education: Not on file   Highest education level: Not on file  Occupational History   Not on file  Tobacco Use   Smoking status: Never   Smokeless tobacco: Never  Vaping Use   Vaping Use: Never used  Substance and Sexual Activity   Alcohol use: No   Drug use: No   Sexual activity: Not Currently  Other Topics Concern   Not on file  Social History Narrative   Not on file   Social Determinants of Health   Financial Resource Strain: Not on file  Food Insecurity: Not on file  Transportation Needs: Not on file  Physical Activity: Not on file  Stress: Not on file  Social Connections: Not on file  Intimate Partner Violence: Not on file   Past Surgical History:  Procedure Laterality Date   APPLICATION OF CRANIAL NAVIGATION  Left 02/18/2021   Procedure: APPLICATION OF CRANIAL NAVIGATION;  Surgeon: Judith Part, MD;  Location: [facility];  Service: Neurosurgery;  Laterality: Left;   APPLICATION OF CRANIAL NAVIGATION N/A 05/18/2021   Procedure: APPLICATION OF CRANIAL NAVIGATION;  Surgeon: Judith Part, MD;  Location: [facility];  Service: Neurosurgery;  Laterality: N/A;   BRAIN SURGERY     CHOLECYSTECTOMY  1993   CRANIOTOMY Left 02/18/2021   Procedure: Left orbitozygomatic craniotomy for tumor resection  with Brain Lab;  Surgeon: Judith Part, MD;   Location: [facility];  Service: Neurosurgery;  Laterality: Left;   CRANIOTOMY Right 05/18/2021   Procedure: Right Orbitozygomatic craniotomy for tumor with brainlab;  Surgeon: Judith Part, MD;  Location: [facility];  Service: Neurosurgery;  Laterality: Right;   SHOULDER ARTHROSCOPY Right 11/06/2019   Procedure: ARTHROSCOPY RIGHT SHOULDER, DEBRIDEMENT X TWO STRUCTURES; OPEN ROTATOR CUFF REPAIR;  Surgeon: Carole Civil, MD;  Location: [facility];  Service: Orthopedics;  Laterality: Right;   Family History  Problem Relation Age of Onset   Hyperlipidemia Mother    Heart disease Father    Other Sister        meningiomas/tumors x11   Brain cancer Maternal Aunt        d. 53s-60s   Lung cancer Maternal Aunt    Bone cancer Maternal Aunt        d. 50s-60s   Liver cancer Paternal Uncle    Lung cancer Paternal Grandmother        d.85   Colon cancer Neg Hx    Rectal cancer Neg Hx    Esophageal cancer Neg Hx    Stomach cancer Neg Hx    Breast cancer Neg Hx     Current Outpatient Medications:    acetaminophen (TYLENOL) 500 MG tablet, Take 1,000 mg by mouth every 8 (eight) hours as needed for moderate pain or headache., Disp: , Rfl:    atorvastatin (LIPITOR) 20 MG tablet, Take 1 tablet (20 mg total) by mouth daily. (NEEDS TO BE SEEN BEFORE NEXT REFILL), Disp: 90 tablet, Rfl: 0   cholecalciferol (VITAMIN D3) 25 MCG (1000 UNIT) tablet, Take 1,000 Units by mouth daily., Disp: , Rfl:    doxylamine, Sleep, (UNISOM) 25 MG tablet, Take 25 mg by mouth at bedtime as needed for sleep., Disp: , Rfl:    HYDROcodone-acetaminophen (NORCO/VICODIN) 5-325 MG tablet, Take 1 tablet by mouth every 6 (six) hours as needed for moderate pain., Disp: , Rfl:    magnesium oxide (MAG-OX) 400 MG tablet, Take 400 mg by mouth daily., Disp: , Rfl:    medroxyPROGESTERone (DEPO-PROVERA) 150 MG/ML injection, Inject 1 mL (150 mg total) into the muscle every 3 (three) months., Disp: 1 mL, Rfl: 1   promethazine (PHENERGAN) 25 MG  tablet, Take 25 mg by mouth every 6 (six) hours as needed for nausea or vomiting., Disp: , Rfl:    Pumpkin Seed-Soy Germ (AZO BLADDER CONTROL/GO-LESS) CAPS, Take 1 capsule by mouth at bedtime., Disp: , Rfl:   Current Facility-Administered Medications:    medroxyPROGESTERone (DEPO-PROVERA) injection 150 mg, 150 mg, Intramuscular, Q90 days, Andria Head M, DO, 150 mg at 07/01/21 1415  Allergies  Allergen Reactions   Saline Nausea And Vomiting     ROS: Review of Systems Pertinent items are noted in HPI.    Physical exam BP 136/82   Pulse (!) 102   Temp 97.9 F (36.6 C)   Ht 5' 5"  (1.651 m)   Wt 178 lb (80.7 kg)  SpO2 99%   BMI 29.62 kg/m  General appearance: alert, cooperative, appears stated age, and no distress Head: atraumatic, post surgical changes noted Eyes:  Sclera white.  PERRLA.  EOMI Ears:  TMs intact bilaterally Nose: Nares normal. Septum midline. Mucosa normal. No drainage or sinus tenderness. Throat: lips, mucosa, and tongue normal; teeth and gums normal Neck: no adenopathy, supple, symmetrical, trachea midline, and thyroid not enlarged, symmetric, no tenderness/mass/nodules Back: symmetric, no curvature. ROM normal. No CVA tenderness. Lungs: clear to auscultation bilaterally Heart: regular rate and rhythm, S1, S2 normal, no murmur, click, rub or gallop Abdomen: soft, non-tender; bowel sounds normal; no masses,  no organomegaly Pelvic: external genitalia normal, no adnexal masses or tenderness, rectovaginal septum normal, uterus normal size, shape, and consistency, and she has some scant bleeding with Pap smear.  She does have some friability to the cervix but no discrete lesions were appreciated on exam Extremities: extremities normal, atraumatic, no cyanosis or edema Pulses: 2+ and symmetric Skin: Skin color, texture, turgor normal. No rashes or lesions Lymph nodes: Cervical, supraclavicular, and axillary nodes normal. Neurologic: Light oriented x3.   Patellar DTRs 1/4 bilaterally. Psych: Affect flat   Assessment/ Plan: [place] here for annual physical exam.   Annual physical exam  Screening for malignant neoplasm of cervix - Plan: Cytology - PAP([place]), CANCELED: Cytology - PAP([place])  Hyperlipidemia with target LDL less than 100 - Plan: CMP14+EGFR, Lipid Panel, Bayer DCA Hb A1c Waived  PMS2-related Lynch syndrome ([facility]) - Plan: Ambulatory referral to Obstetrics / Gynecology, CBC with Differential/Platelet, CANCELED: Ambulatory referral to Gastroenterology  Status post craniotomy  Asymptomatic microscopic hematuria - Plan: Urine Culture, Urinalysis, Routine w reflex microscopic, CANCELED: Urinalysis  Osteopenia of neck of right femur - Plan: DG [facility] DEXA  BMI 30.0-30.9,adult - Plan: Bayer DCA Hb A1c Waived  Labs have been collected  Pap smear updated.  Urinalysis collected per her request for urine screening and this did demonstrate some RBCs.  Reflex showed 11-30 RBCs.  For this reason urine culture has been sent as patient is asymptomatic from a urinary standpoint.  DEXA scan was ordered given known osteopenia and ongoing treatment with Depo-Provera.  However, she is going to discontinue this after today's injection and proceed with potential IUD placement pending GYN evaluation for the Lynch syndrome.  We discussed the increased risk of gynecologic cancers and the need for ongoing evaluation with a specialist.  She was amenable to this and referral has been placed appropriately  Journii Nierman M. Lajuana Ripple, DO

## 2021-12-07 LAB — CMP14+EGFR
ALT: 28 IU/L (ref 0–32)
AST: 11 IU/L (ref 0–40)
Albumin/Globulin Ratio: 1.9 (ref 1.2–2.2)
Albumin: 4.4 g/dL (ref 3.8–4.8)
Alkaline Phosphatase: 263 IU/L — ABNORMAL HIGH (ref 44–121)
BUN/Creatinine Ratio: 13 (ref 9–23)
BUN: 11 mg/dL (ref 6–24)
Bilirubin Total: 0.4 mg/dL (ref 0.0–1.2)
CO2: 23 mmol/L (ref 20–29)
Calcium: 11.1 mg/dL — ABNORMAL HIGH (ref 8.7–10.2)
Chloride: 106 mmol/L (ref 96–106)
Creatinine, Ser: 0.85 mg/dL (ref 0.57–1.00)
Globulin, Total: 2.3 g/dL (ref 1.5–4.5)
Glucose: 92 mg/dL (ref 70–99)
Potassium: 4.4 mmol/L (ref 3.5–5.2)
Sodium: 144 mmol/L (ref 134–144)
Total Protein: 6.7 g/dL (ref 6.0–8.5)
eGFR: 84 mL/min/{1.73_m2} (ref >59)

## 2021-12-07 LAB — CYTOLOGY - PAP
Comment: NEGATIVE
Diagnosis: NEGATIVE
High risk HPV: NEGATIVE

## 2021-12-07 LAB — CBC WITH DIFFERENTIAL/PLATELET
Basophils Absolute: 0.1 10*3/uL (ref 0.0–0.2)
Basos: 1 %
EOS (ABSOLUTE): 0.1 10*3/uL (ref 0.0–0.4)
Eos: 1 %
Hematocrit: 44.2 % (ref 34.0–46.6)
Hemoglobin: 14.2 g/dL (ref 11.1–15.9)
Immature Grans (Abs): 0 10*3/uL (ref 0.0–0.1)
Immature Granulocytes: 0 %
Lymphocytes Absolute: 2.8 10*3/uL (ref 0.7–3.1)
Lymphs: 35 %
MCH: 28.7 pg (ref 26.6–33.0)
MCHC: 32.1 g/dL (ref 31.5–35.7)
MCV: 89 fL (ref 79–97)
Monocytes Absolute: 0.5 10*3/uL (ref 0.1–0.9)
Monocytes: 6 %
Neutrophils Absolute: 4.7 10*3/uL (ref 1.4–7.0)
Neutrophils: 57 %
Platelets: 324 10*3/uL (ref 150–450)
RBC: 4.95 x10E6/uL (ref 3.77–5.28)
RDW: 13.5 % (ref 11.7–15.4)
WBC: 8.2 10*3/uL (ref 3.4–10.8)

## 2021-12-07 LAB — LIPID PANEL
Chol/HDL Ratio: 3.6 ratio (ref 0.0–4.4)
Cholesterol, Total: 128 mg/dL (ref 100–199)
HDL: 36 mg/dL — ABNORMAL LOW (ref >39)
LDL Chol Calc (NIH): 71 mg/dL (ref 0–99)
Triglycerides: 116 mg/dL (ref 0–149)
VLDL Cholesterol Cal: 21 mg/dL (ref 5–40)

## 2021-12-08 LAB — URINE CULTURE

## 2021-12-13 ENCOUNTER — Ambulatory Visit
Admission: RE | Admit: 2021-12-13 | Discharge: 2021-12-13 | Disposition: A | Discharge status: Home / Self Care | Payer: Commercial Managed Care - PPO | Source: Ambulatory Visit | Attending: Radiation Oncology | Admitting: Radiation Oncology

## 2021-12-13 ENCOUNTER — Other Ambulatory Visit: Payer: Self-pay

## 2021-12-13 ENCOUNTER — Other Ambulatory Visit: Payer: Self-pay | Admitting: *Deleted

## 2021-12-13 VITALS — BP 122/78 | HR 82 | Temp 98.1°F | Resp 17 | Wt 179.2 lb

## 2021-12-13 VITALS — BP 122/78 | HR 82 | Temp 98.1°F | Resp 17 | Ht 65.0 in | Wt 179.3 lb

## 2021-12-13 DIAGNOSIS — D320 Benign neoplasm of cerebral meninges: Secondary | ICD-10-CM | POA: Insufficient documentation

## 2021-12-13 DIAGNOSIS — Z1509 Genetic susceptibility to other malignant neoplasm: Secondary | ICD-10-CM | POA: Diagnosis not present

## 2021-12-13 DIAGNOSIS — D496 Neoplasm of unspecified behavior of brain: Secondary | ICD-10-CM

## 2021-12-13 DIAGNOSIS — Z801 Family history of malignant neoplasm of trachea, bronchus and lung: Secondary | ICD-10-CM | POA: Diagnosis not present

## 2021-12-13 DIAGNOSIS — Z8603 Personal history of neoplasm of uncertain behavior: Secondary | ICD-10-CM

## 2021-12-13 DIAGNOSIS — Z808 Family history of malignant neoplasm of other organs or systems: Secondary | ICD-10-CM | POA: Insufficient documentation

## 2021-12-13 DIAGNOSIS — Z800 Family history of malignant neoplasm of digestive organs: Secondary | ICD-10-CM | POA: Insufficient documentation

## 2021-12-13 NOTE — Progress Notes (Signed)
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 Brain neoplasm ([place])  D49.6     2. History of resection of meningioma  Z98.890    Z86.03       HISTORY OF PRESENT ILLNESS: Laurie Kelley is a 51 y.o. female seen at the request of Dr. Zada Finders. In summary, she initially presented to ED on 01/21/21 with eye changes and associated headaches. She underwent brain and orbit MRI showing: extensive spreading and infiltrating mass/multiple masses involving central skull base most suggestive of meningiomas; extensive osseous involvement of right sphenoid and temporal bones and lateral right orbit. She was seen by Dr. Zada Finders and proceeded to left clinoid craniotomy on 02/18/21. Pathology showed meningioma, WHO grade 1.  She underwent surveillance brain MRI on 05/13/21 showing: dominant remaining meningioma is a plaque-like tumor at greater wing right sphenoid, with hyperostosis causing right exophthalmos, extending into right infratemporal fossa, right cavernous sinus, and likely involves right eustachian tube. She was taken for right side dura craniotomy on 05/18/21, with pathology again showing meningioma, WHO grade 1.  Since that time, she has continued surveillance. Her most recent brain MRI from 11/22/21 revealed: slight growth of residual meningioma tumor along planum sphenoidale and anterior floor of middle cranial fossa on right; small nodules of tumor also along falx and along convexities have minimally enlarged.  Of note, she was referred to genetic counseling here at the cancer center on 06/27/21. 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 She discussed the implications and risks with our counselor on 08/02/21.  She has been kindly referred to [place] today for consideration of radiation therapy to the remaining meningioma. Of note, she recently finished a course of decadron, which patient feels was not effective.  PREVIOUS RADIATION THERAPY: No  PAST MEDICAL HISTORY:  Past Medical History:  Diagnosis Date   Cancer ([place])    Chest pain in adult 07/31/2017   Esophageal reflux 08/18/2014   Family history of brain tumor    Gallstones    Headache    Hyperlipidemia with target LDL less than 100 08/18/2015   Internal hemorrhoids 08/18/2014   PONV (postoperative nausea and vomiting)       PAST SURGICAL HISTORY: Past Surgical History:  Procedure Laterality Date   APPLICATION OF CRANIAL NAVIGATION Left 02/18/2021   Procedure: APPLICATION OF CRANIAL NAVIGATION;  Surgeon: Judith Part, MD;  Location: [facility];  Service: Neurosurgery;  Laterality: Left;   APPLICATION OF CRANIAL NAVIGATION N/A 05/18/2021   Procedure: APPLICATION OF CRANIAL NAVIGATION;  Surgeon: Judith Part, MD;  Location: [facility];  Service: Neurosurgery;  Laterality: N/A;   BRAIN SURGERY     CHOLECYSTECTOMY  1993   CRANIOTOMY Left 02/18/2021   Procedure: Left orbitozygomatic craniotomy for tumor resection  with Brain Lab;  Surgeon: Judith Part, MD;  Location: [place];  Service: Neurosurgery;  Laterality: Left;   CRANIOTOMY Right 05/18/2021   Procedure: Right Orbitozygomatic craniotomy for tumor with brainlab;  Surgeon: Judith Part, MD;  Location: [place];  Service: Neurosurgery;  Laterality: Right;   SHOULDER ARTHROSCOPY Right 11/06/2019   Procedure: ARTHROSCOPY RIGHT SHOULDER,  DEBRIDEMENT X TWO STRUCTURES; OPEN ROTATOR CUFF REPAIR;  Surgeon: Carole Civil, MD;  Location: [facility];  Service: Orthopedics;  Laterality: Right;    FAMILY HISTORY:  Family History  Problem Relation Age of Onset   Hyperlipidemia Mother     Heart disease Father    Other Sister        meningiomas/tumors x11   Brain cancer Maternal Aunt        d. 25s-60s   Lung cancer Maternal Aunt    Bone cancer Maternal Aunt        d. 50s-60s   Liver cancer Paternal Uncle    Lung cancer Paternal Grandmother        d.85   Colon cancer Neg Hx    Rectal cancer Neg Hx    Esophageal cancer Neg Hx    Stomach cancer Neg Hx    Breast cancer Neg Hx     SOCIAL HISTORY:  Social History   Socioeconomic History   Marital status: Divorced    Spouse name: Not on file   Number of children: 2   Years of education: Not on file   Highest education level: Not on file  Occupational History   Not on file  Tobacco Use   Smoking status: Never   Smokeless tobacco: Never  Vaping Use   Vaping Use: Never used  Substance and Sexual Activity   Alcohol use: No   Drug use: No   Sexual activity: Not Currently  Other Topics Concern   Not on file  Social History Narrative   Not on file   Social Determinants of Health   Financial Resource Strain: Not on file  Food Insecurity: Not on file  Transportation Needs: Not on file  Physical Activity: Not on file  Stress: Not on file  Social Connections: Not on file  Intimate Partner Violence: Not on file    ALLERGIES: Saline  MEDICATIONS:  Current Outpatient Medications  Medication Sig Dispense Refill   acetaminophen (TYLENOL) 500 MG tablet Take 1,000 mg by mouth every 8 (eight) hours as needed for moderate pain or headache.     atorvastatin (LIPITOR) 20 MG tablet Take 1 tablet (20 mg total) by mouth daily. 90 tablet 3   cholecalciferol (VITAMIN D3) 25 MCG (1000 UNIT) tablet Take 1,000 Units by mouth daily.     doxylamine, Sleep, (UNISOM) 25 MG tablet Take 25 mg by mouth at bedtime as needed for sleep.     HYDROcodone-acetaminophen (NORCO/VICODIN) 5-325 MG tablet Take 1 tablet by mouth every 6 (six) hours as needed for moderate pain.     magnesium oxide (MAG-OX) 400 MG tablet Take 400 mg by mouth  daily.     promethazine (PHENERGAN) 25 MG tablet Take 25 mg by mouth every 6 (six) hours as needed for nausea or vomiting.     Pumpkin Seed-Soy Germ (AZO BLADDER CONTROL/GO-LESS) CAPS Take 1 capsule by mouth at bedtime.     Current Facility-Administered Medications  Medication Dose Route Frequency Provider Last Rate Last Admin   medroxyPROGESTERone (DEPO-PROVERA) injection 150 mg  150 mg Intramuscular Q90 days Ronnie Doss M, DO   150 mg at 07/01/21 1415    REVIEW OF SYSTEMS:  On review of systems, the patient reports that she is doing well overall. She reports headaches, rated 6/10, to the right side, including cranium and jaw. She denies seizures, nausea, dizziness/ataxia, difficulty with hand coordination, focal numbness/weakness, and confusion/memory deficits. She reports no vision to her left eye and blurred vision  of the right eye, as well as hearing loss in the right ear due to fluid and tumor. A complete review of systems is obtained and is otherwise negative.    PHYSICAL EXAM:  Wt Readings from Last 3 Encounters:  12/13/21 179 lb 3.2 oz (81.3 kg)  12/06/21 178 lb (80.7 kg)  08/22/21 184 lb 4 oz (83.6 kg)   Temp Readings from Last 3 Encounters:  12/13/21 98.1 F (36.7 C) (Oral)  12/06/21 97.9 F (36.6 C)  07/26/21 97.6 F (36.4 C)   BP Readings from Last 3 Encounters:  12/13/21 122/78  12/06/21 136/82  08/22/21 128/90   Pulse Readings from Last 3 Encounters:  12/13/21 82  12/06/21 (!) 102  08/22/21 78    /10  In general this is a well appearing woman in no acute distress. She's alert and oriented x4 and appropriate throughout the examination. Cardiopulmonary assessment is negative for acute distress and she exhibits normal effort.     KPS = 100  100 - Normal; no complaints; no evidence of disease. 90   - Able to carry on normal activity; minor signs or symptoms of disease. 80   - Normal activity with effort; some signs or symptoms of disease. 70   - Cares  for self; unable to carry on normal activity or to do active work. 60   - Requires occasional assistance, but is able to care for most of his personal needs. 50   - Requires considerable assistance and frequent medical care. 67   - Disabled; requires special care and assistance. 20   - Severely disabled; hospital admission is indicated although death not imminent. 48   - Very sick; hospital admission necessary; active supportive treatment necessary. 10   - Moribund; fatal processes progressing rapidly. 0     - Dead  Karnofsky DA, Abelmann [place], Craver LS and Burchenal JH [phone]) The use of the nitrogen mustards in the palliative treatment of carcinoma: with particular reference to bronchogenic carcinoma Cancer 1 634-56  LABORATORY DATA:  Lab Results  Component Value Date   WBC 8.2 12/06/2021   HGB 14.2 12/06/2021   HCT 44.2 12/06/2021   MCV 89 12/06/2021   PLT 324 12/06/2021   Lab Results  Component Value Date   NA 144 12/06/2021   K 4.4 12/06/2021   CL 106 12/06/2021   CO2 23 12/06/2021   Lab Results  Component Value Date   ALT 28 12/06/2021   AST 11 12/06/2021   ALKPHOS 263 (H) 12/06/2021   BILITOT 0.4 12/06/2021     RADIOGRAPHY: DG [facility] DEXA  Result Date: 12/07/2021 EXAM: DUAL X-RAY ABSORPTIOMETRY (DXA) FOR BONE MINERAL DENSITY 12/07/2021 3:55 pm CLINICAL DATA:  51 year old Female Unknown menopause status. screen osteoporosis TECHNIQUE: An axial (e.g., hips, spine) and/or appendicular (e.g., radius) exam was performed, as appropriate, using GE Nature conservation officer at [place]. Images are obtained for bone mineral density measurement and are not obtained for diagnostic purposes. [id number] Exclusions: None. COMPARISON:  06/17/2018 FINDINGS: Scan quality: Good. LUMBAR SPINE (L1-L4): BMD (in g/cm2): 0.949 Z-score: -2.9 Rate of change from previous exam: -13.2 % LEFT FEMORAL NECK: BMD (in g/cm2): 0.795 Z-score: -2.3 LEFT TOTAL HIP: BMD (in  g/cm2): 0.811 Z-score: -2.5 RIGHT FEMORAL NECK: BMD (in g/cm2): 0.782 Z-score: -2.4 RIGHT TOTAL HIP: BMD (in g/cm2): 0.777 Z-score: -2.7 Dual femur rate of change from previous exam: -8.9 % IMPRESSION: Bone mineral density is below expected range for age. RECOMMENDATIONS: 1. All  patients should optimize calcium and vitamin D intake. 2. Patients with diagnosis of osteoporosis or at high risk for fracture should have regular bone mineral density tests.? For patients eligible for Medicare, routine testing is allowed once every 2 years.? The testing frequency can be increased to one year for patients who have rapidly progressing disease, those who are receiving or discontinuing medical therapy to restore bone mass or have additional risk factors. Electronically Signed   By: Zerita Boers M.D.   On: 12/07/2021 16:30   MR BRAIN W WO CONTRAST  Result Date: 11/24/2021 CLINICAL DATA:  Cerebral meningioma. Previous surgery with residual components. EXAM: MRI HEAD WITHOUT AND WITH CONTRAST TECHNIQUE: Multiplanar, multiecho pulse sequences of the brain and surrounding structures were obtained without and with intravenous contrast. CONTRAST:  43m GADAVIST GADOBUTROL 1 MMOL/ML IV SOLN COMPARISON:  05/19/2021 MRI. 09/30/2021 CT. MRI studies 05/13/2021 and 03/11/2021. presentation exam 01/21/2021. FINDINGS: Brain: Extensive bilateral craniotomies and skull base approaches for tumor resection/debulking. Since the prior exams, there is slight growth of tumor along the planum sphenoidale, the largest tumor nodule to the right of midline measuring 13 x 13 x 8 mm compared with 11 x 11 x 6 mm in December. Very slight increase in size of the planum sphenoidale tumor to the left of midline, perhaps growing a mm. Residual tumor along the floor of the middle cranial fossa on the right shows slight increase in thickening, 1 or 2 mm. Tumor at the orbital apex appears similar. Degree of exophthalmos on the right appears similar to the CT but  is reduced when compared to the December MRI due to the lateral orbital decompression. Very slight growth in foci of meningioma at the confluence of the falx and tentorium, along the anterior superior falx, and small foci along the convexities. None of these are large or have mass-effect upon the brain. The tumor the confluence of the falx and tentorium is the largest, measuring approximately 17 x 6 x 12 mm. No evidence of hydrocephalus.  No extra-axial fluid collection. Vascular: Major vessels at the base of the brain show flow. Tumor partially encases the right ACA as seen previously. Skull and upper cervical spine: Hyperostotic bone changes on the right related to meningioma appear largely the same. There has been lateral orbital decompression on the right since the MRI of December, as shown on the CT scan of April. Sinuses: Tumor involvement of the right division of the sphenoid sinus as seen chronically appears the same. Other paranasal sinuses are clear. Other: Right mastoid effusion persists. IMPRESSION: Since the study of December 2022 and follow-up CT of April 2023, there has been slight growth of residual meningioma tumor along the planum sphenoidale and anterior floor of the middle cranial fossa on the right. Small nodules of tumor also along the falx and along the convexities have enlarged in a minimal fashion. Right lateral orbital decompression has been performed since the MRI of December, as documented on the interval CT. Electronically Signed   By: MNelson ChimesM.D.   On: 11/24/2021 09:11      IMPRESSION/PLAN: 1. 51y.o. woman with bilateral meningiomas, s/p craniotomy 02/2021 (left) and 04/2021 (right), with residual disease that is enlarging.  Today, we talked to the patient and family about the findings and workup thus far. We discussed the natural history of meningiomas and general treatment, highlighting the role of radiotherapy in the management. However, the patient has Lynch Syndrome,  which increases her likelihood of developing other cancers. I discussed  that radiation to the meningioma might carry a higher risk of secondary malignancies, including gliomas due to Lynch Syndrome. We discussed the pros and cons of proceeding with radiation therapy, and she is interested in treatment. We discussed the available radiation techniques, and focused on the details and logistics of delivery. In her case, I recommend fractionated stereotactic radiotherapy, likely 25 Gy in 5 fractions, with Dr. Venetia Constable and myself. We reviewed the anticipated acute and late sequelae associated with radiation in this setting. The patient was encouraged to ask questions that were answered to her satisfaction.  At the end of our conversation, the patient is interested in starting treatment ASAP. We will work to coordinate that, as well as with Dr. Zada Finders. I enjoyed meeting her today and look forward to participating in her care.  I personally spent 60 minutes in this encounter including chart review, reviewing radiological studies, meeting face-to-face with the patient, entering orders and completing documentation.   ------------------------------------------------   Tyler Pita, MD [place]: [phone]  Fax: [phone] [email or web address]  Skype  [email or web address]   This document serves as a record of services personally performed by Tyler Pita, MD. It was created on his behalf by Wilburn Mylar, a trained medical scribe. The creation of this record is based on the scribe's personal observations and the provider's statements to them. This document has been checked and approved by the attending provider.

## 2021-12-14 ENCOUNTER — Other Ambulatory Visit: Payer: Self-pay

## 2021-12-14 DIAGNOSIS — C7931 Secondary malignant neoplasm of brain: Secondary | ICD-10-CM

## 2021-12-15 ENCOUNTER — Other Ambulatory Visit: Payer: Commercial Managed Care - PPO

## 2021-12-16 ENCOUNTER — Ambulatory Visit
Admission: RE | Admit: 2021-12-16 | Discharge: 2021-12-16 | Disposition: A | Discharge status: Home / Self Care | Payer: Commercial Managed Care - PPO | Source: Ambulatory Visit | Attending: Radiation Oncology | Admitting: Radiation Oncology

## 2021-12-16 ENCOUNTER — Other Ambulatory Visit: Payer: Self-pay

## 2021-12-16 ENCOUNTER — Other Ambulatory Visit: Payer: Self-pay | Admitting: Physician Assistant

## 2021-12-16 ENCOUNTER — Telehealth: Payer: Self-pay | Admitting: *Deleted

## 2021-12-16 VITALS — BP 137/68 | HR 87 | Temp 98.6°F | Resp 18 | Ht 65.0 in | Wt 178.0 lb

## 2021-12-16 DIAGNOSIS — D496 Neoplasm of unspecified behavior of brain: Secondary | ICD-10-CM | POA: Insufficient documentation

## 2021-12-16 DIAGNOSIS — C7931 Secondary malignant neoplasm of brain: Secondary | ICD-10-CM

## 2021-12-16 DIAGNOSIS — Z9889 Other specified postprocedural states: Secondary | ICD-10-CM | POA: Insufficient documentation

## 2021-12-16 LAB — PTH, INTACT AND CALCIUM
Calcium: 11.9 mg/dL — ABNORMAL HIGH (ref 8.7–10.2)
PTH: 60 pg/mL (ref 15–65)

## 2021-12-16 MED ORDER — SODIUM CHLORIDE 0.9% FLUSH
5.0000 mL | INTRAVENOUS | Status: DC | PRN
  Administered 2021-12-16: 5 mL via INTRAVENOUS

## 2021-12-16 NOTE — Telephone Encounter (Signed)
CALLED PATIENT TO INFORM OF MRI @ [facility], PATIENT TO REPORT @ 2:30 PM ON 12-17-21 AND PATIENT IS NOT WEAR ANY METAL, SPOKE WITH PATIENT AND SHE IS AWARE OF THIS TEST AND THE INSTRUCTIONS

## 2021-12-16 NOTE — Progress Notes (Addendum)
Patient states "She has an allergy (nausea & vomiting) to large amounts of regular saline. Dr. Tammi Klippel states "Patient is okay to receive 62m of saline for an IV flush." I proceeded to enter RM #12 and place patient's IV.

## 2021-12-16 NOTE — Progress Notes (Addendum)
Has armband been applied?  Yes.    Does patient have an allergy to IV contrast dye?: No.   Has patient ever received premedication for IV contrast dye?: No.   Does patient take metformin?: No.  If patient does take metformin when was the last dose: N/A  Date of lab work: December 06, 2021 BUN: 11 CR: 0.85  IV site: antecubital left, condition patent and no redness  Has IV site been added to flowsheet?  Yes.    BP (!) 142/72 (BP Location: Right Arm, Patient Position: Sitting, Cuff Size: Normal)   Pulse 95   Temp 98.3 F (36.8 C) (Temporal)   Resp 18   Ht '5\' 5"'$  (1.651 m)   Wt 178 lb (80.7 kg)   SpO2 100%   BMI 29.62 kg/m   Patient has requested that her IV stay in place until after her MRI today 12/16/21 at 1:00pm. She was given the okay by the Rad/Onc nurses/SIM. Mont Dutton w/ [facility] notified as well.

## 2021-12-17 ENCOUNTER — Encounter (HOSPITAL_COMMUNITY): Payer: Self-pay

## 2021-12-17 ENCOUNTER — Ambulatory Visit (HOSPITAL_COMMUNITY)
Admission: RE | Admit: 2021-12-17 | Discharge: 2021-12-17 | Disposition: A | Discharge status: Home / Self Care | Payer: Commercial Managed Care - PPO | Source: Ambulatory Visit | Attending: Radiation Oncology | Admitting: Radiation Oncology

## 2021-12-17 NOTE — Progress Notes (Signed)
  Radiation Oncology         [phone] ________________________________  Name: Laurie Kelley MRN: [id number]  Date: 12/16/2021  DOB: 1970/10/11  SIMULATION AND TREATMENT PLANNING NOTE    ICD-10-CM   1. 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  Identity was confirmed.  All relevant records and images related to the planned course of therapy were reviewed.  The patient freely provided informed written consent to proceed with treatment after reviewing the details related to the planned course of therapy. The consent form was witnessed and verified by the simulation staff. Intravenous access was established for contrast administration. Then, the patient was set-up in a stable reproducible supine position for radiation therapy.  A relocatable thermoplastic stereotactic head frame was fabricated for precise immobilization.  CT images were obtained.  Surface markings were placed.  The CT images were loaded into the planning software and fused with the patient's targeting MRI scan.  Then the target and avoidance structures were contoured.  Treatment planning then occurred.  The radiation prescription was entered and confirmed.  I have requested 3D planning  I have requested a DVH of the following structures: Brain stem, brain, left eye, right eye, lenses, optic chiasm, target volumes, uninvolved brain, and normal tissue.    SPECIAL TREATMENT PROCEDURE:  The planned course of therapy using radiation constitutes a special treatment procedure. Special care is required in the management of this patient for the following reasons. This treatment constitutes a Special Treatment Procedure for the following reason: High dose per fraction requiring special monitoring for increased toxicities  of treatment including daily imaging.  The special nature of the planned course of radiotherapy will require increased physician supervision and oversight to ensure patient's safety with optimal treatment outcomes.  This requires extended time and effort.  PLAN:  The patient will receive 25 Gy in 5 fractions.  ________________________________  Sheral Apley Tammi Klippel, M.D.

## 2021-12-17 NOTE — Progress Notes (Signed)
Pt here for MRI brain wwo today. She mentioned getting allergic symptoms on contrast administration on last MRI. I spoke with neuro rad Dr. Jobe Igo, he suggested pt come back with a 13 hr prep for allergic reaction to mri contrast.

## 2021-12-19 ENCOUNTER — Telehealth: Payer: Self-pay

## 2021-12-19 ENCOUNTER — Other Ambulatory Visit: Payer: Self-pay

## 2021-12-19 DIAGNOSIS — C7931 Secondary malignant neoplasm of brain: Secondary | ICD-10-CM

## 2021-12-19 MED ORDER — DIPHENHYDRAMINE HCL 50 MG PO TABS: 50.0000 mg | ORAL_TABLET | Freq: Once | ORAL | 0 refills | Status: DC

## 2021-12-19 MED ORDER — PREDNISONE 50 MG PO TABS: ORAL_TABLET | ORAL | 0 refills | Status: DC

## 2021-12-19 NOTE — Telephone Encounter (Signed)
Phone call to patient to review instructions for 13 hr prep for MRI w/ contrast on 12/29/21 at 1:00 PM. Prescription called into [facility]. Pt aware and verbalized understanding of instructions.  Prescription: Pt to take 50 mg of prednisone on 12/29/21 at 12:00 AM, 50 mg of prednisone on 12/29/21 at 6:00 AM, and 50 mg of prednisone on 12/29/21 at 12:00 PM. Pt is also to take 50 mg of benadryl on 12/29/21 at 12:00 PM. Please call [phone] with any questions.    Benadryl also called in as a prescription, per the patients request. Pt also advised to have a driver the day of taking this medication as it may cause drowsiness. Pt verbalized understanding.

## 2021-12-27 ENCOUNTER — Ambulatory Visit: Payer: Commercial Managed Care - PPO | Admitting: Radiation Oncology

## 2021-12-27 ENCOUNTER — Ambulatory Visit: Payer: Commercial Managed Care - PPO | Admitting: Women's Health

## 2021-12-28 ENCOUNTER — Ambulatory Visit: Payer: Commercial Managed Care - PPO | Admitting: Radiation Oncology

## 2021-12-29 ENCOUNTER — Ambulatory Visit
Admission: RE | Admit: 2021-12-29 | Discharge: 2021-12-29 | Disposition: A | Discharge status: Home / Self Care | Payer: Commercial Managed Care - PPO | Source: Ambulatory Visit | Attending: Radiation Oncology | Admitting: Radiation Oncology

## 2021-12-29 ENCOUNTER — Ambulatory Visit: Payer: Commercial Managed Care - PPO | Admitting: Radiation Oncology

## 2021-12-29 DIAGNOSIS — C7931 Secondary malignant neoplasm of brain: Secondary | ICD-10-CM

## 2021-12-29 MED ORDER — GADOBENATE DIMEGLUMINE 529 MG/ML IV SOLN
15.0000 mL | Freq: Once | INTRAVENOUS | Status: AC | PRN
  Administered 2021-12-29: 15 mL via INTRAVENOUS

## 2021-12-30 ENCOUNTER — Ambulatory Visit: Payer: Commercial Managed Care - PPO | Admitting: Radiation Oncology

## 2022-01-02 ENCOUNTER — Ambulatory Visit
Admission: RE | Admit: 2022-01-02 | Payer: Commercial Managed Care - PPO | Source: Ambulatory Visit | Admitting: Radiation Oncology

## 2022-01-02 DIAGNOSIS — D496 Neoplasm of unspecified behavior of brain: Secondary | ICD-10-CM | POA: Diagnosis present

## 2022-01-02 DIAGNOSIS — Z801 Family history of malignant neoplasm of trachea, bronchus and lung: Secondary | ICD-10-CM | POA: Insufficient documentation

## 2022-01-02 DIAGNOSIS — Z1509 Genetic susceptibility to other malignant neoplasm: Secondary | ICD-10-CM | POA: Diagnosis not present

## 2022-01-02 DIAGNOSIS — Z800 Family history of malignant neoplasm of digestive organs: Secondary | ICD-10-CM | POA: Insufficient documentation

## 2022-01-02 DIAGNOSIS — Z808 Family history of malignant neoplasm of other organs or systems: Secondary | ICD-10-CM | POA: Insufficient documentation

## 2022-01-04 ENCOUNTER — Other Ambulatory Visit: Payer: Self-pay

## 2022-01-04 ENCOUNTER — Ambulatory Visit
Admission: RE | Admit: 2022-01-04 | Discharge: 2022-01-04 | Disposition: A | Discharge status: Home / Self Care | Payer: Commercial Managed Care - PPO | Source: Ambulatory Visit | Attending: Radiation Oncology | Admitting: Radiation Oncology

## 2022-01-04 DIAGNOSIS — D496 Neoplasm of unspecified behavior of brain: Secondary | ICD-10-CM | POA: Diagnosis not present

## 2022-01-04 LAB — RAD ONC ARIA SESSION SUMMARY
Course Elapsed Days: 0
Plan Fractions Treated to Date: 1
Plan Prescribed Dose Per Fraction: 5 Gy
Plan Total Fractions Prescribed: 5
Plan Total Prescribed Dose: 25 Gy
Reference Point Dosage Given to Date: 5 Gy
Reference Point Session Dosage Given: 5 Gy
Session Number: 1

## 2022-01-04 NOTE — Progress Notes (Signed)
Patient to nursing for 15 minute observation [place] [facility].  Denies headache, fatigue, nausea, ringing in ears, and not on Decadron.  Patient reports currently has vision issues not caused by radiation treatment.  Gait is stable no assisted devices needed.  Vitals: 98.2-71-18-125/79.

## 2022-01-06 ENCOUNTER — Ambulatory Visit
Admission: RE | Admit: 2022-01-06 | Discharge: 2022-01-06 | Disposition: A | Discharge status: Home / Self Care | Payer: Commercial Managed Care - PPO | Source: Ambulatory Visit | Attending: Radiation Oncology | Admitting: Radiation Oncology

## 2022-01-06 ENCOUNTER — Other Ambulatory Visit: Payer: Self-pay

## 2022-01-06 DIAGNOSIS — Z9889 Other specified postprocedural states: Secondary | ICD-10-CM

## 2022-01-06 DIAGNOSIS — D496 Neoplasm of unspecified behavior of brain: Secondary | ICD-10-CM | POA: Diagnosis not present

## 2022-01-06 LAB — RAD ONC ARIA SESSION SUMMARY
Course Elapsed Days: 2
Plan Fractions Treated to Date: 2
Plan Prescribed Dose Per Fraction: 5 Gy
Plan Total Fractions Prescribed: 5
Plan Total Prescribed Dose: 25 Gy
Reference Point Dosage Given to Date: 10 Gy
Reference Point Session Dosage Given: 3.4633 Gy
Session Number: 2

## 2022-01-06 NOTE — Progress Notes (Signed)
Patient to nursing for 15 minute observation [facility].  Denies headache, fatigue, nausea, ringing in ears, and not on Decadron.  Patient reports currently has vision issues not caused by radiation treatment.  Vitals: 97.9-75-18-134/76 O2sat '@100'$ %.

## 2022-01-09 ENCOUNTER — Ambulatory Visit
Admission: RE | Admit: 2022-01-09 | Discharge: 2022-01-09 | Disposition: A | Discharge status: Home / Self Care | Payer: Commercial Managed Care - PPO | Source: Ambulatory Visit | Attending: Radiation Oncology | Admitting: Radiation Oncology

## 2022-01-09 ENCOUNTER — Other Ambulatory Visit: Payer: Self-pay

## 2022-01-09 DIAGNOSIS — D496 Neoplasm of unspecified behavior of brain: Secondary | ICD-10-CM | POA: Diagnosis not present

## 2022-01-09 LAB — RAD ONC ARIA SESSION SUMMARY
Course Elapsed Days: 5
Plan Fractions Treated to Date: 3
Plan Prescribed Dose Per Fraction: 5 Gy
Plan Total Fractions Prescribed: 5
Plan Total Prescribed Dose: 25 Gy
Reference Point Dosage Given to Date: 15 Gy
Reference Point Session Dosage Given: 5 Gy
Session Number: 3

## 2022-01-11 ENCOUNTER — Other Ambulatory Visit: Payer: Self-pay

## 2022-01-11 ENCOUNTER — Ambulatory Visit
Admission: RE | Admit: 2022-01-11 | Discharge: 2022-01-11 | Disposition: A | Discharge status: Home / Self Care | Payer: Commercial Managed Care - PPO | Source: Ambulatory Visit | Attending: Radiation Oncology | Admitting: Radiation Oncology

## 2022-01-11 VITALS — BP 127/62 | HR 70 | Temp 98.1°F | Resp 20

## 2022-01-11 DIAGNOSIS — Z8603 Personal history of neoplasm of uncertain behavior: Secondary | ICD-10-CM

## 2022-01-11 DIAGNOSIS — D496 Neoplasm of unspecified behavior of brain: Secondary | ICD-10-CM | POA: Diagnosis not present

## 2022-01-11 LAB — RAD ONC ARIA SESSION SUMMARY
Course Elapsed Days: 7
Plan Fractions Treated to Date: 4
Plan Prescribed Dose Per Fraction: 5 Gy
Plan Total Fractions Prescribed: 5
Plan Total Prescribed Dose: 25 Gy
Reference Point Dosage Given to Date: 20 Gy
Reference Point Session Dosage Given: 5 Gy
Session Number: 4

## 2022-01-11 NOTE — Progress Notes (Signed)
Patient to nursing for 15 minute observation [place] [facility]. Denies headache, fatigue, nausea, ringing in ears, and no new visual changes.  Ambulates without difficulty.  Vitals: 98.1-78-20-127/62 O2 sat 100%.

## 2022-01-12 NOTE — Addendum Note (Signed)
Encounter addended by: Judith Part, MD on: 01/12/2022 12:01 PM  Actions taken: Clinical Note Signed

## 2022-01-12 NOTE — Op Note (Signed)
  Name: Laurie Kelley  MRN: [id number]  Date: 01/06/2022   DOB: January 28, 1971  Stereotactic Radiosurgery Operative Note  PRE-OPERATIVE DIAGNOSIS:  Intracranial meningioma  POST-OPERATIVE DIAGNOSIS:  Same  PROCEDURE:  Stereotactic Radiosurgery  SURGEON:  Judith Part, MD  NARRATIVE: The patient underwent a radiation treatment planning session in the radiation oncology simulation suite under the care of the radiation oncology physician and physicist.  I participated closely in the radiation treatment planning afterwards. The patient underwent planning CT which was fused to 3T high resolution MRI with 1 mm axial slices.  These images were fused on the planning system.  We contoured the gross target volumes and subsequently expanded this to yield the Planning Target Volume. I actively participated in the planning process.  I helped to define and review the target contours and also the contours of the optic pathway, eyes, brainstem and selected nearby organs at risk.  All the dose constraints for critical structures were reviewed and compared to AAPM Task Group 101.  The prescription dose conformity was reviewed.  I approved the plan electronically.    Accordingly, Kayle L Hockley was brought to the TrueBeam stereotactic radiation treatment linac and placed in the custom immobilization mask.  The patient was aligned according to the IR fiducial markers with BrainLab Exactrac, then orthogonal x-rays were used in ExacTrac with the 6DOF robotic table and the shifts were made to align the patient  [place] received stereotactic radiosurgery uneventfully.    Lesions treated:  1   Complex lesions treated:  1 (>3.5 cm, <63m of optic path, or within the brainstem)   The detailed description of the procedure is recorded in the radiation oncology procedure note.  I was present for the duration of the procedure.  DISPOSITION:  Following delivery, the patient was transported to nursing in stable  condition and monitored for possible acute effects to be discharged to home in stable condition with follow-up in one month.  TJudith Part MD 01/12/2022 11:52 AM

## 2022-01-13 ENCOUNTER — Other Ambulatory Visit: Payer: Self-pay

## 2022-01-13 ENCOUNTER — Ambulatory Visit
Admission: RE | Admit: 2022-01-13 | Discharge: 2022-01-13 | Disposition: A | Discharge status: Home / Self Care | Payer: Commercial Managed Care - PPO | Source: Ambulatory Visit | Attending: Radiation Oncology | Admitting: Radiation Oncology

## 2022-01-13 ENCOUNTER — Encounter: Payer: Self-pay | Admitting: Urology

## 2022-01-13 VITALS — BP 103/73 | HR 75 | Temp 96.3°F | Resp 18

## 2022-01-13 DIAGNOSIS — Z8603 Personal history of neoplasm of uncertain behavior: Secondary | ICD-10-CM

## 2022-01-13 DIAGNOSIS — D496 Neoplasm of unspecified behavior of brain: Secondary | ICD-10-CM | POA: Diagnosis not present

## 2022-01-13 LAB — RAD ONC ARIA SESSION SUMMARY
Course Elapsed Days: 9
Plan Fractions Treated to Date: 5
Plan Prescribed Dose Per Fraction: 5 Gy
Plan Total Fractions Prescribed: 5
Plan Total Prescribed Dose: 25 Gy
Reference Point Dosage Given to Date: 25 Gy
Reference Point Session Dosage Given: 5 Gy
Session Number: 5

## 2022-01-13 NOTE — Progress Notes (Signed)
Patient to nursing for 15 minute observation Laurie Kelley. Denies headache, fatigue, nausea, ringing in ears, and no new visual changes.  Ambulates without difficulty.  Vitals:  96.3-75-18-103/73 O2 sat 100%.

## 2022-01-23 ENCOUNTER — Ambulatory Visit (INDEPENDENT_AMBULATORY_CARE_PROVIDER_SITE_OTHER): Payer: Commercial Managed Care - PPO | Admitting: Women's Health

## 2022-01-23 ENCOUNTER — Encounter: Payer: Self-pay | Admitting: Women's Health

## 2022-01-23 ENCOUNTER — Telehealth: Payer: Self-pay | Admitting: Radiation Therapy

## 2022-01-23 VITALS — BP 126/71 | HR 82 | Wt 176.0 lb

## 2022-01-23 DIAGNOSIS — Z1509 Genetic susceptibility to other malignant neoplasm: Secondary | ICD-10-CM

## 2022-01-23 DIAGNOSIS — Z3009 Encounter for other general counseling and advice on contraception: Secondary | ICD-10-CM | POA: Diagnosis not present

## 2022-01-23 DIAGNOSIS — R319 Hematuria, unspecified: Secondary | ICD-10-CM

## 2022-01-23 DIAGNOSIS — Z30430 Encounter for insertion of intrauterine contraceptive device: Secondary | ICD-10-CM

## 2022-01-23 DIAGNOSIS — Z1273 Encounter for screening for malignant neoplasm of ovary: Secondary | ICD-10-CM | POA: Diagnosis not present

## 2022-01-23 NOTE — Patient Instructions (Signed)
Primary Care Providers Dr. Zack Hall ([place]) [phone] [facility] [phone] [facility] ([place]) [phone] [facility] ([place]) [phone] [facility] ([place]) [phone] [facility] ([place]) [phone] [facility] [phone] [facility] [phone]  

## 2022-01-23 NOTE — Progress Notes (Signed)
GYN VISIT Patient name: Laurie Kelley MRN [id number]  Date of birth: 10-09-1970 Chief Complaint:   Wants IUD (Has been on Depo for 30 years, 13% bone loss on recent DXA scan)  History of Present Illness:   Laurie Kelley is a 51 y.o. G85P0 African-American female being seen today for IUD insertion. Has been on depo x 98yr for period management, not currently having periods, not sexually active in 159yr Recently had DEXA scan 12/06/21 w/ BMD below expected range for age, so was advised she stop depo. She recently was dx w/ multiple brain tumors, underwent partial resection of skull base and dx w/ meningiomas, radiation, and had stereotactic radiosurgery on 01/06/22. She had genetic testing which revealed Lynch syndrome, states 1 of her daughters also tested positive. States she was told she would need a hysterectomy, but is worried about taking off more work and losing her job. She is currently out until 10/12. She had a negative/Birads 1 mammogram 10/10/21. States she has met w/ GI, but had 'clot on brain' at the time, so GI plans TCS in Oct.  Reports she has blood in her urine, wants rechecked today. Denies UTI sx.   No LMP recorded. Patient has had an injection. The current method of family planning is abstinence.  Last pap 12/06/21. Results were: NILM w/ HRHPV negative     01/23/2022    8:41 AM 12/06/2021   11:17 AM 07/26/2021    8:35 AM 08/02/2020   10:17 AM 02/12/2019    4:27 PM  Depression screen PHQ 2/9  Decreased Interest 0 0 0 0 0  Down, Depressed, Hopeless 0 0 0 0 0  PHQ - 2 Score 0 0 0 0 0  Altered sleeping 1   0 0  Tired, decreased energy 1   0 0  Change in appetite 0   0 0  Feeling bad or failure about yourself  0   0 0  Trouble concentrating 0   0 0  Moving slowly or fidgety/restless 0   0 0  Suicidal thoughts 0   0 0  PHQ-9 Score 2   0 0        01/23/2022    8:41 AM 12/06/2021   11:17 AM 07/26/2021    8:36 AM  GAD 7 : Generalized Anxiety Score  Nervous, Anxious, on Edge  0 0 0  Control/stop worrying 1 0 0  Worry too much - different things 0 0 0  Trouble relaxing 0 0 0  Restless 0 0 0  Easily annoyed or irritable 0 0 0  Afraid - awful might happen 0 0 0  Total GAD 7 Score 1 0 0  Anxiety Difficulty  Not difficult at all Not difficult at all     Review of Systems:   Pertinent items are noted in HPI Denies fever/chills, dizziness, headaches, visual disturbances, fatigue, shortness of breath, chest pain, abdominal pain, vomiting, abnormal vaginal discharge/itching/odor/irritation, problems with periods, bowel movements, urination, or intercourse unless otherwise stated above.  Pertinent History Reviewed:  Reviewed past medical,surgical, social, obstetrical and family history.  Reviewed problem list, medications and allergies. Physical Assessment:   Vitals:   01/23/22 0844  BP: 126/71  Pulse: 82  Weight: 176 lb (79.8 kg)  Body mass index is 29.29 kg/m.       Physical Examination:   General appearance: alert, well appearing, and in no distress  Mental status: alert, oriented to person, place, and time  Skin: warm & dry  Cardiovascular: normal heart rate noted  Respiratory: normal respiratory effort, no distress  Abdomen: soft, non-tender   Pelvic: examination not indicated  Extremities: no edema   Chaperone: N/A    No results found for this or any previous visit (from the past 24 hour(s)).  Assessment & Plan:  1) Period management w/ recent dx Lynch Syndrome> on depo x 73yr, however has bone loss on recent DEXA and was advised to stop depo. Was interested in IUD, however after discussing risks/benefits as well as recent Lynch syndrome dx and recommendation for hysterectomy, she decided against IUD and wants to proceed w/ hysterectomy as soon as possible as she has to return to work 10/12 and is afraid of losing job if misses more work. Discussed w/ Dr. ONelda Marseille [facility] CA[facility]if neg can  schedule pre-op w/ Dr. ONelda Marseille if any question of pathology will refer to gyn/onc.   2) Hematuria> pt reports had blood in most recent urine, wants checked again. UA & urine cx sent.   Meds: No orders of the defined types were placed in this encounter.   Orders Placed This Encounter  Procedures   Urine Culture   [place]oreaPELVIC COMPLETE WITH TRANSVAGINAL   Urinalysis   CA 125    Return for prn.  [facility]8/12/2021 1:43 PM

## 2022-01-23 NOTE — Telephone Encounter (Signed)
I received a message from Ms. Laurie Kelley concerning a patch of hair loss behind her Rt ear. She completed her radiation course on 01/13/22. I shared that patchy hair loss is common when radiation is given to the brain, but in her case this should not be permanent. Her hair should start growing back on its own in two to four months and there is nothing she needs to do in the interim. She was relieved and thankful for the return call.    Mont Dutton R.T.(R)(T) Radiation Special Procedures Navigator

## 2022-01-24 ENCOUNTER — Ambulatory Visit (HOSPITAL_BASED_OUTPATIENT_CLINIC_OR_DEPARTMENT_OTHER)
Admission: RE | Admit: 2022-01-24 | Discharge: 2022-01-24 | Disposition: A | Discharge status: Home / Self Care | Payer: Commercial Managed Care - PPO | Source: Ambulatory Visit | Attending: Women's Health | Admitting: Women's Health

## 2022-01-24 DIAGNOSIS — Z1509 Genetic susceptibility to other malignant neoplasm: Secondary | ICD-10-CM | POA: Diagnosis present

## 2022-01-25 LAB — CA 125: Cancer Antigen (CA) 125: 6 U/mL (ref 0.0–38.1)

## 2022-01-26 ENCOUNTER — Ambulatory Visit (INDEPENDENT_AMBULATORY_CARE_PROVIDER_SITE_OTHER): Payer: Commercial Managed Care - PPO | Admitting: Obstetrics & Gynecology

## 2022-01-26 ENCOUNTER — Encounter: Payer: Self-pay | Admitting: Obstetrics & Gynecology

## 2022-01-26 VITALS — BP 134/77 | HR 83 | Ht 65.0 in | Wt 172.8 lb

## 2022-01-26 DIAGNOSIS — Z1273 Encounter for screening for malignant neoplasm of ovary: Secondary | ICD-10-CM | POA: Diagnosis not present

## 2022-01-26 DIAGNOSIS — Z299 Encounter for prophylactic measures, unspecified: Secondary | ICD-10-CM

## 2022-01-26 DIAGNOSIS — Z01818 Encounter for other preprocedural examination: Secondary | ICD-10-CM

## 2022-01-26 DIAGNOSIS — Z8603 Personal history of neoplasm of uncertain behavior: Secondary | ICD-10-CM

## 2022-01-26 DIAGNOSIS — Z9889 Other specified postprocedural states: Secondary | ICD-10-CM | POA: Diagnosis not present

## 2022-01-26 DIAGNOSIS — Z1509 Genetic susceptibility to other malignant neoplasm: Secondary | ICD-10-CM

## 2022-01-26 LAB — URINE CULTURE

## 2022-01-26 NOTE — Progress Notes (Signed)
GYN VISIT Patient name: Laurie KEMPA MRN [id number]  Date of birth: 27-Apr-1971 Chief Complaint:   Pre-op Exam  History of Present Illness:   Placida L Laviolette is a 51 y.o. G2P2 female being seen today for preop consultation for prophylactic hyst/BSO due to Lynch syndrome.  Previously on Depot- no menses for 46yr.  She recently discontinued this medication due to impact on bones.  Last shot June 2023, she has not yet had a period.  Not sexually active, was trying to avoid a period.  Denies acute gyn concerns- denies vaginal discharge, itching or irritation.  Denies pelvic or abdominal pain.  Of note, pt was recently diagnosed with Lynch syndrome and referred to our office for further evaluation/work up.  Pt has done some research on her own and is aware of surgical prophylaxis.   Also being seen by GI, colonoscopy likely in October.  She has been struggling with chronic pain s/p surgical intervention of meningioma.  She is followed by neurosurgery.  In review,she reports h/o blood clots- previously on aspirin, denies any other blood thinner or medication currently.  No LMP recorded. Patient has had an injection.     01/23/2022    8:41 AM 12/06/2021   11:17 AM 07/26/2021    8:35 AM 08/02/2020   10:17 AM 02/12/2019    4:27 PM  Depression screen PHQ 2/9  Decreased Interest 0 0 0 0 0  Down, Depressed, Hopeless 0 0 0 0 0  PHQ - 2 Score 0 0 0 0 0  Altered sleeping 1   0 0  Tired, decreased energy 1   0 0  Change in appetite 0   0 0  Feeling bad or failure about yourself  0   0 0  Trouble concentrating 0   0 0  Moving slowly or fidgety/restless 0   0 0  Suicidal thoughts 0   0 0  PHQ-9 Score 2   0 0     Review of Systems:   Pertinent items are noted in HPI Denies fever/chills, dizziness, shortness of breath, chest pain, abdominal pain, vomiting, no problems with periods, bowel movements, urination, or intercourse unless otherwise stated above.  Pertinent History Reviewed:   Reviewed past medical,surgical, social, obstetrical and family history.  Reviewed problem list, medications and allergies. Physical Assessment:   Vitals:   01/26/22 0910  BP: 134/77  Pulse: 83  Weight: 172 lb 12.8 oz (78.4 kg)  Height: '5\' 5"'$  (1.651 m)  Body mass index is 28.76 kg/m.       Physical Examination:   General appearance: alert, well appearing, mild distress due to anxiety related to medical concerns  Psych: mood appropriate, normal affect  Skin: warm & dry   Cardiovascular: normal heart rate noted  Respiratory: normal respiratory effort, no distress  Abdomen: soft, non-tender, no rebound, no guarding  Pelvic: VULVA: normal appearing vulva with no masses, tenderness or lesions, VAGINA: normal appearing vagina with normal color and discharge, no lesions, narrowed introitus noted CERVIX: normal appearing cervix without discharge or lesions, UTERUS: uterus is normal size, shape, consistency and nontender.  With tenaculum placed minimal descent of uterus  Extremities: no edema   Chaperone:  pt declined     Pelvic [place] 5.7cm uterus with no abnormalities.  Normal ovaries bilaterally.  CA 125: 6    Assessment & Plan:  1) Lynch syndrome -reviewed surgical prophylaxis of hysterectomy, bilateral salpingo-oophorectomy -discussed hospital expectations- pt desires same day surgery if possible -reviewed recovery 6-8wks -discussed  risk/benefits including but not limited to risk of bleeding, infection and injury to surrounding organs -based on exam- minimal descent, prior difficulties with surgery and prior medical history concern about prolonged Trendelenburg -plan for TAH, BSO via mini-lap -will plan for medical clearance from PCP and neurosurgery prior to proceeding -due to work constraints, will plan to schedule next available  Over 66mn. Spent in consultation reviewed the above information  JJanyth Pupa DO Attending [place] [facility] for [employer] [place]

## 2022-01-31 ENCOUNTER — Encounter: Payer: Self-pay | Admitting: Women's Health

## 2022-01-31 MED ORDER — NITROFURANTOIN MONOHYD MACRO 100 MG PO CAPS: 100.0000 mg | ORAL_CAPSULE | Freq: Two times a day (BID) | ORAL | 0 refills | Status: DC

## 2022-01-31 NOTE — Addendum Note (Signed)
Addended by: Roma Schanz on: 01/31/2022 01:32 PM   Modules accepted: Orders

## 2022-02-14 ENCOUNTER — Encounter: Payer: Self-pay | Admitting: Urology

## 2022-02-14 NOTE — Progress Notes (Signed)
Telephone appointment. I verified patient's identity and began nursing interview. Patient reports consistent pain in RT frontal lobe of brain 6/10. No other issues reported at this time.  Meaningful use complete. NO chances of pregnancy.  Reminded patient of her 8:30am-02/15/22 telephone appointment w/ Ashlyn Bruning PA-C. I left my extension [phone] in case patient needs anything. Patient verbalized understanding.  Patient contact [phone]

## 2022-02-15 ENCOUNTER — Ambulatory Visit
Admission: RE | Admit: 2022-02-15 | Discharge: 2022-02-15 | Disposition: A | Discharge status: Home / Self Care | Payer: Commercial Managed Care - PPO | Source: Ambulatory Visit | Attending: Urology | Admitting: Urology

## 2022-02-15 ENCOUNTER — Encounter: Payer: Self-pay | Admitting: Radiation Therapy

## 2022-02-15 DIAGNOSIS — D496 Neoplasm of unspecified behavior of brain: Secondary | ICD-10-CM | POA: Insufficient documentation

## 2022-02-15 NOTE — Progress Notes (Signed)
  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  Plan: The patient has completed radiation treatment. The patient will return to radiation oncology clinic for routine followup in one month. I advised them to call or return sooner if they have any questions or concerns related to their recovery or treatment. ________________________________  Sheral Apley. Tammi Klippel, M.D.

## 2022-02-15 NOTE — Progress Notes (Signed)
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  The patient was notified in advance and gave permission to proceed with this visit format.  She tolerated radiation treatment relatively well without any ill side effects.                              On review of systems, the patient states that she is doing well in general.  After completing treatment, she did experience alopecia behind her right ear which progressed to about the size of her palm and is no longer progressing.  She continues with significant right-sided otalgia and hearing loss but has recently seen some mild improvement on Neurontin which is being titrated up under the care and direction of Dr. Venetia Constable.  She has also noticed some improvement in her fatigue and overall is pleased with her progress to date.  ALLERGIES:  is allergic to gadolinium derivatives and saline.  Meds: Current Outpatient Medications  Medication Sig Dispense Refill   atorvastatin (LIPITOR) 20 MG tablet Take 1 tablet (20 mg total) by mouth daily. 90 tablet 3   cholecalciferol (VITAMIN D3) 25 MCG (1000 UNIT) tablet Take 1,000 Units by mouth daily.     diphenhydrAMINE (BENADRYL) 50 MG tablet Take 1 tablet (50 mg total) by mouth once for 1 dose. Pt to take 50 mg of benadryl on 12/29/21 at 12:00 PM. Please call [phone] with any questions. 1 tablet 0   doxylamine,  Sleep, (UNISOM) 25 MG tablet Take 25 mg by mouth at bedtime as needed for sleep.     magnesium oxide (MAG-OX) 400 MG tablet Take 400 mg by mouth daily.     nitrofurantoin, macrocrystal-monohydrate, (MACROBID) 100 MG capsule Take 1 capsule (100 mg total) by mouth 2 (two) times daily. X 7 days 14 capsule 0   Pumpkin Seed-Soy Germ (AZO BLADDER CONTROL/GO-LESS) CAPS Take 1 capsule by mouth at bedtime.     Current Facility-Administered Medications  Medication Dose Route Frequency Provider Last Rate Last Admin   medroxyPROGESTERone (DEPO-PROVERA) injection 150 mg  150 mg Intramuscular Q90 days Ronnie Doss M, DO   150 mg at 07/01/21 1415    Physical Findings:  vitals were not taken for this visit.  Pain Assessment Pain Score: 6  (RT frontal lobe of brain)/10 Unable to assess due to telephone follow-up visit format.  Lab Findings: Lab Results  Component Value Date   WBC 8.2 12/06/2021   HGB 14.2 12/06/2021   HCT 44.2 12/06/2021   MCV 89 12/06/2021   PLT 324 12/06/2021     Radiographic Findings: US PELVIC COMPLETE WITH TRANSVAGINAL  Result Date: 01/24/2022 CLINICAL DATA:  Lynch syndrome diagnosed in June of this year. Rule pathology prior to hysterectomy. LMP was 1994 secondary to Depo shot. EXAM: TRANSABDOMINAL AND TRANSVAGINAL ULTRASOUND OF PELVIS TECHNIQUE: Both transabdominal and transvaginal ultrasound examinations  of the pelvis were performed. Transabdominal technique was performed for global imaging of the pelvis including uterus, ovaries, adnexal regions, and pelvic cul-de-sac. It was necessary to proceed with endovaginal exam following the transabdominal exam to visualize the uterus, endometrium, ovaries, adnexal regions. COMPARISON:  CT of the abdomen and pelvis on 01/19/2012 FINDINGS: Uterus Measurements: 5.7 x 2.3 x 3.2 centimeters = volume: 21.7 mL. Anteflexed and normal in appearance. Endometrium Thickness: 4.3 millimeters.  No focal abnormality visualized. Right ovary  Measurements: 1.2 x 1.6 x 1.6 centimeters = volume: 1.7 mL. Normal appearance/no adnexal mass. Left ovary Measurements: 1.9 x 1.8 x 1.5 centimeters = volume: 2.7 mL. Normal appearance/no adnexal mass. Other findings No abnormal free fluid. IMPRESSION: Normal pelvic ultrasound. Electronically Signed   By: Nolon Nations M.D.   On: 01/24/2022 16:22    Impression/Plan: 1.  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 She appears to be recovering well from the effects of her recent fractionated SRS brain treatment.  The right-sided otalgia does appear to be improving with an up titration of Neurontin.  The area of hair loss behind her right ear is very gradually beginning to fill back in and her energy level is improving as well which she is pleased with.  We discussed the plan to repeat an MRI brain scan in approximately 2 months to assess her treatment response.  She understands that we will continue to participate in the review of her scans at multidisciplinary brain conference but at this point, we will plan to see her back on an as-needed basis as she will continue in routine follow-up under the care and direction of Dr. Venetia Constable.  We enjoyed taking care of her and look forward to continuing to follow her progress via correspondence.    Nicholos Johns, PA-C

## 2022-02-15 NOTE — Progress Notes (Signed)
Per Ashlyln's request, I responded to [name]'s questions around her recent patchy hair loss following the radiation treatment course. I felt the best way to answer her question was to send an e-mail including pictures to demonstrate the area we treated as well as the area of her scalp that received dose resulting in her hair loss. This area of hair loss should be temporary. Permanent hair loss can happen after a single fraction dose of 7Gy. Ms. Ghattas plan shows a scalp dose of around 6Gy total delivered over 5 fractions.   Clip of the e-mail sent::   Good morning, Laurie Kelley. Laurie Kelley reached out about some questions you have regarding the dose and beams during your treatment resulting in patchy hair loss. I thought a picture may be the best way to answer. Below is the e-mail thread I sent to the physicists when the original concern of hair loss was mentioned. Their reply included a picture of the treatment plan and dose lines with a surface structure of your head where the dose would have gone to your scalp. Based on the pictures below the palm sized hair loss just behind your Rt ear, would be explained. This area of hair loss should not be permanent. Please let me know if this helps or if you have more questions.   Thank you, Laurie Kelley  [phone] ------------------------------------------- Reply from the planning team::  Looks like that area of the scalp received about 6Gy total over the five fractions. The area presented in the patient photograph below received the highest dose to the scalp but depending on the sensitivity of the hair follicles its possible it may continue towards the back of her head.   ----------------------------------------------------------------------------------------------- Original message   I received a message from [name], DOB 04/05/1971, concerned about hair loss behind her Rt ear. What was her scalp dose, and should this be the only area to expect the patch  of hair loss? I let her know that patchy hair loss is common but not permanent with this type of treatment course. She was relieved but concerned that the patch would get larger.

## 2022-02-16 ENCOUNTER — Other Ambulatory Visit: Payer: Self-pay | Admitting: Radiation Therapy

## 2022-02-16 DIAGNOSIS — D329 Benign neoplasm of meninges, unspecified: Secondary | ICD-10-CM

## 2022-02-17 ENCOUNTER — Other Ambulatory Visit: Payer: Self-pay | Admitting: Urology

## 2022-02-17 MED ORDER — PREDNISONE 50 MG PO TABS: ORAL_TABLET | ORAL | 0 refills | Status: DC

## 2022-02-19 ENCOUNTER — Other Ambulatory Visit: Payer: Self-pay | Admitting: Family Medicine

## 2022-02-19 IMAGING — MR MR ORBITS WO/W CM
6 of 18 series · 20 of 48 positions shown · IV contrast (Yes GAD)
Comparison: Head CT 11/25/2013

CLINICAL DATA: Vision loss, monocular; Brain/CNS neoplasm, staging.
Left-sided vision loss. Right eye bulging. Headaches.

EXAM:
MRI HEAD AND ORBITS WITHOUT AND WITH CONTRAST
TECHNIQUE: Multiplanar, multiecho pulse sequences of the brain and surrounding
structures were obtained without and with intravenous contrast.
Multiplanar, multiecho pulse sequences of the orbits and surrounding
structures were obtained including fat saturation techniques, before
and after intravenous contrast administration.
CONTRAST:  7.5mL GADAVIST GADOBUTROL 1 MMOL/ML IV SOLN

[Series 2: DWI · axial · 3.0mm · 0.94mm/px · z∈[-75,+71]mm · 7 of 100 slices shown (1 of 2)]
[im 1/100]
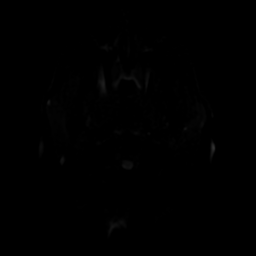
[im 17/100]
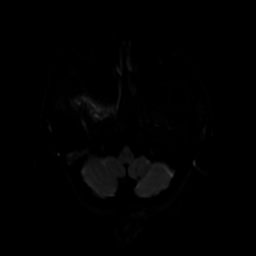
[im 34/100]
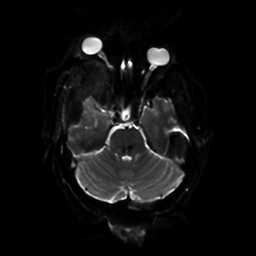
[im 50/100]
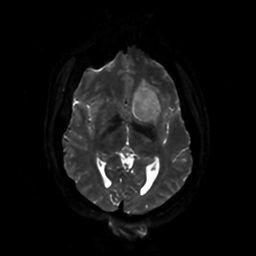
[im 67/100]
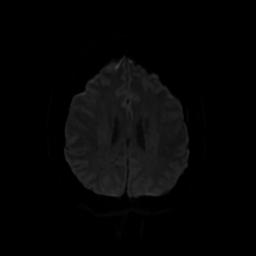
[im 83/100]
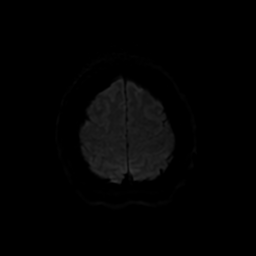
[im 100/100]
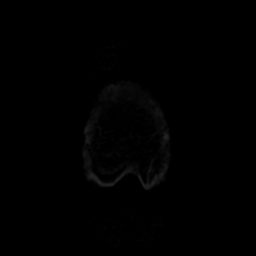

[Series 3: DWI · coronal · 4.0mm · 0.94mm/px · 4 of 66 slices shown (2 of 2)]
[im 1/66]
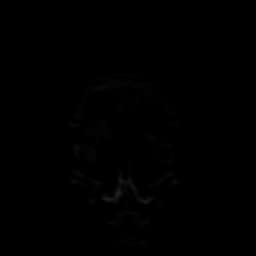
[im 22/66]
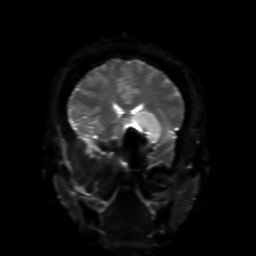
[im 44/66]
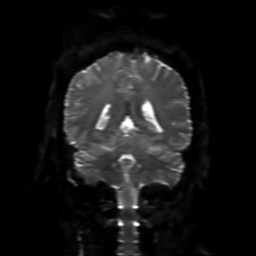
[im 66/66]
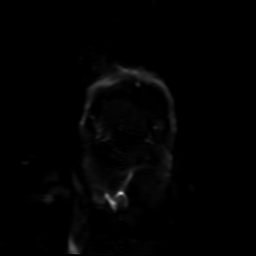

[Series 4: FLAIR · sagittal · 5.0mm · 0.23mm/px · 2 of 23 slices shown (1 of 2)]
[im 1/23]
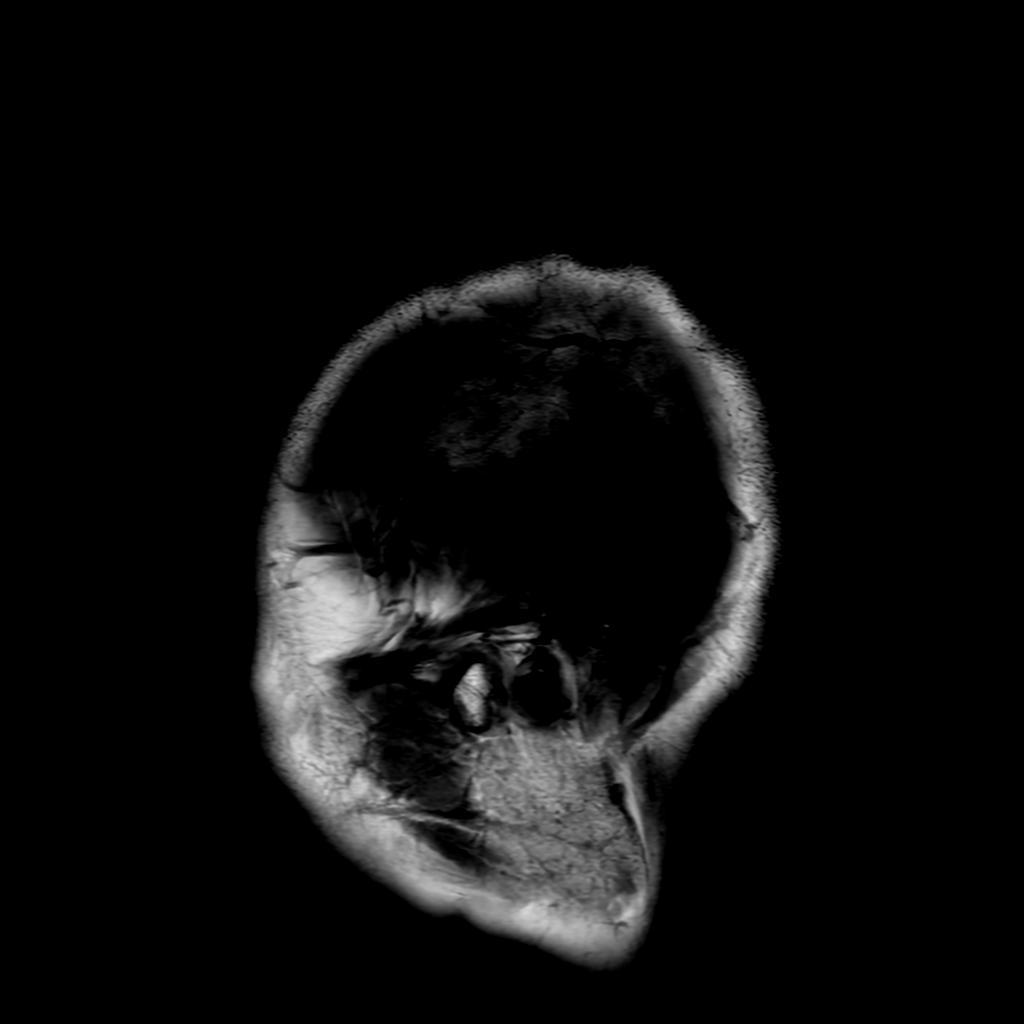
[im 23/23]
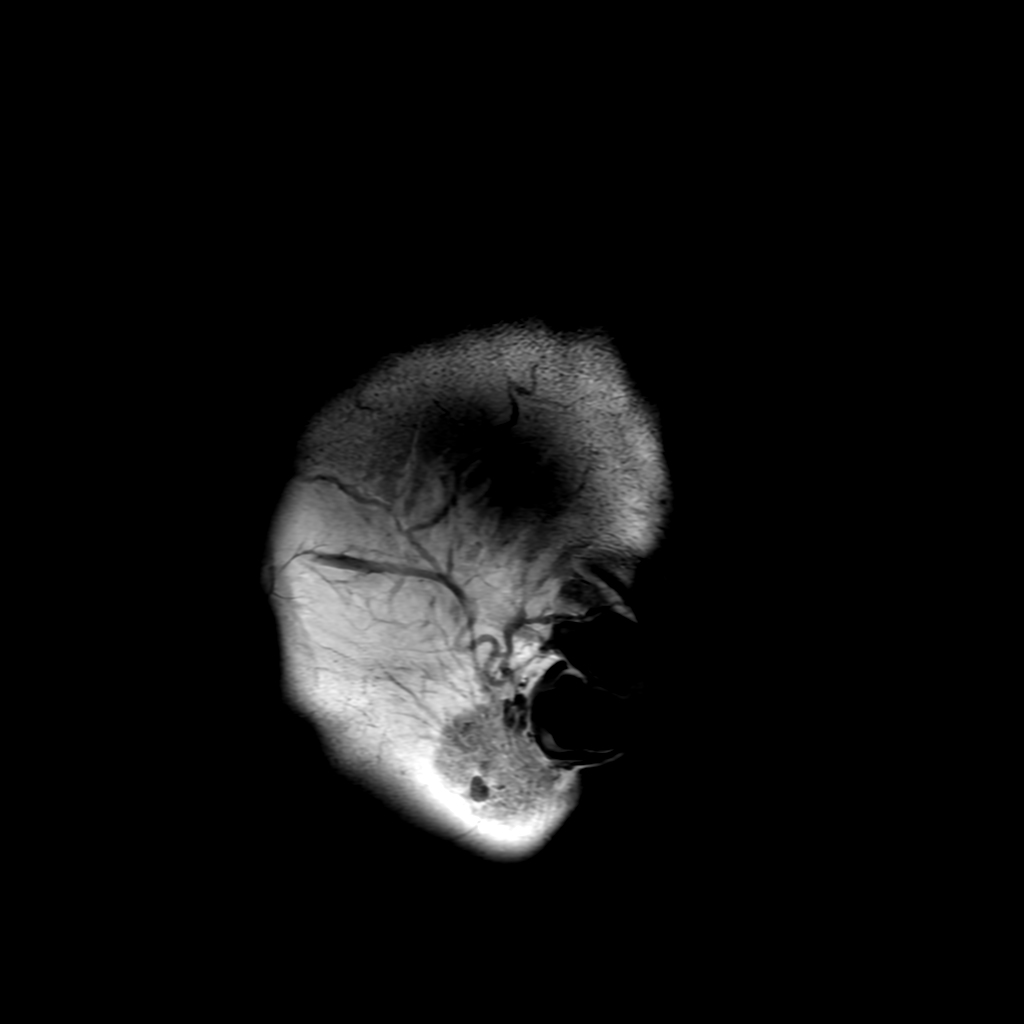

[Series 5: FLAIR · axial · 4.0mm · 0.45mm/px · z∈[-75,+70]mm · 2 of 34 slices shown (2 of 2)]
[im 1/34]
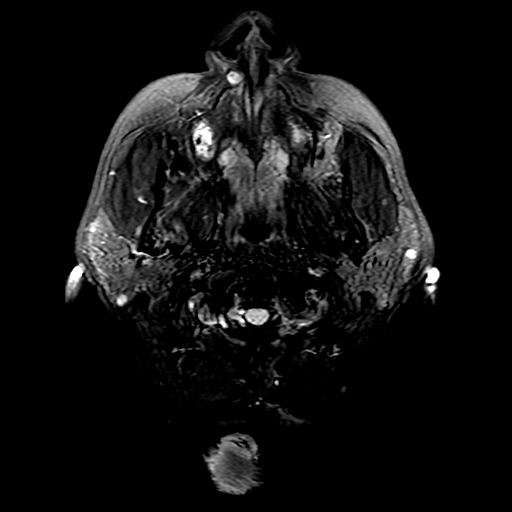
[im 34/34]
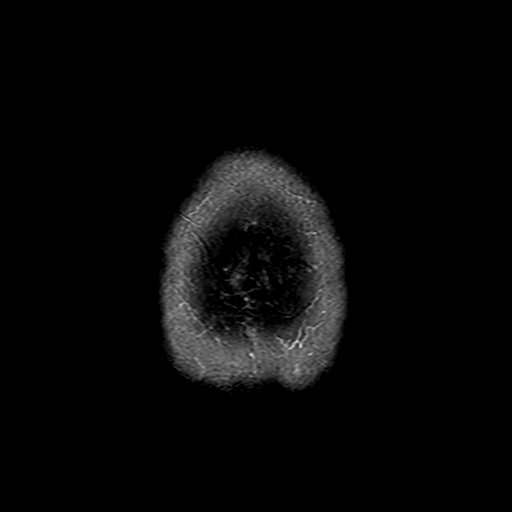

[Series 250: ADC · axial · 3.0mm · 0.94mm/px · z∈[-75,+71]mm · 3 of 50 slices shown (1 of 2)]
[im 1/50]
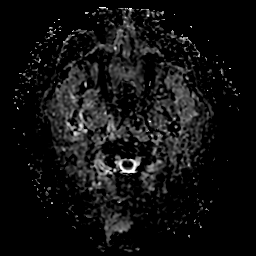
[im 25/50]
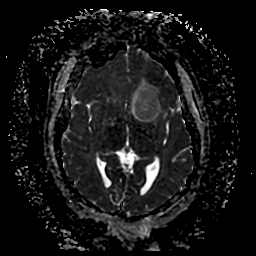
[im 50/50]
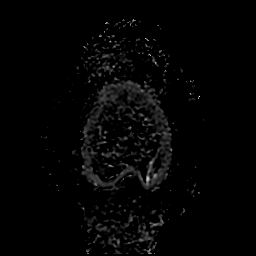

[Series 350: ADC · coronal · 4.0mm · 0.94mm/px · 2 of 33 slices shown (2 of 2)]
[im 1/33]
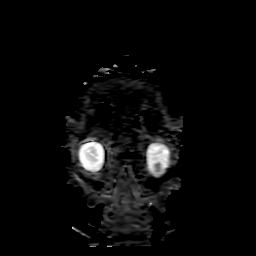
[im 33/33]
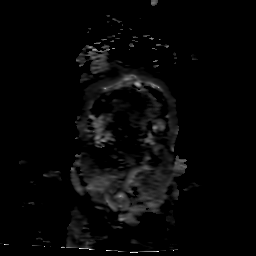

[20 of 48 positions shown; findings below may reference images not displayed]

FINDINGS: MRI HEAD FINDINGS

Brain: There is an avidly enhancing extra-axial mass projecting
superiorly from the left anterior clinoid process measuring 3.0 x
3.3 x 2.7 cm which results in mass effect on the left frontal lobe
and basal ganglia with associated mild vasogenic edema. This is
contiguous with mild dural enhancement which extends laterally along
the left sphenoid wing into the middle cranial fossa as well as
thicker, more nodular extra-axial/dural based enhancing soft tissue
extending medially to the planum sphenoidale with mass effect on the
prechiasmatic left optic nerve. Enhancing extra-axial soft tissue
extends across the midline at the level of the planum
sphenoidale/tuberculum sellae with a discrete 8 x 9 mm enhancing
mass projecting superiorly from the right lateral aspect of the
planum sphenoidale with mild mass effect on the inferior right
frontal lobe without associated edema.

There is a prominent bony expansion involving essentially the
entirety of the sphenoid bone on the right (body, lesser and greater
wings, and pterygoid process) with associated dural thickening and
nodularity in the right middle cranial fossa. Enhancing soft tissue
extends into the right cavernous sinus as well as through multiple
skull base foramina including foramen rotundum and foramen ovale on
the right. Prominent osseous expansion of the lateral wall of the
right orbit encroaches upon the orbital apex with overlying abnormal
soft tissue enhancement diffusely along the lateral extraconal right
orbit. There is also extensive osseous expansion/infiltration of the
right temporal bone including squamosal, petromastoid, and zygomatic
portions with abnormal enhancement extending into the soft tissues
of the right infratemporal fossa. There is mild edema in the right
temporal lobe.

There is no evidence of an acute infarct, extra-axial fluid
collection, intracranial hemorrhage, or significant midline shift.
Cerebral volume is normal. No significant chronic white matter
disease is evident.

Vascular: Major intracranial vascular flow voids are preserved. Mass
effect on the left MCA by the left anterior clinoid mass.

Skull and upper cervical spine: Abnormal right skull base as above.

Other: None.

MRI ORBITS FINDINGS

Orbits: Bony encroachment on the right orbit as noted above with
right-sided proptosis. Mass effect on both pre chiasmatic optic
nerves. No intraorbital optic nerve edema.

Visualized sinuses: Minimal mucosal thickening in the right
maxillary sinus. Large right mastoid effusion.

Soft tissues: No additional findings.
IMPRESSION: Extensive spreading and infiltrating mass/multiple masses involving
the central skull base most suggestive of meningiomas with
metastatic disease an alternative consideration. Extensive osseous
involvement of the right sphenoid and temporal bones and lateral
right orbit.

## 2022-02-23 ENCOUNTER — Encounter: Payer: Self-pay | Admitting: Obstetrics & Gynecology

## 2022-02-23 ENCOUNTER — Ambulatory Visit (INDEPENDENT_AMBULATORY_CARE_PROVIDER_SITE_OTHER): Payer: Commercial Managed Care - PPO | Admitting: Obstetrics & Gynecology

## 2022-02-23 VITALS — BP 126/76 | HR 83 | Ht 65.0 in | Wt 175.6 lb

## 2022-02-23 DIAGNOSIS — Z299 Encounter for prophylactic measures, unspecified: Secondary | ICD-10-CM

## 2022-02-23 DIAGNOSIS — Z8603 Personal history of neoplasm of uncertain behavior: Secondary | ICD-10-CM

## 2022-02-23 DIAGNOSIS — Z01818 Encounter for other preprocedural examination: Secondary | ICD-10-CM

## 2022-02-23 DIAGNOSIS — Z9889 Other specified postprocedural states: Secondary | ICD-10-CM

## 2022-02-23 DIAGNOSIS — Z1509 Genetic susceptibility to other malignant neoplasm: Secondary | ICD-10-CM

## 2022-02-23 NOTE — Progress Notes (Signed)
   GYN VISIT Patient name: Laurie Kelley MRN [id number]  Date of birth: December 06, 1970 Chief Complaint:   Pre-op Exam  History of Present Illness:   Laurie Kelley is a 51 y.o. G82P0 female being seen today for preop exam due to upcoming TAH,BSO due to Lynch syndrome.     No menses due to [place].  Started on gabapentin otherwise, no changes since her last visit.  Denies pelvic or abdominal pain.  No acute gyn concerns.   No LMP recorded. Patient has had an injection.     01/23/2022    8:41 AM 12/06/2021   11:17 AM 07/26/2021    8:35 AM 08/02/2020   10:17 AM 02/12/2019    4:27 PM  Depression screen PHQ 2/9  Decreased Interest 0 0 0 0 0  Down, Depressed, Hopeless 0 0 0 0 0  PHQ - 2 Score 0 0 0 0 0  Altered sleeping 1   0 0  Tired, decreased energy 1   0 0  Change in appetite 0   0 0  Feeling bad or failure about yourself  0   0 0  Trouble concentrating 0   0 0  Moving slowly or fidgety/restless 0   0 0  Suicidal thoughts 0   0 0  PHQ-9 Score 2   0 0     Review of Systems:   Pertinent items are noted in HPI Denies fever/chills, dizziness, headaches, visual disturbances, fatigue, shortness of breath, chest pain, abdominal pain, vomiting, no problems with periods, bowel movements, urination, or intercourse unless otherwise stated above.  Pertinent History Reviewed:  Reviewed past medical,surgical, social, obstetrical and family history.  Reviewed problem list, medications and allergies. Physical Assessment:   Vitals:   02/23/22 0951  BP: 126/76  Pulse: 83  Weight: 175 lb 9.6 oz (79.7 kg)  Height: '5\' 5"'$  (1.651 m)  Body mass index is 29.22 kg/m.       Physical Examination:   General appearance: alert, well appearing, and in no distress  Psych: mood appropriate, normal affect  Skin: warm & dry   Cardiovascular: RRR  Respiratory: CTAB  Abdomen: soft, non-tender   Pelvic: exam declined by the patient  Extremities: no edema   Chaperone: N/A    Pelvic US reviewed: 5.7 x 2.3  x 3.2 centimeters = volume: 21.7 mL. Anteflexed and normal in appearance.  Normal ovaries bilaterally.  Assessment & Plan:  1) Preop exam for upcoming gyn surgery, Lynch syndrome -reviewed risk/benefits of surgery including but not limited to risk of bleeding, infection and injury to surrounding organs -discussed hospital expectations- desires same day discharge -reviewed route of procedure- dicussed LAVH vs TAH- due to concern for increase intracranial pressure- plan for TAH to avoid Trendelenburg -questions and concerns were addressed -scheduled for TAH, BSO on Sept 20 -preop labs to be completed   1wk postop scheduled   Janyth Pupa, DO Attending [place], [place] for [employer], [place]

## 2022-03-02 NOTE — Patient Instructions (Signed)
Laurie Kelley  03/02/2022     '@PREFPERIOPPHARMACY'$ @   Your procedure is scheduled on  03/08/2022.   Report to [facility] at  0700 A.M.   Call this number if you have problems the morning of surgery:  [phone]   Remember:  Do not eat or drink after midnight.      Take these medicines the morning of surgery with A SIP OF WATER                                              Neurontin, claritin.     Do not wear jewelry, make-up or nail polish.  Do not wear lotions, powders, or perfumes, or deodorant.  Do not shave 48 hours prior to surgery.  Men may shave face and neck.  Do not bring valuables to the hospital.  [facility] is not responsible for any belongings or valuables.  Contacts, dentures or bridgework may not be worn into surgery.  Leave your suitcase in the car.  After surgery it may be brought to your room.  For patients admitted to the hospital, discharge time will be determined by your treatment team.  Patients discharged the day of surgery will not be allowed to drive home and must have someone with them for 24 hours.     Special instructions:   DO NOT smoke tobacco or vape for 24 hours before your procedure.   Please read over the following fact sheets that you were given. Pain Booklet, Coughing and Deep Breathing, Blood Transfusion Information, Surgical Site Infection Prevention, Anesthesia Post-op Instructions, and Care and Recovery After Surgery      Abdominal Hysterectomy, Care After The following information offers guidance on how to care for yourself after your procedure. Your doctor may also give you more specific instructions. If you have problems or questions, contact your doctor. What can I expect after the procedure? After the procedure, it is common to have: Pain. Tiredness. No desire to eat. Less interest in sex. Bleeding and fluid (discharge) from your vagina. You may need to use a pad after this procedure. Trouble pooping  (constipation). Feelings of sadness or other emotions. Follow these instructions at home: Medicines Take over-the-counter and prescription medicines only as told by your doctor. If you were prescribed an antibiotic medicine, take it as told by your doctor. Do not stop using the antibiotic even if you start to feel better. If told, take steps to prevent problems with pooping (constipation). You may need to: Drink enough fluid to keep your pee (urine) pale yellow. Take medicines. You will be told what medicines to take. Eat foods that are high in fiber. These include beans, whole grains, and fresh fruits and vegetables. Limit foods that are high in fat and sugar. These include fried or sweet foods. Ask your doctor if you should avoid driving or using machines while you are taking your medicine. Surgical cut care  Follow instructions from your doctor about how to take care of your cut from surgery (incision). Make sure you: Wash your hands with soap and water for at least 20 seconds before and after you change your bandage. If you cannot use soap and water, use hand sanitizer. Change your bandage. Leave stitches or skin glue in place for at least two weeks. Leave tape strips alone unless you are  told to take them off. You may trim the edges of the tape strips if they curl up. Keep the bandage dry until your doctor says it can be taken off. Check your incision every day for signs of infection. Check for: More redness, swelling, or pain. Fluid or blood. Warmth. Pus or a bad smell. Activity  Rest as told by your doctor. Get up to take short walks every 1 to 2 hours. Ask for help if you feel weak or unsteady. Do not lift anything that is heavier than 10 lb (4.5 kg), or the limit that you are told. Follow your doctor's advice about exercise, driving, and general activities. Return to your normal activities when your doctor says that it is safe. Lifestyle Do not douche, use tampons, or have  sex for at least 6 weeks or as told by your doctor. Do not drink alcohol until your doctor says it is okay. Do not smoke or use any products that contain nicotine or tobacco. These can delay healing after surgery. If you need help quitting, ask your doctor. General instructions Do not take baths, swim, or use a hot tub. Ask your doctor about taking showers or sponge baths. Try to have a responsible adult at home with you for the first 1-2 weeks to help with your daily chores. Wear tight-fitting (compression) stockings as told by your doctor. Keep all follow-up visits. Contact a doctor if: You have chills or a fever. You have any of these signs of infection around your cut: More redness, swelling, or pain. Fluid or blood. Warmth. Pus or a bad smell. Your cut breaks open. You feel dizzy or light-headed. You have pain or bleeding when you pee. You keep having watery poop (diarrhea). You keep feeling like you may vomit or you keep vomiting. You have fluid coming from your vagina that is not normal. You have any type of reaction to your medicine that is not normal, like a rash, or you develop an allergy to your medicine. Your pain medicine does not help. Get help right away if: You have a fever and your symptoms get worse suddenly. You have very bad pain in your belly (abdomen). You are short of breath. You faint. You have pain, swelling, or redness of your leg. You bleed a lot from your vagina and you see blood clots. Summary It is normal to have some pain, tiredness, and fluid that comes from your vagina. Do not take baths, swim, or use a hot tub. Ask your doctor about taking showers or sponge baths. Do not lift anything that is heavier than 10 lb (4.5 kg), or the limit that you are told. Follow your doctor's advice about exercise, driving, and general activities. Try to have a responsible adult at home with you for the first 1-2 weeks to help with your daily chores. This information  is not intended to replace advice given to you by your health care provider. Make sure you discuss any questions you have with your health care provider. Document Revised: 08/13/2020 Document Reviewed: 02/05/2020 Elsevier Patient Education  [place] Anesthesia, Adult, Care After The following information offers guidance on how to care for yourself after your procedure. Your health care provider may also give you more specific instructions. If you have problems or questions, contact your health care provider. What can I expect after the procedure? After the procedure, it is common for people to: Have pain or discomfort at the IV site. Have nausea or vomiting. Have a  sore throat or hoarseness. Have trouble concentrating. Feel cold or chills. Feel weak, sleepy, or tired (fatigue). Have soreness and body aches. These can affect parts of the body that were not involved in surgery. Follow these instructions at home: For the time period you were told by your health care provider:  Rest. Do not participate in activities where you could fall or become injured. Do not drive or use machinery. Do not drink alcohol. Do not take sleeping pills or medicines that cause drowsiness. Do not make important decisions or sign legal documents. Do not take care of children on your own. General instructions Drink enough fluid to keep your urine pale yellow. If you have sleep apnea, surgery and certain medicines can increase your risk for breathing problems. Follow instructions from your health care provider about wearing your sleep device: Anytime you are sleeping, including during daytime naps. While taking prescription pain medicines, sleeping medicines, or medicines that make you drowsy. Return to your normal activities as told by your health care provider. Ask your health care provider what activities are safe for you. Take over-the-counter and prescription medicines only as told by your  health care provider. Do not use any products that contain nicotine or tobacco. These products include cigarettes, chewing tobacco, and vaping devices, such as e-cigarettes. These can delay incision healing after surgery. If you need help quitting, ask your health care provider. Contact a health care provider if: You have nausea or vomiting that does not get better with medicine. You vomit every time you eat or drink. You have pain that does not get better with medicine. You cannot urinate or have bloody urine. You develop a skin rash. You have a fever. Get help right away if: You have trouble breathing. You have chest pain. You vomit blood. These symptoms may be an emergency. Get help right away. Call 911. Do not wait to see if the symptoms will go away. Do not drive yourself to the hospital. Summary After the procedure, it is common to have a sore throat, hoarseness, nausea, vomiting, or to feel weak, sleepy, or fatigue. For the time period you were told by your health care provider, do not drive or use machinery. Get help right away if you have difficulty breathing, have chest pain, or vomit blood. These symptoms may be an emergency. This information is not intended to replace advice given to you by your health care provider. Make sure you discuss any questions you have with your health care provider. Document Revised: 09/02/2021 Document Reviewed: 09/02/2021 Elsevier Patient Education  [place]. How to Use Chlorhexidine Before Surgery Chlorhexidine gluconate (CHG) is a germ-killing (antiseptic) solution that is used to clean the skin. It can get rid of the bacteria that normally live on the skin and can keep them away for about 24 hours. To clean your skin with CHG, you may be given: A CHG solution to use in the shower or as part of a sponge bath. A prepackaged cloth that contains CHG. Cleaning your skin with CHG may help lower the risk for infection: While you are staying  in the intensive care unit of the hospital. If you have a vascular access, such as a central line, to provide short-term or long-term access to your veins. If you have a catheter to drain urine from your bladder. If you are on a ventilator. A ventilator is a machine that helps you breathe by moving air in and out of your lungs. After surgery. What are the  risks? Risks of using CHG include: A skin reaction. Hearing loss, if CHG gets in your ears and you have a perforated eardrum. Eye injury, if CHG gets in your eyes and is not rinsed out. The CHG product catching fire. Make sure that you avoid smoking and flames after applying CHG to your skin. Do not use CHG: If you have a chlorhexidine allergy or have previously reacted to chlorhexidine. On babies younger than 20 months of age. How to use CHG solution Use CHG only as told by your health care provider, and follow the instructions on the label. Use the full amount of CHG as directed. Usually, this is one bottle. During a shower Follow these steps when using CHG solution during a shower (unless your health care provider gives you different instructions): Start the shower. Use your normal soap and shampoo to wash your face and hair. Turn off the shower or move out of the shower stream. Pour the CHG onto a clean washcloth. Do not use any type of brush or rough-edged sponge. Starting at your neck, lather your body down to your toes. Make sure you follow these instructions: If you will be having surgery, pay special attention to the part of your body where you will be having surgery. Scrub this area for at least 1 minute. Do not use CHG on your head or face. If the solution gets into your ears or eyes, rinse them well with water. Avoid your genital area. Avoid any areas of skin that have broken skin, cuts, or scrapes. Scrub your back and under your arms. Make sure to wash skin folds. Let the lather sit on your skin for 1-2 minutes or as long  as told by your health care provider. Thoroughly rinse your entire body in the shower. Make sure that all body creases and crevices are rinsed well. Dry off with a clean towel. Do not put any substances on your body afterward--such as powder, lotion, or perfume--unless you are told to do so by your health care provider. Only use lotions that are recommended by the manufacturer. Put on clean clothes or pajamas. If it is the night before your surgery, sleep in clean sheets.  During a sponge bath Follow these steps when using CHG solution during a sponge bath (unless your health care provider gives you different instructions): Use your normal soap and shampoo to wash your face and hair. Pour the CHG onto a clean washcloth. Starting at your neck, lather your body down to your toes. Make sure you follow these instructions: If you will be having surgery, pay special attention to the part of your body where you will be having surgery. Scrub this area for at least 1 minute. Do not use CHG on your head or face. If the solution gets into your ears or eyes, rinse them well with water. Avoid your genital area. Avoid any areas of skin that have broken skin, cuts, or scrapes. Scrub your back and under your arms. Make sure to wash skin folds. Let the lather sit on your skin for 1-2 minutes or as long as told by your health care provider. Using a different clean, wet washcloth, thoroughly rinse your entire body. Make sure that all body creases and crevices are rinsed well. Dry off with a clean towel. Do not put any substances on your body afterward--such as powder, lotion, or perfume--unless you are told to do so by your health care provider. Only use lotions that are recommended by the manufacturer. Put  on clean clothes or pajamas. If it is the night before your surgery, sleep in clean sheets. How to use CHG prepackaged cloths Only use CHG cloths as told by your health care provider, and follow the  instructions on the label. Use the CHG cloth on clean, dry skin. Do not use the CHG cloth on your head or face unless your health care provider tells you to. When washing with the CHG cloth: Avoid your genital area. Avoid any areas of skin that have broken skin, cuts, or scrapes. Before surgery Follow these steps when using a CHG cloth to clean before surgery (unless your health care provider gives you different instructions): Using the CHG cloth, vigorously scrub the part of your body where you will be having surgery. Scrub using a back-and-forth motion for 3 minutes. The area on your body should be completely wet with CHG when you are done scrubbing. Do not rinse. Discard the cloth and let the area air-dry. Do not put any substances on the area afterward, such as powder, lotion, or perfume. Put on clean clothes or pajamas. If it is the night before your surgery, sleep in clean sheets.  For general bathing Follow these steps when using CHG cloths for general bathing (unless your health care provider gives you different instructions). Use a separate CHG cloth for each area of your body. Make sure you wash between any folds of skin and between your fingers and toes. Wash your body in the following order, switching to a new cloth after each step: The front of your neck, shoulders, and chest. Both of your arms, under your arms, and your hands. Your stomach and groin area, avoiding the genitals. Your right leg and foot. Your left leg and foot. The back of your neck, your back, and your buttocks. Do not rinse. Discard the cloth and let the area air-dry. Do not put any substances on your body afterward--such as powder, lotion, or perfume--unless you are told to do so by your health care provider. Only use lotions that are recommended by the manufacturer. Put on clean clothes or pajamas. Contact a health care provider if: Your skin gets irritated after scrubbing. You have questions about using  your solution or cloth. You swallow any chlorhexidine. Call your local poison control center (1-(540)525-5669 in the [place] Get help right away if: Your eyes itch badly, or they become very red or swollen. Your skin itches badly and is red or swollen. Your hearing changes. You have trouble seeing. You have swelling or tingling in your mouth or throat. You have trouble breathing. These symptoms may represent a serious problem that is an emergency. Do not wait to see if the symptoms will go away. Get medical help right away. Call your local emergency services (911 in the [place] Do not drive yourself to the hospital. Summary Chlorhexidine gluconate (CHG) is a germ-killing (antiseptic) solution that is used to clean the skin. Cleaning your skin with CHG may help to lower your risk for infection. You may be given CHG to use for bathing. It may be in a bottle or in a prepackaged cloth to use on your skin. Carefully follow your health care provider's instructions and the instructions on the product label. Do not use CHG if you have a chlorhexidine allergy. Contact your health care provider if your skin gets irritated after scrubbing. This information is not intended to replace advice given to you by your health care provider. Make sure you discuss any questions you have with  your health care provider. Document Revised: 10/03/2021 Document Reviewed: 08/16/2020 Elsevier Patient Education  [place].

## 2022-03-05 NOTE — H&P (Signed)
[facility] Obstetrics and Gynecology Attending History and Physical  Embden is a 51 y.o. G2P0 who presents for scheduled total abdominal hysterectomy and bilateral salping-oophorectomy due to Lynch syndrome.  In review, recent genetic testing showed that she was + for Lynch syndrome.  As per recommendations, once childbearing has been completed, due to risk of uterine and ovarian cancer, surgical intervention should be completed.  She has been on Depot for over 30 yrs and does not have a period with this form of contraception.  Reports no acute complaints or changes since her last visit.  Due to her prior cranial surgeries, plan to avoid change in intracranial pressure via Trendelenburg.  Denies any abnormal vaginal discharge, fevers, chills, sweats, dysuria, nausea, vomiting, other GI or GU symptoms or other general symptoms.  Past Medical History:  Diagnosis Date   Cancer [facility])    Chest pain in adult 07/31/2017   Esophageal reflux 08/18/2014   Family history of brain tumor    Gallstones    Headache    Hyperlipidemia with target LDL less than 100 08/18/2015   Internal hemorrhoids 08/18/2014   PONV (postoperative nausea and vomiting)    Past Surgical History:  Procedure Laterality Date   APPLICATION OF CRANIAL NAVIGATION Left 02/18/2021   Procedure: APPLICATION OF CRANIAL NAVIGATION;  Surgeon: Judith Part, MD;  Location: [place];  Service: Neurosurgery;  Laterality: Left;   APPLICATION OF CRANIAL NAVIGATION N/A 05/18/2021   Procedure: APPLICATION OF CRANIAL NAVIGATION;  Surgeon: Judith Part, MD;  Location: [place];  Service: Neurosurgery;  Laterality: N/A;   BRAIN SURGERY     CHOLECYSTECTOMY  1993   CRANIOTOMY Left 02/18/2021   Procedure: Left orbitozygomatic craniotomy for tumor resection  with Brain Lab;  Surgeon: Judith Part, MD;  Location: [place];  Service: Neurosurgery;  Laterality: Left;   CRANIOTOMY Right 05/18/2021   Procedure: Right  Orbitozygomatic craniotomy for tumor with brainlab;  Surgeon: Judith Part, MD;  Location: [place];  Service: Neurosurgery;  Laterality: Right;   SHOULDER ARTHROSCOPY Right 11/06/2019   Procedure: ARTHROSCOPY RIGHT SHOULDER, DEBRIDEMENT X TWO STRUCTURES; OPEN ROTATOR CUFF REPAIR;  Surgeon: Carole Civil, MD;  Location: [facility];  Service: Orthopedics;  Laterality: Right;   OB History  Gravida Para Term Preterm AB Living  2         2  SAB IAB Ectopic Multiple Live Births               # Outcome Date GA Lbr Len/2nd Weight Sex Delivery Anes PTL Lv  2 Gravida      Vag-Spont     1 Gravida      Vag-Spont     Patient denies any other pertinent gynecologic issues.  Current Facility-Administered Medications on File Prior to Encounter  Medication Dose Route Frequency Provider Last Rate Last Admin   medroxyPROGESTERone (DEPO-PROVERA) injection 150 mg  150 mg Intramuscular Q90 days Gottschalk, Ashly M, DO   150 mg at 07/01/21 1415   Current Outpatient Medications on File Prior to Encounter  Medication Sig Dispense Refill   atorvastatin (LIPITOR) 20 MG tablet Take 1 tablet (20 mg total) by mouth daily. 90 tablet 3   Cholecalciferol (DIALYVITE VITAMIN D 5000) 125 MCG (5000 UT) capsule Take 5,000 Units by mouth daily.     hydrocortisone cream 1 % Apply 1 Application topically daily as needed for itching.     loratadine-pseudoephedrine (CLARITIN-D 24-HOUR) 10-240 MG 24 hr tablet Take 1 tablet by mouth  daily as needed for allergies.     magnesium oxide (MAG-OX) 400 MG tablet Take 400 mg by mouth daily.     Pumpkin Seed-Soy Germ (AZO BLADDER CONTROL/GO-LESS) CAPS Take 1 capsule by mouth at bedtime.     diphenhydrAMINE (BENADRYL) 50 MG tablet Take 1 tablet (50 mg total) by mouth once for 1 dose. Pt to take 50 mg of benadryl on 12/29/21 at 12:00 PM. Please call [phone] with any questions. 1 tablet 0   Allergies  Allergen Reactions   Gadolinium Derivatives Other (See Comments)    Pt reports  she developed chest tightness, burning of her back, and weakness after MRI contrast 11/2021. Per Dr. Jobe Igo, he suggests 13 hr protocol prior to MRI.    Saline Nausea And Vomiting    Social History:   reports that she has never smoked. She has never used smokeless tobacco. She reports that she does not drink alcohol and does not use drugs. Family History  Problem Relation Age of Onset   Lung cancer Paternal Grandmother        d.85   Heart disease Father    Hyperlipidemia Mother    Other Sister        meningiomas/tumors x11   Other Daughter        Lynch syndrome   Brain cancer Maternal Aunt        d. 79s-60s   Lung cancer Maternal Aunt    Bone cancer Maternal Aunt        d. 50s-60s   Liver cancer Paternal Uncle    Colon cancer Neg Hx    Rectal cancer Neg Hx    Esophageal cancer Neg Hx    Stomach cancer Neg Hx    Breast cancer Neg Hx     Review of Systems: Pertinent items noted in HPI and remainder of comprehensive ROS otherwise negative.  PHYSICAL EXAM: VS pending CONSTITUTIONAL: Well-developed, well-nourished female in no acute distress.  SKIN: Skin is warm and dry. No rash noted. Not diaphoretic. No erythema. No pallor. NEUROLOGIC: Alert and oriented to person, place, and time. Normal reflexes, muscle tone coordination. No cranial nerve deficit noted. PSYCHIATRIC: Normal mood and affect. Normal behavior. Normal judgment and thought content. CARDIOVASCULAR: Normal heart rate noted, regular rhythm RESPIRATORY: Effort and breath sounds normal, no problems with respiration noted ABDOMEN: Soft, nontender, nondistended. PELVIC: deferred MUSCULOSKELETAL: no calf tenderness bilaterally EXT: no edema bilaterally, normal pulses  Imaging Studies:   Uterus- Measurements: 5.7 x 2.3 x 3.2 centimeters = volume: 21.7 mL. Anteflexed and normal in appearance.  Normal ovaries bilaterally.  Assessment: Lynch syndrome   Plan: Total abdominal hysterectomy and bilateral  salping-oophorectomy -Ancef 2g IV -Toradol preop -NPO -LR @ 125cc/hr -SCDs to OR -Risk/benefits and alternatives reviewed with the patient including but not limited to risk of bleeding, infection and injury to surrounding organs.  Questions and concerns were addressed and pt desires to proceed  Janyth Pupa, DO Attending [place], [facility] for [facility], [place]

## 2022-03-06 ENCOUNTER — Other Ambulatory Visit: Payer: Self-pay

## 2022-03-06 ENCOUNTER — Encounter (HOSPITAL_COMMUNITY): Payer: Self-pay

## 2022-03-06 ENCOUNTER — Encounter (HOSPITAL_COMMUNITY)
Admission: RE | Admit: 2022-03-06 | Discharge: 2022-03-06 | Disposition: A | Discharge status: Home / Self Care | Payer: Commercial Managed Care - PPO | Source: Ambulatory Visit | Attending: Obstetrics & Gynecology | Admitting: Obstetrics & Gynecology

## 2022-03-06 VITALS — BP 122/72 | HR 103 | Temp 97.8°F | Resp 18 | Ht 65.0 in | Wt 175.6 lb

## 2022-03-06 DIAGNOSIS — Z01812 Encounter for preprocedural laboratory examination: Secondary | ICD-10-CM | POA: Insufficient documentation

## 2022-03-06 DIAGNOSIS — Z1509 Genetic susceptibility to other malignant neoplasm: Secondary | ICD-10-CM | POA: Diagnosis not present

## 2022-03-06 DIAGNOSIS — Z01818 Encounter for other preprocedural examination: Secondary | ICD-10-CM

## 2022-03-06 HISTORY — DX: Genetic susceptibility to other malignant neoplasm of digestive system: Z15.09

## 2022-03-06 HISTORY — DX: Benign neoplasm of brain, unspecified: D33.2

## 2022-03-06 HISTORY — DX: Genetic susceptibility to other malignant neoplasm: Z15.09

## 2022-03-06 HISTORY — DX: Genetic susceptibility to malignant neoplasm of urinary tract: Z15.07

## 2022-03-06 LAB — TYPE AND SCREEN
ABO/RH(D): B POS
Antibody Screen: NEGATIVE

## 2022-03-06 LAB — CBC
HCT: 41.1 % (ref 36.0–46.0)
Hemoglobin: 13.6 g/dL (ref 12.0–15.0)
MCH: 29.6 pg (ref 26.0–34.0)
MCHC: 33.1 g/dL (ref 30.0–36.0)
MCV: 89.3 fL (ref 80.0–100.0)
Platelets: 283 10*3/uL (ref 150–400)
RBC: 4.6 MIL/uL (ref 3.87–5.11)
RDW: 12.9 % (ref 11.5–15.5)
WBC: 4.4 10*3/uL (ref 4.0–10.5)
nRBC: 0 % (ref 0.0–0.2)

## 2022-03-06 LAB — COMPREHENSIVE METABOLIC PANEL
ALT: 13 U/L (ref 0–44)
AST: 11 U/L — ABNORMAL LOW (ref 15–41)
Albumin: 4.1 g/dL (ref 3.5–5.0)
Alkaline Phosphatase: 172 U/L — ABNORMAL HIGH (ref 38–126)
Anion gap: 2 — ABNORMAL LOW (ref 5–15)
BUN: 11 mg/dL (ref 6–20)
CO2: 23 mmol/L (ref 22–32)
Calcium: 10.8 mg/dL — ABNORMAL HIGH (ref 8.9–10.3)
Chloride: 111 mmol/L (ref 98–111)
Creatinine, Ser: 0.75 mg/dL (ref 0.44–1.00)
GFR, Estimated: >60 mL/min (ref >60)
Glucose, Bld: 110 mg/dL — ABNORMAL HIGH (ref 70–99)
Potassium: 4.1 mmol/L (ref 3.5–5.1)
Sodium: 136 mmol/L (ref 135–145)
Total Bilirubin: 1.1 mg/dL (ref 0.3–1.2)
Total Protein: 6.9 g/dL (ref 6.5–8.1)

## 2022-03-06 LAB — POCT PREGNANCY, URINE: Preg Test, Ur: NEGATIVE

## 2022-03-07 ENCOUNTER — Encounter: Payer: Self-pay | Admitting: Plastic Surgery

## 2022-03-07 ENCOUNTER — Ambulatory Visit (INDEPENDENT_AMBULATORY_CARE_PROVIDER_SITE_OTHER): Payer: Commercial Managed Care - PPO | Admitting: Plastic Surgery

## 2022-03-07 VITALS — BP 121/80 | HR 102 | Ht 65.0 in | Wt 174.8 lb

## 2022-03-07 DIAGNOSIS — Z9889 Other specified postprocedural states: Secondary | ICD-10-CM

## 2022-03-07 DIAGNOSIS — M952 Other acquired deformity of head: Secondary | ICD-10-CM | POA: Diagnosis not present

## 2022-03-07 DIAGNOSIS — Q670 Congenital facial asymmetry: Secondary | ICD-10-CM | POA: Insufficient documentation

## 2022-03-07 NOTE — Progress Notes (Signed)
Patient ID: Laurie Kelley, female    DOB: 06/20/1970, 51 y.o.   MRN: [id number]   Chief Complaint  Patient presents with   Advice Only    The patient is a 51 year old female here for evaluation of her face and head.  She is 5 feet 5 inches tall and weighs 174 pounds.  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  Since then she has noticed temporal wasting and would like to see if she can do something about it.  Her past medical history includes Lynch syndrome and hyperlipidemia.  She now has chronic headaches.  She has had craniotomy more than once, rotator cuff repair, shoulder arthroscopy and a cholecystectomy.  She still has meningiomas as well.    Review of Systems  Constitutional: Negative.   Eyes: Negative.   Respiratory: Negative.  Negative for chest tightness and shortness of breath.   Cardiovascular: Negative.  Negative for leg swelling.  Gastrointestinal: Negative.   Endocrine: Negative.   Genitourinary: Negative.   Musculoskeletal: Negative.     Past Medical History:  Diagnosis Date   Brain tumor (benign) ([place])    12 total, 4 have been removed   Cancer ([place])    Chest pain in adult 07/31/2017   Family history of brain tumor    Gallstones    Headache    Hyperlipidemia with target LDL less than 100 08/18/2015   Internal hemorrhoids 08/18/2014   Lynch syndrome    PONV (postoperative nausea and vomiting)     Past Surgical History:  Procedure Laterality Date   APPLICATION OF CRANIAL NAVIGATION Left 02/18/2021   Procedure: APPLICATION OF CRANIAL NAVIGATION;  Surgeon: Judith Part, MD;  Location: [place];  Service: Neurosurgery;  Laterality: Left;   APPLICATION OF CRANIAL NAVIGATION N/A 05/18/2021   Procedure: APPLICATION OF CRANIAL NAVIGATION;  Surgeon: Judith Part, MD;  Location: [place];  Service: Neurosurgery;  Laterality: N/A;   BRAIN SURGERY     CHOLECYSTECTOMY  1993   CRANIOTOMY Left 02/18/2021   Procedure:  Left orbitozygomatic craniotomy for tumor resection  with Brain Lab;  Surgeon: Judith Part, MD;  Location: [place];  Service: Neurosurgery;  Laterality: Left;   CRANIOTOMY Right 05/18/2021   Procedure: Right Orbitozygomatic craniotomy for tumor with brainlab;  Surgeon: Judith Part, MD;  Location: [place];  Service: Neurosurgery;  Laterality: Right;   SHOULDER ARTHROSCOPY Right 11/06/2019   Procedure: ARTHROSCOPY RIGHT SHOULDER, DEBRIDEMENT X TWO STRUCTURES; OPEN ROTATOR CUFF REPAIR;  Surgeon: Carole Civil, MD;  Location[facility];  Service: Orthopedics;  Laterality: Right;      Current Outpatient Medications:    atorvastatin (LIPITOR) 20 MG tablet, Take 1 tablet (20 mg total) by mouth daily., Disp: 90 tablet, Rfl: 3   Cholecalciferol (DIALYVITE VITAMIN D 5000) 125 MCG (5000 UT) capsule, Take 5,000 Units by mouth daily., Disp: , Rfl:    gabapentin (NEURONTIN) 600 MG tablet, Take 600 mg by mouth 3 (three) times daily., Disp: , Rfl:    hydrocortisone cream 1 %, Apply 1 Application topically daily as needed for itching., Disp: , Rfl:    loratadine-pseudoephedrine (CLARITIN-D 24-HOUR) 10-240 MG 24 hr tablet, Take 1 tablet by mouth daily as needed for allergies., Disp: , Rfl:    magnesium oxide (MAG-OX) 400 MG tablet, Take 400 mg by mouth daily., Disp: , Rfl:    predniSONE (DELTASONE) 50 MG tablet, Take one tablet at 13, 7 and 1 hour prior to contrast  injection for MRI scan, Disp: 15 tablet, Rfl: 0   Pumpkin Seed-Soy Germ (AZO BLADDER CONTROL/GO-LESS) CAPS, Take 1 capsule by mouth at bedtime., Disp: , Rfl:    diphenhydrAMINE (BENADRYL) 50 MG tablet, Take 1 tablet (50 mg total) by mouth once for 1 dose. Pt to take 50 mg of benadryl on 12/29/21 at 12:00 PM. Please call [phone] with any questions., Disp: 1 tablet, Rfl: 0  Current Facility-Administered Medications:    medroxyPROGESTERone (DEPO-PROVERA) injection 150 mg, 150 mg, Intramuscular, Q90 days, Ronnie Doss M, DO, 150 mg  at 07/01/21 1415   Objective:   Vitals:   03/07/22 1023  BP: 121/80  Pulse: (!) 102  SpO2: 100%    Physical Exam Cardiovascular:     Rate and Rhythm: Normal rate.     Pulses: Normal pulses.  Pulmonary:     Effort: Pulmonary effort is normal.  Musculoskeletal:        General: No swelling.  Skin:    General: Skin is warm.     Capillary Refill: Capillary refill takes less than 2 seconds.     Coloration: Skin is not jaundiced.     Findings: No bruising or lesion.  Neurological:     Mental Status: She is alert and oriented to person, place, and time.  Psychiatric:        Mood and Affect: Mood normal.        Behavior: Behavior normal.        Thought Content: Thought content normal.     Assessment & Plan:  Status post craniotomy  Acquired facial asymmetry  We talked about options for filler both with hyaluronic acid and with fat.  The patient is scheduled to have a hysterectomy tomorrow.  I would like to do a telemetry visit with her in January for further discussion.  In the meantime I will talk with Dr. Venetia Kelley.  I do not think that there is any great way of improving her temporal wasting without surgery which the patient would like to avoid.  Pictures were obtained of the patient and placed in the chart with the patient's or guardian's permission.   [place], DO

## 2022-03-08 ENCOUNTER — Encounter (HOSPITAL_COMMUNITY): Payer: Self-pay | Admitting: Obstetrics & Gynecology

## 2022-03-08 ENCOUNTER — Ambulatory Visit (HOSPITAL_BASED_OUTPATIENT_CLINIC_OR_DEPARTMENT_OTHER): Payer: Commercial Managed Care - PPO | Admitting: Certified Registered Nurse Anesthetist

## 2022-03-08 ENCOUNTER — Other Ambulatory Visit: Payer: Self-pay

## 2022-03-08 ENCOUNTER — Encounter (HOSPITAL_COMMUNITY)
Admission: RE | Disposition: A | Discharge status: Home / Self Care | Payer: Self-pay | Source: Home / Self Care | Attending: Obstetrics & Gynecology

## 2022-03-08 ENCOUNTER — Ambulatory Visit (HOSPITAL_COMMUNITY)
Admission: RE | Admit: 2022-03-08 | Discharge: 2022-03-08 | Disposition: A | Discharge status: Home / Self Care | Payer: Commercial Managed Care - PPO | Attending: Obstetrics & Gynecology | Admitting: Obstetrics & Gynecology

## 2022-03-08 ENCOUNTER — Ambulatory Visit (HOSPITAL_COMMUNITY): Payer: Commercial Managed Care - PPO | Admitting: Certified Registered Nurse Anesthetist

## 2022-03-08 DIAGNOSIS — Z01818 Encounter for other preprocedural examination: Secondary | ICD-10-CM

## 2022-03-08 DIAGNOSIS — K219 Gastro-esophageal reflux disease without esophagitis: Secondary | ICD-10-CM | POA: Insufficient documentation

## 2022-03-08 DIAGNOSIS — Z1509 Genetic susceptibility to other malignant neoplasm: Secondary | ICD-10-CM | POA: Insufficient documentation

## 2022-03-08 DIAGNOSIS — Z8603 Personal history of neoplasm of uncertain behavior: Secondary | ICD-10-CM | POA: Diagnosis not present

## 2022-03-08 DIAGNOSIS — Z9889 Other specified postprocedural states: Secondary | ICD-10-CM

## 2022-03-08 DIAGNOSIS — Z79890 Hormone replacement therapy (postmenopausal): Secondary | ICD-10-CM | POA: Diagnosis not present

## 2022-03-08 DIAGNOSIS — Z79899 Other long term (current) drug therapy: Secondary | ICD-10-CM | POA: Diagnosis not present

## 2022-03-08 DIAGNOSIS — Z299 Encounter for prophylactic measures, unspecified: Secondary | ICD-10-CM | POA: Diagnosis not present

## 2022-03-08 DIAGNOSIS — Z298 Encounter for other specified prophylactic measures: Secondary | ICD-10-CM | POA: Diagnosis not present

## 2022-03-08 HISTORY — PX: ABDOMINAL HYSTERECTOMY: SHX81

## 2022-03-08 HISTORY — PX: SALPINGOOPHORECTOMY: SHX82

## 2022-03-08 SURGERY — HYSTERECTOMY, ABDOMINAL
Anesthesia: General | Site: Abdomen

## 2022-03-08 MED ORDER — 0.9 % SODIUM CHLORIDE (POUR BTL) OPTIME
TOPICAL | Status: DC | PRN
  Administered 2022-03-08 (×2): 1000 mL

## 2022-03-08 MED ORDER — HYDROMORPHONE HCL 1 MG/ML IJ SOLN
0.5000 mg | INTRAMUSCULAR | Status: DC | PRN
  Administered 2022-03-08: 0.5 mg via INTRAVENOUS
  Filled 2022-03-08: qty 0.5

## 2022-03-08 MED ORDER — GABAPENTIN 300 MG PO CAPS: 300.0000 mg | ORAL_CAPSULE | Freq: Three times a day (TID) | ORAL | 0 refills | Status: DC

## 2022-03-08 MED ORDER — SUGAMMADEX SODIUM 200 MG/2ML IV SOLN
INTRAVENOUS | Status: DC | PRN
  Administered 2022-03-08: 200 mg via INTRAVENOUS

## 2022-03-08 MED ORDER — SCOPOLAMINE 1 MG/3DAYS TD PT72
1.0000 | MEDICATED_PATCH | TRANSDERMAL | Status: DC
  Administered 2022-03-08: 1.5 mg via TRANSDERMAL
  Filled 2022-03-08: qty 1

## 2022-03-08 MED ORDER — OXYCODONE HCL 5 MG PO TABS: 5.0000 mg | ORAL_TABLET | Freq: Four times a day (QID) | ORAL | 0 refills | Status: AC | PRN

## 2022-03-08 MED ORDER — ROCURONIUM BROMIDE 10 MG/ML (PF) SYRINGE
PREFILLED_SYRINGE | INTRAVENOUS | Status: DC | PRN
  Administered 2022-03-08: 20 mg via INTRAVENOUS
  Administered 2022-03-08: 45 mg via INTRAVENOUS
  Administered 2022-03-08: 5 mg via INTRAVENOUS

## 2022-03-08 MED ORDER — DOCUSATE SODIUM 100 MG PO CAPS: 100.0000 mg | ORAL_CAPSULE | Freq: Two times a day (BID) | ORAL | 0 refills | Status: AC

## 2022-03-08 MED ORDER — DEXAMETHASONE SODIUM PHOSPHATE 10 MG/ML IJ SOLN
INTRAMUSCULAR | Status: AC
  Filled 2022-03-08: qty 1

## 2022-03-08 MED ORDER — IBUPROFEN 600 MG PO TABS: 600.0000 mg | ORAL_TABLET | Freq: Four times a day (QID) | ORAL | 0 refills | Status: DC | PRN

## 2022-03-08 MED ORDER — MIDAZOLAM HCL 2 MG/2ML IJ SOLN
INTRAMUSCULAR | Status: AC
  Filled 2022-03-08: qty 2

## 2022-03-08 MED ORDER — KETOROLAC TROMETHAMINE 30 MG/ML IJ SOLN
INTRAMUSCULAR | Status: AC
  Filled 2022-03-08: qty 1

## 2022-03-08 MED ORDER — OXYCODONE HCL 5 MG PO TABS: 5.0000 mg | ORAL_TABLET | Freq: Once | ORAL | Status: DC | PRN

## 2022-03-08 MED ORDER — ROCURONIUM BROMIDE 10 MG/ML (PF) SYRINGE
PREFILLED_SYRINGE | INTRAVENOUS | Status: AC
  Filled 2022-03-08: qty 10

## 2022-03-08 MED ORDER — FENTANYL CITRATE PF 50 MCG/ML IJ SOSY
25.0000 ug | PREFILLED_SYRINGE | INTRAMUSCULAR | Status: DC | PRN
  Administered 2022-03-08 (×2): 50 ug via INTRAVENOUS
  Filled 2022-03-08 (×2): qty 1

## 2022-03-08 MED ORDER — PROPOFOL 10 MG/ML IV BOLUS
INTRAVENOUS | Status: AC
  Filled 2022-03-08: qty 20

## 2022-03-08 MED ORDER — FENTANYL CITRATE (PF) 100 MCG/2ML IJ SOLN
INTRAMUSCULAR | Status: DC | PRN
  Administered 2022-03-08: 50 ug via INTRAVENOUS
  Administered 2022-03-08: 100 ug via INTRAVENOUS
  Administered 2022-03-08 (×2): 50 ug via INTRAVENOUS

## 2022-03-08 MED ORDER — SUCCINYLCHOLINE CHLORIDE 200 MG/10ML IV SOSY
PREFILLED_SYRINGE | INTRAVENOUS | Status: AC
  Filled 2022-03-08: qty 10

## 2022-03-08 MED ORDER — FENTANYL CITRATE (PF) 250 MCG/5ML IJ SOLN
INTRAMUSCULAR | Status: AC
  Filled 2022-03-08: qty 5

## 2022-03-08 MED ORDER — SUCCINYLCHOLINE CHLORIDE 200 MG/10ML IV SOSY
PREFILLED_SYRINGE | INTRAVENOUS | Status: DC | PRN
  Administered 2022-03-08: 140 mg via INTRAVENOUS

## 2022-03-08 MED ORDER — HEMOSTATIC AGENTS (NO CHARGE) OPTIME
TOPICAL | Status: DC | PRN
  Administered 2022-03-08: 1 via TOPICAL

## 2022-03-08 MED ORDER — LACTATED RINGERS IV SOLN: INTRAVENOUS | Status: DC

## 2022-03-08 MED ORDER — BUPIVACAINE-MELOXICAM ER 200-6 MG/7ML IJ SOLN
INTRAMUSCULAR | Status: AC
  Filled 2022-03-08: qty 1

## 2022-03-08 MED ORDER — KETOROLAC TROMETHAMINE 10 MG PO TABS: 10.0000 mg | ORAL_TABLET | Freq: Three times a day (TID) | ORAL | 0 refills | Status: AC

## 2022-03-08 MED ORDER — ONDANSETRON HCL 4 MG/2ML IJ SOLN
4.0000 mg | Freq: Once | INTRAMUSCULAR | Status: AC | PRN
  Administered 2022-03-08: 4 mg via INTRAVENOUS
  Filled 2022-03-08: qty 2

## 2022-03-08 MED ORDER — CEFAZOLIN SODIUM-DEXTROSE 2-4 GM/100ML-% IV SOLN
2.0000 g | INTRAVENOUS | Status: AC
  Administered 2022-03-08: 2 g via INTRAVENOUS

## 2022-03-08 MED ORDER — LIDOCAINE HCL (CARDIAC) PF 100 MG/5ML IV SOSY
PREFILLED_SYRINGE | INTRAVENOUS | Status: DC | PRN
  Administered 2022-03-08: 60 mg via INTRATRACHEAL

## 2022-03-08 MED ORDER — OXYCODONE HCL 5 MG/5ML PO SOLN: 5.0000 mg | Freq: Once | ORAL | Status: DC | PRN

## 2022-03-08 MED ORDER — ONDANSETRON 4 MG PO TBDP: 4.0000 mg | ORAL_TABLET | Freq: Three times a day (TID) | ORAL | 0 refills | Status: DC | PRN

## 2022-03-08 MED ORDER — ONDANSETRON HCL 4 MG/2ML IJ SOLN
INTRAMUSCULAR | Status: DC | PRN
  Administered 2022-03-08: 4 mg via INTRAVENOUS

## 2022-03-08 MED ORDER — MIDAZOLAM HCL 2 MG/2ML IJ SOLN
INTRAMUSCULAR | Status: DC | PRN
  Administered 2022-03-08: 2 mg via INTRAVENOUS

## 2022-03-08 MED ORDER — LIDOCAINE HCL (PF) 2 % IJ SOLN
INTRAMUSCULAR | Status: AC
  Filled 2022-03-08: qty 5

## 2022-03-08 MED ORDER — PROMETHAZINE HCL 25 MG/ML IJ SOLN
12.5000 mg | INTRAMUSCULAR | Status: DC | PRN
  Administered 2022-03-08: 12.5 mg via INTRAVENOUS
  Filled 2022-03-08: qty 1

## 2022-03-08 MED ORDER — ACETAMINOPHEN 325 MG PO TABS: 650.0000 mg | ORAL_TABLET | Freq: Four times a day (QID) | ORAL | Status: DC | PRN

## 2022-03-08 MED ORDER — PROPOFOL 10 MG/ML IV BOLUS
INTRAVENOUS | Status: DC | PRN
  Administered 2022-03-08: 170 mg via INTRAVENOUS

## 2022-03-08 MED ORDER — KETOROLAC TROMETHAMINE 15 MG/ML IJ SOLN
30.0000 mg | INTRAMUSCULAR | Status: AC
  Administered 2022-03-08: 30 mg via INTRAVENOUS
  Filled 2022-03-08: qty 2

## 2022-03-08 MED ORDER — CEFAZOLIN SODIUM-DEXTROSE 2-4 GM/100ML-% IV SOLN
INTRAVENOUS | Status: AC
  Filled 2022-03-08: qty 100

## 2022-03-08 MED ORDER — DEXAMETHASONE SODIUM PHOSPHATE 10 MG/ML IJ SOLN
INTRAMUSCULAR | Status: DC | PRN
  Administered 2022-03-08: 10 mg via INTRAVENOUS

## 2022-03-08 MED ORDER — ONDANSETRON HCL 4 MG/2ML IJ SOLN
INTRAMUSCULAR | Status: AC
  Filled 2022-03-08: qty 2

## 2022-03-08 MED ORDER — BUPIVACAINE-MELOXICAM ER 400-12 MG/14ML IJ SOLN
INTRAMUSCULAR | Status: DC | PRN
  Administered 2022-03-08: 200 mg

## 2022-03-08 MED ORDER — POVIDONE-IODINE 10 % EX SWAB: 2.0000 | Freq: Once | CUTANEOUS | Status: DC

## 2022-03-08 SURGICAL SUPPLY — 43 items
ADH SKN CLS APL DERMABOND .7 (GAUZE/BANDAGES/DRESSINGS) ×2
APL SWBSTK 6 STRL LF DISP (MISCELLANEOUS) ×2
APPLICATOR COTTON TIP 6 STRL (MISCELLANEOUS) ×3 IMPLANT
APPLICATOR COTTON TIP 6IN STRL (MISCELLANEOUS) ×2
CLOTH BEACON ORANGE TIMEOUT ST (SAFETY) ×3 IMPLANT
COVER SURGICAL LIGHT HANDLE (MISCELLANEOUS) ×6 IMPLANT
DERMABOND ADVANCED .7 DNX12 (GAUZE/BANDAGES/DRESSINGS) ×3 IMPLANT
DRAPE WARM FLUID 44X44 (DRAPES) IMPLANT
DRSG OPSITE POSTOP 4X8 (GAUZE/BANDAGES/DRESSINGS) IMPLANT
DURAPREP 26ML APPLICATOR (WOUND CARE) ×3 IMPLANT
GAUZE 4X4 16PLY ~~LOC~~+RFID DBL (SPONGE) ×6 IMPLANT
GLOVE BIO SURGEON STRL SZ 6.5 (GLOVE) ×3 IMPLANT
GLOVE BIO SURGEON STRL SZ7 (GLOVE) IMPLANT
GLOVE BIOGEL PI IND STRL 7.0 (GLOVE) ×9 IMPLANT
GLOVE BIOGEL PI IND STRL 8 (GLOVE) IMPLANT
GLOVE ECLIPSE 8.0 STRL XLNG CF (GLOVE) IMPLANT
GOWN STRL REUS W/ TWL LRG LVL3 (GOWN DISPOSABLE) ×3 IMPLANT
GOWN STRL REUS W/TWL LRG LVL3 (GOWN DISPOSABLE) ×5 IMPLANT
GOWN STRL REUS W/TWL XL LVL3 (GOWN DISPOSABLE) ×3 IMPLANT
KIT BLADEGUARD II DBL (SET/KITS/TRAYS/PACK) ×3 IMPLANT
KIT TURNOVER KIT A (KITS) ×6 IMPLANT
NS IRRIG 1000ML POUR BTL (IV SOLUTION) ×3 IMPLANT
PACK ABDOMINAL MAJOR (CUSTOM PROCEDURE TRAY) ×3 IMPLANT
PAD ARMBOARD 7.5X6 YLW CONV (MISCELLANEOUS) ×6 IMPLANT
PENCIL SMOKE EVACUATOR (MISCELLANEOUS) ×3 IMPLANT
POWDER SURGICEL 3.0 GRAM (HEMOSTASIS) IMPLANT
RETRACTOR WOUND ALXS 18CM MED (MISCELLANEOUS) ×3 IMPLANT
RTRCTR WOUND ALEXIS O 18CM MED (MISCELLANEOUS) ×2
SET BASIN LINEN APH (SET/KITS/TRAYS/PACK) ×6 IMPLANT
SOL PREP POV-IOD 4OZ 10% (MISCELLANEOUS) ×3 IMPLANT
SPONGE T-LAP 18X18 ~~LOC~~+RFID (SPONGE) ×6 IMPLANT
SUT PLAIN 2 0 XLH (SUTURE) IMPLANT
SUT VIC AB 0 CT1 27 (SUTURE) ×4
SUT VIC AB 0 CT1 27XCR 8 STRN (SUTURE) ×6 IMPLANT
SUT VIC AB 0 CT1 36 (SUTURE) ×3 IMPLANT
SUT VIC AB 0 CTX 36 (SUTURE) ×2
SUT VIC AB 0 CTX36XBRD ANTBCTR (SUTURE) IMPLANT
SUT VIC AB 2-0 CT1 27 (SUTURE) ×2
SUT VIC AB 2-0 CT1 TAPERPNT 27 (SUTURE) IMPLANT
SUT VICRYL 3 0 (SUTURE) ×3 IMPLANT
TOWEL ~~LOC~~+RFID 17X26 BLUE (SPONGE) ×3 IMPLANT
TRAY FOLEY W/BAG SLVR 16FR (SET/KITS/TRAYS/PACK) ×2
TRAY FOLEY W/BAG SLVR 16FR ST (SET/KITS/TRAYS/PACK) ×3 IMPLANT

## 2022-03-08 NOTE — Anesthesia Preprocedure Evaluation (Signed)
Anesthesia Evaluation  Patient identified by MRN, date of birth, ID band Patient awake    Reviewed: Allergy & Precautions, H&P , NPO status , Patient's Chart, lab work & pertinent test results, reviewed documented beta blocker date and time   History of Anesthesia Complications (+) PONV and history of anesthetic complications  Airway Mallampati: II  TM Distance: >3 FB Neck ROM: full    Dental no notable dental hx.    Pulmonary neg pulmonary ROS,    Pulmonary exam normal breath sounds clear to auscultation       Cardiovascular Exercise Tolerance: Good negative cardio ROS   Rhythm:regular Rate:Normal     Neuro/Psych  Headaches, negative psych ROS   GI/Hepatic Neg liver ROS, GERD  Medicated,  Endo/Other  negative endocrine ROS  Renal/GU negative Renal ROS  negative genitourinary   Musculoskeletal   Abdominal   Peds  Hematology negative hematology ROS (+)   Anesthesia Other Findings   Reproductive/Obstetrics negative OB ROS                             Anesthesia Physical Anesthesia Plan  ASA: 2  Anesthesia Plan: General and General ETT   Post-op Pain Management:    Induction:   PONV Risk Score and Plan: Ondansetron  Airway Management Planned:   Additional Equipment:   Intra-op Plan:   Post-operative Plan:   Informed Consent: I have reviewed the patients History and Physical, chart, labs and discussed the procedure including the risks, benefits and alternatives for the proposed anesthesia with the patient or authorized representative who has indicated his/her understanding and acceptance.     Dental Advisory Given  Plan Discussed with: CRNA  Anesthesia Plan Comments:         Anesthesia Quick Evaluation

## 2022-03-08 NOTE — Op Note (Signed)
  Preoperative diagnosis:  Lynch syndrome                                          Postoperative diagnosis:  same as above  Procedure:  Total abdominal hysterectomy, bilateral salpingo-oophorectomy  Surgeon:  Dr. Janyth Pupa  Assistant:  Dr. Darlis Loan  Anesthesia:  General endotracheal  Intraoperative findings: 1) Normal uterus with bilateral tubes and ovaries  EBL 50cc UOP: 50cc IVF: 800cc  Description of operation:    Patient was taken to the operating room and placed in the supine position where she underwent general endotracheal anesthesia.  She was then prepped and draped in the usual sterile fashion and a Foley catheter was placed for continuous bladder drainage.  A 4 cm transverse skin incision was made 1 fingerbreadth above the pubis and carried down sharply to the rectus fascia which was scored in the midline and extended laterally.  The fascia was taken off the muscles superiorly and inferiorly without difficulty.  The muscles were divided.  The peritoneal cavity was entered. A small Alexis self retaining retractor was placed. The upper abdomen was packed with wet lap pads.  Both uterine cornu were grasped with Kocker clamps.  The right round ligament was suture ligated and cut with the electrocautery unit. An avascular window was made in the right utero ovarian ligament broad ligament.  The pedicle was double suture ligated and cut.  The same sequence of steps/procedure was performed on the left sideas well.    Serial pedicles were taken medial to the utero ovarian pedicle.  Each pedicle was clamped cut and transfixion suture ligated.   The uterine vessels were skeletonized bilaterally.  The uterine vessels were clamped bilaterally,  then cut and suture ligated. Several  more pedicles were taken down the cervix medial to the uterine vessels.  Each pedicle was clamped cut and suture ligated with good resulting hemostasis.The vagina was cross clamped and the uterus with cervix was  removed.  The vagina was closed with interrupted figure of eight sutures with good hemostasis.     Attention was turned to the left adnexa.  The left fallopian tube and ovary was grasped with a babcock and using a curved heany clamp was cut, clamped and suture ligated.  A similar procedure was carried out on the right side.  Thus the ovaries were preserved. Both fallopian tubes were sent to pathology along with the other specimen.     The pelvis was irrigated vigorously and all pedicles were examined and found to be hemostatic. All counts were correct at this point x 3.  Surgicel  was used for additional hemostasis.  The muscles and peritoneum were reapproximated loosely.  The fascia was closed with 0 Vicryl running. Zyrnrelief was injected into the subcutaneous tissue for post operative pain management.   The skin was closed using 3-0 Vicryl on a Keith needle in a subcuticular fashion.  Dermabond was then applied for additional wound integrity and to serve as a postoperative bacterial barrier.    The patient was awakened from anesthesia taken to the recovery room in good stable condition. All sponge instrument and needle counts were correct x 3.  The patient received Ancef and Toradol prophylactically preoperatively.   Janyth Pupa, DO Attending [place], [facility] for [employer], [place]

## 2022-03-08 NOTE — Transfer of Care (Signed)
Immediate Anesthesia Transfer of Care Note  Patient: Laurie Kelley  Procedure(s) Performed: HYSTERECTOMY ABDOMINAL (Abdomen) OPEN SALPINGO OOPHORECTOMY (Bilateral: Abdomen)  Patient Location: PACU  Anesthesia Type:General  Level of Consciousness: drowsy  Airway & Oxygen Therapy: Patient Spontanous Breathing and Patient connected to nasal cannula oxygen  Post-op Assessment: Report given to RN and Post -op Vital signs reviewed and stable  Post vital signs: Reviewed and stable  Last Vitals:  Vitals Value Taken Time  BP 125/68   Temp 97.8   Pulse 91 03/08/22 0957  Resp 26 03/08/22 0957  SpO2 100 % 03/08/22 0957  Vitals shown include unvalidated device data.  Last Pain:  Vitals:   03/08/22 0747  TempSrc: Oral  PainSc: 8       Patients Stated Pain Goal: 10 (43/15/40 0867)  Complications: No notable events documented.

## 2022-03-08 NOTE — Discharge Instructions (Signed)
Post Operative Pain Med Plan:  >Take gabapentin 300 mg three times per day, as prescribed for 4 days, try to space them evenly  >Take the oxycodone- 1 tablet, on a schedule, around the clock, every 6 hours(set your phone alarm) for the first 2 days, there may be times when you will need 2 tablets but taking them on a schedule will decrease this need  >You can also take Tylenol together with the oxycodone.  If the oxycodone seems "to strong" then just take Tylenol  >Oxycodone will cause constipation, please be sure to take a stool softener (Colace) twice daily while taking this pain medication and/or continue this medication until your bowel regimen returns to normal  >Take the Toradol every 8 hours for the first 3 days then the remainder to supplement the pain as needed  >After the toradol is gone switch to Ibuprofen '600mg'$  every 6 hours as needed  If possible try to take the Toradol or Ibuprofen with food to help avoid upsetting your stomach  >Use a heating pad as well as needed  >I have also sent a prescription for zofran (ondansetron) for nausea to take if needed over the first couple of days  >Be gentle with your diet the first few days, liquids and soft non spicy food, fruits are great  >Get up and move, no lifting or straining  Post Operative Pain Med Plan:  >Take gabapentin 300 mg three times per day, as prescribed for 4 days, try to space them evenly  >Take the oxycodone- 1 tablet, on a schedule, around the clock, every 6 hours(set your phone alarm) for the first 2 days, there may be times when you will need 2 tablets but taking them on a schedule will decrease this need  >You can also take Tylenol together with the oxycodone.  If the oxycodone seems "to strong" then just take Tylenol  >Oxycodone will cause constipation, please be sure to take a stool softener (Colace) twice daily while taking this pain medication and/or continue this medication until your bowel regimen returns  to normal  >Take the Toradol every 8 hours for the first 3 days then the remainder to supplement the pain as needed  >After the toradol is gone switch to Ibuprofen '600mg'$  every 6 hours as needed  If possible try to take the Toradol or Ibuprofen with food to help avoid upsetting your stomach  >Use a heating pad as well as needed  >I have also sent a prescription for zofran (ondansetron) for nausea to take if needed over the first couple of days  >Be gentle with your diet the first few days, liquids and soft non spicy food, fruits are great  >Get up and move, no lifting or straining  HOME INSTRUCTIONS  Please note any unusual or excessive bleeding, pain, swelling. Mild dizziness or drowsiness are normal for about 24 hours after surgery.   Shower when comfortable  Restrictions: No driving for 24 hours or while taking pain medications.  Activity:  No heavy lifting (> 10 lbs), nothing in vagina (no tampons, douching, or intercourse) x 4 weeks; no tub baths for 4 weeks Vaginal spotting is expected but if your bleeding is heavy, period like,  please call the office   Incision: the bandaids will fall off when they are ready to; you may clean your incision with mild soap and water but do not rub or scrub the incision site.  You may experience slight bloody drainage from your incision periodically.  This is normal.  If you experience a large amount of drainage or the incision opens, please call your physician who will likely direct you to the emergency department.  Diet:  You may return to your regular diet.  Do not eat large meals.  Eat small frequent meals throughout the day.  Continue to drink a good amount of water at least 6-8 glasses of water per day, hydration is very important for the healing process.  Pain Management: Follow above instructions  Always take prescription pain medication with food.  Percocet may cause constipation, you may want to take a stool softener while taking this  medication.  A prescription of colace has been sent in to take twice daily if needed while taking the oxycodone.  Be sure to drink plenty of fluids and increase your fiber to help with constipation.  Alcohol -- Avoid for 24 hours and while taking pain medications.  Nausea: Take sips of ginger ale or soda  Fever -- Call physician if temperature over 101 degrees  Follow up:  If you do not already have a follow up appointment scheduled, please call the office at [phone].  If you experience fever (a temperature greater than 100.4), pain unrelieved by pain medication, shortness of breath, swelling of a single leg, or any other symptoms which are concerning to you please the office immediately.

## 2022-03-08 NOTE — Anesthesia Procedure Notes (Signed)
Procedure Name: Intubation Date/Time: 03/08/2022 8:26 AM  Performed by: Karna Dupes, CRNAPre-anesthesia Checklist: Patient identified, Emergency Drugs available, Suction available and Patient being monitored Patient Re-evaluated:Patient Re-evaluated prior to induction Oxygen Delivery Method: Circle system utilized Preoxygenation: Pre-oxygenation with 100% oxygen Induction Type: IV induction Ventilation: Mask ventilation without difficulty Laryngoscope Size: Glidescope and 3 Grade View: Grade I Tube type: Oral Tube size: 7.0 mm Number of attempts: 1 Airway Equipment and Method: Stylet Placement Confirmation: ETT inserted through vocal cords under direct vision, positive ETCO2 and breath sounds checked- equal and bilateral Secured at: 21 cm Tube secured with: Tape Dental Injury: Teeth and Oropharynx as per pre-operative assessment  Comments: Limited mouth opening due to prior brain surgery; Glidescope 3 used grade 1 view.

## 2022-03-09 ENCOUNTER — Telehealth: Payer: Self-pay | Admitting: Obstetrics & Gynecology

## 2022-03-09 DIAGNOSIS — Z1509 Genetic susceptibility to other malignant neoplasm: Secondary | ICD-10-CM

## 2022-03-09 MED ORDER — HYDROCODONE BITARTRATE ER 10 MG PO CP12: 1.0000 | ORAL_CAPSULE | Freq: Two times a day (BID) | ORAL | 0 refills | Status: AC | PRN

## 2022-03-09 NOTE — Anesthesia Postprocedure Evaluation (Signed)
Anesthesia Post Note  Patient: Laurie Kelley  Procedure(s) Performed: HYSTERECTOMY ABDOMINAL (Abdomen) OPEN SALPINGO OOPHORECTOMY (Bilateral: Abdomen)  Patient location during evaluation: Phase II Anesthesia Type: General Level of consciousness: awake Pain management: pain level controlled Vital Signs Assessment: post-procedure vital signs reviewed and stable Respiratory status: spontaneous breathing and respiratory function stable Cardiovascular status: blood pressure returned to baseline and stable Postop Assessment: no headache and no apparent nausea or vomiting Anesthetic complications: no Comments: Late entry   No notable events documented.   Last Vitals:  Vitals:   03/08/22 1300 03/08/22 1410  BP: 136/73 138/73  Pulse: 87 86  Resp: (!) 21 17  Temp:  36.7 C  SpO2: 100% 100%    Last Pain:  Vitals:   03/08/22 1412  TempSrc:   PainSc: [place]

## 2022-03-09 NOTE — Telephone Encounter (Signed)
Called patient- she is doing ok.  She cannot take the oxycodone as it causes a rash and prefers hydrocodone instead.  New Rx sent in.  Otherwise, tolerating diet and doing ok.  Laurie Pupa, DO Attending [place], [facility] for [employer], [place]

## 2022-03-10 LAB — SURGICAL PATHOLOGY

## 2022-03-15 ENCOUNTER — Encounter: Payer: Self-pay | Admitting: Obstetrics & Gynecology

## 2022-03-15 ENCOUNTER — Ambulatory Visit (INDEPENDENT_AMBULATORY_CARE_PROVIDER_SITE_OTHER): Payer: Commercial Managed Care - PPO | Admitting: Obstetrics & Gynecology

## 2022-03-15 VITALS — BP 115/76 | HR 102 | Ht 65.0 in

## 2022-03-15 DIAGNOSIS — Z4889 Encounter for other specified surgical aftercare: Secondary | ICD-10-CM

## 2022-03-15 NOTE — Progress Notes (Signed)
    PostOp Visit Note  Laurie Kelley is a 51 y.o. G77P0 female who presents for a postoperative visit. She is 1 week postop following a TAH, BSO completed on 03/08/22   Today she notes that she feels weak, mostly from her not being able "pack food."  She reports she has not had a BM since surgery.  She reports this happened to her before after her first surgery.  She is taking the stool softener, but has not tried any other medication.  Pain well controlled, taking just gabapentin and ibuprofen. Denies fever or chills.  Tolerating gen diet.  +Flatus.    Review of Systems Denies fevers, occasional chills.  No SOB/chest pain.  Abdominal pain minimal.  Urinating without issues.    Objective:  BP 115/76 (BP Location: Right Arm, Patient Position: Sitting, Cuff Size: Normal)   Pulse (!) 102   Ht '5\' 5"'$  (1.651 m)   BMI 28.98 kg/m    Physical Examination:  GENERAL ASSESSMENT: no acute distress SKIN: warm and dry CHEST: normal air exchange, respiratory effort normal with no retractions HEART: regular rate and rhythm ABDOMEN: soft, non-tender, minimal distension, +BS INCISION: C/D/I- honeycomb removed, appropriate ecchymosis noted EXTREMITY: no edema, no calf tenderness bilaterally PSYCH: mood appropriate, normal affect       Assessment:    Postop incision check   Plan:   -pt today to use Fleet enema and/or other supplementation -if no BM by today, pt to go to ER or to our office for dismpaction -pt to call with any acute concerns -reviewed continued postop care -f/u in 4-5 wks for final postop visit  Janyth Pupa, DO Attending [place], [place] for [employer], [place]

## 2022-03-16 ENCOUNTER — Encounter (HOSPITAL_COMMUNITY): Payer: Self-pay | Admitting: Obstetrics & Gynecology

## 2022-04-12 ENCOUNTER — Ambulatory Visit
Admission: RE | Admit: 2022-04-12 | Discharge: 2022-04-12 | Disposition: A | Discharge status: Home / Self Care | Payer: Commercial Managed Care - PPO | Source: Ambulatory Visit | Attending: Radiation Oncology | Admitting: Radiation Oncology

## 2022-04-12 DIAGNOSIS — D329 Benign neoplasm of meninges, unspecified: Secondary | ICD-10-CM

## 2022-04-12 MED ORDER — GADOPICLENOL 0.5 MMOL/ML IV SOLN
7.5000 mL | Freq: Once | INTRAVENOUS | Status: AC | PRN
  Administered 2022-04-12: 7.5 mL via INTRAVENOUS

## 2022-04-21 ENCOUNTER — Ambulatory Visit (INDEPENDENT_AMBULATORY_CARE_PROVIDER_SITE_OTHER): Payer: Commercial Managed Care - PPO | Admitting: Obstetrics & Gynecology

## 2022-04-21 ENCOUNTER — Encounter: Payer: Self-pay | Admitting: Obstetrics & Gynecology

## 2022-04-21 VITALS — BP 118/75 | HR 88 | Ht 65.0 in | Wt 163.0 lb

## 2022-04-21 DIAGNOSIS — Z1509 Genetic susceptibility to other malignant neoplasm: Secondary | ICD-10-CM

## 2022-04-21 DIAGNOSIS — Z4889 Encounter for other specified surgical aftercare: Secondary | ICD-10-CM

## 2022-04-21 DIAGNOSIS — R350 Frequency of micturition: Secondary | ICD-10-CM

## 2022-04-21 NOTE — Progress Notes (Signed)
    PostOp Visit Note  Laurie Kelley is a 51 y.o. G71P0 female who presents for a postoperative visit. She is 6 weeks postop following a TAH, BSO completed on 9/20   She notes occasional discomfort- taking gabapentin when needed.  Last 2 weeks- notes urinary frequency.  However, this am when she got here, she was not able to void.  Denies dysuria. Denies fever or chills.  Tolerating gen diet.  +Flatus, Regular BMs, but still constipation noted.  Vaginal odor- denies odor currently, but feels like she has a change in her pH.  Denies vaginal discharge, itching or irritation.  Just notes an odor that will not go away.  Tried OTC Lume with no improvement.  Review of Systems Pertinent items are noted in HPI.    Objective:  BP 118/75 (BP Location: Right Arm, Patient Position: Sitting, Cuff Size: Normal)   Pulse 88   Ht '5\' 5"'$  (1.651 m)   Wt 163 lb (73.9 kg)   BMI 27.12 kg/m    Physical Examination:  GENERAL ASSESSMENT: well developed and well nourished SKIN: normal color, no lesions CHEST: normal air exchange, respiratory effort normal with no retractions HEART: regular rate and rhythm ABDOMEN: soft, non-distended INCISION: healing appropriately- clean/dry with dermabond EXTREMITY: no calf tenderness, no edema bilaterally PSYCH: mood appropriate, normal affect       Assessment:    S/p TAH, BSO   Plan:   -Urinary frequency  Pt to leave sample to r/o underlying infection -meeting milestones appropriately -should she note worsening odor- RTC for vaginitis check -f/u prn or in 79yr  JJanyth Pupa DO Attending [place] [facility] [facility] [place]

## 2022-05-22 ENCOUNTER — Other Ambulatory Visit (HOSPITAL_COMMUNITY): Payer: Self-pay | Admitting: Neurological Surgery

## 2022-05-22 ENCOUNTER — Encounter: Payer: Self-pay | Admitting: Family Medicine

## 2022-05-22 ENCOUNTER — Other Ambulatory Visit: Payer: Self-pay | Admitting: Family Medicine

## 2022-05-22 DIAGNOSIS — Z1509 Genetic susceptibility to other malignant neoplasm: Secondary | ICD-10-CM

## 2022-05-22 DIAGNOSIS — D320 Benign neoplasm of cerebral meninges: Secondary | ICD-10-CM

## 2022-05-22 DIAGNOSIS — D496 Neoplasm of unspecified behavior of brain: Secondary | ICD-10-CM

## 2022-05-22 DIAGNOSIS — Z9889 Other specified postprocedural states: Secondary | ICD-10-CM

## 2022-05-22 DIAGNOSIS — R220 Localized swelling, mass and lump, head: Secondary | ICD-10-CM

## 2022-06-01 ENCOUNTER — Telehealth: Payer: Self-pay | Admitting: Gastroenterology

## 2022-06-01 NOTE — Telephone Encounter (Signed)
Medical clearance request faxed to Dr. Oralia Rud office.

## 2022-06-01 NOTE — Telephone Encounter (Signed)
Inbound call from patient stating that she needed to schedule her colonoscopy with Dr. Bryan Lemma. Looking back at her last OV note Dr. Bryan Lemma mentions she can have both colon and endo done. 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 Seeking advice if patient can be schedule or if she will need clearance  before. Please advise.

## 2022-06-01 NOTE — Telephone Encounter (Signed)
Yes, initially seen by me back in March and we had planned on eventually setting up both EGD and colonoscopy for screening.  Looks like she was last seen by Dr. Venetia Constable on 11/8, and more recently by Dr. Katherine Roan at [facility] on 12/11 for second opinion.  She would definitely need clearance from her neurosurgeon to proceed with EGD and colonoscopy.  Based on her history, more appropriate to be done at [facility] unit.

## 2022-06-05 ENCOUNTER — Encounter: Payer: Self-pay | Admitting: Orthopedic Surgery

## 2022-06-05 ENCOUNTER — Ambulatory Visit (INDEPENDENT_AMBULATORY_CARE_PROVIDER_SITE_OTHER): Payer: Commercial Managed Care - PPO | Admitting: Orthopedic Surgery

## 2022-06-05 DIAGNOSIS — M25512 Pain in left shoulder: Secondary | ICD-10-CM | POA: Diagnosis not present

## 2022-06-05 DIAGNOSIS — M25511 Pain in right shoulder: Secondary | ICD-10-CM

## 2022-06-05 MED ORDER — METHYLPREDNISOLONE ACETATE 40 MG/ML IJ SUSP
40.0000 mg | Freq: Once | INTRAMUSCULAR | Status: AC
  Administered 2022-06-05: 40 mg via INTRA_ARTICULAR

## 2022-06-05 NOTE — Progress Notes (Signed)
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  The injection was performed at the subacromial space right and left shoulder  Medication Depomedrol 40 mg and 1% lidocaine plain   There were no complications  The patient was observed for any reactions there were none and the patient was discharged.

## 2022-06-05 NOTE — Telephone Encounter (Signed)
Received call from pt stating that the clearance would need to be requested from Dr. Zada Finders because he is still her primary neurosurgeon and she just saw Dr. Katherine Roan once for a second opinion. Clearance request faxed to Dr. Zada Finders.

## 2022-06-05 NOTE — Addendum Note (Signed)
Addended byCandice Camp on: 06/05/2022 03:14 PM   Modules accepted: Orders

## 2022-06-05 NOTE — Patient Instructions (Signed)

## 2022-06-08 NOTE — Telephone Encounter (Signed)
Clearance received.

## 2022-06-14 ENCOUNTER — Other Ambulatory Visit: Payer: Self-pay

## 2022-06-14 DIAGNOSIS — Z1509 Genetic susceptibility to other malignant neoplasm: Secondary | ICD-10-CM

## 2022-06-14 DIAGNOSIS — D496 Neoplasm of unspecified behavior of brain: Secondary | ICD-10-CM

## 2022-06-14 DIAGNOSIS — Z8489 Family history of other specified conditions: Secondary | ICD-10-CM

## 2022-06-14 DIAGNOSIS — Z1211 Encounter for screening for malignant neoplasm of colon: Secondary | ICD-10-CM

## 2022-06-14 NOTE — Telephone Encounter (Signed)
Pt scheduled for EGD and colonoscopy with Dr. Bryan Lemma on 06/28/22 at 9:45 am. Pt also scheduled for a telephone Previsit on 06/16/22 at 2:30 pm since she was unable to do an in person previsit. Pt also reports she is no longer taking ASA 81 mg, she stopped taking this in May.

## 2022-06-16 ENCOUNTER — Ambulatory Visit (AMBULATORY_SURGERY_CENTER): Payer: Commercial Managed Care - PPO

## 2022-06-16 VITALS — Ht 65.0 in | Wt 163.0 lb

## 2022-06-16 DIAGNOSIS — Z1509 Genetic susceptibility to other malignant neoplasm: Secondary | ICD-10-CM

## 2022-06-16 DIAGNOSIS — Z1211 Encounter for screening for malignant neoplasm of colon: Secondary | ICD-10-CM

## 2022-06-16 MED ORDER — NA SULFATE-K SULFATE-MG SULF 17.5-3.13-1.6 GM/177ML PO SOLN: 1.0000 | Freq: Once | ORAL | 0 refills | Status: AC

## 2022-06-16 NOTE — Progress Notes (Signed)

## 2022-06-22 ENCOUNTER — Encounter (HOSPITAL_COMMUNITY): Payer: Self-pay | Admitting: Gastroenterology

## 2022-06-26 ENCOUNTER — Encounter: Payer: Self-pay | Admitting: Gastroenterology

## 2022-06-27 NOTE — Anesthesia Preprocedure Evaluation (Addendum)
Anesthesia Evaluation  Patient identified by MRN, date of birth, ID band Patient awake    Reviewed: Allergy & Precautions, NPO status , Patient's Chart, lab work & pertinent test results  History of Anesthesia Complications (+) PONV and history of anesthetic complications  Airway Mallampati: II  TM Distance: >3 FB Neck ROM: Full  Mouth opening: Limited Mouth Opening  Dental no notable dental hx.    Pulmonary    Pulmonary exam normal breath sounds clear to auscultation       Cardiovascular negative cardio ROS Normal cardiovascular exam Rhythm:Regular Rate:Normal     Neuro/Psych    GI/Hepatic negative GI ROS, Neg liver ROS,,,  Endo/Other  negative endocrine ROS    Renal/GU negative Renal ROS     Musculoskeletal   Abdominal   Peds  Hematology negative hematology ROS (+)   Anesthesia Other Findings   Reproductive/Obstetrics                             Anesthesia Physical Anesthesia Plan  ASA: 2  Anesthesia Plan: MAC   Post-op Pain Management:    Induction: Intravenous  PONV Risk Score and Plan: 4 or greater and Treatment may vary due to age or medical condition and Propofol infusion  Airway Management Planned: Natural Airway and Nasal Cannula  Additional Equipment: None  Intra-op Plan:   Post-operative Plan:   Informed Consent: I have reviewed the patients History and Physical, chart, labs and discussed the procedure including the risks, benefits and alternatives for the proposed anesthesia with the patient or authorized representative who has indicated his/her understanding and acceptance.     Dental advisory given  Plan Discussed with: CRNA and Anesthesiologist  Anesthesia Plan Comments: (EGD + Colon for Lynch sx)        Anesthesia Quick Evaluation

## 2022-06-28 ENCOUNTER — Ambulatory Visit (HOSPITAL_COMMUNITY)
Admission: RE | Admit: 2022-06-28 | Discharge: 2022-06-28 | Disposition: A | Discharge status: Home / Self Care | Payer: Commercial Managed Care - PPO | Attending: Gastroenterology | Admitting: Gastroenterology

## 2022-06-28 ENCOUNTER — Ambulatory Visit (HOSPITAL_COMMUNITY): Payer: Commercial Managed Care - PPO | Admitting: Anesthesiology

## 2022-06-28 ENCOUNTER — Encounter (HOSPITAL_COMMUNITY)
Admission: RE | Disposition: A | Discharge status: Home / Self Care | Payer: Self-pay | Source: Home / Self Care | Attending: Gastroenterology

## 2022-06-28 ENCOUNTER — Ambulatory Visit (HOSPITAL_BASED_OUTPATIENT_CLINIC_OR_DEPARTMENT_OTHER): Payer: Commercial Managed Care - PPO | Admitting: Anesthesiology

## 2022-06-28 ENCOUNTER — Other Ambulatory Visit: Payer: Self-pay

## 2022-06-28 ENCOUNTER — Encounter (HOSPITAL_COMMUNITY): Payer: Self-pay | Admitting: Gastroenterology

## 2022-06-28 DIAGNOSIS — Z923 Personal history of irradiation: Secondary | ICD-10-CM | POA: Insufficient documentation

## 2022-06-28 DIAGNOSIS — C189 Malignant neoplasm of colon, unspecified: Secondary | ICD-10-CM

## 2022-06-28 DIAGNOSIS — Z801 Family history of malignant neoplasm of trachea, bronchus and lung: Secondary | ICD-10-CM | POA: Diagnosis not present

## 2022-06-28 DIAGNOSIS — D496 Neoplasm of unspecified behavior of brain: Secondary | ICD-10-CM

## 2022-06-28 DIAGNOSIS — Z800 Family history of malignant neoplasm of digestive organs: Secondary | ICD-10-CM | POA: Insufficient documentation

## 2022-06-28 DIAGNOSIS — D125 Benign neoplasm of sigmoid colon: Secondary | ICD-10-CM

## 2022-06-28 DIAGNOSIS — Z808 Family history of malignant neoplasm of other organs or systems: Secondary | ICD-10-CM | POA: Diagnosis not present

## 2022-06-28 DIAGNOSIS — Q8589 Other phakomatoses, not elsewhere classified: Secondary | ICD-10-CM | POA: Diagnosis not present

## 2022-06-28 DIAGNOSIS — K5730 Diverticulosis of large intestine without perforation or abscess without bleeding: Secondary | ICD-10-CM

## 2022-06-28 DIAGNOSIS — Z86011 Personal history of benign neoplasm of the brain: Secondary | ICD-10-CM | POA: Diagnosis not present

## 2022-06-28 DIAGNOSIS — Z1509 Genetic susceptibility to other malignant neoplasm: Secondary | ICD-10-CM | POA: Insufficient documentation

## 2022-06-28 DIAGNOSIS — K635 Polyp of colon: Secondary | ICD-10-CM

## 2022-06-28 DIAGNOSIS — Z1211 Encounter for screening for malignant neoplasm of colon: Secondary | ICD-10-CM

## 2022-06-28 DIAGNOSIS — Z8489 Family history of other specified conditions: Secondary | ICD-10-CM

## 2022-06-28 HISTORY — PX: BIOPSY: SHX5522

## 2022-06-28 HISTORY — PX: COLONOSCOPY WITH PROPOFOL: SHX5780

## 2022-06-28 HISTORY — PX: POLYPECTOMY: SHX5525

## 2022-06-28 HISTORY — PX: ESOPHAGOGASTRODUODENOSCOPY (EGD) WITH PROPOFOL: SHX5813

## 2022-06-28 SURGERY — Surgical Case
Anesthesia: *Unknown

## 2022-06-28 SURGERY — ESOPHAGOGASTRODUODENOSCOPY (EGD) WITH PROPOFOL
Anesthesia: Monitor Anesthesia Care

## 2022-06-28 MED ORDER — PROPOFOL 10 MG/ML IV BOLUS
INTRAVENOUS | Status: DC | PRN
  Administered 2022-06-28: 20 mg via INTRAVENOUS
  Administered 2022-06-28 (×2): 30 mg via INTRAVENOUS

## 2022-06-28 MED ORDER — ONDANSETRON HCL 4 MG/2ML IJ SOLN
INTRAMUSCULAR | Status: DC | PRN
  Administered 2022-06-28: 4 mg via INTRAVENOUS

## 2022-06-28 MED ORDER — SODIUM CHLORIDE 0.9 % IV SOLN: INTRAVENOUS | Status: DC

## 2022-06-28 MED ORDER — LIDOCAINE 2% (20 MG/ML) 5 ML SYRINGE
INTRAMUSCULAR | Status: DC | PRN
  Administered 2022-06-28: 80 mg via INTRAVENOUS

## 2022-06-28 MED ORDER — PROPOFOL 500 MG/50ML IV EMUL
INTRAVENOUS | Status: DC | PRN
  Administered 2022-06-28: 125 ug/kg/min via INTRAVENOUS

## 2022-06-28 MED ORDER — LACTATED RINGERS IV SOLN: INTRAVENOUS | Status: DC

## 2022-06-28 MED ORDER — PROPOFOL 1000 MG/100ML IV EMUL
INTRAVENOUS | Status: AC
  Filled 2022-06-28: qty 100

## 2022-06-28 SURGICAL SUPPLY — 25 items

## 2022-06-28 NOTE — Transfer of Care (Signed)
Immediate Anesthesia Transfer of Care Note  Patient: Laurie Kelley  Procedure(s) Performed: ESOPHAGOGASTRODUODENOSCOPY (EGD) WITH PROPOFOL COLONOSCOPY WITH PROPOFOL  Patient Location: PACU  Anesthesia Type:MAC  Level of Consciousness: awake, alert , and oriented  Airway & Oxygen Therapy: Patient Spontanous Breathing and Patient connected to face mask oxygen  Post-op Assessment: Report given to RN and Post -op Vital signs reviewed and stable  Post vital signs: Reviewed and stable  Last Vitals:  Vitals Value Taken Time  BP    Temp    Pulse 91 06/28/22 1010  Resp 23 06/28/22 1010  SpO2 100 % 06/28/22 1010  Vitals shown include unvalidated device data.  Last Pain:  Vitals:   06/28/22 0847  TempSrc: Temporal  PainSc: 7       Patients Stated Pain Goal: 5 (57/47/34 0370)  Complications: No notable events documented.

## 2022-06-28 NOTE — Op Note (Signed)
[facility] Patient Name: Laurie Kelley Procedure Date: 06/28/2022 MRN: [id number] Attending MD: Gerrit Heck , MD, [id number] Date of Birth: 1970/09/16 CSN: [id number] Age: 52 Admit Type: Outpatient Procedure:                Colonoscopy Indications:              Lynch Syndrome with PMS2 gene mutation.                           No previous colonoscopy. Providers:                Gerrit Heck, MD, [address], Luan Moore, Technician, [facility], CRNA Referring MD:              Medicines:                Monitored Anesthesia Care Complications:            No immediate complications. Estimated Blood Loss:     Estimated blood loss was minimal. Procedure:                Pre-Anesthesia Assessment:                           - Prior to the procedure, a History and Physical                            was performed, and patient medications and                            allergies were reviewed. The patient's tolerance of                            previous anesthesia was also reviewed. The risks                            and benefits of the procedure and the sedation                            options and risks were discussed with the patient.                            All questions were answered, and informed consent                            was obtained. Prior Anticoagulants: The patient has                            taken no anticoagulant or antiplatelet agents                            except for aspirin. ASA Grade Assessment: II - A  patient with mild systemic disease. After reviewing                            the risks and benefits, the patient was deemed in                            satisfactory condition to undergo the procedure.                           After obtaining informed consent, the colonoscope                            was passed under direct vision. Throughout the                             procedure, the patient's blood pressure, pulse, and                            oxygen saturations were monitored continuously. The                            CF-HQ190L ([id number]) Olympus colonoscope was                            introduced through the anus and advanced to the the                            terminal ileum. The colonoscopy was performed                            without difficulty. The patient tolerated the                            procedure well. The quality of the bowel                            preparation was good. The terminal ileum, ileocecal                            valve, appendiceal orifice, and rectum were                            photographed. Scope In: 9:41:31 AM Scope Out: 10:02:44 AM Scope Withdrawal Time: 0 hours 13 minutes 3 seconds  Total Procedure Duration: 0 hours 21 minutes 13 seconds  Findings:      The perianal and digital rectal examinations were normal.      Two sessile polyps were found in the sigmoid colon. The polyps were 2 to       3 mm in size. These polyps were removed with a cold snare. Resection and       retrieval were complete. Estimated blood loss was minimal.      Multiple medium-mouthed and small-mouthed diverticula were found in the       sigmoid colon and descending colon. The remainder of the colon was  otherwise normal appearing.      The retroflexed view of the distal rectum and anal verge was normal and       showed no anal or rectal abnormalities.      The terminal ileum appeared normal. Impression:               - Two 2 to 3 mm polyps in the sigmoid colon,                            removed with a cold snare. Resected and retrieved.                           - Diverticulosis in the sigmoid colon and in the                            descending colon.                           - The distal rectum and anal verge are normal on                            retroflexion view.                           -  The examined portion of the ileum was normal. Moderate Sedation:      Not Applicable - Patient had care per Anesthesia. Recommendation:           - Patient has a contact number available for                            emergencies. The signs and symptoms of potential                            delayed complications were discussed with the                            patient. Return to normal activities tomorrow.                            Written discharge instructions were provided to the                            patient.                           - Resume previous diet.                           - Continue present medications.                           - Await pathology results.                           - Repeat colonoscopy in 2 years for continued  surveillance.                           - Return to GI office in 1 year, or sooner prn. Procedure Code(s):        --- Professional ---                           [id number], Colonoscopy, flexible; with removal of                            tumor(s), polyp(s), or other lesion(s) by snare                            technique Diagnosis Code(s):        --- Professional ---                           D12.5, Benign neoplasm of sigmoid colon                           Z15.09, Genetic susceptibility to other malignant                            neoplasm                           K57.30, Diverticulosis of large intestine without                            perforation or abscess without bleeding CPT copyright 2022 American Medical Association. All rights reserved. The codes documented in this report are preliminary and upon coder review may  be revised to meet current compliance requirements. Gerrit Heck, MD 06/28/2022 10:13:05 AM Number of Addenda: 0

## 2022-06-28 NOTE — H&P (Signed)
GASTROENTEROLOGY PROCEDURE H&P NOTE   Primary Care Physician: Janora Norlander, DO    Reason for Procedure:  Lynch Syndrome screening  Plan:    EGD, colonoscopy  Patient is appropriate for endoscopic procedure(s) at [facility] unit  The nature of the procedure, as well as the risks, benefits, and alternatives were carefully and thoroughly reviewed with the patient. Ample time for discussion and questions allowed. The patient understood, was satisfied, and agreed to proceed.     HPI: Laurie Kelley is a 52 y.o. female who presents for EGD and colonoscopy for increased risk screening due to history of Lynch Syndrome with PMS2 gene mutation.  Due to her history of meningiomas requiring surgery and radiation, procedures are scheduled at [facility] due to elevated periprocedural risks.  Past Medical History:  Diagnosis Date   Blood transfusion without reported diagnosis    Brain tumor (benign) ([place])    12 total, 4 have been removed   Cancer ([place])    Chest pain in adult 07/31/2017   Family history of brain tumor    Family history of brain tumor 06/27/2021   Gallstones    Headache    Hyperlipidemia with target LDL less than 100 08/18/2015   Internal hemorrhoids 08/18/2014   Lynch syndrome    PONV (postoperative nausea and vomiting)     Past Surgical History:  Procedure Laterality Date   ABDOMINAL HYSTERECTOMY N/A 03/08/2022   Procedure: HYSTERECTOMY ABDOMINAL;  Surgeon: Janyth Pupa, DO;  Location: [facility];  Service: Gynecology;  Laterality: N/A;   APPLICATION OF CRANIAL NAVIGATION Left 02/18/2021   Procedure: APPLICATION OF CRANIAL NAVIGATION;  Surgeon: Judith Part, MD;  Location: [place];  Service: Neurosurgery;  Laterality: Left;   APPLICATION OF CRANIAL NAVIGATION N/A 05/18/2021   Procedure: APPLICATION OF CRANIAL NAVIGATION;  Surgeon: Judith Part, MD;  Location: [place];  Service: Neurosurgery;  Laterality: N/A;   BRAIN SURGERY      CHOLECYSTECTOMY  1993   CRANIOTOMY Left 02/18/2021   Procedure: Left orbitozygomatic craniotomy for tumor resection  with Brain Lab;  Surgeon: Judith Part, MD;  Location: [place];  Service: Neurosurgery;  Laterality: Left;   CRANIOTOMY Right 05/18/2021   Procedure: Right Orbitozygomatic craniotomy for tumor with brainlab;  Surgeon: Judith Part, MD;  Location: [place];  Service: Neurosurgery;  Laterality: Right;   SALPINGOOPHORECTOMY Bilateral 03/08/2022   Procedure: OPEN SALPINGO OOPHORECTOMY;  Surgeon: Janyth Pupa, DO;  Location: [facility];  Service: Gynecology;  Laterality: Bilateral;   SHOULDER ARTHROSCOPY Right 11/06/2019   Procedure: ARTHROSCOPY RIGHT SHOULDER, DEBRIDEMENT X TWO STRUCTURES; OPEN ROTATOR CUFF REPAIR;  Surgeon: Carole Civil, MD;  Location: [facility];  Service: Orthopedics;  Laterality: Right;    Prior to Admission medications   Medication Sig Start Date End Date Taking? Authorizing Provider  atorvastatin (LIPITOR) 20 MG tablet Take 1 tablet (20 mg total) by mouth daily. 12/06/21  Yes Gottschalk, Ashly M, DO  BIOTIN PO Take 1 tablet by mouth daily.   Yes [provider]  Cholecalciferol (DIALYVITE VITAMIN D 5000) 125 MCG (5000 UT) capsule Take 5,000 Units by mouth daily.   Yes [provider]  gabapentin (NEURONTIN) 400 MG capsule Take 400 mg by mouth 3 (three) times daily.   Yes [provider]  hydrocortisone 1 % ointment Apply 1 Application topically 2 (two) times daily as needed for itching.   Yes [provider]    Current Facility-Administered Medications  Medication  Dose Route Frequency Provider Last Rate Last Admin   0.9 %  sodium chloride infusion   Intravenous Continuous Jeydan Barner V, DO        Allergies as of 06/14/2022 - Review Complete 06/05/2022  Allergen Reaction Noted   Gadolinium derivatives Other (See Comments) 12/19/2021   Saline Nausea And Vomiting 08/22/2021    Family History  Problem  Relation Age of Onset   Hyperlipidemia Mother    Heart disease Father    Other Sister        meningiomas/tumors x11   Brain cancer Maternal Aunt        d. 41s-60s   Lung cancer Maternal Aunt    Bone cancer Maternal Aunt        d. 50s-60s   Liver cancer Paternal Uncle    Lung cancer Paternal Grandmother        d.85   Other Daughter        Lynch syndrome   Rectal cancer Neg Hx    Esophageal cancer Neg Hx    Stomach cancer Neg Hx    Breast cancer Neg Hx    Colon polyps Neg Hx    Colon cancer Neg Hx     Social History   Socioeconomic History   Marital status: Divorced    Spouse name: Not on file   Number of children: 2   Years of education: Not on file   Highest education level: Not on file  Occupational History   Not on file  Tobacco Use   Smoking status: Never   Smokeless tobacco: Never  Vaping Use   Vaping Use: Never used  Substance and Sexual Activity   Alcohol use: No   Drug use: No   Sexual activity: Not Currently    Birth control/protection: Injection  Other Topics Concern   Not on file  Social History Narrative   Not on file   Social Determinants of Health   Financial Resource Strain: Low Risk  (01/23/2022)   Overall Financial Resource Strain (CARDIA)    Difficulty of Paying Living Expenses: Not hard at all  Food Insecurity: Not on file  Transportation Needs: Not on file  Physical Activity: Not on file  Stress: Not on file  Social Connections: Not on file  Intimate Partner Violence: Not on file    Physical Exam: Vital signs in last 24 hours: '@There'$  were no vitals taken for this visit. GEN: NAD EYE: Sclerae anicteric ENT: MMM CV: Non-tachycardic Pulm: CTA b/l GI: Soft, NT/ND NEURO:  Alert & Oriented x 3   Gerrit Heck, DO [facility]   06/28/2022 8:33 AM

## 2022-06-28 NOTE — Interval H&P Note (Signed)
History and Physical Interval Note:  06/28/2022 8:36 AM  Laurie Kelley  has presented today for surgery, with the diagnosis of lynch syndrome.  The various methods of treatment have been discussed with the patient and family. After consideration of risks, benefits and other options for treatment, the patient has consented to  Procedure(s): ESOPHAGOGASTRODUODENOSCOPY (EGD) WITH PROPOFOL (N/A) COLONOSCOPY WITH PROPOFOL (N/A) as a surgical intervention.  The patient's history has been reviewed, patient examined, no change in status, stable for surgery.  I have reviewed the patient's chart and labs.  Questions were answered to the patient's satisfaction.     [name] Iverna Hammac

## 2022-06-28 NOTE — Discharge Instructions (Signed)
YOU HAD AN ENDOSCOPIC PROCEDURE TODAY: Refer to the procedure report and other information in the discharge instructions given to you for any specific questions about what was found during the examination. If this information does not answer your questions, please call [place] office at [phone] to clarify.  ° °YOU SHOULD EXPECT: Some feelings of bloating in the abdomen. Passage of more gas than usual. Walking can help get rid of the air that was put into your GI tract during the procedure and reduce the bloating. If you had a lower endoscopy (such as a colonoscopy or flexible sigmoidoscopy) you may notice spotting of blood in your stool or on the toilet paper. Some abdominal soreness may be present for a day or two, also. ° °DIET: Your first meal following the procedure should be a light meal and then it is ok to progress to your normal diet. A half-sandwich or bowl of soup is an example of a good first meal. Heavy or fried foods are harder to digest and may make you feel nauseous or bloated. Drink plenty of fluids but you should avoid alcoholic beverages for 24 hours. If you had a esophageal dilation, please see attached instructions for diet.   ° °ACTIVITY: Your care partner should take you home directly after the procedure. You should plan to take it easy, moving slowly for the rest of the day. You can resume normal activity the day after the procedure however YOU SHOULD NOT DRIVE, use power tools, machinery or perform tasks that involve climbing or major physical exertion for 24 hours (because of the sedation medicines used during the test).  ° °SYMPTOMS TO REPORT IMMEDIATELY: °A gastroenterologist can be reached at any hour. Please call [phone]  for any of the following symptoms:  °Following lower endoscopy (colonoscopy, flexible sigmoidoscopy) °Excessive amounts of blood in the stool  °Significant tenderness, worsening of abdominal pains  °Swelling of the abdomen that is new, acute  °Fever of 100° or  higher  °Following upper endoscopy (EGD, EUS, ERCP, esophageal dilation) °Vomiting of blood or coffee ground material  °New, significant abdominal pain  °New, significant chest pain or pain under the shoulder blades  °Painful or persistently difficult swallowing  °New shortness of breath  °Black, tarry-looking or red, bloody stools ° °FOLLOW UP:  °If any biopsies were taken you will be contacted by phone or by letter within the next 1-3 weeks. Call [phone]  if you have not heard about the biopsies in 3 weeks.  °Please also call with any specific questions about appointments or follow up tests. ° °

## 2022-06-28 NOTE — Anesthesia Postprocedure Evaluation (Signed)
Anesthesia Post Note  Patient: Laurie Kelley  Procedure(s) Performed: ESOPHAGOGASTRODUODENOSCOPY (EGD) WITH PROPOFOL COLONOSCOPY WITH PROPOFOL BIOPSY POLYPECTOMY     Patient location during evaluation: Endoscopy Anesthesia Type: MAC Level of consciousness: awake and alert Pain management: pain level controlled Vital Signs Assessment: post-procedure vital signs reviewed and stable Respiratory status: spontaneous breathing, nonlabored ventilation, respiratory function stable and patient connected to nasal cannula oxygen Cardiovascular status: blood pressure returned to baseline and stable Postop Assessment: no apparent nausea or vomiting Anesthetic complications: no  No notable events documented.  Last Vitals:  Vitals:   06/28/22 1020 06/28/22 1030  BP: 136/71 136/62  Pulse: 92 72  Resp: (!) 25 14  Temp:    SpO2: 99% 100%    Last Pain:  Vitals:   06/28/22 1030  TempSrc:   PainSc: 0-No pain                 Barnet Glasgow

## 2022-06-28 NOTE — Op Note (Signed)
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 Providers:                Gerrit Heck, MD, [address] Luan Moore, Technician, [facility], CRNA Referring MD:              Medicines:                Monitored Anesthesia Care Complications:            No immediate complications. Estimated Blood Loss:     Estimated blood loss was minimal. Procedure:                Pre-Anesthesia Assessment:                           - Prior to the procedure, a History and Physical                            was performed, and patient medications and                            allergies were reviewed. The patient's tolerance of                            previous anesthesia was also reviewed. The risks                            and benefits of the procedure and the sedation                            options and risks were discussed with the patient.                            All questions were answered, and informed consent                            was obtained. Prior Anticoagulants: The patient has                            taken no anticoagulant or antiplatelet agents. ASA                            Grade Assessment: II - A patient with mild systemic  disease. After reviewing the risks and benefits,                            the patient was deemed in satisfactory condition to                            undergo the procedure.                           After obtaining informed consent, the endoscope was                            passed under direct vision. Throughout the                             procedure, the patient's blood pressure, pulse, and                            oxygen saturations were monitored continuously. The                            [id number]-[id number] ([id number]) Olympus endoscope was introduced                            through the mouth, and advanced to the second part                            of duodenum. The upper GI endoscopy was                            accomplished without difficulty. The patient                            tolerated the procedure well. Scope In: Scope Out: Findings:      The examined esophagus was normal.      The entire examined stomach was normal. Biopsies were taken throughout       the stomach with a cold forceps for histology. Estimated blood loss was       minimal.      The examined duodenum was normal. Impression:               - Normal esophagus.                           - Normal stomach. Biopsied.                           - Normal examined duodenum. Moderate Sedation:      Not Applicable - Patient had care per Anesthesia. Recommendation:           - Await pathology results.                           - Repeat upper endoscopy in 2-4 years for screening  purposes.                           - Perform a colonoscopy today. Procedure Code(s):        --- Professional ---                           [phone], Esophagogastroduodenoscopy, flexible,                            transoral; with biopsy, single or multiple Diagnosis Code(s):        --- Professional ---                           C18.9, Malignant neoplasm of colon, unspecified                           Z15.09, Genetic susceptibility to other malignant                            neoplasm CPT copyright 2022 American Medical Association. All rights reserved. The codes documented in this report are preliminary and upon coder review may  be revised to meet current compliance requirements. Gerrit Heck, MD 06/28/2022 10:08:51 AM Number of  Addenda: 0

## 2022-06-30 ENCOUNTER — Encounter (HOSPITAL_COMMUNITY): Payer: Self-pay | Admitting: Gastroenterology

## 2022-06-30 LAB — SURGICAL PATHOLOGY

## 2022-07-06 ENCOUNTER — Other Ambulatory Visit: Payer: Self-pay

## 2022-07-06 MED ORDER — DICYCLOMINE HCL 10 MG PO CAPS: 10.0000 mg | ORAL_CAPSULE | Freq: Four times a day (QID) | ORAL | 1 refills | Status: DC | PRN

## 2022-07-07 ENCOUNTER — Telehealth: Payer: Commercial Managed Care - PPO | Admitting: Plastic Surgery

## 2022-07-26 DIAGNOSIS — H9190 Unspecified hearing loss, unspecified ear: Secondary | ICD-10-CM | POA: Insufficient documentation

## 2022-07-27 ENCOUNTER — Telehealth: Payer: Self-pay | Admitting: Radiation Therapy

## 2022-07-27 NOTE — Telephone Encounter (Signed)
Called pt to see how she is doing and what were the recommendation from her second opinion. Her response was "I will not be coming back to be seen there." I asked if she will be having more surgery, she replied, "You do not need to know that."  I will remove her from our follow-up list per her request.   Mont Dutton R.T.(R)(T) Radiation Special Procedures Navigator

## 2022-07-31 DIAGNOSIS — D329 Benign neoplasm of meninges, unspecified: Secondary | ICD-10-CM | POA: Insufficient documentation

## 2022-08-08 ENCOUNTER — Other Ambulatory Visit: Payer: Self-pay

## 2022-08-08 ENCOUNTER — Encounter (INDEPENDENT_AMBULATORY_CARE_PROVIDER_SITE_OTHER): Payer: Commercial Managed Care - PPO | Admitting: Family Medicine

## 2022-08-08 ENCOUNTER — Ambulatory Visit (INDEPENDENT_AMBULATORY_CARE_PROVIDER_SITE_OTHER): Payer: Commercial Managed Care - PPO

## 2022-08-08 ENCOUNTER — Other Ambulatory Visit: Payer: Commercial Managed Care - PPO

## 2022-08-08 DIAGNOSIS — R052 Subacute cough: Secondary | ICD-10-CM | POA: Diagnosis not present

## 2022-08-08 DIAGNOSIS — Z8616 Personal history of COVID-19: Secondary | ICD-10-CM

## 2022-08-08 DIAGNOSIS — R051 Acute cough: Secondary | ICD-10-CM

## 2022-08-08 DIAGNOSIS — U071 COVID-19: Secondary | ICD-10-CM

## 2022-08-08 DIAGNOSIS — R0602 Shortness of breath: Secondary | ICD-10-CM

## 2022-08-08 NOTE — Telephone Encounter (Signed)
Please see the MyChart message reply(ies) for my assessment and plan.    This patient gave consent for this Medical Advice Message and is aware that it may result in a bill to [employer], as well as the possibility of receiving a bill for a co-payment or deductible. They are an established patient, but are not seeking medical advice exclusively about a problem treated during an in person or video visit in the last seven days. I did not recommend an in person or video visit within seven days of my reply.    I spent a total of 13 minutes cumulative time within 7 days through [employer].  Ronnie Doss, DO

## 2022-08-17 ENCOUNTER — Encounter: Payer: Self-pay | Admitting: Radiology

## 2022-09-05 ENCOUNTER — Telehealth: Payer: Self-pay | Admitting: Plastic Surgery

## 2022-09-05 NOTE — Telephone Encounter (Signed)
Pt called and had questions to if this would be covered under insurance or cosmetic, Consult, Filler for head, Dr Harvel Ricks said she was good to get filler. Pt wanted to know if this would be through insurance or cosmetic to do the filler on head from her brain tumor// Pt wanted to know before her apt. Pt spoke with Dr Marla Roe 07-07-22 on a televisit about benign neoplasm of cerebral menings.

## 2022-09-12 ENCOUNTER — Encounter: Payer: Self-pay | Admitting: Family Medicine

## 2022-09-13 ENCOUNTER — Ambulatory Visit (INDEPENDENT_AMBULATORY_CARE_PROVIDER_SITE_OTHER): Payer: Self-pay | Admitting: Plastic Surgery

## 2022-09-13 DIAGNOSIS — Q670 Congenital facial asymmetry: Secondary | ICD-10-CM

## 2022-09-13 DIAGNOSIS — Z9889 Other specified postprocedural states: Secondary | ICD-10-CM

## 2022-09-13 NOTE — Progress Notes (Signed)
The patient is a 52 year old female here for evaluation of her forehead and temporal area.  She had tumors removed from her brain and as a result has some deficits in volume.  We talked about the different options and she wanted to try to do some filler.  We did the Restylane L in the area of the forehead that was just to the left of midline.  That had some nice improvement and then we did the lateral forehead orbital area on the left.  1 syringe total was used.  I showed her the school to try to better explain how much filler would be needed.  She is in agreement to do fat grafting now that she realizes how much it would take and that it would not be permanent.  Terrell technique was used for the filler and the patient did very well.  Restylane L LOT: [id number]   Pictures were obtained of the patient and placed in the chart with the patient's or guardian's permission.

## 2022-09-19 ENCOUNTER — Encounter: Payer: Self-pay | Admitting: Family Medicine

## 2022-09-19 ENCOUNTER — Ambulatory Visit (INDEPENDENT_AMBULATORY_CARE_PROVIDER_SITE_OTHER): Payer: Commercial Managed Care - PPO | Admitting: Family Medicine

## 2022-09-19 VITALS — BP 124/85 | HR 70 | Temp 98.2°F | Ht 65.0 in | Wt 173.0 lb

## 2022-09-19 DIAGNOSIS — Z1331 Encounter for screening for depression: Secondary | ICD-10-CM

## 2022-09-19 DIAGNOSIS — Z9889 Other specified postprocedural states: Secondary | ICD-10-CM

## 2022-09-19 DIAGNOSIS — R2689 Other abnormalities of gait and mobility: Secondary | ICD-10-CM | POA: Insufficient documentation

## 2022-09-19 DIAGNOSIS — R519 Headache, unspecified: Secondary | ICD-10-CM | POA: Diagnosis not present

## 2022-09-19 DIAGNOSIS — Z1509 Genetic susceptibility to other malignant neoplasm: Secondary | ICD-10-CM

## 2022-09-19 DIAGNOSIS — D496 Neoplasm of unspecified behavior of brain: Secondary | ICD-10-CM

## 2022-09-19 DIAGNOSIS — H5462 Unqualified visual loss, left eye, normal vision right eye: Secondary | ICD-10-CM | POA: Insufficient documentation

## 2022-09-19 NOTE — Patient Instructions (Signed)
Tegretol might be a pain controller option Lyrica is an alternative to gabapentin.

## 2022-09-19 NOTE — Progress Notes (Signed)
Subjective: CC: Chronic follow-up PCP: Janora Norlander, DO [id number] Laurie Kelley is a 52 y.o. female presenting to clinic today for:  1.  History of brain tumor status postresection Patient continues to have right-sided facial pain that pretty much runs of along the trigeminal nerve.  She reports that they recently did a nerve block but she really did not see any significant difference in the pain that she experiences.  They increased her gabapentin to 600 mg 3 times daily but again, no significant improvement in symptoms.  She will be following up soon to discuss next steps.  She is currently under the care of of a neurologist out in [place] and seems to have a very good relationship with them.  She has been talking to Dr. Marla Roe with plastic surgery in [place] about taking fat and transplanting it into the temporal area, much of where she is having pain.  She hopes that this will help alleviate some of the discomfort she is experiencing.  She does not care about the physical appearance in the area but really is looking for alleviation of pain.  She is regaining some of the hearing in that right ear after tube placement.  Still worries about right vision given the tumors that are in the frontal area that unfortunately are affecting that right retina.  She also continues to have pain along the zygomatic process on the right due to tumor.  Continues to have balance issues and asks for a handicap placard today if possible.    On a positive note, one of her daughters is about to deliver a grandson soon and she is really looking forward to this.   ROS: Per HPI  Allergies  Allergen Reactions   Gadolinium Derivatives Other (See Comments)    Pt reports she developed chest tightness, burning of her back, and weakness after MRI contrast 11/2021. Per Dr. Jobe Igo, he suggests 13 hr protocol prior to MRI.    Saline Nausea And Vomiting   Past Medical History:  Diagnosis Date   Blood  transfusion without reported diagnosis    Brain tumor (benign)    12 total, 4 have been removed   Cancer    Chest pain in adult 07/31/2017   Family history of brain tumor    Family history of brain tumor 06/27/2021   Gallstones    Headache    Hyperlipidemia with target LDL less than 100 08/18/2015   Internal hemorrhoids 08/18/2014   Lynch syndrome    PONV (postoperative nausea and vomiting)     Current Outpatient Medications:    atorvastatin (LIPITOR) 20 MG tablet, Take 1 tablet (20 mg total) by mouth daily., Disp: 90 tablet, Rfl: 3   BIOTIN PO, Take 1 tablet by mouth daily., Disp: , Rfl:    Cholecalciferol (DIALYVITE VITAMIN D 5000) 125 MCG (5000 UT) capsule, Take 5,000 Units by mouth daily., Disp: , Rfl:    gabapentin (NEURONTIN) 400 MG capsule, Take 400 mg by mouth 3 (three) times daily., Disp: , Rfl:    hydrocortisone 1 % ointment, Apply 1 Application topically 2 (two) times daily as needed for itching., Disp: , Rfl:  Social History   Socioeconomic History   Marital status: Divorced    Spouse name: Not on file   Number of children: 2   Years of education: Not on file   Highest education level: Associate degree: occupational, Hotel manager, or vocational program  Occupational History   Not on file  Tobacco Use   Smoking status:  Never   Smokeless tobacco: Never  Vaping Use   Vaping Use: Never used  Substance and Sexual Activity   Alcohol use: No   Drug use: No   Sexual activity: Not Currently    Birth control/protection: Injection  Other Topics Concern   Not on file  Social History Narrative   Not on file   Social Determinants of Health   Financial Resource Strain: Low Risk  (09/15/2022)   Overall Financial Resource Strain (CARDIA)    Difficulty of Paying Living Expenses: Not hard at all  Food Insecurity: No Food Insecurity (09/15/2022)   Hunger Vital Sign    Worried About Running Out of Food in the Last Year: Never true    Ran Out of Food in the Last Year: Never  true  Transportation Needs: No Transportation Needs (09/15/2022)   PRAPARE - Hydrologist (Medical): No    Lack of Transportation (Non-Medical): No  Physical Activity: Unknown (09/15/2022)   Exercise Vital Sign    Days of Exercise per Week: 0 days    Minutes of Exercise per Session: Not on file  Stress: Not on file  Social Connections: Socially Isolated (09/15/2022)   Social Connection and Isolation Panel [NHANES]    Frequency of Communication with Friends and Family: More than three times a week    Frequency of Social Gatherings with Friends and Family: Once a week    Attends Religious Services: Never    Marine scientist or Organizations: No    Attends Music therapist: Not on file    Marital Status: Divorced  Human resources officer Violence: Not on file   Family History  Problem Relation Age of Onset   Hyperlipidemia Mother    Heart disease Father    Other Sister        meningiomas/tumors x11   Brain cancer Maternal Aunt        d. 12s-60s   Lung cancer Maternal Aunt    Bone cancer Maternal Aunt        d. 50s-60s   Liver cancer Paternal Uncle    Lung cancer Paternal Grandmother        d.85   Other Daughter        Lynch syndrome   Rectal cancer Neg Hx    Esophageal cancer Neg Hx    Stomach cancer Neg Hx    Breast cancer Neg Hx    Colon polyps Neg Hx    Colon cancer Neg Hx     Objective: Office vital signs reviewed. BP 124/85   Pulse 70   Temp 98.2 F (36.8 C)   Ht 5\' 5"  (1.651 m)   Wt 173 lb (78.5 kg)   SpO2 99%   BMI 28.79 kg/m   Physical Examination:  General: Awake, alert, No acute distress HEENT: Sclera white.  She has temporal wasting on the right Cardio: regular rate and rhythm  Neuro: Follows commands.  Speech is normal.  Sometimes she pauses for recollection but seems to be much quicker and thought process and speech compared to our previous exam Psych: She seems to be in much better spirits than  previous.     09/19/2022    1:46 PM 01/23/2022    8:41 AM 12/06/2021   11:17 AM  Depression screen PHQ 2/9  Decreased Interest 0 0 0  Down, Depressed, Hopeless 1 0 0  PHQ - 2 Score 1 0 0  Altered sleeping 0 1   Tired, decreased  energy 3 1   Change in appetite 0 0   Feeling bad or failure about yourself  0 0   Trouble concentrating 3 0   Moving slowly or fidgety/restless 3 0   Suicidal thoughts 0 0   PHQ-9 Score 10 2   Difficult doing work/chores Very difficult        09/19/2022    2:04 PM 09/19/2022    1:46 PM 01/23/2022    8:41 AM 12/06/2021   11:17 AM  GAD 7 : Generalized Anxiety Score  Nervous, Anxious, on Edge 0 0 0 0  Control/stop worrying 3 0 1 0  Worry too much - different things 3 0 0 0  Trouble relaxing 1 0 0 0  Restless 0 0 0 0  Easily annoyed or irritable 1 0 0 0  Afraid - awful might happen 3 0 0 0  Total GAD 7 Score 11 0 1 0  Anxiety Difficulty Very difficult Not difficult at all  Not difficult at all    Assessment/ Plan: 52 y.o. female   Brain tumor  Status post craniotomy  Right sided facial pain  PMS2-related Lynch syndrome (HNPCC4)  Balance problem  Vision loss of left eye  Positive screening for depression on 9-item Patient Health Questionnaire (PHQ-9)  She continues to struggle with the aftereffects and ongoing issues related to the multiple tumors that she has had intracranially and along the right zygomatic process.  Unfortunately continues to suffer from quite a bit of pain despite attempts to alleviate this pain with injection therapy and medications.  We discussed potential options including driving that gabapentin dose higher but unfortunately this could also result in increased sedation and balance issues.  I also wonder if she might benefit from something like Tegretol.  I encouraged her to discuss this with her specialist and I will attempt to CC this chart to them as well.  Unfortunately her treatment of these residual tumors is very limited  secondary to the Lynch syndrome  She has excellent support by her family.  She did have positive PHQ-9 and GAD-7 scores today.  This mostly seems to stem from the uncertain nature of her tumors and the chronic pain she has been dealing with after surgery.  I would like to reassess this in the next several months and we should consider starting medication for depression and anxiety if these are not getting better.  She of course can see me anytime prior to that if she wants to initiate meds for mood  I have completed her handicap placard form and returned to her    No orders of the defined types were placed in this encounter.  No orders of the defined types were placed in this encounter.    Janora Norlander, DO [place] [phone]

## 2022-09-27 DIAGNOSIS — Z0289 Encounter for other administrative examinations: Secondary | ICD-10-CM

## 2022-10-09 ENCOUNTER — Encounter: Payer: Self-pay | Admitting: Family Medicine

## 2022-10-10 ENCOUNTER — Encounter: Payer: Self-pay | Admitting: Family Medicine

## 2022-10-10 ENCOUNTER — Ambulatory Visit (INDEPENDENT_AMBULATORY_CARE_PROVIDER_SITE_OTHER): Payer: Commercial Managed Care - PPO | Admitting: Family Medicine

## 2022-10-10 VITALS — BP 119/75 | HR 69 | Temp 98.2°F | Ht 65.0 in | Wt 176.0 lb

## 2022-10-10 DIAGNOSIS — R569 Unspecified convulsions: Secondary | ICD-10-CM | POA: Diagnosis not present

## 2022-10-10 DIAGNOSIS — R55 Syncope and collapse: Secondary | ICD-10-CM

## 2022-10-10 DIAGNOSIS — D496 Neoplasm of unspecified behavior of brain: Secondary | ICD-10-CM | POA: Diagnosis not present

## 2022-10-10 LAB — CBC WITH DIFFERENTIAL/PLATELET
Eos: 1 %
Immature Grans (Abs): 0 10*3/uL (ref 0.0–0.1)
Immature Granulocytes: 0 %
MCH: 29.7 pg (ref 26.6–33.0)
MCV: 91 fL (ref 79–97)
Monocytes Absolute: 0.3 10*3/uL (ref 0.1–0.9)
Monocytes: 5 %
Neutrophils: 69 %

## 2022-10-10 LAB — CMP14+EGFR

## 2022-10-10 NOTE — Progress Notes (Signed)
New Patient Office Visit  Subjective   Patient ID: Laurie Kelley, female    DOB: 08/13/1970  Age: 52 y.o. MRN: [id number]  CC:  Chief Complaint  Patient presents with   Follow-up    ED follow up   HPI Laurie Kelley presents for ED follow up  She was outside helping her brother clean up sticks in her yard. States that she was talking to her brother and his girlfriend when she had to sit down because she started feeling bad. She started seeing spots in her vision. She asked them to help her to the house, when she stood, she passed out. Per her brother's report, the LOC lasted a "couple of minutes" and she was shaking.  She woke back up, went into the house cooled off and then her father took her to the ED. Denies history of seizures. Denies loss of bladder control and did not bite her tongue during the episode. States that "normally when she blacks out" she can feel it happen and feel the fall. Previous syncopal episodes are attributed to high fever and dehydration. States that it has happened 3 times over 30 years. She has a history of brain tumors and follows closely with neurosurgery. She has relayed everything to her neurosurgeon already and is waiting for their reply.  States that she has chronic headache, that her headache was increased on Saturday and Sunday. It has since reduced to the normal pain.  Denies any new complaints.   Reports that she has been taking Vitamin D supplement for several years for history of deficiency.   Outpatient Encounter Medications as of 10/10/2022  Medication Sig   atorvastatin (LIPITOR) 20 MG tablet Take 1 tablet (20 mg total) by mouth daily.   BIOTIN PO Take 1 tablet by mouth daily.   Cholecalciferol (DIALYVITE VITAMIN D 5000) 125 MCG (5000 UT) capsule Take 5,000 Units by mouth daily.   gabapentin (NEURONTIN) 600 MG tablet Take 600 mg by mouth 3 (three) times daily.   hydrocortisone 1 % ointment Apply 1 Application topically 2 (two) times daily as  needed for itching.   loratadine-pseudoephedrine (CLARITIN-D 24-HOUR) 10-240 MG 24 hr tablet Take 1 tablet by mouth as needed.   [DISCONTINUED] gabapentin (NEURONTIN) 400 MG capsule Take 400 mg by mouth 3 (three) times daily.   No facility-administered encounter medications on file as of 10/10/2022.   Past Medical History:  Diagnosis Date   Blood transfusion without reported diagnosis    Brain tumor (benign)    12 total, 4 have been removed   Cancer    Chest pain in adult 07/31/2017   Family history of brain tumor    Family history of brain tumor 06/27/2021   Gallstones    Headache    Hyperlipidemia with target LDL less than 100 08/18/2015   Internal hemorrhoids 08/18/2014   Lynch syndrome    PONV (postoperative nausea and vomiting)    Past Surgical History:  Procedure Laterality Date   ABDOMINAL HYSTERECTOMY N/A 03/08/2022   Procedure: HYSTERECTOMY ABDOMINAL;  Surgeon: Myna Hidalgo, DO;  Location: [facility];  Service: Gynecology;  Laterality: N/A;   APPLICATION OF CRANIAL NAVIGATION Left 02/18/2021   Procedure: APPLICATION OF CRANIAL NAVIGATION;  Surgeon: Jadene Pierini, MD;  Location: [facility];  Service: Neurosurgery;  Laterality: Left;   APPLICATION OF CRANIAL NAVIGATION N/A 05/18/2021   Procedure: APPLICATION OF CRANIAL NAVIGATION;  Surgeon: Jadene Pierini, MD;  Location: [facility];  Service: Neurosurgery;  Laterality: N/A;  BIOPSY  06/28/2022   Procedure: BIOPSY;  Surgeon: Shellia Cleverly, DO;  Location: [facility];  Service: Gastroenterology;;   BRAIN SURGERY     CHOLECYSTECTOMY  1993   COLONOSCOPY WITH PROPOFOL N/A 06/28/2022   Procedure: COLONOSCOPY WITH PROPOFOL;  Surgeon: Shellia Cleverly, DO;  [facility]: [facility];  Service: Gastroenterology;  Laterality: N/A;   CRANIOTOMY Left 02/18/2021   Procedure: Left orbitozygomatic craniotomy for tumor resection  with Brain Lab;  Surgeon: Jadene Pierini, MD;  Location: [facility] OR;  Service: Neurosurgery;  Laterality:  Left;   CRANIOTOMY Right 05/18/2021   Procedure: Right Orbitozygomatic craniotomy for tumor with brainlab;  Surgeon: Jadene Pierini, MD;  Location[facility] OR;  Service: Neurosurgery;  Laterality: Right;   ESOPHAGOGASTRODUODENOSCOPY (EGD) WITH PROPOFOL N/A 06/28/2022   Procedure: ESOPHAGOGASTRODUODENOSCOPY (EGD) WITH PROPOFOL;  Surgeon: Shellia Cleverly, DO;  [facility];  Service: Gastroenterology;  Laterality: N/A;   POLYPECTOMY  06/28/2022   Procedure: POLYPECTOMY;  Surgeon: Shellia Cleverly, DO;  [facility];  Service: Gastroenterology;;   SALPINGOOPHORECTOMY Bilateral 03/08/2022   Procedure: OPEN SALPINGO OOPHORECTOMY;  Surgeon: Myna Hidalgo, DO;  Location: [facility];  Service: Gynecology;  Laterality: Bilateral;   SHOULDER ARTHROSCOPY Right 11/06/2019   Procedure: ARTHROSCOPY RIGHT SHOULDER, DEBRIDEMENT X TWO STRUCTURES; OPEN ROTATOR CUFF REPAIR;  Surgeon: Vickki Hearing, MD;  Location: [facility];  Service: Orthopedics;  Laterality: Right;   Family History  Problem Relation Age of Onset   Hyperlipidemia Mother    Heart disease Father    Other Sister        meningiomas/tumors x11   Brain cancer Maternal Aunt        d. 60s-60s   Lung cancer Maternal Aunt    Bone cancer Maternal Aunt        d. 50s-60s   Liver cancer Paternal Uncle    Lung cancer Paternal Grandmother        d.85   Other Daughter        Lynch syndrome   Rectal cancer Neg Hx    Esophageal cancer Neg Hx    Stomach cancer Neg Hx    Breast cancer Neg Hx    Colon polyps Neg Hx    Colon cancer Neg Hx    Social History   Socioeconomic History   Marital status: Divorced    Spouse name: Not on file   Number of children: 2   Years of education: Not on file   Highest education level: Associate degree: occupational, Scientist, product/process development, or vocational program  Occupational History   Not on file  Tobacco Use   Smoking status: Never   Smokeless tobacco: Never  Vaping Use   Vaping Use: Never used   Substance and Sexual Activity   Alcohol use: No   Drug use: No   Sexual activity: Not Currently    Birth control/protection: Injection  Other Topics Concern   Not on file  Social History Narrative   Not on file   Social Determinants of Health   Financial Resource Strain: Low Risk  (09/15/2022)   Overall Financial Resource Strain (CARDIA)    Difficulty of Paying Living Expenses: Not hard at all  Food Insecurity: No Food Insecurity (09/15/2022)   Hunger Vital Sign    Worried About Running Out of Food in the Last Year: Never true    Ran Out of Food in the Last Year: Never true  Transportation Needs: No Transportation Needs (09/15/2022)   PRAPARE - Transportation  Lack of Transportation (Medical): No    Lack of Transportation (Non-Medical): No  Physical Activity: Unknown (09/15/2022)   Exercise Vital Sign    Days of Exercise per Week: 0 days    Minutes of Exercise per Session: Not on file  Stress: Not on file  Social Connections: Socially Isolated (09/15/2022)   Social Connection and Isolation Panel [NHANES]    Frequency of Communication with Friends and Family: More than three times a week    Frequency of Social Gatherings with Friends and Family: Once a week    Attends Religious Services: Never    Database administrator or Organizations: No    Attends Engineer, structural: Not on file    Marital Status: Divorced  Catering manager Violence: Not on file   ROS As per HPI  Objective   BP 119/75   Pulse 69   Temp 98.2 F (36.8 C)   Ht  (1.651 m)   Wt 176 lb (79.8 kg)   SpO2 99%   BMI 29.29 kg/m   Physical Exam Constitutional:      General: She is not in acute distress.    Appearance: Normal appearance. She is not ill-appearing, toxic-appearing or diaphoretic.  Eyes:     General: Gaze aligned appropriately.     Extraocular Movements: Extraocular movements intact.     Right eye: Normal extraocular motion and no nystagmus.     Left eye: Normal extraocular  motion and no nystagmus.  Cardiovascular:     Rate and Rhythm: Normal rate.     Pulses: Normal pulses.     Heart sounds: Normal heart sounds. No murmur heard.    No gallop.  Pulmonary:     Effort: Pulmonary effort is normal. No respiratory distress.     Breath sounds: Normal breath sounds. No stridor. No wheezing, rhonchi or rales.  Skin:    General: Skin is warm.     Capillary Refill: Capillary refill takes less than 2 seconds.  Neurological:     General: No focal deficit present.     Mental Status: She is alert and oriented to person, place, and time. Mental status is at baseline.     Motor: No weakness.  Psychiatric:        Mood and Affect: Mood normal.        Behavior: Behavior normal.        Thought Content: Thought content normal.        Judgment: Judgment normal.    Assessment & Plan:  1. Vasovagal syncope 2. Brain tumor 3. Seizure-like activity Labs as below. Will communicate results to patient once available.  She is established with neurosurgery due to history of meningiomas and has communicated results with them. Discussed with patient maintaining hydration. Per ED note at [facility], Orthostatics unremarkable, Gabapentin was decreased, CT did not show any intracranial hemorrhage or midline shift. Per brother report (witness of fall), patient was shaking upon fall. However, EEG was normal in ED. This does not rule out seizure. Patient has follow up with neurosurgery planned. Discussed with patient pursuing cardiac workup given that she has had other syncopal events in her life. She declined zio at this time. Discussed with patient that she can continue taking vitamin D supplement for the time being until we receive results from labs.  - CMP14+EGFR - VITAMIN D 25 Hydroxy (Vit-D Deficiency, Fractures) - CBC with Differential/Platelet  The above assessment and management plan was discussed with the patient. The patient verbalized understanding of  and has agreed to the management  plan using shared-decision making. Patient is aware to call the clinic if they develop any new symptoms or if symptoms fail to improve or worsen. Patient is aware when to return to the clinic for a follow-up visit. Patient educated on when it is appropriate to go to the emergency department.   Neale Burly, DNP-FNP [facility] [address] [place], [place] [address] [phone]

## 2022-10-11 ENCOUNTER — Other Ambulatory Visit: Payer: Self-pay | Admitting: *Deleted

## 2022-10-11 ENCOUNTER — Encounter: Payer: Self-pay | Admitting: Family Medicine

## 2022-10-11 DIAGNOSIS — R7989 Other specified abnormal findings of blood chemistry: Secondary | ICD-10-CM

## 2022-10-11 LAB — CBC WITH DIFFERENTIAL/PLATELET
Basophils Absolute: 0 10*3/uL (ref 0.0–0.2)
Basos: 1 %
EOS (ABSOLUTE): 0.1 10*3/uL (ref 0.0–0.4)
Hematocrit: 44.5 % (ref 34.0–46.6)
Hemoglobin: 14.5 g/dL (ref 11.1–15.9)
Lymphocytes Absolute: 1.4 10*3/uL (ref 0.7–3.1)
Lymphs: 24 %
MCHC: 32.6 g/dL (ref 31.5–35.7)
Neutrophils Absolute: 4 10*3/uL (ref 1.4–7.0)
Platelets: 303 10*3/uL (ref 150–450)
RBC: 4.88 x10E6/uL (ref 3.77–5.28)
RDW: 13.4 % (ref 11.7–15.4)
WBC: 5.9 10*3/uL (ref 3.4–10.8)

## 2022-10-11 LAB — CMP14+EGFR
ALT: 32 IU/L (ref 0–32)
AST: 19 IU/L (ref 0–40)
Albumin/Globulin Ratio: 1.8 (ref 1.2–2.2)
Albumin: 4.4 g/dL (ref 3.9–4.9)
Alkaline Phosphatase: 184 IU/L — ABNORMAL HIGH (ref 44–121)
CO2: 20 mmol/L (ref 20–29)
Chloride: 107 mmol/L — ABNORMAL HIGH (ref 96–106)
Creatinine, Ser: 0.79 mg/dL (ref 0.57–1.00)
Globulin, Total: 2.4 g/dL (ref 1.5–4.5)
Sodium: 142 mmol/L (ref 134–144)
eGFR: 92 mL/min/{1.73_m2} (ref >59)

## 2022-10-11 LAB — VITAMIN D 25 HYDROXY (VIT D DEFICIENCY, FRACTURES): Vit D, 25-Hydroxy: 62.9 ng/mL (ref 30.0–100.0)

## 2022-10-12 ENCOUNTER — Other Ambulatory Visit: Payer: Commercial Managed Care - PPO

## 2022-10-17 ENCOUNTER — Other Ambulatory Visit: Payer: Self-pay | Admitting: Family Medicine

## 2022-10-17 DIAGNOSIS — Z1231 Encounter for screening mammogram for malignant neoplasm of breast: Secondary | ICD-10-CM

## 2022-10-18 ENCOUNTER — Telehealth: Payer: Self-pay

## 2022-10-18 NOTE — Telephone Encounter (Signed)
Uploaded CPT [id number] and [id number] Case#[id number] eff 6/3-08/31/2024. Notified patient via Mychart

## 2022-10-19 LAB — INTACT PTH (INCLUDES CALCIUM)
Calcium, Serum: 10.9 mg/dL — ABNORMAL HIGH
PTH (Intact Assay): 122 pg/mL — ABNORMAL HIGH

## 2022-10-20 ENCOUNTER — Encounter: Payer: Self-pay | Admitting: Family Medicine

## 2022-10-20 NOTE — Addendum Note (Signed)
Addended by: Neale Burly on: 10/20/2022 09:00 AM   Modules accepted: Orders

## 2022-10-25 ENCOUNTER — Other Ambulatory Visit: Payer: Commercial Managed Care - PPO

## 2022-10-25 ENCOUNTER — Encounter (INDEPENDENT_AMBULATORY_CARE_PROVIDER_SITE_OTHER): Payer: Commercial Managed Care - PPO | Admitting: Family Medicine

## 2022-10-25 DIAGNOSIS — R310 Gross hematuria: Secondary | ICD-10-CM

## 2022-10-25 DIAGNOSIS — R3989 Other symptoms and signs involving the genitourinary system: Secondary | ICD-10-CM

## 2022-10-25 LAB — URINALYSIS, ROUTINE W REFLEX MICROSCOPIC
Bilirubin, UA: NEGATIVE
Glucose, UA: NEGATIVE
Ketones, UA: NEGATIVE
Nitrite, UA: NEGATIVE
Protein,UA: NEGATIVE
Specific Gravity, UA: <1.005 — ABNORMAL LOW (ref 1.005–1.030)
Urobilinogen, Ur: 0.2 mg/dL (ref 0.2–1.0)
pH, UA: 6 (ref 5.0–7.5)

## 2022-10-25 LAB — MICROSCOPIC EXAMINATION
Bacteria, UA: NONE SEEN
Epithelial Cells (non renal): NONE SEEN /hpf (ref 0–10)
Renal Epithel, UA: NONE SEEN /hpf

## 2022-10-25 MED ORDER — PHENAZOPYRIDINE HCL 200 MG PO TABS
200.0000 mg | ORAL_TABLET | Freq: Three times a day (TID) | ORAL | 0 refills | Status: AC | PRN
Start: 2022-10-25 — End: 2022-10-27

## 2022-10-25 MED ORDER — NITROFURANTOIN MONOHYD MACRO 100 MG PO CAPS
100.0000 mg | ORAL_CAPSULE | Freq: Two times a day (BID) | ORAL | 0 refills | Status: AC
Start: 2022-10-25 — End: 2022-10-30

## 2022-10-25 NOTE — Telephone Encounter (Signed)
Please see the MyChart message reply(ies) for my assessment and plan.    This patient gave consent for this Medical Advice Message and is aware that it may result in a bill to their insurance company, as well as the possibility of receiving a bill for a co-payment or deductible. They are an established patient, but are not seeking medical advice exclusively about a problem treated during an in person or video visit in the last seven days. I did not recommend an in person or video visit within seven days of my reply.   I spent a total of 9 minutes cumulative time within 7 days through MyChart messaging.  Jazlyn Tippens, DO   

## 2022-10-25 NOTE — Addendum Note (Signed)
Addended by: Raliegh Ip on: 10/25/2022 12:20 PM   Modules accepted: Orders

## 2022-10-27 LAB — URINE CULTURE: Organism ID, Bacteria: NO GROWTH

## 2022-10-30 ENCOUNTER — Ambulatory Visit
Admission: RE | Admit: 2022-10-30 | Discharge: 2022-10-30 | Disposition: A | Discharge status: Home / Self Care | Payer: Commercial Managed Care - PPO | Source: Ambulatory Visit | Attending: Family Medicine | Admitting: Family Medicine

## 2022-10-30 DIAGNOSIS — Z1231 Encounter for screening mammogram for malignant neoplasm of breast: Secondary | ICD-10-CM

## 2022-10-31 ENCOUNTER — Telehealth: Payer: Self-pay

## 2022-10-31 ENCOUNTER — Encounter: Payer: Self-pay | Admitting: *Deleted

## 2022-10-31 NOTE — Telephone Encounter (Signed)
Surgery approved with effective date 6/1. Sent message via mychart to patient. Gave file to [facility] to schedule.

## 2022-11-02 NOTE — Progress Notes (Signed)
Patient ID: Laurie Kelley, female    DOB: 1971-03-17, 52 y.o.   MRN: [id number]  Chief Complaint  Patient presents with   Pre-op Exam      ICD-10-CM   1. Status post craniotomy  Z98.890        History of Present Illness: Laurie Kelley is a 52 y.o.  female  with a history of multiple tumors s/p craniotomies in 2022.  She presents for preoperative evaluation for upcoming procedure, fat grafting to the forehead and temple region, scheduled for 11/22/2022 with Dr. Ulice Bold.  The patient has had problems with PONV and aspiration after anesthesia.  She reports a history of unique allergy to saline.  Informed patient that we will use LR instead, but advised that she notify anesthesia team.  She denies smoking or use nicotine-containing products.  She recently had a syncopal episode, but reports that EEG was negative as was her cardiac monitoring and workup.  She reports a history of lower leg thromboses after her neurosurgery in 2022, but that it was superficial rather than DVT.  Reviewed report from 02/2021, negative for DVT, but muscular vein thrombosis noted?  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  She reports that she was never treated with anticoagulants.  Denies any other personal or family history of DVT.  Denies personal history of cancer or severe cardiac/pulmonary disease.  No varicosities or leg swelling.  Discussed abdominal fat harvesting, whether it be through traditional liposuction versus a dermal fat graft, and then grafting to the forehead and temple regions.  She states that her right side is more bothersome than her left.  She is hoping that each side can be done, but understands that oftentimes these procedures are done in stages.  Summary of Previous Visit: She was seen by Dr. Ulice Bold most recently on 09/13/2022.  At that time, she complained of volume deficits in  her forehead and temporal regions due to tumor resection.  Attempted to treat with Restylane, but will proceed with fat grafting given the lack of prominence with filler as well as the high volume required.  PMH Significant for: Multiple brain tumors, right-sided and left-sided craniotomies for tumor resection, chronic facial pain, Lynch syndrome, hyperlipidemia.  She is followed by Dr. Kelby Aline with [facility] as well as her primary care provider, Dr. Nadine Counts.     Past Medical History: Allergies: Allergies  Allergen Reactions   Gadolinium Derivatives Other (See Comments)    Pt reports she developed chest tightness, burning of her back, and weakness after MRI contrast 11/2021. Per Dr. Alfredo Batty, he suggests 13 hr protocol prior to MRI.    Saline Nausea And Vomiting   Sodium Chloride Nausea And Vomiting    Current Medications:  Current Outpatient Medications:    atorvastatin (LIPITOR) 20 MG tablet, Take 1 tablet (20 mg total) by mouth daily., Disp: 90 tablet, Rfl: 3   BIOTIN PO, Take 1 tablet by mouth daily., Disp: , Rfl:    Cholecalciferol (DIALYVITE VITAMIN D 5000) 125 MCG (5000 UT) capsule, Take 5,000 Units by mouth daily., Disp: , Rfl:    gabapentin (NEURONTIN) 600 MG tablet, Take 600 mg by mouth 3 (three) times daily., Disp: , Rfl:    hydrocortisone 1 % ointment, Apply 1 Application topically 2 (two) times daily as needed for itching., Disp: , Rfl:    loratadine-pseudoephedrine (CLARITIN-D 24-HOUR) 10-240 MG 24 hr tablet, Take 1  tablet by mouth as needed. (Patient not taking: Reported on 11/03/2022), Disp: , Rfl:   Past Medical Problems: 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   Cancer (HCC)    Chest pain in adult 07/31/2017   Family history of brain tumor    Family history of brain tumor 06/27/2021   Gallstones    Headache    Hyperlipidemia with target LDL less  than 100 08/18/2015   Internal hemorrhoids 08/18/2014   Lynch syndrome    PONV (postoperative nausea and vomiting)     Past Surgical History: Past Surgical History:  Procedure Laterality Date   ABDOMINAL HYSTERECTOMY N/A 03/08/2022   Procedure: HYSTERECTOMY ABDOMINAL;  Surgeon: Myna Hidalgo, DO;  Location: [facility];  Service: Gynecology;  Laterality: N/A;   APPLICATION OF CRANIAL NAVIGATION Left 02/18/2021   Procedure: APPLICATION OF CRANIAL NAVIGATION;  Surgeon: Jadene Pierini, MD;  Location: [facility];  Service: Neurosurgery;  Laterality: Left;   APPLICATION OF CRANIAL NAVIGATION N/A 05/18/2021   Procedure: APPLICATION OF CRANIAL NAVIGATION;  Surgeon: Jadene Pierini, MD;  Location: [facility];  Service: Neurosurgery;  Laterality: N/A;   BIOPSY  06/28/2022   Procedure: BIOPSY;  Surgeon: Shellia Cleverly, DO;  [facility];  Service: Gastroenterology;;   BRAIN SURGERY     CHOLECYSTECTOMY  1993   COLONOSCOPY WITH PROPOFOL N/A 06/28/2022   Procedure: COLONOSCOPY WITH PROPOFOL;  Surgeon: Shellia Cleverly, DO;  [facility]: [facility];  Service: Gastroenterology;  Laterality: N/A;   CRANIOTOMY Left 02/18/2021   Procedure: Left orbitozygomatic craniotomy for tumor resection  with Brain Lab;  Surgeon: Jadene Pierini, MD;  Location: [facility] OR;  Service: Neurosurgery;  Laterality: Left;   CRANIOTOMY Right 05/18/2021   Procedure: Right Orbitozygomatic craniotomy for tumor with brainlab;  Surgeon: Jadene Pierini, MD;  Location: [facility] OR;  Service: Neurosurgery;  Laterality: Right;   ESOPHAGOGASTRODUODENOSCOPY (EGD) WITH PROPOFOL N/A 06/28/2022   Procedure: ESOPHAGOGASTRODUODENOSCOPY (EGD) WITH PROPOFOL;  Surgeon: Shellia Cleverly, DO;  [facility];  Service: Gastroenterology;  Laterality: N/A;   POLYPECTOMY  06/28/2022   Procedure: POLYPECTOMY;  Surgeon: Shellia Cleverly, DO;  [facility];  Service: Gastroenterology;;   SALPINGOOPHORECTOMY Bilateral 03/08/2022    Procedure: OPEN SALPINGO OOPHORECTOMY;  Surgeon: Myna Hidalgo, DO;  Location: [facility];  Service: Gynecology;  Laterality: Bilateral;   SHOULDER ARTHROSCOPY Right 11/06/2019   Procedure: ARTHROSCOPY RIGHT SHOULDER, DEBRIDEMENT X TWO STRUCTURES; OPEN ROTATOR CUFF REPAIR;  Surgeon: Vickki Hearing, MD;  Location: [facility];  Service: Orthopedics;  Laterality: Right;    Social History: Social History   Socioeconomic History   Marital status: Divorced    Spouse name: Not on file   Number of children: 2   Years of education: Not on file   Highest education level: Associate degree: occupational, Scientist, product/process development, or vocational program  Occupational History   Not on file  Tobacco Use   Smoking status: Never   Smokeless tobacco: Never  Vaping Use   Vaping Use: Never used  Substance and Sexual Activity   Alcohol use: No   Drug use: No   Sexual activity: Not Currently    Birth control/protection: Injection  Other Topics Concern   Not on file  Social History Narrative   Not on file   Social Determinants of Health   Financial Resource Strain: Low Risk  (09/15/2022)   Overall  Financial Resource Strain (CARDIA)    Difficulty of Paying Living Expenses: Not hard at all  Food Insecurity: No Food Insecurity (09/15/2022)   Hunger Vital Sign    Worried About Running Out of Food in the Last Year: Never true    Ran Out of Food in the Last Year: Never true  Transportation Needs: No Transportation Needs (09/15/2022)   PRAPARE - Administrator, Civil Service (Medical): No    Lack of Transportation (Non-Medical): No  Physical Activity: Unknown (09/15/2022)   Exercise Vital Sign    Days of Exercise per Week: 0 days    Minutes of Exercise per Session: Not on file  Stress: Not on file  Social Connections: Socially Isolated (09/15/2022)   Social Connection and Isolation Panel [NHANES]    Frequency of Communication with Friends and Family: More than three times a week    Frequency of Social  Gatherings with Friends and Family: Once a week    Attends Religious Services: Never    Database administrator or Organizations: No    Attends Engineer, structural: Not on file    Marital Status: Divorced  Catering manager Violence: Not on file    Family History: Family History  Problem Relation Age of Onset   Hyperlipidemia Mother    Heart disease Father    Other Sister        meningiomas/tumors x11   Brain cancer Maternal Aunt        d. 68s-60s   Lung cancer Maternal Aunt    Bone cancer Maternal Aunt        d. 50s-60s   Liver cancer Paternal Uncle    Lung cancer Paternal Grandmother        d.85   Other Daughter        Lynch syndrome   Rectal cancer Neg Hx    Esophageal cancer Neg Hx    Stomach cancer Neg Hx    Breast cancer Neg Hx    Colon polyps Neg Hx    Colon cancer Neg Hx     Review of Systems: ROS Denies any recent chest pain, difficulty breathing, leg swelling, or fevers.  Physical Exam: Vital Signs BP 117/76 (BP Location: Right Arm, Patient Position: Sitting, Cuff Size: Small)   Pulse 81   Resp 16   Ht 5\' 5"  (1.651 m)   SpO2 98%   BMI 29.29 kg/m   Physical Exam Constitutional:      General: Not in acute distress.    Appearance: Normal appearance. Not ill-appearing.  HENT:     Head: Normocephalic and atraumatic.  Eyes:     Pupils: Pupils are equal, round. Cardiovascular:     Rate and Rhythm: Normal rate.    Pulses: Normal pulses.  Pulmonary:     Effort: No respiratory distress or increased work of breathing.  Speaks in full sentences. Abdominal:     General: Abdomen is flat. No distension.   Musculoskeletal: Normal range of motion. No lower extremity swelling or edema. No varicosities.  Skin:    General: Skin is warm and dry.     Findings: No erythema or rash.  Neurological:     Mental Status: Alert and oriented to person, place, and time.  Psychiatric:        Mood and Affect: Mood normal.        Behavior: Behavior normal.     Assessment/Plan: The patient is scheduled for fat grafting to the forehead and temple region with  Dr. Ulice Bold.  Risks, benefits, and alternatives of procedure discussed, questions answered and consent obtained.    Smoking Status: Non-smoker.  Caprini Score: 7; Risk Factors include: Age, BMI greater than 25, personal history of lower extremity acute muscle vein thrombosis, and length of planned surgery. Recommendation for mechanical and possibly pharmacological prophylaxis.  Will discuss with surgeon and prescribe Lovenox if indicated.  However, her score would only be 4 if her lower leg thrombosis was excluded from the criteria.  Her imaging was negative for DVT.  Encourage early ambulation.   Pictures obtained: 09/13/2022  Post-op Rx sent to pharmacy: Oxycodone, Zofran, and Keflex.  She states that she has taken oxycodone after her hysterectomy and it was well-tolerated with Benadryl.  Patient was provided with the General Surgical Risk consent document and Pain Medication Agreement prior to their appointment.  They had adequate time to read through the risk consent documents and Pain Medication Agreement. We also discussed them in person together during this preop appointment. All of their questions were answered to their satisfaction.  Recommended calling if they have any further questions.  Risk consent form and Pain Medication Agreement to be scanned into patient's chart.    Electronically signed by: Evelena Leyden, PA-C 11/03/2022 11:19 AM

## 2022-11-02 NOTE — H&P (View-Only) (Signed)
   Patient ID: Laurie Kelley, female    DOB: 01/27/1971, 51 y.o.   MRN: [id number]  Chief Complaint  Patient presents with   Pre-op Exam      ICD-10-CM   1. Status post craniotomy  Z98.890        History of Present Illness: Laurie Kelley is a 51 y.o.  female  with a history of multiple tumors s/p craniotomies in 2022.  She presents for preoperative evaluation for upcoming procedure, fat grafting to the forehead and temple region, scheduled for 11/22/2022 with Dr. Dillingham.  The patient has had problems with PONV and aspiration after anesthesia.  She reports a history of unique allergy to saline.  Informed patient that we will use LR instead, but advised that she notify anesthesia team.  She denies smoking or use nicotine-containing products.  She recently had a syncopal episode, but reports that EEG was negative as was her cardiac monitoring and workup.  She reports a history of lower leg thromboses after her neurosurgery in 2022, but that it was superficial rather than DVT.  Reviewed report from 02/2021, negative for DVT, but muscular vein thrombosis noted?  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  She reports that she was never treated with anticoagulants.  Denies any other personal or family history of DVT.  Denies personal history of cancer or severe cardiac/pulmonary disease.  No varicosities or leg swelling.  Discussed abdominal fat harvesting, whether it be through traditional liposuction versus a dermal fat graft, and then grafting to the forehead and temple regions.  She states that her right side is more bothersome than her left.  She is hoping that each side can be done, but understands that oftentimes these procedures are done in stages.  Summary of Previous Visit: She was seen by Dr. Dillingham most recently on 09/13/2022.  At that time, she complained of volume deficits in  her forehead and temporal regions due to tumor resection.  Attempted to treat with Restylane, but will proceed with fat grafting given the lack of prominence with filler as well as the high volume required.  PMH Significant for: Multiple brain tumors, right-sided and left-sided craniotomies for tumor resection, chronic facial pain, Lynch syndrome, hyperlipidemia.  She is followed by Dr. Janjua with [facility] as well as her primary care provider, Dr. Gottschalk.     Past Medical History: Allergies: Allergies  Allergen Reactions   Gadolinium Derivatives Other (See Comments)    Pt reports she developed chest tightness, burning of her back, and weakness after MRI contrast 11/2021. Per Dr. Mattern, he suggests 13 hr protocol prior to MRI.    Saline Nausea And Vomiting   Sodium Chloride Nausea And Vomiting    Current Medications:  Current Outpatient Medications:    atorvastatin (LIPITOR) 20 MG tablet, Take 1 tablet (20 mg total) by mouth daily., Disp: 90 tablet, Rfl: 3   BIOTIN PO, Take 1 tablet by mouth daily., Disp: , Rfl:    Cholecalciferol (DIALYVITE VITAMIN D 5000) 125 MCG (5000 UT) capsule, Take 5,000 Units by mouth daily., Disp: , Rfl:    gabapentin (NEURONTIN) 600 MG tablet, Take 600 mg by mouth 3 (three) times daily., Disp: , Rfl:    hydrocortisone 1 % ointment, Apply 1 Application topically 2 (two) times daily as needed for itching., Disp: , Rfl:    loratadine-pseudoephedrine (CLARITIN-D 24-HOUR) 10-240 MG 24 hr tablet, Take 1   tablet by mouth as needed. (Patient not taking: Reported on 11/03/2022), Disp: , Rfl:   Past Medical Problems: 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   Cancer (HCC)    Chest pain in adult 07/31/2017   Family history of brain tumor    Family history of brain tumor 06/27/2021   Gallstones    Headache    Hyperlipidemia with target LDL less  than 100 08/18/2015   Internal hemorrhoids 08/18/2014   Lynch syndrome    PONV (postoperative nausea and vomiting)     Past Surgical History: Past Surgical History:  Procedure Laterality Date   ABDOMINAL HYSTERECTOMY N/A 03/08/2022   Procedure: HYSTERECTOMY ABDOMINAL;  Surgeon: Ozan, Jennifer, DO;  Location: [facility];  Service: Gynecology;  Laterality: N/A;   APPLICATION OF CRANIAL NAVIGATION Left 02/18/2021   Procedure: APPLICATION OF CRANIAL NAVIGATION;  Surgeon: Ostergard, Thomas A, MD;  Location: [facility];  Service: Neurosurgery;  Laterality: Left;   APPLICATION OF CRANIAL NAVIGATION N/A 05/18/2021   Procedure: APPLICATION OF CRANIAL NAVIGATION;  Surgeon: Ostergard, Thomas A, MD;  Location: [facility];  Service: Neurosurgery;  Laterality: N/A;   BIOPSY  06/28/2022   Procedure: BIOPSY;  Surgeon: Cirigliano, Vito V, DO;  Location: [facility];  Service: Gastroenterology;;   BRAIN SURGERY     CHOLECYSTECTOMY  1993   COLONOSCOPY WITH PROPOFOL N/A 06/28/2022   Procedure: COLONOSCOPY WITH PROPOFOL;  Surgeon: Cirigliano, Vito V, DO;  Location: [facility];  Service: Gastroenterology;  Laterality: N/A;   CRANIOTOMY Left 02/18/2021   Procedure: Left orbitozygomatic craniotomy for tumor resection  with Brain Lab;  Surgeon: Ostergard, Thomas A, MD;  Location: [facility];  Service: Neurosurgery;  Laterality: Left;   CRANIOTOMY Right 05/18/2021   Procedure: Right Orbitozygomatic craniotomy for tumor with brainlab;  Surgeon: Ostergard, Thomas A, MD;  Location: [facility];  Service: Neurosurgery;  Laterality: Right;   ESOPHAGOGASTRODUODENOSCOPY (EGD) WITH PROPOFOL N/A 06/28/2022   Procedure: ESOPHAGOGASTRODUODENOSCOPY (EGD) WITH PROPOFOL;  Surgeon: Cirigliano, Vito V, DO;  Location: [facility];  Service: Gastroenterology;  Laterality: N/A;   POLYPECTOMY  06/28/2022   Procedure: POLYPECTOMY;  Surgeon: Cirigliano, Vito V, DO;  Location: [facility];  Service: Gastroenterology;;   SALPINGOOPHORECTOMY Bilateral 03/08/2022    Procedure: OPEN SALPINGO OOPHORECTOMY;  Surgeon: Ozan, Jennifer, DO;  Location: [facility];  Service: Gynecology;  Laterality: Bilateral;   SHOULDER ARTHROSCOPY Right 11/06/2019   Procedure: ARTHROSCOPY RIGHT SHOULDER, DEBRIDEMENT X TWO STRUCTURES; OPEN ROTATOR CUFF REPAIR;  Surgeon: Harrison, Stanley E, MD;  Location: [facility];  Service: Orthopedics;  Laterality: Right;    Social History: Social History   Socioeconomic History   Marital status: Divorced    Spouse name: Not on file   Number of children: 2   Years of education: Not on file   Highest education level: Associate degree: occupational, technical, or vocational program  Occupational History   Not on file  Tobacco Use   Smoking status: Never   Smokeless tobacco: Never  Vaping Use   Vaping Use: Never used  Substance and Sexual Activity   Alcohol use: No   Drug use: No   Sexual activity: Not Currently    Birth control/protection: Injection  Other Topics Concern   Not on file  Social History Narrative   Not on file   Social Determinants of Health   Financial Resource Strain: Low Risk  (09/15/2022)   Overall   Financial Resource Strain (CARDIA)    Difficulty of Paying Living Expenses: Not hard at all  Food Insecurity: No Food Insecurity (09/15/2022)   Hunger Vital Sign    Worried About Running Out of Food in the Last Year: Never true    Ran Out of Food in the Last Year: Never true  Transportation Needs: No Transportation Needs (09/15/2022)   PRAPARE - Transportation    Lack of Transportation (Medical): No    Lack of Transportation (Non-Medical): No  Physical Activity: Unknown (09/15/2022)   Exercise Vital Sign    Days of Exercise per Week: 0 days    Minutes of Exercise per Session: Not on file  Stress: Not on file  Social Connections: Socially Isolated (09/15/2022)   Social Connection and Isolation Panel [NHANES]    Frequency of Communication with Friends and Family: More than three times a week    Frequency of Social  Gatherings with Friends and Family: Once a week    Attends Religious Services: Never    Active Member of Clubs or Organizations: No    Attends Club or Organization Meetings: Not on file    Marital Status: Divorced  Intimate Partner Violence: Not on file    Family History: Family History  Problem Relation Age of Onset   Hyperlipidemia Mother    Heart disease Father    Other Sister        meningiomas/tumors x11   Brain cancer Maternal Aunt        d. 50s-60s   Lung cancer Maternal Aunt    Bone cancer Maternal Aunt        d. 50s-60s   Liver cancer Paternal Uncle    Lung cancer Paternal Grandmother        d.85   Other Daughter        Lynch syndrome   Rectal cancer Neg Hx    Esophageal cancer Neg Hx    Stomach cancer Neg Hx    Breast cancer Neg Hx    Colon polyps Neg Hx    Colon cancer Neg Hx     Review of Systems: ROS Denies any recent chest pain, difficulty breathing, leg swelling, or fevers.  Physical Exam: Vital Signs BP 117/76 (BP Location: Right Arm, Patient Position: Sitting, Cuff Size: Small)   Pulse 81   Resp 16   Ht 5' 5" (1.651 m)   SpO2 98%   BMI 29.29 kg/m   Physical Exam Constitutional:      General: Not in acute distress.    Appearance: Normal appearance. Not ill-appearing.  HENT:     Head: Normocephalic and atraumatic.  Eyes:     Pupils: Pupils are equal, round. Cardiovascular:     Rate and Rhythm: Normal rate.    Pulses: Normal pulses.  Pulmonary:     Effort: No respiratory distress or increased work of breathing.  Speaks in full sentences. Abdominal:     General: Abdomen is flat. No distension.   Musculoskeletal: Normal range of motion. No lower extremity swelling or edema. No varicosities.  Skin:    General: Skin is warm and dry.     Findings: No erythema or rash.  Neurological:     Mental Status: Alert and oriented to person, place, and time.  Psychiatric:        Mood and Affect: Mood normal.        Behavior: Behavior normal.     Assessment/Plan: The patient is scheduled for fat grafting to the forehead and temple region with   Dr. Dillingham.  Risks, benefits, and alternatives of procedure discussed, questions answered and consent obtained.    Smoking Status: Non-smoker.  Caprini Score: 7; Risk Factors include: Age, BMI greater than 25, personal history of lower extremity acute muscle vein thrombosis, and length of planned surgery. Recommendation for mechanical and possibly pharmacological prophylaxis.  Will discuss with surgeon and prescribe Lovenox if indicated.  However, her score would only be 4 if her lower leg thrombosis was excluded from the criteria.  Her imaging was negative for DVT.  Encourage early ambulation.   Pictures obtained: 09/13/2022  Post-op Rx sent to pharmacy: Oxycodone, Zofran, and Keflex.  She states that she has taken oxycodone after her hysterectomy and it was well-tolerated with Benadryl.  Patient was provided with the General Surgical Risk consent document and Pain Medication Agreement prior to their appointment.  They had adequate time to read through the risk consent documents and Pain Medication Agreement. We also discussed them in person together during this preop appointment. All of their questions were answered to their satisfaction.  Recommended calling if they have any further questions.  Risk consent form and Pain Medication Agreement to be scanned into patient's chart.    Electronically signed by: Travonna Swindle, PA-C 11/03/2022 11:19 AM 

## 2022-11-03 ENCOUNTER — Ambulatory Visit (INDEPENDENT_AMBULATORY_CARE_PROVIDER_SITE_OTHER): Payer: Commercial Managed Care - PPO | Admitting: Physician Assistant

## 2022-11-03 VITALS — BP 117/76 | HR 81 | Resp 16 | Ht 65.0 in

## 2022-11-03 DIAGNOSIS — Z9889 Other specified postprocedural states: Secondary | ICD-10-CM

## 2022-11-03 MED ORDER — ONDANSETRON 4 MG PO TBDP: 4.0000 mg | ORAL_TABLET | Freq: Three times a day (TID) | ORAL | 0 refills | Status: DC | PRN

## 2022-11-03 MED ORDER — OXYCODONE HCL 5 MG PO TABS: 5.0000 mg | ORAL_TABLET | Freq: Four times a day (QID) | ORAL | 0 refills | Status: AC | PRN

## 2022-11-03 MED ORDER — CEPHALEXIN 500 MG PO CAPS: 500.0000 mg | ORAL_CAPSULE | Freq: Four times a day (QID) | ORAL | 0 refills | Status: AC

## 2022-11-14 ENCOUNTER — Encounter (HOSPITAL_BASED_OUTPATIENT_CLINIC_OR_DEPARTMENT_OTHER): Payer: Self-pay | Admitting: Plastic Surgery

## 2022-11-14 ENCOUNTER — Telehealth: Payer: Self-pay

## 2022-11-14 ENCOUNTER — Other Ambulatory Visit: Payer: Self-pay

## 2022-11-14 NOTE — Telephone Encounter (Addendum)
Faxed surgical clearance to Dr. Prescott Parma, MD due to patient had syncope episode after 2 days from her neurologist visit.  11/17/2022 Received  signed sx clearance from Dr. Kelby Aline cleared from recent syncope

## 2022-11-14 NOTE — Progress Notes (Signed)
   11/14/22 1302  PAT Phone Screen  Is the patient taking a GLP-1 receptor agonist? No  Do You Have Diabetes? No  Do You Have Hypertension? No  Have You Ever Been to the ER for Asthma? No  Have You Taken Oral Steroids in the Past 3 Months? No  Do you Take Phenteramine or any Other Diet Drugs? No  Recent  Lab Work, EKG, CXR? Yes  Where was this test performed? ED 10/07/22 for syncope Normal EEG, EKG and labwork.  Do you have a history of heart problems? No  Any Recent Hospitalizations? No  Height 5\' 5"  (1.651 m)  Weight 79 kg  Pat Appointment Scheduled No  Reason for No Appointment Not Needed   P't pmh and hx of ponv/ aspiration reviewed with Dr Mal Amabile. Pt may have surgery at [facility] but will need clearance from Neuro since last OV occurred prior to her ED visit on 4/20. LVM with Heather at Dr Kittie Plater office requesting clearance.

## 2022-11-22 ENCOUNTER — Encounter (HOSPITAL_BASED_OUTPATIENT_CLINIC_OR_DEPARTMENT_OTHER): Payer: Self-pay | Admitting: Plastic Surgery

## 2022-11-22 ENCOUNTER — Encounter (HOSPITAL_BASED_OUTPATIENT_CLINIC_OR_DEPARTMENT_OTHER)
Admission: RE | Disposition: A | Discharge status: Home / Self Care | Payer: Self-pay | Source: Home / Self Care | Attending: Plastic Surgery

## 2022-11-22 ENCOUNTER — Ambulatory Visit (HOSPITAL_BASED_OUTPATIENT_CLINIC_OR_DEPARTMENT_OTHER)
Admission: RE | Admit: 2022-11-22 | Discharge: 2022-11-22 | Disposition: A | Discharge status: Home / Self Care | Payer: Commercial Managed Care - PPO | Attending: Plastic Surgery | Admitting: Plastic Surgery

## 2022-11-22 ENCOUNTER — Ambulatory Visit (HOSPITAL_BASED_OUTPATIENT_CLINIC_OR_DEPARTMENT_OTHER): Payer: Commercial Managed Care - PPO | Admitting: Anesthesiology

## 2022-11-22 ENCOUNTER — Other Ambulatory Visit: Payer: Self-pay

## 2022-11-22 DIAGNOSIS — E785 Hyperlipidemia, unspecified: Secondary | ICD-10-CM | POA: Insufficient documentation

## 2022-11-22 DIAGNOSIS — Z90710 Acquired absence of both cervix and uterus: Secondary | ICD-10-CM | POA: Diagnosis not present

## 2022-11-22 DIAGNOSIS — M952 Other acquired deformity of head: Secondary | ICD-10-CM | POA: Diagnosis present

## 2022-11-22 DIAGNOSIS — Z9889 Other specified postprocedural states: Secondary | ICD-10-CM | POA: Diagnosis not present

## 2022-11-22 DIAGNOSIS — M6258 Muscle wasting and atrophy, not elsewhere classified, other site: Secondary | ICD-10-CM | POA: Diagnosis not present

## 2022-11-22 HISTORY — PX: ADJACENT TISSUE TRANSFER/TISSUE REARRANGEMENT: SHX6829

## 2022-11-22 SURGERY — ADJACENT TISSUE TRANSFER
Anesthesia: General | Site: Head | Laterality: Right

## 2022-11-22 MED ORDER — ONDANSETRON HCL 4 MG/2ML IJ SOLN
INTRAMUSCULAR | Status: DC | PRN
  Administered 2022-11-22: 4 mg via INTRAVENOUS

## 2022-11-22 MED ORDER — ACETAMINOPHEN 325 MG RE SUPP: 650.0000 mg | RECTAL | Status: DC | PRN

## 2022-11-22 MED ORDER — ACETAMINOPHEN 500 MG PO TABS
1000.0000 mg | ORAL_TABLET | Freq: Once | ORAL | Status: AC
  Administered 2022-11-22: 1000 mg via ORAL

## 2022-11-22 MED ORDER — FENTANYL CITRATE (PF) 100 MCG/2ML IJ SOLN
INTRAMUSCULAR | Status: DC | PRN
  Administered 2022-11-22: 100 ug via INTRAVENOUS

## 2022-11-22 MED ORDER — OXYCODONE HCL 5 MG PO TABS
5.0000 mg | ORAL_TABLET | Freq: Once | ORAL | Status: AC | PRN
  Administered 2022-11-22: 5 mg via ORAL

## 2022-11-22 MED ORDER — PROMETHAZINE HCL 25 MG/ML IJ SOLN: 6.2500 mg | INTRAMUSCULAR | Status: DC | PRN

## 2022-11-22 MED ORDER — ONDANSETRON HCL 4 MG/2ML IJ SOLN
INTRAMUSCULAR | Status: AC
  Filled 2022-11-22: qty 2

## 2022-11-22 MED ORDER — CEFAZOLIN SODIUM-DEXTROSE 2-4 GM/100ML-% IV SOLN
INTRAVENOUS | Status: AC
  Filled 2022-11-22: qty 100

## 2022-11-22 MED ORDER — KETOROLAC TROMETHAMINE 30 MG/ML IJ SOLN
INTRAMUSCULAR | Status: AC
  Filled 2022-11-22: qty 1

## 2022-11-22 MED ORDER — LIDOCAINE HCL (PF) 1 % IJ SOLN
INTRAMUSCULAR | Status: AC
  Filled 2022-11-22: qty 30

## 2022-11-22 MED ORDER — CEFAZOLIN SODIUM-DEXTROSE 2-4 GM/100ML-% IV SOLN
2.0000 g | INTRAVENOUS | Status: AC
  Administered 2022-11-22: 2 g via INTRAVENOUS

## 2022-11-22 MED ORDER — OXYCODONE HCL 5 MG PO TABS: 5.0000 mg | ORAL_TABLET | ORAL | Status: DC | PRN

## 2022-11-22 MED ORDER — ACETAMINOPHEN 500 MG PO TABS
ORAL_TABLET | ORAL | Status: AC
  Filled 2022-11-22: qty 2

## 2022-11-22 MED ORDER — KETOROLAC TROMETHAMINE 30 MG/ML IJ SOLN
30.0000 mg | Freq: Once | INTRAMUSCULAR | Status: AC | PRN
  Administered 2022-11-22: 30 mg via INTRAVENOUS

## 2022-11-22 MED ORDER — PROPOFOL 10 MG/ML IV BOLUS
INTRAVENOUS | Status: AC
  Filled 2022-11-22: qty 20

## 2022-11-22 MED ORDER — LIDOCAINE HCL 1 % IJ SOLN
INTRAMUSCULAR | Status: DC | PRN
  Administered 2022-11-22: 30 mL

## 2022-11-22 MED ORDER — FENTANYL CITRATE (PF) 100 MCG/2ML IJ SOLN: 25.0000 ug | INTRAMUSCULAR | Status: DC | PRN

## 2022-11-22 MED ORDER — PROPOFOL 500 MG/50ML IV EMUL
INTRAVENOUS | Status: AC
  Filled 2022-11-22: qty 50

## 2022-11-22 MED ORDER — MIDAZOLAM HCL 5 MG/5ML IJ SOLN
INTRAMUSCULAR | Status: DC | PRN
  Administered 2022-11-22: 2 mg via INTRAVENOUS

## 2022-11-22 MED ORDER — MIDAZOLAM HCL 2 MG/2ML IJ SOLN
INTRAMUSCULAR | Status: AC
  Filled 2022-11-22: qty 2

## 2022-11-22 MED ORDER — ACETAMINOPHEN 325 MG PO TABS: 650.0000 mg | ORAL_TABLET | ORAL | Status: DC | PRN

## 2022-11-22 MED ORDER — LIDOCAINE 2% (20 MG/ML) 5 ML SYRINGE
INTRAMUSCULAR | Status: DC | PRN
  Administered 2022-11-22: 60 mg via INTRAVENOUS

## 2022-11-22 MED ORDER — LACTATED RINGERS IV SOLN: INTRAVENOUS | Status: DC | PRN

## 2022-11-22 MED ORDER — OXYCODONE HCL 5 MG/5ML PO SOLN: 5.0000 mg | Freq: Once | ORAL | Status: AC | PRN

## 2022-11-22 MED ORDER — ROCURONIUM BROMIDE 10 MG/ML (PF) SYRINGE
PREFILLED_SYRINGE | INTRAVENOUS | Status: AC
  Filled 2022-11-22: qty 10

## 2022-11-22 MED ORDER — AMISULPRIDE (ANTIEMETIC) 5 MG/2ML IV SOLN: 10.0000 mg | Freq: Once | INTRAVENOUS | Status: DC | PRN

## 2022-11-22 MED ORDER — PROPOFOL 10 MG/ML IV BOLUS
INTRAVENOUS | Status: DC | PRN
  Administered 2022-11-22: 150 mg via INTRAVENOUS

## 2022-11-22 MED ORDER — LIDOCAINE-EPINEPHRINE 1 %-1:100000 IJ SOLN
INTRAMUSCULAR | Status: DC | PRN
  Administered 2022-11-22: 15 mL

## 2022-11-22 MED ORDER — CHLORHEXIDINE GLUCONATE CLOTH 2 % EX PADS: 6.0000 | MEDICATED_PAD | Freq: Once | CUTANEOUS | Status: DC

## 2022-11-22 MED ORDER — FENTANYL CITRATE (PF) 100 MCG/2ML IJ SOLN
INTRAMUSCULAR | Status: AC
  Filled 2022-11-22: qty 2

## 2022-11-22 MED ORDER — DEXAMETHASONE SODIUM PHOSPHATE 10 MG/ML IJ SOLN
INTRAMUSCULAR | Status: AC
  Filled 2022-11-22: qty 2

## 2022-11-22 MED ORDER — ROCURONIUM BROMIDE 100 MG/10ML IV SOLN
INTRAVENOUS | Status: DC | PRN
  Administered 2022-11-22: 40 mg via INTRAVENOUS

## 2022-11-22 MED ORDER — DEXAMETHASONE SODIUM PHOSPHATE 10 MG/ML IJ SOLN
INTRAMUSCULAR | Status: DC | PRN
  Administered 2022-11-22: 10 mg via INTRAVENOUS

## 2022-11-22 MED ORDER — SUGAMMADEX SODIUM 200 MG/2ML IV SOLN
INTRAVENOUS | Status: DC | PRN
  Administered 2022-11-22: 200 mg via INTRAVENOUS

## 2022-11-22 MED ORDER — LIDOCAINE 2% (20 MG/ML) 5 ML SYRINGE
INTRAMUSCULAR | Status: AC
  Filled 2022-11-22: qty 10

## 2022-11-22 MED ORDER — OXYCODONE HCL 5 MG PO TABS
ORAL_TABLET | ORAL | Status: AC
  Filled 2022-11-22: qty 1

## 2022-11-22 SURGICAL SUPPLY — 67 items
ADH SKN CLS APL DERMABOND .7 (GAUZE/BANDAGES/DRESSINGS) ×2
BINDER ABDOMINAL 10 UNV 27-48 (MISCELLANEOUS) IMPLANT
BINDER ABDOMINAL 12 SM 30-45 (SOFTGOODS) IMPLANT
BINDER ABDOMINAL 9 SM 30-45 (SOFTGOODS) IMPLANT
BINDER BREAST LRG (GAUZE/BANDAGES/DRESSINGS) IMPLANT
BINDER BREAST MEDIUM (GAUZE/BANDAGES/DRESSINGS) IMPLANT
BINDER BREAST XLRG (GAUZE/BANDAGES/DRESSINGS) IMPLANT
BINDER BREAST XXLRG (GAUZE/BANDAGES/DRESSINGS) IMPLANT
BLADE HEX COATED 2.75 (ELECTRODE) IMPLANT
BLADE SURG 10 STRL SS (BLADE) ×4 IMPLANT
BLADE SURG 15 STRL LF DISP TIS (BLADE) ×4 IMPLANT
BLADE SURG 15 STRL SS (BLADE) ×4
BNDG GAUZE DERMACEA FLUFF 4 (GAUZE/BANDAGES/DRESSINGS) ×1 IMPLANT
BNDG GZE DERMACEA 4 6PLY (GAUZE/BANDAGES/DRESSINGS) ×2
CANISTER SUCT 1200ML W/VALVE (MISCELLANEOUS) ×1 IMPLANT
COVER BACK TABLE 60X90IN (DRAPES) ×3 IMPLANT
COVER MAYO STAND STRL (DRAPES) ×3 IMPLANT
DERMABOND ADVANCED .7 DNX12 (GAUZE/BANDAGES/DRESSINGS) ×3 IMPLANT
DRAIN PENROSE 12X.25 LTX STRL (MISCELLANEOUS) ×1 IMPLANT
DRAPE U-SHAPE 47X51 STRL (DRAPES) ×1 IMPLANT
DRSG MEPILEX POST OP 4X8 (GAUZE/BANDAGES/DRESSINGS) ×1 IMPLANT
DRSG TEGADERM 4X4.75 (GAUZE/BANDAGES/DRESSINGS) ×1 IMPLANT
ELECT BLADE 4.0 EZ CLEAN MEGAD (MISCELLANEOUS) ×2
ELECT REM PT RETURN 9FT ADLT (ELECTROSURGICAL) ×2
ELECTRODE BLDE 4.0 EZ CLN MEGD (MISCELLANEOUS) ×1 IMPLANT
ELECTRODE REM PT RTRN 9FT ADLT (ELECTROSURGICAL) ×3 IMPLANT
EXTRACTOR CANIST REVOLVE STRL (CANNISTER) ×2 IMPLANT
GAUZE PAD ABD 8X10 STRL (GAUZE/BANDAGES/DRESSINGS) ×5 IMPLANT
GAUZE XEROFORM 1X8 LF (GAUZE/BANDAGES/DRESSINGS) ×2 IMPLANT
GLOVE BIO SURGEON STRL SZ 6.5 (GLOVE) ×9 IMPLANT
GOWN STRL REUS W/ TWL LRG LVL3 (GOWN DISPOSABLE) ×6 IMPLANT
GOWN STRL REUS W/ TWL XL LVL3 (GOWN DISPOSABLE) ×2 IMPLANT
GOWN STRL REUS W/TWL LRG LVL3 (GOWN DISPOSABLE) ×4
GOWN STRL REUS W/TWL XL LVL3 (GOWN DISPOSABLE) ×4
IV LACTATED RINGERS 1000ML (IV SOLUTION) ×4 IMPLANT
LINER CANISTER 1000CC FLEX (MISCELLANEOUS) ×2 IMPLANT
NDL HYPO 25X1 1.5 SAFETY (NEEDLE) ×2 IMPLANT
NEEDLE HYPO 25X1 1.5 SAFETY (NEEDLE) ×2 IMPLANT
PACK BASIN DAY SURGERY FS (CUSTOM PROCEDURE TRAY) ×3 IMPLANT
PAD ALCOHOL SWAB (MISCELLANEOUS) ×2 IMPLANT
PENCIL SMOKE EVACUATOR (MISCELLANEOUS) ×1 IMPLANT
SHEET MEDIUM DRAPE 40X70 STRL (DRAPES) ×2 IMPLANT
SLEEVE SCD COMPRESS KNEE MED (STOCKING) ×3 IMPLANT
SPIKE FLUID TRANSFER (MISCELLANEOUS) IMPLANT
SPONGE T-LAP 18X18 ~~LOC~~+RFID (SPONGE) ×4 IMPLANT
STOCKINETTE 6 STRL (DRAPES) ×1 IMPLANT
STRIP CLOSURE SKIN 1/2X4 (GAUZE/BANDAGES/DRESSINGS) ×1 IMPLANT
SUCTION TUBE FRAZIER 10FR DISP (MISCELLANEOUS) ×1 IMPLANT
SUT MNCRL AB 3-0 PS2 27 (SUTURE) ×2 IMPLANT
SUT MNCRL AB 4-0 PS2 18 (SUTURE) ×2 IMPLANT
SUT MON AB 5-0 PS2 18 (SUTURE) ×6 IMPLANT
SUT PDS 3-0 CT2 (SUTURE) ×4
SUT PDS II 3-0 CT2 27 ABS (SUTURE) ×2 IMPLANT
SUT SILK 4 0 PS 2 (SUTURE) ×1 IMPLANT
SUT VIC AB 4-0 PS2 18 (SUTURE) ×5 IMPLANT
SYR 10ML LL (SYRINGE) ×10 IMPLANT
SYR 3ML 18GX1 1/2 (SYRINGE) IMPLANT
SYR 50ML LL SCALE MARK (SYRINGE) ×2 IMPLANT
SYR CONTROL 10ML LL (SYRINGE) ×3 IMPLANT
SYR TOOMEY 50ML (SYRINGE) IMPLANT
TOWEL GREEN STERILE FF (TOWEL DISPOSABLE) ×6 IMPLANT
TRAY DSU PREP LF (CUSTOM PROCEDURE TRAY) ×3 IMPLANT
TUBE CONNECTING 20X1/4 (TUBING) ×1 IMPLANT
TUBING INFILTRATION IT-10001 (TUBING) ×2 IMPLANT
TUBING SET GRADUATE ASPIR 12FT (MISCELLANEOUS) ×2 IMPLANT
UNDERPAD 30X36 HEAVY ABSORB (UNDERPADS AND DIAPERS) ×5 IMPLANT
YANKAUER SUCT BULB TIP NO VENT (SUCTIONS) ×1 IMPLANT

## 2022-11-22 NOTE — Interval H&P Note (Signed)
History and Physical Interval Note:  11/22/2022 1:37 PM  Laurie Kelley  has presented today for surgery, with the diagnosis of Status post craniotomy.  The various methods of treatment have been discussed with the patient and family. After consideration of risks, benefits and other options for treatment, the patient has consented to  Procedure(s): fat grafting to forehead and temple after brain surgery (Right) as a surgical intervention.  The patient's history has been reviewed, patient examined, no change in status, stable for surgery.  I have reviewed the patient's chart and labs.  Questions were answered to the patient's satisfaction.     Alena Bills Keylin Podolsky

## 2022-11-22 NOTE — Discharge Instructions (Addendum)
Keep head elevated as able. Can shower in 2 days.  Post Anesthesia Home Care Instructions  Activity: Get plenty of rest for the remainder of the day. A responsible individual must stay with you for 24 hours following the procedure.  For the next 24 hours, DO NOT: -Drive a car -Advertising copywriter -Drink alcoholic beverages -Take any medication unless instructed by your physician -Make any legal decisions or sign important papers.  Meals: Start with liquid foods such as gelatin or soup. Progress to regular foods as tolerated. Avoid greasy, spicy, heavy foods. If nausea and/or vomiting occur, drink only clear liquids until the nausea and/or vomiting subsides. Call your physician if vomiting continues.  Special Instructions/Symptoms: Your throat may feel dry or sore from the anesthesia or the breathing tube placed in your throat during surgery. If this causes discomfort, gargle with warm salt water. The discomfort should disappear within 24 hours.  If you had a scopolamine patch placed behind your ear for the management of post- operative nausea and/or vomiting:  1. The medication in the patch is effective for 72 hours, after which it should be removed.  Wrap patch in a tissue and discard in the trash. Wash hands thoroughly with soap and water. 2. You may remove the patch earlier than 72 hours if you experience unpleasant side effects which may include dry mouth, dizziness or visual disturbances. 3. Avoid touching the patch. Wash your hands with soap and water after contact with the patch.    Post Anesthesia Home Care Instructions  Activity: Get plenty of rest for the remainder of the day. A responsible individual must stay with you for 24 hours following the procedure.  For the next 24 hours, DO NOT: -Drive a car -Advertising copywriter -Drink alcoholic beverages -Take any medication unless instructed by your physician -Make any legal decisions or sign important papers.  Meals: Start  with liquid foods such as gelatin or soup. Progress to regular foods as tolerated. Avoid greasy, spicy, heavy foods. If nausea and/or vomiting occur, drink only clear liquids until the nausea and/or vomiting subsides. Call your physician if vomiting continues.  Special Instructions/Symptoms: Your throat may feel dry or sore from the anesthesia or the breathing tube placed in your throat during surgery. If this causes discomfort, gargle with warm salt water. The discomfort should disappear within 24 hours.  If you had a scopolamine patch placed behind your ear for the management of post- operative nausea and/or vomiting:  1. The medication in the patch is effective for 72 hours, after which it should be removed.  Wrap patch in a tissue and discard in the trash. Wash hands thoroughly with soap and water. 2. You may remove the patch earlier than 72 hours if you experience unpleasant side effects which may include dry mouth, dizziness or visual disturbances. 3. Avoid touching the patch. Wash your hands with soap and water after contact with the patch.   Next tylenol dose 8pm, next motrin 0130 am

## 2022-11-22 NOTE — Transfer of Care (Signed)
Immediate Anesthesia Transfer of Care Note  Patient: Laurie Kelley  Procedure(s) Performed: Fat grafting to right  temple from right buttocks after brain surgery (Right: Head)  Patient Location: PACU  Anesthesia Type:General  Level of Consciousness: awake, alert , and oriented  Airway & Oxygen Therapy: Patient Spontanous Breathing and Patient connected to face mask oxygen  Post-op Assessment: Report given to RN and Post -op Vital signs reviewed and stable  Post vital signs: Reviewed and stable  Last Vitals:  Vitals Value Taken Time  BP 129/72 11/22/22 1613  Temp    Pulse 82 11/22/22 1615  Resp 17 11/22/22 1615  SpO2 100 % 11/22/22 1615  Vitals shown include unvalidated device data.  Last Pain:  Vitals:   11/22/22 1225  TempSrc: Oral  PainSc: 6          Complications: No notable events documented.

## 2022-11-22 NOTE — Anesthesia Preprocedure Evaluation (Addendum)
Anesthesia Evaluation  Patient identified by MRN, date of birth, ID band Patient awake    Reviewed: Allergy & Precautions, NPO status , Patient's Chart, lab work & pertinent test results  Airway Mallampati: III  TM Distance: >3 FB Neck ROM: Full    Dental no notable dental hx.    Pulmonary neg pulmonary ROS   Pulmonary exam normal        Cardiovascular negative cardio ROS Normal cardiovascular exam     Neuro/Psych  Headaches  negative psych ROS   GI/Hepatic negative GI ROS, Neg liver ROS,,,  Endo/Other  negative endocrine ROS    Renal/GU negative Renal ROS     Musculoskeletal negative musculoskeletal ROS (+)    Abdominal   Peds  Hematology negative hematology ROS (+)   Anesthesia Other Findings Status post craniotomy  Reproductive/Obstetrics S/p hysterectomy                             Anesthesia Physical Anesthesia Plan  ASA: 2  Anesthesia Plan: General   Post-op Pain Management:    Induction: Intravenous  PONV Risk Score and Plan: 3 and Ondansetron, Dexamethasone, Midazolam and Treatment may vary due to age or medical condition  Airway Management Planned: LMA  Additional Equipment:   Intra-op Plan:   Post-operative Plan: Extubation in OR  Informed Consent: I have reviewed the patients History and Physical, chart, labs and discussed the procedure including the risks, benefits and alternatives for the proposed anesthesia with the patient or authorized representative who has indicated his/her understanding and acceptance.     Dental advisory given  Plan Discussed with: CRNA  Anesthesia Plan Comments:        Anesthesia Quick Evaluation

## 2022-11-22 NOTE — Op Note (Signed)
DATE OF OPERATION: 11/22/2022  LOCATION: [facility]  PREOPERATIVE DIAGNOSIS: Right temple volume loss  POSTOPERATIVE DIAGNOSIS: Same  PROCEDURE:   SURGEON: Kashonda Sarkisyan Sanger Tyrome Donatelli, DO  ASSISTANT: Evelena Leyden, PA  EBL: 2 cc  CONDITION: Stable  COMPLICATIONS: None  INDICATION: The patient, Laurie Kelley, is a 52 y.o. female born on 11/24/1970, is here for treatment of right temple volume loss.   PROCEDURE DETAILS:  The patient was seen prior to surgery and marked.  The IV antibiotics were given. The patient was taken to the operating room and given a general anesthetic. A standard time out was performed and all information was confirmed by those in the room. SCDs were placed.  The patient was place in the left lateral position.  All bone areas were well padded.  She was on the bean bag.  She was prepped and draped.  Local was placed in the subdermal layer of the right scalp to create a space for the dermal graft.  The size was marked as 7 x 10 cm.  An incision was made with the #10 blade.  Dissection was then started to create a space of 7 x 10 cm of space superficial to the fascia.    Attention was then turned to the gluteal crease.  The local was injected with epinephrine.  The 7 x 10 cm area was marked.  The #10 blade was used to make the incision.  The graft was then de-epithelialized.  The graft was then excised in a taper formate with volume on the dermal end.  The gluteal area was closed with the 3-0 PDS followed by the 3-0 Monocryl in three layers with the 4-0 Monocryl used to close the skin.  Derma bond and steri strip with a sterile dressing was applied.   The graft was brought to the scalp.  The 4-0  Vicryl was used to tact the graft in place.  The suture was brought through the skin in 5 places.  It was then attached to the graft in the corresponding place. The suture was pulled to introduce the graft into place.  The sutures were secured to the skin over a  xeroform.  A penrose drain was placed. The incision was then closed with the 4-0 Monocryl.    The patient was allowed to wake up and taken to recovery room in stable condition at the end of the case. The family was notified at the end of the case.   The advanced practice practitioner (APP) assisted throughout the case.  The APP was essential in retraction and counter traction when needed to make the case progress smoothly.  This retraction and assistance made it possible to see the tissue plans for the procedure.  The assistance was needed for blood control, tissue re-approximation and assisted with closure of the incision site.

## 2022-11-22 NOTE — Anesthesia Procedure Notes (Signed)
Procedure Name: Intubation Date/Time: 11/22/2022 2:31 PM  Performed by: Lauralyn Primes, CRNAPre-anesthesia Checklist: Patient identified, Emergency Drugs available, Suction available and Patient being monitored Patient Re-evaluated:Patient Re-evaluated prior to induction Oxygen Delivery Method: Circle system utilized Preoxygenation: Pre-oxygenation with 100% oxygen Induction Type: IV induction Ventilation: Mask ventilation without difficulty Laryngoscope Size: Mac and 3 Grade View: Grade I Tube type: Oral Tube size: 7.0 mm Number of attempts: 1 Airway Equipment and Method: Stylet Placement Confirmation: ETT inserted through vocal cords under direct vision, positive ETCO2 and breath sounds checked- equal and bilateral Secured at: 23 cm Tube secured with: Tape Dental Injury: Teeth and Oropharynx as per pre-operative assessment

## 2022-11-24 ENCOUNTER — Ambulatory Visit (INDEPENDENT_AMBULATORY_CARE_PROVIDER_SITE_OTHER): Payer: Commercial Managed Care - PPO | Admitting: Plastic Surgery

## 2022-11-24 ENCOUNTER — Telehealth: Payer: Commercial Managed Care - PPO | Admitting: Physician Assistant

## 2022-11-24 VITALS — BP 113/71 | HR 83

## 2022-11-24 DIAGNOSIS — Q670 Congenital facial asymmetry: Secondary | ICD-10-CM

## 2022-11-24 DIAGNOSIS — Z9889 Other specified postprocedural states: Secondary | ICD-10-CM

## 2022-11-24 NOTE — Anesthesia Postprocedure Evaluation (Signed)
Anesthesia Post Note  Patient: Laurie Kelley  Procedure(s) Performed: Fat grafting to right  temple from right buttocks after brain surgery (Right: Head)     Patient location during evaluation: PACU Anesthesia Type: General Level of consciousness: awake Pain management: pain level controlled Vital Signs Assessment: post-procedure vital signs reviewed and stable Respiratory status: spontaneous breathing, nonlabored ventilation and respiratory function stable Cardiovascular status: blood pressure returned to baseline and stable Postop Assessment: no apparent nausea or vomiting Anesthetic complications: no   No notable events documented.  Last Vitals:  Vitals:   11/22/22 1645 11/22/22 1715  BP: 139/80 134/69  Pulse: 72 82  Resp: (!) 23 16  Temp:  (!) 36.2 C  SpO2: 98% 97%    Last Pain:  Vitals:   11/23/22 1426  TempSrc:   PainSc: 3                  Rocio Roam P Colletta Spillers

## 2022-11-24 NOTE — Progress Notes (Signed)
The patient is a 52 year old female here for follow-up after undergoing a dermal fat graft to her right temple area.  I spoke with her this morning and she had some concerns of lightheadedness, into the clinic.  Everything looks really good.  I changed the dressing on her gluteal area.  No sign of seroma or hematoma there.  She can shower with that dressing and she can take it off if she needs to if it starts to pull.  We went with a headband for her head dressing and I removed the drain since it had not drained anything in the last 24 hours.  Will plan to see her back on Tuesday.  Pictures were obtained of the patient and placed in the chart with the patient's or guardian's permission.

## 2022-11-25 ENCOUNTER — Encounter (HOSPITAL_BASED_OUTPATIENT_CLINIC_OR_DEPARTMENT_OTHER): Payer: Self-pay | Admitting: Plastic Surgery

## 2022-11-28 ENCOUNTER — Ambulatory Visit (INDEPENDENT_AMBULATORY_CARE_PROVIDER_SITE_OTHER): Payer: Commercial Managed Care - PPO | Admitting: Plastic Surgery

## 2022-11-28 DIAGNOSIS — Z9889 Other specified postprocedural states: Secondary | ICD-10-CM

## 2022-11-28 DIAGNOSIS — Q670 Congenital facial asymmetry: Secondary | ICD-10-CM

## 2022-11-28 NOTE — Progress Notes (Signed)
The patient is a 52 year old female here for follow-up after undergoing a dermal fat graft to her right temple.  Overall she is doing really well there is no sign of infection.  I went ahead and remove the bolster dressings today.  The incision is closed and looks really good.  There is no sign of seroma or hematoma.  It will take time for this to decrease in swelling in size.  I have asked the patient to be really patient to see what the effects will be over the next few weeks.

## 2022-12-15 ENCOUNTER — Ambulatory Visit (INDEPENDENT_AMBULATORY_CARE_PROVIDER_SITE_OTHER): Payer: Commercial Managed Care - PPO | Admitting: Physician Assistant

## 2022-12-15 VITALS — BP 106/60 | HR 76

## 2022-12-15 DIAGNOSIS — Z9889 Other specified postprocedural states: Secondary | ICD-10-CM

## 2022-12-15 NOTE — Progress Notes (Signed)
Patient is a 52 year old female s/p dermal fat graft to right sided forehead/temple region performed 11/22/2022 by Dr. Ulice Bold who presents to clinic for postoperative follow-up.  She was last seen here in clinic on 11/28/2022.  At that time, bolster was removed and there was no evidence of seroma or hematoma.  Swelling noted, but patient's was advised as it will likely decrease in volume over the coming weeks.  Today, patient was doing okay from a postoperative standpoint.  She did admit that she was frustrated with me specifically with regard to expectations of her surgery.  At the time of her preoperative exam, per my note, discussed both traditional fat grafting versus dermal fat grafting.  However, she only recalls discussing the possibility of traditional fat harvesting.  When she spoke with Dr. Ulice Bold in the preoperative area on day of surgery, discussed plans for dermal fat grafting with harvesting from gluteal cleft.  While she was agreeable, she tells me that she was not fully prepared mentally.  From a physical standpoint, her only complaint was that it felt tight at the dermal fat grafting site laterally towards the ear.  On exam, she did have a moderate amount of residual Dermabond overlying the right forehead/temple incision.  Monocryl sutures are visualized within the scabbing and skin glue.  No surrounding erythema.  No drainage or obvious wounds.  Chaperone present for evaluation of right gluteal cleft region.  It appears to be healing quite nicely.  Mild irritation at the most medial aspect, but no dehiscence noted or concern for infection.  Given that she was dissatisfied with me specifically with regard to surgical expectations, informed patient that her subsequent appointments would be with the surgeon directly to limit potential for any miscommunication.  Furthermore, it would help for further discussion about planning left-sided fat grafting for symmetry.  At that point, she  informed that she did not want to go through this again and was hopeful that each side had been done on the same day.  Discussed removal of one of her sutures above the right ear to potentially help relieve the tightness at that location.  Patient was agreeable.  Removed the sutures without complication or difficulty.  Patient expressed immediate improvement in her reported tightness.  She also admits that the dermal fat graft has softened and improved since her first postoperative week.  Recommended Vaseline to both the dermal fat harvest as well as graft site placement sites.  It would help with the residual Dermabond.  Also, while the gluteal cleft harvest site appears to be well-healed, it is mildly dry appearing.  Vaseline will likely help with the irritation medially.  Dr. Ulice Bold then personally evaluated patient at which point patient's frustration was further discussed.

## 2022-12-26 ENCOUNTER — Encounter: Payer: Commercial Managed Care - PPO | Admitting: Plastic Surgery

## 2022-12-26 ENCOUNTER — Encounter: Payer: Commercial Managed Care - PPO | Admitting: Physician Assistant

## 2022-12-30 ENCOUNTER — Other Ambulatory Visit: Payer: Self-pay | Admitting: Family Medicine

## 2023-01-25 ENCOUNTER — Encounter: Payer: Self-pay | Admitting: "Endocrinology

## 2023-01-25 ENCOUNTER — Ambulatory Visit (INDEPENDENT_AMBULATORY_CARE_PROVIDER_SITE_OTHER): Payer: Commercial Managed Care - PPO | Admitting: "Endocrinology

## 2023-01-25 ENCOUNTER — Other Ambulatory Visit: Payer: Self-pay | Admitting: "Endocrinology

## 2023-01-25 ENCOUNTER — Other Ambulatory Visit: Payer: Commercial Managed Care - PPO

## 2023-01-25 VITALS — BP 116/72 | HR 60 | Ht 65.0 in | Wt 186.0 lb

## 2023-01-25 DIAGNOSIS — E782 Mixed hyperlipidemia: Secondary | ICD-10-CM | POA: Diagnosis not present

## 2023-01-25 DIAGNOSIS — E210 Primary hyperparathyroidism: Secondary | ICD-10-CM | POA: Diagnosis not present

## 2023-01-25 NOTE — Progress Notes (Signed)
Endocrinology Consult Note                                            01/25/2023, 10:05 AM   Subjective:    Patient ID: Laurie Kelley, female    DOB: 01-27-1971, PCP Raliegh Ip, DO   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   Cancer (HCC)    Chest pain in adult 07/31/2017   Family history of brain tumor    Family history of brain tumor 06/27/2021   Gallstones    Headache    Hyperlipidemia with target LDL less than 100 08/18/2015   Internal hemorrhoids 08/18/2014   Lynch syndrome    PONV (postoperative nausea and vomiting)    Past Surgical History:  Procedure Laterality Date   ABDOMINAL HYSTERECTOMY N/A 03/08/2022   Procedure: HYSTERECTOMY ABDOMINAL;  Surgeon: Myna Hidalgo, DO;  Location: [facility];  Service: Gynecology;  Laterality: N/A;   ADJACENT TISSUE TRANSFER/TISSUE REARRANGEMENT Right 11/22/2022   Procedure: Fat grafting to right  temple from right buttocks after brain surgery;  Surgeon: Peggye Form, DO;  Location: [facility];  Service: Plastics;  Laterality: Right;   APPLICATION OF CRANIAL NAVIGATION Left 02/18/2021   Procedure: APPLICATION OF CRANIAL NAVIGATION;  Surgeon: Jadene Pierini, MD;  Location: [facility];  Service: Neurosurgery;  Laterality: Left;   APPLICATION OF CRANIAL NAVIGATION N/A 05/18/2021   Procedure: APPLICATION OF CRANIAL NAVIGATION;  Surgeon: Jadene Pierini, MD;  Location: [facility];  Service: Neurosurgery;  Laterality: N/A;   BIOPSY  06/28/2022   Procedure: BIOPSY;  Surgeon: Shellia Cleverly, DO;  Location: [facility];  Service: Gastroenterology;;   BRAIN SURGERY     CHOLECYSTECTOMY  1993   COLONOSCOPY WITH PROPOFOL N/A 06/28/2022   Procedure: COLONOSCOPY WITH PROPOFOL;  Surgeon: Shellia Cleverly, DO;  Location: [facility];  Service: Gastroenterology;  Laterality: N/A;   CRANIOTOMY Left  02/18/2021   Procedure: Left orbitozygomatic craniotomy for tumor resection  with Brain Lab;  Surgeon: Jadene Pierini, MD;  Location[facility];  Service: Neurosurgery;  Laterality: Left;   CRANIOTOMY Right 05/18/2021   Procedure: Right Orbitozygomatic craniotomy for tumor with brainlab;  Surgeon: Jadene Pierini, MD;  Location: [facility] OR;  Service: Neurosurgery;  Laterality: Right;   ESOPHAGOGASTRODUODENOSCOPY (EGD) WITH PROPOFOL N/A 06/28/2022   Procedure: ESOPHAGOGASTRODUODENOSCOPY (EGD) WITH PROPOFOL;  Surgeon: Shellia Cleverly, DO;  Location: [facility];  Service: Gastroenterology;  Laterality: N/A;   POLYPECTOMY  06/28/2022   Procedure: POLYPECTOMY;  Surgeon: Shellia Cleverly, DO;  Location: [facility];  Service: Gastroenterology;;   SALPINGOOPHORECTOMY Bilateral 03/08/2022   Procedure: OPEN SALPINGO OOPHORECTOMY;  Surgeon: Myna Hidalgo, DO;  Location: [facility];  Service: Gynecology;  Laterality: Bilateral;   SHOULDER ARTHROSCOPY Right 11/06/2019   Procedure: ARTHROSCOPY RIGHT SHOULDER, DEBRIDEMENT X TWO STRUCTURES; OPEN ROTATOR CUFF REPAIR;  Surgeon: Vickki Hearing, MD;  Location: [facility];  Service: Orthopedics;  Laterality: Right;   Social History   Socioeconomic History   Marital status: Divorced    Spouse name: Not on file   Number of children: 2   Years of education: Not on file   Highest education level: Associate degree: occupational, Scientist, product/process development, or vocational program  Occupational History   Not on file  Tobacco Use   Smoking status: Never   Smokeless tobacco: Never  Vaping Use   Vaping status: Never Used  Substance and Sexual Activity   Alcohol use: No   Drug use: No   Sexual activity: Not Currently    Birth control/protection: Injection  Other Topics Concern   Not on file  Social History Narrative   Not on file   Social Determinants of Health   Financial Resource Strain: Low Risk  (12/29/2022)   Received from [facility]   Overall Financial Resource  Strain (CARDIA)    Difficulty of Paying Living Expenses: Not hard at all  Food Insecurity: No Food Insecurity (12/29/2022)   Received from [facility]   Hunger Vital Sign    Worried About Running Out of Food in the Last Year: Never true    Ran Out of Food in the Last Year: Never true  Transportation Needs: No Transportation Needs (12/29/2022)   Received from [facility] - Transportation    Lack of Transportation (Medical): No    Lack of Transportation (Non-Medical): No  Physical Activity: Insufficiently Active (12/29/2022)   Received from [facility]   Exercise Vital Sign    Days of Exercise per Week: 2 days    Minutes of Exercise per Session: 10 min  Stress: Stress Concern Present (12/29/2022)   Received from [facility] - Occupational Stress Questionnaire    Feeling of Stress : To some extent  Social Connections: Somewhat Isolated (12/29/2022)   Received from [facility]   Social Network    How would you rate your social network (family, work, friends)?: Restricted participation with some degree of social isolation   Family History  Problem Relation Age of Onset   Cancer Mother    Hyperlipidemia Mother    Hypertension Father    Heart disease Father    Other Sister        meningiomas/tumors x11   Brain cancer Maternal Aunt        d. 68s-60s   Lung cancer Maternal Aunt    Bone cancer Maternal Aunt        d. 50s-60s   Liver cancer Paternal Uncle    Lung cancer Paternal Grandmother        d.85   Other Daughter        Lynch syndrome   Rectal cancer Neg Hx    Esophageal cancer Neg Hx    Stomach cancer Neg Hx    Breast cancer Neg Hx    Colon polyps Neg Hx    Colon cancer Neg Hx    Outpatient Encounter Medications as of 01/25/2023  Medication Sig   atorvastatin (LIPITOR) 20 MG tablet TAKE 1 TABLET BY MOUTH EVERY DAY   BIOTIN PO Take 1 tablet by mouth daily.   Cholecalciferol (DIALYVITE VITAMIN D 5000) 125 MCG  (5000 UT) capsule Take 5,000 Units by mouth daily.   gabapentin (NEURONTIN) 600 MG tablet Take 600 mg by mouth 3 (three) times daily.   hydrocortisone 1 % ointment Apply 1 Application topically 2 (two) times daily as needed for itching.   No facility-administered encounter medications on file as of 01/25/2023.   ALLERGIES: Allergies  Allergen Reactions   Gadolinium Derivatives Other (See Comments)    Pt reports she developed chest tightness, burning of her back, and weakness after MRI contrast 11/2021. Per Dr. Alfredo Batty, he suggests 13 hr protocol prior  to MRI.    Saline Nausea And Vomiting   Sodium Chloride Nausea And Vomiting    VACCINATION STATUS: Immunization History  Administered Date(s) Administered   Hepatitis B 05/11/2004, 06/09/2004   Hepatitis B, PED/ADOLESCENT 05/11/2004, 06/09/2004   Influenza,inj,Quad PF,6+ Mos 08/17/2016, 03/28/2018, 02/21/2019, 04/13/2020   Moderna SARS-COV2 Booster Vaccination 05/19/2020   Moderna Sars-Covid-2 Vaccination 07/04/2019, 08/06/2019   Tdap 08/11/2014    HPI Laurie Kelley is 52 y.o. female who presents today with a medical history as above. she is being seen in consultation for hypercalcemia/hyperparathyroidism requested by Raliegh Ip, DO.  History is obtained from the patient and chart review.  She reports that she has known about personal history of hypercalcemia since 2017 complicated by at least 1 episode of nephrolithiasis.  She also had history of low bone density per DEXA scan done in June 2023. She denies any family history of parathyroid dysfunction.  She reports personal  and family history of multiple neoplasms including benign brain tumors.  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 She was told that she has Lynch Syndrome. She denies any history of kidney disease, however she is documented  to have elevated alk phos over the last couple of years, fluctuated over time in relation to her severe dyslipidemia.  Her most recent labs show calcium at 11.3 associated with PTH of 122 on October 10, 2022. Most of her calcium measurements over the last 2 years ranged between 10.8-11.9. She does not have acute complaint today.  She is on vitamin D3 5000 units daily.  Her most recent 25-hydroxy vitamin D was 62.9.    Her current medications include atorvastatin 20 mg p.o. daily, gabapentin 600 mg 3 times a day, vitamin D3 5000 units daily, Biotin 5000 mcg daily.  Review of Systems  Constitutional: +mildly fluctuating body weight , no fatigue, no subjective hyperthermia, no subjective hypothermia Eyes: no blurry vision, no xerophthalmia ENT: no sore throat, no nodules palpated in throat, no dysphagia/odynophagia, no hoarseness Cardiovascular: no Chest Pain, no Shortness of Breath, no palpitations, no leg swelling Respiratory: no cough, no shortness of breath Gastrointestinal: no Nausea/Vomiting/Diarhhea Musculoskeletal: no muscle/joint aches Skin: no rashes Neurological: no tremors, no numbness, no tingling, no dizziness Psychiatric: no depression, no anxiety  Objective:       01/25/2023    8:19 AM 12/15/2022   10:02 AM 11/24/2022   12:08 PM  Vitals with BMI  Height 5\' 5"     Weight 186 lbs    BMI 30.95    Systolic 116 106 161  Diastolic 72 60 71  Pulse 60 76 83    BP 116/72   Pulse 60   Ht 5\' 5"  (1.651 m)   Wt 186 lb (84.4 kg)   BMI 30.95 kg/m   Wt Readings from Last 3 Encounters:  01/25/23 186 lb (84.4 kg)  11/22/22 179 lb 7.3 oz (81.4 kg)  10/10/22 176 lb (79.8 kg)    Physical Exam  Constitutional:  Body mass index is 30.95 kg/m.,  not in acute distress, normal state of mind Eyes: PERRLA, EOMI, no exophthalmos ENT: moist mucous membranes, no gross thyromegaly, no gross cervical lymphadenopathy Cardiovascular: normal precordial activity, Regular Rate and Rhythm, no  Murmur/Rubs/Gallops Respiratory:  adequate breathing efforts, no gross chest deformity, Clear to auscultation bilaterally Gastrointestinal: abdomen soft, Non -tender, No distension, Bowel Sounds present, no gross organomegaly Musculoskeletal: no gross deformities, strength  intact in all four extremities, no peripheral edema Skin: moist, warm, no rashes Neurological: no tremor with outstretched hands, Deep tendon reflexes normal in bilateral lower extremities.  CMP ( most recent) CMP     Component Value Date/Time   NA 142 10/10/2022 1021   K 4.2 10/10/2022 1021   CL 107 (H) 10/10/2022 1021   CO2 20 10/10/2022 1021   GLUCOSE 104 (H) 10/10/2022 1021   GLUCOSE 110 (H) 03/06/2022 0859   BUN 11 10/10/2022 1021   CREATININE 0.79 10/10/2022 1021   CREATININE 0.68 08/30/2012 1155   CALCIUM 11.3 (H) 10/10/2022 1021   PROT 6.8 10/10/2022 1021   ALBUMIN 4.4 10/10/2022 1021   AST 19 10/10/2022 1021   ALT 32 10/10/2022 1021   ALKPHOS 184 (H) 10/10/2022 1021   BILITOT 0.4 10/10/2022 1021   EGFR 92 10/10/2022 1021   GFRNONAA >60 03/06/2022 0859   GFRNONAA >89 08/30/2012 1155     Diabetic Labs (most recent): Lab Results  Component Value Date   HGBA1C 5.3 12/06/2021     Lipid Panel ( most recent) Lipid Panel     Component Value Date/Time   CHOL 128 12/06/2021 1135   CHOL 176 08/30/2012 1155   TRIG 116 12/06/2021 1135   TRIG 231 (H) 08/11/2014 1633   TRIG 68 08/30/2012 1155   HDL 36 (L) 12/06/2021 1135   HDL 27 (L) 08/11/2014 1633   HDL 39 (L) 08/30/2012 1155   CHOLHDL 3.6 12/06/2021 1135   LDLCALC 71 12/06/2021 1135   LDLCALC 123 (H) 08/30/2012 1155   LABVLDL 21 12/06/2021 1135      Lab Results  Component Value Date   TSH 2.170 07/26/2021   TSH 0.911 08/02/2020   TSH 1.490 08/11/2014   TSH 0.966 08/30/2012   FREET4 1.24 07/26/2021     DXA Scan on 12/06/2021   FINDINGS: Scan quality: Good.   LUMBAR SPINE (L1-L4):   BMD (in g/cm2): 0.949   Z-score: -2.9    Rate of change from previous exam: -13.2 %   LEFT FEMORAL NECK:   BMD (in g/cm2): 0.795   Z-score: -2.3   LEFT TOTAL HIP:   BMD (in g/cm2): 0.811   Z-score: -2.5   RIGHT FEMORAL NECK:   BMD (in g/cm2): 0.782   Z-score: -2.4   RIGHT TOTAL HIP:   BMD (in g/cm2): 0.777   Z-score: -2.7   Dual femur rate of change from previous exam: -8.9 %   IMPRESSION: Bone mineral density is below expected range for age.   RECOMMENDATIONS: 1. All patients should optimize calcium and vitamin D intake.  MRI brain with and without contrast April 12, 2022 IMPRESSION: Stable residual meningioma with intraosseous infiltration, right mastoid effusion, involvement of the right orbital apex with exophthalmos (detailed above).  Assessment & Plan:   1. Hyperparathyroidism, primary (HCC) 2. Hypercalcemia   - Laurie Kelley  is being seen at a kind request of Nadine Counts, Ashly M, DO. - I have reviewed her available  records and clinically evaluated the patient. - Based on these reviews, she has hypercalcemia/hyperparathyroidism,  however,  there is not sufficient information to proceed with definitive treatment plan.  - she will need a repeat,  more complete lab work towards confirming the diagnosis.  She will have repeat labs for intact PTH/calcium, magnesium, phosphorus, PTH related peptide. She will also have 24-hour urine calcium measurement to rule out FHH. Her 2023 DEXA scan was reviewed showing low bone mass for her age. If she is  confirmed to have primary hyperparathyroidism, she will be considered for surgery. In light of her 25-hydroxy vitamin D at 62.9, she is advised to hold vitamin D for now.  Metabolically, she will benefit from lifestyle medicine.  She has nonalcoholic fatty liver disease with elevated alk phos associated with severe dyslipidemia.  She will be approached for the whole package of active medicine on next visit.  In the meantime, she is advised to continue  atorvastatin 20 mg p.o. nightly. - I did not initiate any new prescriptions today. - she is advised to maintain close follow up with Raliegh Ip, DO for primary care needs.   -Thank you for involving me in the care of this pleasant patient.  Time spent with the patient: 45  minutes, of which >50% was spent in  counseling her about her hypercalcemia/hyperparathyroidism and the rest in obtaining information about her symptoms, reviewing her previous labs/studies ( including abstractions from other facilities),  evaluations, and treatments,  and developing a plan to confirm diagnosis and long term treatment based on the latest standards of care/guidelines; and documenting her care.  Avarey Gwenlyn Perking participated in the discussions, expressed understanding, and voiced agreement with the above plans.  All questions were answered to her satisfaction. she is encouraged to contact clinic should she have any questions or concerns prior to her return visit.  Follow up plan: Return in about 10 days (around 02/04/2023) for 24 Hr Urine Ca & Cr, F/U with Pre-visit Labs.   Marquis Lunch, MD [facility] [address] [place], [place] [address] Phone: [phone]  Fax: [phone]     01/25/2023, 10:05 AM  This note was partially dictated with voice recognition software. Similar sounding words can be transcribed inadequately or may not  be corrected upon review.

## 2023-01-29 ENCOUNTER — Other Ambulatory Visit: Payer: Commercial Managed Care - PPO

## 2023-02-02 ENCOUNTER — Ambulatory Visit (INDEPENDENT_AMBULATORY_CARE_PROVIDER_SITE_OTHER): Payer: Commercial Managed Care - PPO | Admitting: Family Medicine

## 2023-02-02 ENCOUNTER — Encounter: Payer: Self-pay | Admitting: Family Medicine

## 2023-02-02 VITALS — BP 125/81 | HR 91 | Temp 97.4°F | Ht 65.0 in | Wt 186.7 lb

## 2023-02-02 DIAGNOSIS — T8149XA Infection following a procedure, other surgical site, initial encounter: Secondary | ICD-10-CM | POA: Diagnosis not present

## 2023-02-02 MED ORDER — CEPHALEXIN 500 MG PO CAPS
500.0000 mg | ORAL_CAPSULE | Freq: Four times a day (QID) | ORAL | 0 refills | Status: AC
Start: 2023-02-02 — End: 2023-02-09

## 2023-02-02 NOTE — Progress Notes (Signed)
Subjective:  Patient ID: Laurie Kelley, female    DOB: [birth year], 52 y.o.   MRN: [id number]  Patient Care Team: Raliegh Ip, DO as PCP - General (Family Medicine)   Chief Complaint:  Post-op Problem (Patient had fat graphing after a brain tumor on June 5th. States there is a scab over the site and still bleeds. )   HPI: Laurie Kelley is a 52 y.o. female presenting on 02/02/2023 for Post-op Problem (Patient had fat graphing after a brain tumor on June 5th. States there is a scab over the site and still bleeds. )   Pt had fat grafting on her right temporal region on 11/21/2021. States a few weeks ago a scab formed and now she has some erythema and tenderness to the sight. No fever, chills, weakness, or confusion.    Relevant past medical, surgical, family, and social history reviewed and updated as indicated.  Allergies and medications reviewed and updated. Data reviewed: Chart in Epic.   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   Cancer (HCC)    Chest pain in adult 07/31/2017   Family history of brain tumor    Family history of brain tumor 06/27/2021   Gallstones    Headache    Hyperlipidemia with target LDL less than 100 08/18/2015   Internal hemorrhoids 08/18/2014   Lynch syndrome    PONV (postoperative nausea and vomiting)     Past Surgical History:  Procedure Laterality Date   ABDOMINAL HYSTERECTOMY N/A 03/08/2022   Procedure: HYSTERECTOMY ABDOMINAL;  Surgeon: Myna Hidalgo, DO;  Location: [facility];  Service: Gynecology;  Laterality: N/A;   ADJACENT TISSUE TRANSFER/TISSUE REARRANGEMENT Right 11/22/2022   Procedure: Fat grafting to right  temple from right buttocks after brain surgery;  Surgeon: Peggye Form, DO;  Location: [facility];  Service: Plastics;  Laterality: Right;   APPLICATION OF CRANIAL NAVIGATION Left 02/18/2021    Procedure: APPLICATION OF CRANIAL NAVIGATION;  Surgeon: Jadene Pierini, MD;  Location: [facility];  Service: Neurosurgery;  Laterality: Left;   APPLICATION OF CRANIAL NAVIGATION N/A 05/18/2021   Procedure: APPLICATION OF CRANIAL NAVIGATION;  Surgeon: Jadene Pierini, MD;  Location: [facility];  Service: Neurosurgery;  Laterality: N/A;   BIOPSY  06/28/2022   Procedure: BIOPSY;  Surgeon: Shellia Cleverly, DO;  Location: [facility];  Service: Gastroenterology;;   BRAIN SURGERY     CHOLECYSTECTOMY  1993   COLONOSCOPY WITH PROPOFOL N/A 06/28/2022   Procedure: COLONOSCOPY WITH PROPOFOL;  Surgeon: Shellia Cleverly, DO;  Location: [facility];  Service: Gastroenterology;  Laterality: N/A;   CRANIOTOMY Left 02/18/2021   Procedure: Left orbitozygomatic craniotomy for tumor resection  with Brain Lab;  Surgeon: Jadene Pierini, MD;  Location: [facility];  Service: Neurosurgery;  Laterality: Left;   CRANIOTOMY Right 05/18/2021   Procedure: Right Orbitozygomatic craniotomy for tumor with brainlab;  Surgeon: Jadene Pierini, MD;  Location: [facility] OR;  Service: Neurosurgery;  Laterality: Right;   ESOPHAGOGASTRODUODENOSCOPY (EGD) WITH PROPOFOL N/A 06/28/2022   Procedure: ESOPHAGOGASTRODUODENOSCOPY (EGD) WITH PROPOFOL;  Surgeon: Shellia Cleverly, DO;  Location: [facility];  Service: Gastroenterology;  Laterality: N/A;   POLYPECTOMY  06/28/2022   Procedure: POLYPECTOMY;  Surgeon: Shellia Cleverly, DO;  Location: [facility];  Service: Gastroenterology;;   SALPINGOOPHORECTOMY Bilateral 03/08/2022   Procedure: OPEN SALPINGO OOPHORECTOMY;  Surgeon:  Myna Hidalgo, DO;  Location: [facility];  Service: Gynecology;  Laterality: Bilateral;   SHOULDER ARTHROSCOPY Right 11/06/2019   Procedure: ARTHROSCOPY RIGHT SHOULDER, DEBRIDEMENT X TWO STRUCTURES; OPEN ROTATOR CUFF REPAIR;  Surgeon: Vickki Hearing, MD;  Location: [facility];  Service: Orthopedics;  Laterality: Right;    Social History   Socioeconomic History    Marital status: Divorced    Spouse name: Not on file   Number of children: 2   Years of education: Not on file   Highest education level: Associate degree: occupational, Scientist, product/process development, or vocational program  Occupational History   Not on file  Tobacco Use   Smoking status: Never   Smokeless tobacco: Never  Vaping Use   Vaping status: Never Used  Substance and Sexual Activity   Alcohol use: No   Drug use: No   Sexual activity: Not Currently    Birth control/protection: Injection  Other Topics Concern   Not on file  Social History Narrative   Not on file   Social Determinants of Health   Financial Resource Strain: Low Risk  (12/29/2022)   Received [facility]   Overall Financial Resource Strain (CARDIA)    Difficulty of Paying Living Expenses: Not hard at all  Food Insecurity: No Food Insecurity (12/29/2022)   [facility]   Hunger Vital Sign    Worried About Running Out of Food in the Last Year: Never true    Ran Out of Food in the Last Year: Never true  Transportation Needs: No Transportation Needs (12/29/2022)   Received from [facility] - Transportation    Lack of Transportation (Medical): No    Lack of Transportation (Non-Medical): No  Physical Activity: Insufficiently Active (12/29/2022)   Received from [facility]   Exercise Vital Sign    Days of Exercise per Week: 2 days    Minutes of Exercise per Session: 10 min  Stress: Stress Concern Present (12/29/2022)   Received from [facility] - Occupational Stress Questionnaire    Feeling of Stress : To some extent  Social Connections: Somewhat Isolated (12/29/2022)   Received from [facility]   Social Network    How would you rate your social network (family, work, friends)?: Restricted participation with some degree of social isolation  Intimate Partner Violence: Not At Risk (12/29/2022)   Received from [facility]   HITS    Over the last  12 months how often did your partner physically hurt you?: 1    Over the last 12 months how often did your partner insult you or talk down to you?: 1    Over the last 12 months how often did your partner threaten you with physical harm?: 1    Over the last 12 months how often did your partner scream or curse at you?: 1    Outpatient Encounter Medications as of 02/02/2023  Medication Sig   atorvastatin (LIPITOR) 20 MG tablet TAKE 1 TABLET BY MOUTH EVERY DAY   BIOTIN PO Take 1 tablet by mouth daily.   cephALEXin (KEFLEX) 500 MG capsule Take 1 capsule (500 mg total) by mouth 4 (four) times daily for 7 days.   gabapentin (NEURONTIN) 600 MG tablet Take 600 mg by mouth 3 (three) times daily.   hydrocortisone 1 % ointment Apply 1 Application topically 2 (two) times daily as needed for itching.   Cholecalciferol (DIALYVITE VITAMIN D 5000) 125 MCG (5000 UT) capsule Take 5,000  Units by mouth daily. (Patient not taking: Reported on 02/02/2023)   No facility-administered encounter medications on file as of 02/02/2023.    Allergies  Allergen Reactions   Gadolinium Derivatives Other (See Comments)    Pt reports she developed chest tightness, burning of her back, and weakness after MRI contrast 11/2021. Per Dr. Alfredo Batty, he suggests 13 hr protocol prior to MRI.    Saline Nausea And Vomiting   Sodium Chloride Nausea And Vomiting    Review of Systems  Constitutional:  Negative for activity change, appetite change, chills, diaphoresis, fatigue, fever and unexpected weight change.  HENT: Negative.    Eyes: Negative.  Negative for photophobia and visual disturbance.  Respiratory:  Negative for cough, chest tightness and shortness of breath.   Cardiovascular:  Negative for chest pain, palpitations and leg swelling.  Gastrointestinal:  Negative for abdominal pain, blood in stool, constipation, diarrhea, nausea and vomiting.  Endocrine: Negative.  Negative for polydipsia, polyphagia and polyuria.   Genitourinary:  Negative for decreased urine volume, difficulty urinating, dysuria, frequency and urgency.  Musculoskeletal:  Negative for arthralgias and myalgias.  Skin:  Positive for color change and wound. Negative for pallor and rash.  Allergic/Immunologic: Negative.   Neurological:  Negative for dizziness, tremors, seizures, syncope, facial asymmetry, speech difficulty, weakness, light-headedness, numbness and headaches.  Hematological: Negative.   Psychiatric/Behavioral:  Negative for confusion, hallucinations, sleep disturbance and suicidal ideas.   All other systems reviewed and are negative.       Objective:  BP 125/81   Pulse 91   Temp (!) 97.4 F (36.3 C)   Ht 5\' 5"  (1.651 m)   Wt 186 lb 11.2 oz (84.7 kg)   SpO2 100%   BMI 31.07 kg/m    Wt Readings from Last 3 Encounters:  02/02/23 186 lb 11.2 oz (84.7 kg)  01/25/23 186 lb (84.4 kg)  11/22/22 179 lb 7.3 oz (81.4 kg)    Physical Exam Vitals and nursing note reviewed.  Constitutional:      General: She is not in acute distress.    Appearance: Normal appearance. She is obese. She is not ill-appearing, toxic-appearing or diaphoretic.  HENT:     Head:      Mouth/Throat:     Mouth: Mucous membranes are moist.  Eyes:     Pupils: Pupils are equal, round, and reactive to light.  Cardiovascular:     Rate and Rhythm: Normal rate and regular rhythm.     Heart sounds: Normal heart sounds.  Pulmonary:     Effort: Pulmonary effort is normal.     Breath sounds: Normal breath sounds.  Skin:    General: Skin is warm and dry.     Capillary Refill: Capillary refill takes less than 2 seconds.  Neurological:     General: No focal deficit present.     Mental Status: She is alert and oriented to person, place, and time.  Psychiatric:        Mood and Affect: Mood normal.        Behavior: Behavior normal.        Thought Content: Thought content normal.        Judgment: Judgment normal.     Results for orders placed or  performed in visit on 01/25/23  PTH, intact and calcium  Result Value Ref Range   Calcium 11.0 (H) 8.7 - 10.2 mg/dL   PTH 51 15 - 65 pg/mL   PTH Interp Comment   Magnesium  Result Value Ref Range  Magnesium 2.0 1.6 - 2.3 mg/dL  Phosphorus  Result Value Ref Range   Phosphorus 2.8 (L) 3.0 - 4.3 mg/dL  PTH-related peptide  Result Value Ref Range   PTH-related peptide WILL FOLLOW   Calcium, urine, 24 hour  Result Value Ref Range   Calcium, Urine 21.5 Not Estab. mg/dL   Calcium, 30Q Urine 657 0 - 320 mg/24 hr  Creatinine, urine, 24 hour  Result Value Ref Range   Creatinine, Urine 93.5 Not Estab. mg/dL   Creatinine, 84O Ur 9,629 800 - 1,800 mg/24 hr       Pertinent labs & imaging results that were available during my care of the patient were reviewed by me and considered in my medical decision making.  Assessment & Plan:  Kandy was seen today for post-op problem.  Diagnoses and all orders for this visit:  Surgical wound infection Pt did not want to drive to [facility] to see surgeon. Aware that we will start below but if things do not improve significantly she will have to follow up with surgeon. Aware or red flags. Take medications as prescribed. Report new, worsening, or persistent symptoms.  -     cephALEXin (KEFLEX) 500 MG capsule; Take 1 capsule (500 mg total) by mouth 4 (four) times daily for 7 days.     Continue all other maintenance medications.  Follow up plan: Return if symptoms worsen or fail to improve.   Continue healthy lifestyle choices, including diet (rich in fruits, vegetables, and lean proteins, and low in salt and simple carbohydrates) and exercise (at least 30 minutes of moderate physical activity daily).   The above assessment and management plan was discussed with the patient. The patient verbalized understanding of and has agreed to the management plan. Patient is aware to call the clinic if they develop any new symptoms or if symptoms persist or  worsen. Patient is aware when to return to the clinic for a follow-up visit. Patient educated on when it is appropriate to go to the emergency department.   Kari Baars, FNP-C [facility] [phone]

## 2023-02-03 LAB — PTH-RELATED PEPTIDE: PTH-related peptide: <2 pmol/L

## 2023-02-12 ENCOUNTER — Encounter: Payer: Self-pay | Admitting: "Endocrinology

## 2023-02-12 ENCOUNTER — Ambulatory Visit (INDEPENDENT_AMBULATORY_CARE_PROVIDER_SITE_OTHER): Payer: Commercial Managed Care - PPO | Admitting: "Endocrinology

## 2023-02-12 ENCOUNTER — Ambulatory Visit: Payer: Commercial Managed Care - PPO | Admitting: "Endocrinology

## 2023-02-12 VITALS — BP 106/78 | HR 64 | Ht 65.0 in | Wt 185.0 lb

## 2023-02-12 DIAGNOSIS — E210 Primary hyperparathyroidism: Secondary | ICD-10-CM

## 2023-02-12 DIAGNOSIS — E782 Mixed hyperlipidemia: Secondary | ICD-10-CM | POA: Diagnosis not present

## 2023-02-12 NOTE — Progress Notes (Signed)
02/12/2023, 12:59 PM  Endocrinology follow-up note   Subjective:    Patient ID: Laurie Kelley, female    DOB: 10-22-1970, PCP Raliegh Ip, DO   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   Cancer (HCC)    Chest pain in adult 07/31/2017   Family history of brain tumor    Family history of brain tumor 06/27/2021   Gallstones    Headache    Hyperlipidemia with target LDL less than 100 08/18/2015   Internal hemorrhoids 08/18/2014   Lynch syndrome    PONV (postoperative nausea and vomiting)    Past Surgical History:  Procedure Laterality Date   ABDOMINAL HYSTERECTOMY N/A 03/08/2022   Procedure: HYSTERECTOMY ABDOMINAL;  Surgeon: Myna Hidalgo, DO;  Location: [facility];  Service: Gynecology;  Laterality: N/A;   ADJACENT TISSUE TRANSFER/TISSUE REARRANGEMENT Right 11/22/2022   Procedure: Fat grafting to right  temple from right buttocks after brain surgery;  Surgeon: Peggye Form, DO;  Location: [facility];  Service: Plastics;  Laterality: Right;   APPLICATION OF CRANIAL NAVIGATION Left 02/18/2021   Procedure: APPLICATION OF CRANIAL NAVIGATION;  Surgeon: Jadene Pierini, MD;  Location: [facility];  Service: Neurosurgery;  Laterality: Left;   APPLICATION OF CRANIAL NAVIGATION N/A 05/18/2021   Procedure: APPLICATION OF CRANIAL NAVIGATION;  Surgeon: Jadene Pierini, MD;  Location: [facility];  Service: Neurosurgery;  Laterality: N/A;   BIOPSY  06/28/2022   Procedure: BIOPSY;  Surgeon: Shellia Cleverly, DO;  Location: [facility];  Service: Gastroenterology;;   BRAIN SURGERY     CHOLECYSTECTOMY  1993   COLONOSCOPY WITH PROPOFOL N/A 06/28/2022   Procedure: COLONOSCOPY WITH PROPOFOL;  Surgeon: Shellia Cleverly, DO;  Location: [facility];  Service: Gastroenterology;  Laterality: N/A;   CRANIOTOMY Left  02/18/2021   Procedure: Left orbitozygomatic craniotomy for tumor resection  with Brain Lab;  Surgeon: Jadene Pierini, MD;  Location: [facility];  Service: Neurosurgery;  Laterality: Left;   CRANIOTOMY Right 05/18/2021   Procedure: Right Orbitozygomatic craniotomy for tumor with brainlab;  Surgeon: Jadene Pierini, MD;  Location: [facility] OR;  Service: Neurosurgery;  Laterality: Right;   ESOPHAGOGASTRODUODENOSCOPY (EGD) WITH PROPOFOL N/A 06/28/2022   Procedure: ESOPHAGOGASTRODUODENOSCOPY (EGD) WITH PROPOFOL;  Surgeon: Shellia Cleverly, DO;  Location: [facility];  Service: Gastroenterology;  Laterality: N/A;   POLYPECTOMY  06/28/2022   Procedure: POLYPECTOMY;  Surgeon: Shellia Cleverly, DO;  Location: [facility];  Service: Gastroenterology;;   SALPINGOOPHORECTOMY Bilateral 03/08/2022   Procedure: OPEN SALPINGO OOPHORECTOMY;  Surgeon: Myna Hidalgo, DO;  Location: [facility];  Service: Gynecology;  Laterality: Bilateral;   SHOULDER ARTHROSCOPY Right 11/06/2019   Procedure: ARTHROSCOPY RIGHT SHOULDER, DEBRIDEMENT X TWO STRUCTURES; OPEN ROTATOR CUFF REPAIR;  Surgeon: Vickki Hearing, MD;  Location: [facility];  Service: Orthopedics;  Laterality: Right;   Social History   Socioeconomic History   Marital status: Divorced    Spouse name: Not on file   Number of children: 2   Years of education: Not on file   Highest education level: Associate degree: occupational, Scientist, product/process development, or vocational program  Occupational History  Not on file  Tobacco Use   Smoking status: Never   Smokeless tobacco: Never  Vaping Use   Vaping status: Never Used  Substance and Sexual Activity   Alcohol use: No   Drug use: No   Sexual activity: Not Currently    Birth control/protection: Injection  Other Topics Concern   Not on file  Social History Narrative   Not on file   Social Determinants of Health   Financial Resource Strain: Low Risk  (12/29/2022)   Received from [facility]   Overall Financial Resource  Strain (CARDIA)    Difficulty of Paying Living Expenses: Not hard at all  Food Insecurity: No Food Insecurity (12/29/2022)   Received from [facility]   Hunger Vital Sign    Worried About Running Out of Food in the Last Year: Never true    Ran Out of Food in the Last Year: Never true  Transportation Needs: No Transportation Needs (12/29/2022)   Received from [facility] - Transportation    Lack of Transportation (Medical): No    Lack of Transportation (Non-Medical): No  Physical Activity: Insufficiently Active (12/29/2022)   Received from [facility]   Exercise Vital Sign    Days of Exercise per Week: 2 days    Minutes of Exercise per Session: 10 min  Stress: Stress Concern Present (12/29/2022)   Received from [facility] - Occupational Stress Questionnaire    Feeling of Stress : To some extent  Social Connections: Somewhat Isolated (12/29/2022)   Received from [facility]   Social Network    How would you rate your social network (family, work, friends)?: Restricted participation with some degree of social isolation   Family History  Problem Relation Age of Onset   Cancer Mother    Hyperlipidemia Mother    Hypertension Father    Heart disease Father    Other Sister        meningiomas/tumors x11   Brain cancer Maternal Aunt        d. 34s-60s   Lung cancer Maternal Aunt    Bone cancer Maternal Aunt        d. 50s-60s   Liver cancer Paternal Uncle    Lung cancer Paternal Grandmother        d.85   Other Daughter        Lynch syndrome   Rectal cancer Neg Hx    Esophageal cancer Neg Hx    Stomach cancer Neg Hx    Breast cancer Neg Hx    Colon polyps Neg Hx    Colon cancer Neg Hx    Outpatient Encounter Medications as of 02/12/2023  Medication Sig   atorvastatin (LIPITOR) 20 MG tablet TAKE 1 TABLET BY MOUTH EVERY DAY   BIOTIN PO Take 1 tablet by mouth daily.   Cholecalciferol (DIALYVITE VITAMIN D 5000) 125 MCG  (5000 UT) capsule Take 5,000 Units by mouth daily. (Patient not taking: Reported on 02/02/2023)   gabapentin (NEURONTIN) 600 MG tablet Take 600 mg by mouth 3 (three) times daily.   hydrocortisone 1 % ointment Apply 1 Application topically 2 (two) times daily as needed for itching.   No facility-administered encounter medications on file as of 02/12/2023.   ALLERGIES: Allergies  Allergen Reactions   Gadolinium Derivatives Other (See Comments)    Pt reports she developed chest tightness, burning of her back, and weakness after MRI contrast 11/2021. Per Dr. Alfredo Batty, he suggests 13 hr  protocol prior to MRI.    Saline Nausea And Vomiting   Sodium Chloride Nausea And Vomiting    VACCINATION STATUS: Immunization History  Administered Date(s) Administered   Hepatitis B 05/11/2004, 06/09/2004   Hepatitis B, PED/ADOLESCENT 05/11/2004, 06/09/2004   Influenza,inj,Quad PF,6+ Mos 08/17/2016, 03/28/2018, 02/21/2019, 04/13/2020   Moderna SARS-COV2 Booster Vaccination 05/19/2020   Moderna Sars-Covid-2 Vaccination 07/04/2019, 08/06/2019   Tdap 08/11/2014    HPI Laurie Kelley is 52 y.o. female who presents today with a medical history as above. she is being seen in follow-up after she was seen in consultation for hypercalcemia/hyperparathyroidism requested by Raliegh Ip, DO.   She notes from her first visit.  She reports that she has known about personal history of hypercalcemia since 2017 complicated by at least 1 episode of nephrolithiasis.  She also had history of low bone density per DEXA scan done in June 2023. She denies any family history of parathyroid dysfunction.  She reports personal  and family history of multiple neoplasms including benign brain tumors.  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 She was told that she has Lynch Syndrome. She denies  any history of kidney disease, however she is documented to have elevated alk phos over the last couple of years, fluctuated over time in relation to her severe dyslipidemia.  Her most recent labs show calcium at 11.3 associated with PTH of 122 on October 10, 2022. Most of her calcium measurements over the last 2 years ranged between 10.8-11.9. She does not have acute complaint today.  She is status posttreatment for vitamin D deficiency.  She remains on vitamin D 3 5000 units p.o. daily.  Her most recent 25-hydroxy vitamin D was 62.9.   Her previsit labs show PTH 51, calcium at 11, 24-hour urine calcium 247.  Her current medications include atorvastatin 20 mg p.o. daily, gabapentin 600 mg 3 times a day, Biotin 5000 mcg daily.  Review of Systems  Constitutional: +mildly fluctuating body weight , no fatigue, no subjective hyperthermia, no subjective hypothermia Eyes: no blurry vision, no xerophthalmia ENT: no sore throat, no nodules palpated in throat, no dysphagia/odynophagia, no hoarseness Cardiovascular: no Chest Pain, no Shortness of Breath, no palpitations, no leg swelling Respiratory: no cough, no shortness of breath Gastrointestinal: no Nausea/Vomiting/Diarhhea Musculoskeletal: no muscle/joint aches Skin: no rashes Neurological: no tremors, no numbness, no tingling, no dizziness Psychiatric: no depression, no anxiety  Objective:       02/12/2023   10:40 AM 02/02/2023    3:57 PM 01/25/2023    8:19 AM  Vitals with BMI  Height 5\' 5"  5\' 5"  5\' 5"   Weight 185 lbs 186 lbs 11 oz 186 lbs  BMI 30.79 31.07 30.95  Systolic 106 125 147  Diastolic 78 81 72  Pulse 64 91 60    BP 106/78   Pulse 64   Ht 5\' 5"  (1.651 m)   Wt 185 lb (83.9 kg)   BMI 30.79 kg/m   Wt Readings from Last 3 Encounters:  02/12/23 185 lb (83.9 kg)  02/02/23 186 lb 11.2 oz (84.7 kg)  01/25/23 186 lb (84.4 kg)    Physical Exam  Constitutional:  Body mass index is 30.79 kg/m.,  not in acute distress, normal  state of mind Eyes: PERRLA, EOMI, no exophthalmos ENT: moist mucous membranes, no gross thyromegaly, no gross cervical lymphadenopathy   CMP ( most recent) CMP  Component Value Date/Time   NA 142 10/10/2022 1021   K 4.2 10/10/2022 1021   CL 107 (H) 10/10/2022 1021   CO2 20 10/10/2022 1021   GLUCOSE 104 (H) 10/10/2022 1021   GLUCOSE 110 (H) 03/06/2022 0859   BUN 11 10/10/2022 1021   CREATININE 0.79 10/10/2022 1021   CREATININE 0.68 08/30/2012 1155   CALCIUM 11.0 (H) 01/25/2023 0948   PROT 6.8 10/10/2022 1021   ALBUMIN 4.4 10/10/2022 1021   AST 19 10/10/2022 1021   ALT 32 10/10/2022 1021   ALKPHOS 184 (H) 10/10/2022 1021   BILITOT 0.4 10/10/2022 1021   EGFR 92 10/10/2022 1021   GFRNONAA >60 03/06/2022 0859   GFRNONAA >89 08/30/2012 1155     Diabetic Labs (most recent): Lab Results  Component Value Date   HGBA1C 5.3 12/06/2021     Lipid Panel ( most recent) Lipid Panel     Component Value Date/Time   CHOL 128 12/06/2021 1135   CHOL 176 08/30/2012 1155   TRIG 116 12/06/2021 1135   TRIG 231 (H) 08/11/2014 1633   TRIG 68 08/30/2012 1155   HDL 36 (L) 12/06/2021 1135   HDL 27 (L) 08/11/2014 1633   HDL 39 (L) 08/30/2012 1155   CHOLHDL 3.6 12/06/2021 1135   LDLCALC 71 12/06/2021 1135   LDLCALC 123 (H) 08/30/2012 1155   LABVLDL 21 12/06/2021 1135      Lab Results  Component Value Date   TSH 2.170 07/26/2021   TSH 0.911 08/02/2020   TSH 1.490 08/11/2014   TSH 0.966 08/30/2012   FREET4 1.24 07/26/2021     DXA Scan on 12/06/2021   FINDINGS: Scan quality: Good.   LUMBAR SPINE (L1-L4):   BMD (in g/cm2): 0.949   Z-score: -2.9   Rate of change from previous exam: -13.2 %   LEFT FEMORAL NECK:   BMD (in g/cm2): 0.795   Z-score: -2.3   LEFT TOTAL HIP:   BMD (in g/cm2): 0.811   Z-score: -2.5   RIGHT FEMORAL NECK:   BMD (in g/cm2): 0.782   Z-score: -2.4   RIGHT TOTAL HIP:   BMD (in g/cm2): 0.777   Z-score: -2.7   Dual femur rate of  change from previous exam: -8.9 %   IMPRESSION: Bone mineral density is below expected range for age.   RECOMMENDATIONS: 1. All patients should optimize calcium and vitamin D intake.  MRI brain with and without contrast April 12, 2022 IMPRESSION: Stable residual meningioma with intraosseous infiltration, right mastoid effusion, involvement of the right orbital apex with exophthalmos (detailed above).  Assessment & Plan:   1. Hyperparathyroidism, primary (HCC) 2. Hypercalcemia  - I have reviewed her  new and available  records and clinically evaluated the patient. - Based on these reviews, she has hypercalcemia/hyperparathyroidism. Her workup so far indicates a high likelihood of primary hyperparathyroidism and unlikely FHH. Her 2023 DEXA scan was reviewed showing low bone mass for her age. -She would benefit from surgical exploration for hyperparathyroidism, she will be sent to Dr. Darnell Level. She is s/p treatment for vitamin D deficiency.  She will be seen with repeat labs after her surgery.  Metabolically, she will benefit from lifestyle medicine.  She has nonalcoholic fatty liver disease with elevated alk phos associated with severe dyslipidemia.  She will be approached for the whole package of active medicine on next visit.  In the meantime, she is advised to continue atorvastatin 20 mg p.o. nightly.  - she is advised to maintain close follow up  with Raliegh Ip, DO for primary care needs.   I spent  22  minutes in the care of the patient today including review of labs from Thyroid Function, CMP, and other relevant labs ; imaging/biopsy records (current and previous including abstractions from other facilities); face-to-face time discussing  her lab results and symptoms, medications doses, her options of short and long term treatment based on the latest standards of care / guidelines;   and documenting the encounter.  Hae Gwenlyn Perking  participated in the discussions,  expressed understanding, and voiced agreement with the above plans.  All questions were answered to her satisfaction. she is encouraged to contact clinic should she have any questions or concerns prior to her return visit.   Follow up plan: Return in about 8 weeks (around 04/09/2023) for F/U with Labs after Surgery.   Marquis Lunch, MD [facility] [address] [place], [place] [address] Phone: [phone]  Fax: [phone]     02/12/2023, 12:59 PM  This note was partially dictated with voice recognition software. Similar sounding words can be transcribed inadequately or may not  be corrected upon review.

## 2023-02-27 ENCOUNTER — Encounter: Payer: Self-pay | Admitting: Family Medicine

## 2023-02-27 ENCOUNTER — Ambulatory Visit (INDEPENDENT_AMBULATORY_CARE_PROVIDER_SITE_OTHER): Payer: Commercial Managed Care - PPO | Admitting: Family Medicine

## 2023-02-27 VITALS — BP 120/69 | HR 69 | Temp 98.5°F | Ht 65.0 in | Wt 183.6 lb

## 2023-02-27 DIAGNOSIS — Z0001 Encounter for general adult medical examination with abnormal findings: Secondary | ICD-10-CM | POA: Diagnosis not present

## 2023-02-27 DIAGNOSIS — R413 Other amnesia: Secondary | ICD-10-CM | POA: Diagnosis not present

## 2023-02-27 DIAGNOSIS — B351 Tinea unguium: Secondary | ICD-10-CM | POA: Diagnosis not present

## 2023-02-27 DIAGNOSIS — Z820 Family history of epilepsy and other diseases of the nervous system: Secondary | ICD-10-CM

## 2023-02-27 DIAGNOSIS — E785 Hyperlipidemia, unspecified: Secondary | ICD-10-CM

## 2023-02-27 DIAGNOSIS — Z0000 Encounter for general adult medical examination without abnormal findings: Secondary | ICD-10-CM

## 2023-02-27 DIAGNOSIS — T8189XD Other complications of procedures, not elsewhere classified, subsequent encounter: Secondary | ICD-10-CM | POA: Diagnosis not present

## 2023-02-27 DIAGNOSIS — T8189XA Other complications of procedures, not elsewhere classified, initial encounter: Secondary | ICD-10-CM

## 2023-02-27 DIAGNOSIS — N3000 Acute cystitis without hematuria: Secondary | ICD-10-CM | POA: Diagnosis not present

## 2023-02-27 LAB — URINALYSIS, ROUTINE W REFLEX MICROSCOPIC
Bilirubin, UA: NEGATIVE
Glucose, UA: NEGATIVE
Nitrite, UA: NEGATIVE
Specific Gravity, UA: 1.02 (ref 1.005–1.030)
Urobilinogen, Ur: 2 mg/dL — ABNORMAL HIGH (ref 0.2–1.0)
pH, UA: 5.5 (ref 5.0–7.5)

## 2023-02-27 LAB — MICROSCOPIC EXAMINATION

## 2023-02-27 LAB — BAYER DCA HB A1C WAIVED: HB A1C (BAYER DCA - WAIVED): 5.3 % (ref 4.8–5.6)

## 2023-02-27 MED ORDER — CICLOPIROX 8 % EX SOLN
Freq: Every day | CUTANEOUS | 0 refills | Status: DC
Start: 2023-02-27 — End: 2023-10-15

## 2023-02-27 MED ORDER — FLUCONAZOLE 150 MG PO TABS
150.0000 mg | ORAL_TABLET | Freq: Once | ORAL | 0 refills | Status: AC
Start: 2023-02-27 — End: 2023-02-27

## 2023-02-27 MED ORDER — ATORVASTATIN CALCIUM 20 MG PO TABS: 20.0000 mg | ORAL_TABLET | Freq: Every day | ORAL | 3 refills | Status: DC

## 2023-02-27 MED ORDER — CEPHALEXIN 500 MG PO CAPS
500.0000 mg | ORAL_CAPSULE | Freq: Two times a day (BID) | ORAL | 0 refills | Status: AC
Start: 2023-02-27 — End: 2023-03-06

## 2023-02-27 NOTE — Patient Instructions (Signed)

## 2023-02-27 NOTE — Progress Notes (Addendum)
Laurie Kelley is a 52 y.o. female presents to office today for annual physical exam examination.    Concerns today include: 1.  Wound Patient reports a wound along the right temple area that is postsurgical.  It has been really hard to heal this.  She notes that she was cleaning at the area early on and accidentally pulled the scab.  It has been really difficult to heal since then and she ended up having a soft tissue infection in this area at 1 point that required antibiotics.  Now has a really large scab and she worries about maybe being diabetic and this would explain why she is not healing well.  She has not followed up again with her surgeon.  She reports some reluctance to do so.  She also notes some memory changes that have been ongoing.  She has tested normally for Mini-Mental status exams in the past but given family history of Alzheimer's disease she was referred to a specialist for memory testing in December.  She also reports some irregular changes to the right great toenail.  Currently they are painted but she brings me a picture that shows a white, yellow line going along the right side of her great toenail.  She has not tried utilizing anything in this area yet  Reports increased urinary frequency and urgency over the last several days.  No hematuria, fevers reported.  Occupation: Not currently working, Substance use: None There are no preventive care reminders to display for this patient.  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   Cancer (HCC)    Chest pain in adult 07/31/2017   Family history of brain tumor    Family history of brain tumor 06/27/2021   Gallstones    Headache    Hyperlipidemia with target LDL less than 100 08/18/2015   Internal hemorrhoids 08/18/2014   Lynch syndrome    PONV (postoperative nausea and vomiting)    Social History   Socioeconomic History   Marital status: Divorced    Spouse name: Not on file   Number of children: 2   Years of education: Not on file   Highest education level: Associate degree: occupational, Scientist, product/process development, or vocational program  Occupational History   Not on file  Tobacco Use   Smoking status: Never   Smokeless tobacco: Never  Vaping Use   Vaping status: Never Used  Substance and Sexual Activity   Alcohol use: No   Drug use: No   Sexual activity: Not Currently    Birth control/protection: Injection  Other Topics Concern   Not on file  Social History Narrative   Not on file   Social Determinants of Health   Financial Resource Strain: Low Risk  (12/29/2022)   Received from [facility]   Overall Financial Resource Strain (CARDIA)    Difficulty of Paying Living Expenses: Not hard at all  Food Insecurity: No Food Insecurity (12/29/2022)   Received from [facility]   Hunger Vital Sign    Worried About Running Out of Food in the  Last Year: Never true    Ran Out of Food in the Last Year: Never true  Transportation Needs: No Transportation Needs (12/29/2022)   Received from [facility] - Transportation    Lack of Transportation (Medical): No    Lack of Transportation (Non-Medical): No  Physical Activity: Insufficiently Active (12/29/2022)   Received from [facility]   Exercise Vital Sign    Days of Exercise per Week: 2 days    Minutes of Exercise per Session: 10 min  Stress: Stress Concern Present (12/29/2022)   Received from [facility] -  Occupational Stress Questionnaire    Feeling of Stress : To some extent  Social Connections: Somewhat Isolated (12/29/2022)   Received from [facility]   Social Network    How would you rate your social network (family, work, friends)?: Restricted participation with some degree of social isolation  Intimate Partner Violence: Not At Risk (12/29/2022)   Received from [facility]   HITS    Over the last 12 months how often did your partner physically hurt you?: 1    Over the last 12 months how often did your partner insult you or talk down to you?: 1    Over the last 12 months how often did your partner threaten you with physical harm?: 1    Over the last 12 months how often did your partner scream or curse at you?: 1   Past Surgical History:  Procedure Laterality Date   ABDOMINAL HYSTERECTOMY N/A 03/08/2022   Procedure: HYSTERECTOMY ABDOMINAL;  Surgeon: Myna Hidalgo, DO;  Location: [facility];  Service: Gynecology;  Laterality: N/A;   ADJACENT TISSUE TRANSFER/TISSUE REARRANGEMENT Right 11/22/2022   Procedure: Fat grafting to right  temple from right buttocks after brain surgery;  Surgeon: Peggye Form, DO;  Location: [facility];  Service: Plastics;  Laterality: Right;   APPLICATION OF CRANIAL NAVIGATION Left 02/18/2021   Procedure: APPLICATION OF CRANIAL NAVIGATION;  Surgeon: Jadene Pierini, MD;  Location: [facility];  Service: Neurosurgery;  Laterality: Left;   APPLICATION OF CRANIAL NAVIGATION N/A 05/18/2021   Procedure: APPLICATION OF CRANIAL NAVIGATION;  Surgeon: Jadene Pierini, MD;  Location: [facility];  Service: Neurosurgery;  Laterality: N/A;   BIOPSY  06/28/2022   Procedure: BIOPSY;  Surgeon: Shellia Cleverly, DO;  Location: [facility];  Service: Gastroenterology;;   BRAIN SURGERY     CHOLECYSTECTOMY  1993   COLONOSCOPY WITH PROPOFOL N/A 06/28/2022   Procedure: COLONOSCOPY WITH PROPOFOL;  Surgeon: Shellia Cleverly, DO;  Location: [facility];  Service:  Gastroenterology;  Laterality: N/A;   CRANIOTOMY Left 02/18/2021   Procedure: Left orbitozygomatic craniotomy for tumor resection  with Brain Lab;  Surgeon: Jadene Pierini, MD;  Location: [facility];  Service: Neurosurgery;  Laterality: Left;   CRANIOTOMY Right 05/18/2021   Procedure: Right Orbitozygomatic craniotomy for tumor with brainlab;  Surgeon: Jadene Pierini, MD;  Location: [facility] OR;  Service: Neurosurgery;  Laterality: Right;   ESOPHAGOGASTRODUODENOSCOPY (EGD) WITH PROPOFOL N/A 06/28/2022   Procedure: ESOPHAGOGASTRODUODENOSCOPY (EGD) WITH PROPOFOL;  Surgeon: Shellia Cleverly, DO;  Location: [facility];  Service: Gastroenterology;  Laterality: N/A;   POLYPECTOMY  06/28/2022   Procedure: POLYPECTOMY;  Surgeon: Shellia Cleverly, DO;  Location: [facility];  Service: Gastroenterology;;   SALPINGOOPHORECTOMY Bilateral 03/08/2022   Procedure: OPEN SALPINGO OOPHORECTOMY;  Surgeon: Myna Hidalgo, DO;  Location: [facility];  Service: Gynecology;  Laterality: Bilateral;   SHOULDER  ARTHROSCOPY Right 11/06/2019   Procedure: ARTHROSCOPY RIGHT SHOULDER, DEBRIDEMENT X TWO STRUCTURES; OPEN ROTATOR CUFF REPAIR;  Surgeon: Vickki Hearing, MD;  Location: [facility];  Service: Orthopedics;  Laterality: Right;   Family History  Problem Relation Age of Onset   Cancer Mother    Hyperlipidemia Mother    Lung cancer Mother    Hypertension Father    Heart disease Father    Other Sister        meningiomas/tumors x11   Other Daughter        Lynch syndrome   Brain cancer Maternal Aunt        d. 87s-60s   Lung cancer Maternal Aunt    Bone cancer Maternal Aunt        d. 50s-60s   Liver cancer Paternal Uncle    Lung cancer Paternal Grandmother        d.85   Rectal cancer Neg Hx    Esophageal cancer Neg Hx    Stomach cancer Neg Hx    Breast cancer Neg Hx    Colon polyps Neg Hx    Colon cancer Neg Hx     Current Outpatient Medications:    BIOTIN PO, Take 1 tablet by mouth daily., Disp: , Rfl:     Cholecalciferol (DIALYVITE VITAMIN D 5000) 125 MCG (5000 UT) capsule, Take 5,000 Units by mouth daily., Disp: , Rfl:    ciclopirox (PENLAC) 8 % solution, Apply topically at bedtime. Apply over nail and surrounding skin. Apply daily over previous coat. After seven (7) days, may remove with alcohol and continue cycle., Disp: 6.6 mL, Rfl: 0   gabapentin (NEURONTIN) 600 MG tablet, Take 600 mg by mouth 3 (three) times daily., Disp: , Rfl:    hydrocortisone 1 % ointment, Apply 1 Application topically 2 (two) times daily as needed for itching., Disp: , Rfl:    atorvastatin (LIPITOR) 20 MG tablet, Take 1 tablet (20 mg total) by mouth daily., Disp: 90 tablet, Rfl: 3  Allergies  Allergen Reactions   Gadolinium Derivatives Other (See Comments)    Pt reports she developed chest tightness, burning of her back, and weakness after MRI contrast 11/2021. Per Dr. Alfredo Batty, he suggests 13 hr protocol prior to MRI.    Saline Nausea And Vomiting   Sodium Chloride Nausea And Vomiting     ROS: Review of Systems Pertinent items noted in HPI and remainder of comprehensive ROS otherwise negative.    Physical exam BP 120/69   Pulse 69   Temp 98.5 F (36.9 C)   Ht 5\' 5"  (1.651 m)   Wt 183 lb 9.6 oz (83.3 kg)   SpO2 100%   BMI 30.55 kg/m  General appearance: alert, cooperative, appears stated age, and no distress Head:  Large scab noted along the right temporal area.  See photos below Eyes: negative findings: lids and lashes normal Ears: normal TM's and external ear canals both ears.  She has a tympanostomy tube on the right that is in place in the tympanic membrane Nose: Nares normal. Septum midline. Mucosa normal. No drainage or sinus tenderness. Throat: lips, mucosa, and tongue normal; teeth and gums normal Neck: no adenopathy, no carotid bruit, no JVD, supple, symmetrical, trachea midline, and thyroid not enlarged, symmetric, no tenderness/mass/nodules Back: symmetric, no curvature. ROM normal. No CVA  tenderness. Lungs: clear to auscultation bilaterally Heart: regular rate and rhythm, S1, S2 normal, no murmur, click, rub or gallop Abdomen: soft, non-tender; bowel sounds normal; no masses,  no organomegaly Extremities:  extremities normal, atraumatic, no cyanosis or edema Pulses: 2+ and symmetric Skin:  As above.  Otherwise no lesions Lymph nodes: Cervical, supraclavicular, and axillary nodes normal. Neurologic: Grossly normal      02/27/2023    8:19 AM 08/02/2020   10:40 AM  MMSE - Mini Mental State Exam  Orientation to time 5 5  Orientation to Place 5 5  Registration 3 3  Attention/ Calculation 5 5  Recall 3 3  Language- name 2 objects 2 2  Language- repeat 1 1  Language- follow 3 step command 3 3  Language- read & follow direction 1 1  Write a sentence 1 1  Copy design 1 1  Total score 30 30       02/27/2023    8:16 AM 02/02/2023    3:58 PM 10/10/2022   10:21 AM  Depression screen PHQ 2/9  Decreased Interest 0 1 0  Down, Depressed, Hopeless 0 0 0  PHQ - 2 Score 0 1 0  Altered sleeping 1 0 1  Tired, decreased energy 2 2 2   Change in appetite 0 0 0  Feeling bad or failure about yourself  0 0 0  Trouble concentrating 1 0 2  Moving slowly or fidgety/restless 1 0 0  Suicidal thoughts 0 0 0  PHQ-9 Score 5 3 5   Difficult doing work/chores Extremely dIfficult Extremely dIfficult Somewhat difficult      02/27/2023    8:16 AM 02/02/2023    3:58 PM 10/10/2022   10:22 AM 09/19/2022    2:04 PM  GAD 7 : Generalized Anxiety Score  Nervous, Anxious, on Edge 1 1 1  0  Control/stop worrying 1 1 1 3   Worry too much - different things 1 1 1 3   Trouble relaxing 1 1 1 1   Restless 0 0 0 0  Easily annoyed or irritable 1 1 0 1  Afraid - awful might happen 3 1 3 3   Total GAD 7 Score 8 6 7 11   Anxiety Difficulty Extremely difficult Extremely difficult Somewhat difficult Very difficult           Assessment/ Plan: Drucilla Gwenlyn Perking here for annual physical exam.   Annual  physical exam  Memory changes - Plan: Bayer DCA Hb A1c Waived, Vitamin B12  Family history of Alzheimer's disease  Suspected UTI - Plan: Urinalysis, Routine w reflex microscopic, Urine Culture  Hyperlipidemia with target LDL less than 100 - Plan: Lipid panel, atorvastatin (LIPITOR) 20 MG tablet  Delayed surgical wound healing, initial encounter - Plan: Bayer DCA Hb A1c Waived, Bayer DCA Hb A1c Waived  Onychomycosis of great toe - Plan: ciclopirox (PENLAC) 8 % solution  Check B12.  MMSE was totally normal.  A1c today.  She has an appointment with neurology for memory testing in December  Urinalysis collected.  Treatment pending results  Fasting lipid collected.  Continue statin  Having some delayed surgical wound healing.  I have reached out to her surgeon to see if there are any recommendations.  Will plan for referral to wound care if recommended  Penlac solution ordered for onychomycosis noted on the great toe.  Discussed appropriate expectations including timing of resolution etc.  Counseled on healthy lifestyle choices, including diet (rich in fruits, vegetables and lean meats and low in salt and simple carbohydrates) and exercise (at least 30 minutes of moderate physical activity daily).  Patient to follow up 6 months for general follow-up  Devory Mckinzie M. Nadine Counts, DO

## 2023-02-28 LAB — LIPID PANEL
Chol/HDL Ratio: 3.6 ratio (ref 0.0–4.4)
Cholesterol, Total: 142 mg/dL (ref 100–199)
HDL: 40 mg/dL (ref >39)
LDL Chol Calc (NIH): 82 mg/dL (ref 0–99)
Triglycerides: 109 mg/dL (ref 0–149)
VLDL Cholesterol Cal: 20 mg/dL (ref 5–40)

## 2023-02-28 LAB — URINE CULTURE

## 2023-02-28 LAB — VITAMIN B12: Vitamin B-12: 647 pg/mL (ref 232–1245)

## 2023-03-02 ENCOUNTER — Other Ambulatory Visit: Payer: Self-pay

## 2023-03-02 ENCOUNTER — Other Ambulatory Visit: Payer: Commercial Managed Care - PPO

## 2023-03-02 DIAGNOSIS — N3000 Acute cystitis without hematuria: Secondary | ICD-10-CM

## 2023-03-02 LAB — URINALYSIS, ROUTINE W REFLEX MICROSCOPIC
Bilirubin, UA: NEGATIVE
Glucose, UA: NEGATIVE
Ketones, UA: NEGATIVE
Nitrite, UA: NEGATIVE
Protein,UA: NEGATIVE
Specific Gravity, UA: <1.005 — ABNORMAL LOW (ref 1.005–1.030)
Urobilinogen, Ur: 0.2 mg/dL (ref 0.2–1.0)
pH, UA: 6 (ref 5.0–7.5)

## 2023-03-02 LAB — MICROSCOPIC EXAMINATION
Mucus, UA: NONE SEEN
Renal Epithel, UA: NONE SEEN /HPF
Yeast, UA: NONE SEEN

## 2023-03-04 LAB — URINE CULTURE: Organism ID, Bacteria: NO GROWTH

## 2023-03-16 ENCOUNTER — Other Ambulatory Visit: Payer: Self-pay | Admitting: Surgery

## 2023-03-16 DIAGNOSIS — E0590 Thyrotoxicosis, unspecified without thyrotoxic crisis or storm: Secondary | ICD-10-CM

## 2023-03-20 ENCOUNTER — Other Ambulatory Visit (HOSPITAL_COMMUNITY): Payer: Self-pay | Admitting: Surgery

## 2023-03-20 DIAGNOSIS — E0590 Thyrotoxicosis, unspecified without thyrotoxic crisis or storm: Secondary | ICD-10-CM

## 2023-03-22 ENCOUNTER — Encounter: Payer: Self-pay | Admitting: Family Medicine

## 2023-03-22 ENCOUNTER — Other Ambulatory Visit: Payer: Commercial Managed Care - PPO

## 2023-03-22 DIAGNOSIS — N760 Acute vaginitis: Secondary | ICD-10-CM

## 2023-03-23 ENCOUNTER — Encounter: Payer: Self-pay | Admitting: Family Medicine

## 2023-03-23 ENCOUNTER — Ambulatory Visit (INDEPENDENT_AMBULATORY_CARE_PROVIDER_SITE_OTHER): Payer: Commercial Managed Care - PPO | Admitting: Family Medicine

## 2023-03-23 VITALS — BP 126/82 | HR 93 | Temp 98.0°F | Ht 65.0 in | Wt 181.6 lb

## 2023-03-23 DIAGNOSIS — B3731 Acute candidiasis of vulva and vagina: Secondary | ICD-10-CM | POA: Diagnosis not present

## 2023-03-23 DIAGNOSIS — N898 Other specified noninflammatory disorders of vagina: Secondary | ICD-10-CM

## 2023-03-23 LAB — MICROSCOPIC EXAMINATION
Cast Type: NONE SEEN
Casts: NONE SEEN /[LPF]
RBC, Urine: NONE SEEN /[HPF] (ref 0–2)
Renal Epithel, UA: NONE SEEN /[HPF]
Yeast, UA: NONE SEEN

## 2023-03-23 LAB — URINALYSIS, ROUTINE W REFLEX MICROSCOPIC
Bilirubin, UA: NEGATIVE
Glucose, UA: NEGATIVE
Ketones, UA: NEGATIVE
Nitrite, UA: NEGATIVE
Protein,UA: NEGATIVE
RBC, UA: NEGATIVE
Specific Gravity, UA: 1.025 (ref 1.005–1.030)
Urobilinogen, Ur: 1 mg/dL (ref 0.2–1.0)
pH, UA: 6 (ref 5.0–7.5)

## 2023-03-23 LAB — WET PREP FOR TRICH, YEAST, CLUE
Clue Cell Exam: NEGATIVE
Trichomonas Exam: NEGATIVE
Yeast Exam: NEGATIVE

## 2023-03-23 MED ORDER — FLUCONAZOLE 150 MG PO TABS
150.0000 mg | ORAL_TABLET | Freq: Once | ORAL | 0 refills | Status: AC
Start: 2023-03-23 — End: 2023-03-23

## 2023-03-23 MED ORDER — FLUCONAZOLE 150 MG PO TABS: 150.0000 mg | ORAL_TABLET | Freq: Once | ORAL | 0 refills | Status: DC

## 2023-03-23 NOTE — Progress Notes (Signed)
Subjective:  Patient ID: Laurie Kelley, female    DOB: April 10, 1971, 52 y.o.   MRN: [id number]  Patient Care Team: Raliegh Ip, DO as PCP - General (Family Medicine)   Chief Complaint:  vaginal irritation  (X 5 days )   HPI: Laurie Kelley is a 52 y.o. female presenting on 03/23/2023 for vaginal irritation  (X 5 days )   Vaginal Itching The patient's primary symptoms include genital itching. The patient's pertinent negatives include no genital lesions, genital odor, genital rash, missed menses, pelvic pain, vaginal bleeding or vaginal discharge. This is a new problem. The current episode started in the past 7 days. The problem occurs constantly. The problem has been waxing and waning. The pain is mild. Pertinent negatives include no abdominal pain, anorexia, back pain, chills, constipation, diarrhea, discolored urine, dysuria, fever, flank pain, frequency, headaches, hematuria, joint pain, joint swelling, nausea, painful intercourse, rash, sore throat, urgency or vomiting. Nothing aggravates the symptoms. Treatments tried: yogurt. The treatment provided no relief. She is not sexually active. She uses abstinence for contraception.     Relevant past medical, surgical, family, and social history reviewed and updated as indicated.  Allergies and medications reviewed and updated. Data reviewed: Chart in Epic.   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   Cancer (HCC)    Chest pain in adult 07/31/2017   Family history of brain tumor    Family history of brain tumor 06/27/2021   Gallstones    Headache    Hyperlipidemia with target LDL less than 100 08/18/2015   Internal hemorrhoids 08/18/2014   Lynch syndrome    PONV (postoperative nausea and vomiting)     Past Surgical History:  Procedure Laterality Date   ABDOMINAL HYSTERECTOMY N/A 03/08/2022   Procedure:  HYSTERECTOMY ABDOMINAL;  Surgeon: Myna Hidalgo, DO;  Location: [facility];  Service: Gynecology;  Laterality: N/A;   ADJACENT TISSUE TRANSFER/TISSUE REARRANGEMENT Right 11/22/2022   Procedure: Fat grafting to right  temple from right buttocks after brain surgery;  Surgeon: Peggye Form, DO;  Location: [facility];  Service: Plastics;  Laterality: Right;   APPLICATION OF CRANIAL NAVIGATION Left 02/18/2021   Procedure: APPLICATION OF CRANIAL NAVIGATION;  Surgeon: Jadene Pierini, MD;  Location: [facility];  Service: Neurosurgery;  Laterality: Left;   APPLICATION OF CRANIAL NAVIGATION N/A 05/18/2021   Procedure: APPLICATION OF CRANIAL NAVIGATION;  Surgeon: Jadene Pierini, MD;  Location: [facility];  Service: Neurosurgery;  Laterality: N/A;   BIOPSY  06/28/2022   Procedure: BIOPSY;  Surgeon: Shellia Cleverly, DO;  Location: [facility];  Service: Gastroenterology;;   BRAIN SURGERY     CHOLECYSTECTOMY  1993   COLONOSCOPY WITH PROPOFOL N/A 06/28/2022   Procedure: COLONOSCOPY WITH PROPOFOL;  Surgeon: Shellia Cleverly, DO;  Location: [facility];  Service: Gastroenterology;  Laterality: N/A;   CRANIOTOMY Left 02/18/2021   Procedure: Left orbitozygomatic craniotomy for tumor resection  with Brain Lab;  Surgeon: Jadene Pierini, MD;  Location: [facility];  Service: Neurosurgery;  Laterality: Left;   CRANIOTOMY Right 05/18/2021   Procedure: Right Orbitozygomatic craniotomy for tumor with brainlab;  Surgeon: Jadene Pierini, MD;  Location: [facility] OR;  Service: Neurosurgery;  Laterality: Right;   ESOPHAGOGASTRODUODENOSCOPY (EGD) WITH PROPOFOL N/A 06/28/2022   Procedure: ESOPHAGOGASTRODUODENOSCOPY (EGD) WITH PROPOFOL;  Surgeon: Shellia Cleverly, DO;  Location: [facility];  Service: Gastroenterology;  Laterality: N/A;   POLYPECTOMY  06/28/2022   Procedure: POLYPECTOMY;  Surgeon: Shellia Cleverly, DO;  Location: [facility];  Service: Gastroenterology;;   SALPINGOOPHORECTOMY Bilateral  03/08/2022   Procedure: OPEN SALPINGO OOPHORECTOMY;  Surgeon: Myna Hidalgo, DO;  Location: [facility];  Service: Gynecology;  Laterality: Bilateral;   SHOULDER ARTHROSCOPY Right 11/06/2019   Procedure: ARTHROSCOPY RIGHT SHOULDER, DEBRIDEMENT X TWO STRUCTURES; OPEN ROTATOR CUFF REPAIR;  Surgeon: Vickki Hearing, MD;  Location: [facility];  Service: Orthopedics;  Laterality: Right;    Social History   Socioeconomic History   Marital status: Divorced    Spouse name: Not on file   Number of children: 2   Years of education: Not on file   Highest education level: Associate degree: occupational, Scientist, product/process development, or vocational program  Occupational History   Not on file  Tobacco Use   Smoking status: Never   Smokeless tobacco: Never  Vaping Use   Vaping status: Never Used  Substance and Sexual Activity   Alcohol use: No   Drug use: No   Sexual activity: Not Currently    Birth control/protection: Injection  Other Topics Concern   Not on file  Social History Narrative   Not on file   Social Determinants of Health   Financial Resource Strain: Low Risk  (12/29/2022)   Received from [facility]   Overall Financial Resource Strain (CARDIA)    Difficulty of Paying Living Expenses: Not hard at all  Food Insecurity: No Food Insecurity (12/29/2022)   Received from [facility]   Hunger Vital Sign    Worried About Running Out of Food in the Last Year: Never true    Ran Out of Food in the Last Year: Never true  Transportation Needs: No Transportation Needs (12/29/2022)   Received from [facility] - Transportation    Lack of Transportation (Medical): No    Lack of Transportation (Non-Medical): No  Physical Activity: Insufficiently Active (12/29/2022)   Received from [facility]   Exercise Vital Sign    Days of Exercise per Week: 2 days    Minutes of Exercise per Session: 10 min  Stress: Stress Concern Present (12/29/2022)   Received from [facility] - Occupational Stress Questionnaire    Feeling of Stress : To some extent  Social Connections: Somewhat Isolated (12/29/2022)   Received from [facility]   Social Network    How would you rate your social network (family, work, friends)?: Restricted participation with some degree of social isolation  Intimate Partner Violence: Not At Risk (12/29/2022)   Received from [facility]   HITS    Over the last 12 months how often did your partner physically hurt you?: 1    Over the last 12 months how often did your partner insult you or talk down to you?: 1    Over the last 12 months how often did your partner threaten you with physical harm?: 1    Over the last 12 months how often did your partner scream or curse at you?: 1    Outpatient Encounter Medications as of 03/23/2023  Medication Sig   atorvastatin (LIPITOR) 20 MG tablet Take 1 tablet (20 mg total) by mouth daily.   BIOTIN PO Take 1 tablet by mouth daily.   ciclopirox (PENLAC) 8 % solution Apply topically at bedtime. Apply over nail and surrounding skin. Apply daily over previous coat. After seven (  7) days, may remove with alcohol and continue cycle.   gabapentin (NEURONTIN) 600 MG tablet Take 600 mg by mouth 3 (three) times daily.   hydrocortisone 1 % ointment Apply 1 Application topically 2 (two) times daily as needed for itching.   [DISCONTINUED] fluconazole (DIFLUCAN) 150 MG tablet Take 1 tablet (150 mg total) by mouth once for 1 dose.   Cholecalciferol (DIALYVITE VITAMIN D 5000) 125 MCG (5000 UT) capsule Take 5,000 Units by mouth daily. (Patient not taking: Reported on 03/23/2023)   fluconazole (DIFLUCAN) 150 MG tablet Take 1 tablet (150 mg total) by mouth once for 1 dose.   No facility-administered encounter medications on file as of 03/23/2023.    Allergies  Allergen Reactions   Gadolinium Derivatives Other (See Comments)    Pt reports she developed chest tightness, burning of her back, and weakness after  MRI contrast 11/2021. Per Dr. Alfredo Batty, he suggests 13 hr protocol prior to MRI.    Saline Nausea And Vomiting   Sodium Chloride Nausea And Vomiting    Review of Systems  Constitutional:  Negative for chills and fever.  HENT:  Negative for sore throat.   Gastrointestinal:  Negative for abdominal pain, anorexia, constipation, diarrhea, nausea and vomiting.  Genitourinary:  Negative for dysuria, flank pain, frequency, hematuria, missed menses, pelvic pain, urgency and vaginal discharge.  Musculoskeletal:  Negative for back pain and joint pain.  Skin:  Negative for rash.  Neurological:  Negative for headaches.  All other systems reviewed and are negative.       Objective:  BP 126/82   Pulse 93   Temp 98 F (36.7 C) (Temporal)   Ht 5\' 5"  (1.651 m)   Wt 181 lb 9.6 oz (82.4 kg)   SpO2 98%   BMI 30.22 kg/m    Wt Readings from Last 3 Encounters:  03/23/23 181 lb 9.6 oz (82.4 kg)  02/27/23 183 lb 9.6 oz (83.3 kg)  02/12/23 185 lb (83.9 kg)    Physical Exam Constitutional:      Appearance: Normal appearance.  HENT:     Head: Normocephalic and atraumatic.     Nose: Nose normal.     Mouth/Throat:     Mouth: Mucous membranes are moist.     Pharynx: Oropharynx is clear.  Eyes:     Conjunctiva/sclera: Conjunctivae normal.     Pupils: Pupils are equal, round, and reactive to light.  Cardiovascular:     Rate and Rhythm: Normal rate and regular rhythm.     Heart sounds: Normal heart sounds.  Pulmonary:     Effort: Pulmonary effort is normal.     Breath sounds: Normal breath sounds.  Abdominal:     General: Bowel sounds are normal.     Palpations: Abdomen is soft.  Genitourinary:    Comments: Did self wet prep Skin:    General: Skin is warm and dry.     Capillary Refill: Capillary refill takes less than 2 seconds.     Comments: Scar to right temporal area healing well  Neurological:     General: No focal deficit present.     Mental Status: She is alert and oriented to  person, place, and time.  Psychiatric:        Mood and Affect: Mood normal.        Behavior: Behavior normal.        Thought Content: Thought content normal.        Judgment: Judgment normal.     Results for orders  placed or performed in visit on 03/02/23  Urine Culture   Specimen: Urine   UR  Result Value Ref Range   Urine Culture, Routine Final report    Organism ID, Bacteria No growth   Microscopic Examination   Urine  Result Value Ref Range   WBC, UA 0-5 0 - 5 /hpf   RBC, Urine 0-2 0 - 2 /hpf   Epithelial Cells (non renal) 0-10 0 - 10 /hpf   Renal Epithel, UA None seen None seen /hpf   Crystals Present (A) N/A   Crystal Type Triple Phosphate N/A   Mucus, UA None seen Not Estab.   Bacteria, UA Few (A) None seen/Few   Yeast, UA None seen None seen  Urinalysis, Routine w reflex microscopic  Result Value Ref Range   Specific Gravity, UA <1.005 (L) 1.005 - 1.030   pH, UA 6.0 5.0 - 7.5   Color, UA Yellow Yellow   Appearance Ur Clear Clear   Leukocytes,UA Trace (A) Negative   Protein,UA Negative Negative/Trace   Glucose, UA Negative Negative   Ketones, UA Negative Negative   RBC, UA Trace (A) Negative   Bilirubin, UA Negative Negative   Urobilinogen, Ur 0.2 0.2 - 1.0 mg/dL   Nitrite, UA Negative Negative   Microscopic Examination See below:        Pertinent labs & imaging results that were available during my care of the patient were reviewed by me and considered in my medical decision making.  Assessment & Plan:  Larisha was seen today for vaginal irritation .  Diagnoses and all orders for this visit:  Vaginal irritation Yeast vaginitis  Will treat with below. Pt aware to follow up for persistent symptoms.  -     WET PREP FOR TRICH, YEAST, CLUE -     Urinalysis, Routine w reflex microscopic -     fluconazole (DIFLUCAN) 150 MG tablet; Take 1 tablet (150 mg total) by mouth once for 1 dose.     Continue all other maintenance medications.  Follow up  plan: Return if symptoms worsen or fail to improve.   Continue healthy lifestyle choices, including diet (rich in fruits, vegetables, and lean proteins, and low in salt and simple carbohydrates) and exercise (at least 30 minutes of moderate physical activity daily).  Educational handout given for vaginal yeast  The above assessment and management plan was discussed with the patient. The patient verbalized understanding of and has agreed to the management plan. Patient is aware to call the clinic if they develop any new symptoms or if symptoms persist or worsen. Patient is aware when to return to the clinic for a follow-up visit. Patient educated on when it is appropriate to go to the emergency department.   Kari Baars, FNP-C [facility] [phone]

## 2023-03-26 ENCOUNTER — Other Ambulatory Visit: Payer: Commercial Managed Care - PPO

## 2023-03-26 ENCOUNTER — Other Ambulatory Visit: Payer: Self-pay | Admitting: Family Medicine

## 2023-03-26 MED ORDER — FLUCONAZOLE 150 MG PO TABS: 150.0000 mg | ORAL_TABLET | Freq: Once | ORAL | 0 refills | Status: AC

## 2023-03-26 NOTE — Addendum Note (Signed)
Addended by: Raliegh Ip on: 03/26/2023 07:31 AM   Modules accepted: Orders

## 2023-03-28 ENCOUNTER — Encounter (HOSPITAL_COMMUNITY)
Admission: RE | Admit: 2023-03-28 | Discharge: 2023-03-28 | Disposition: A | Discharge status: Home / Self Care | Payer: Commercial Managed Care - PPO | Source: Ambulatory Visit | Attending: Surgery | Admitting: Surgery

## 2023-03-28 ENCOUNTER — Ambulatory Visit (HOSPITAL_COMMUNITY)
Admission: RE | Admit: 2023-03-28 | Discharge: 2023-03-28 | Disposition: A | Discharge status: Home / Self Care | Payer: Commercial Managed Care - PPO | Source: Ambulatory Visit | Attending: Surgery | Admitting: Surgery

## 2023-03-28 DIAGNOSIS — E0590 Thyrotoxicosis, unspecified without thyrotoxic crisis or storm: Secondary | ICD-10-CM

## 2023-03-28 MED ORDER — TECHNETIUM TC 99M SESTAMIBI - CARDIOLITE
25.0000 | Freq: Once | INTRAVENOUS | Status: AC | PRN
  Administered 2023-03-28: 23.6 via INTRAVENOUS

## 2023-03-29 ENCOUNTER — Encounter: Payer: Self-pay | Admitting: Family Medicine

## 2023-03-29 ENCOUNTER — Ambulatory Visit (INDEPENDENT_AMBULATORY_CARE_PROVIDER_SITE_OTHER): Payer: Commercial Managed Care - PPO | Admitting: Family Medicine

## 2023-03-29 VITALS — BP 127/75 | HR 87 | Temp 98.5°F | Ht 65.0 in | Wt 180.0 lb

## 2023-03-29 DIAGNOSIS — N898 Other specified noninflammatory disorders of vagina: Secondary | ICD-10-CM

## 2023-03-29 DIAGNOSIS — R300 Dysuria: Secondary | ICD-10-CM | POA: Diagnosis not present

## 2023-03-29 LAB — MICROSCOPIC EXAMINATION
Renal Epithel, UA: NONE SEEN /[HPF]
Yeast, UA: NONE SEEN

## 2023-03-29 LAB — WET PREP FOR TRICH, YEAST, CLUE
Clue Cell Exam: NEGATIVE
Trichomonas Exam: NEGATIVE
Yeast Exam: NEGATIVE

## 2023-03-29 LAB — URINALYSIS, ROUTINE W REFLEX MICROSCOPIC
Bilirubin, UA: NEGATIVE
Glucose, UA: NEGATIVE
Ketones, UA: NEGATIVE
Nitrite, UA: NEGATIVE
Specific Gravity, UA: >1.03 — ABNORMAL HIGH (ref 1.005–1.030)
Urobilinogen, Ur: 1 mg/dL (ref 0.2–1.0)
pH, UA: 5.5 (ref 5.0–7.5)

## 2023-03-29 MED ORDER — NITROFURANTOIN MONOHYD MACRO 100 MG PO CAPS
100.0000 mg | ORAL_CAPSULE | Freq: Two times a day (BID) | ORAL | 0 refills | Status: AC
Start: 2023-03-29 — End: 2023-04-03

## 2023-03-29 MED ORDER — FLUCONAZOLE 150 MG PO TABS
ORAL_TABLET | ORAL | 0 refills | Status: DC
Start: 2023-03-29 — End: 2023-04-11

## 2023-03-29 NOTE — Patient Instructions (Addendum)
Rephresh OTC  Olly pre and probiotic

## 2023-03-29 NOTE — Progress Notes (Signed)
Subjective:  Patient ID: Laurie Kelley, female    DOB: December 05, 1970, 52 y.o.   MRN: [id number]  Patient Care Team: Raliegh Ip, DO as PCP - General (Family Medicine)   Chief Complaint:  Vaginitis   HPI: Laurie Kelley is a 52 y.o. female presenting on 03/29/2023 for Vaginitis  HPI 1. Dysuria States that on 9/10 she was treated for yeast infection, it cleared. She was then seen again this past Friday for similar symptoms and continued diflucan. Completed 3 doses. She reports symptoms now of  mixed of urgency and hesitancy, increased frequency. States that there is spot that she feels burning on occasion. Endorses some bleeding on the toilet paper.  Endorses changes to her urine consistency.  States that she was told she had herpes years ago and she did not have any breakouts. States that she has not had any breakouts.    Relevant past medical, surgical, family, and social history reviewed and updated as indicated.  Allergies and medications reviewed and updated. Data reviewed: Chart in Epic.   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   Cancer (HCC)    Chest pain in adult 07/31/2017   Family history of brain tumor    Family history of brain tumor 06/27/2021   Gallstones    Headache    Hyperlipidemia with target LDL less than 100 08/18/2015   Internal hemorrhoids 08/18/2014   Lynch syndrome    PONV (postoperative nausea and vomiting)     Past Surgical History:  Procedure Laterality Date   ABDOMINAL HYSTERECTOMY N/A 03/08/2022   Procedure: HYSTERECTOMY ABDOMINAL;  Surgeon: Myna Hidalgo, DO;  Location: [facility];  Service: Gynecology;  Laterality: N/A;   ADJACENT TISSUE TRANSFER/TISSUE REARRANGEMENT Right 11/22/2022   Procedure: Fat grafting to right  temple from right buttocks after brain surgery;  Surgeon: Peggye Form, DO;  Location: [facility];  Service: Plastics;  Laterality: Right;   APPLICATION OF CRANIAL NAVIGATION Left 02/18/2021   Procedure: APPLICATION OF CRANIAL NAVIGATION;  Surgeon: Jadene Pierini, MD;  Location: [facility];  Service: Neurosurgery;  Laterality: Left;   APPLICATION OF CRANIAL NAVIGATION N/A 05/18/2021   Procedure: APPLICATION OF CRANIAL NAVIGATION;  Surgeon: Jadene Pierini, MD;  Location: [facility];  Service: Neurosurgery;  Laterality: N/A;   BIOPSY  06/28/2022   Procedure: BIOPSY;  Surgeon: Shellia Cleverly, DO;  Location: [facility];  Service: Gastroenterology;;   BRAIN SURGERY     CHOLECYSTECTOMY  1993   COLONOSCOPY WITH PROPOFOL N/A 06/28/2022   Procedure: COLONOSCOPY WITH PROPOFOL;  Surgeon: Shellia Cleverly, DO;  Location: [facility];  Service: Gastroenterology;  Laterality: N/A;   CRANIOTOMY Left 02/18/2021   Procedure: Left orbitozygomatic craniotomy for tumor resection  with Brain Lab;  Surgeon: Jadene Pierini, MD;  Location: [facility];  Service: Neurosurgery;  Laterality: Left;   CRANIOTOMY Right 05/18/2021   Procedure: Right Orbitozygomatic craniotomy for tumor with brainlab;  Surgeon: Jadene Pierini, MD;  Location: [facility] OR;  Service: Neurosurgery;  Laterality: Right;   ESOPHAGOGASTRODUODENOSCOPY (EGD) WITH PROPOFOL N/A 06/28/2022   Procedure: ESOPHAGOGASTRODUODENOSCOPY (EGD) WITH PROPOFOL;  Surgeon: Shellia Cleverly, DO;  Location: [facility];  Service: Gastroenterology;  Laterality: N/A;   POLYPECTOMY  06/28/2022   Procedure: POLYPECTOMY;  Surgeon: Shellia Cleverly, DO;  Location: [facility];  Service: Gastroenterology;;  SALPINGOOPHORECTOMY Bilateral 03/08/2022   Procedure: OPEN SALPINGO OOPHORECTOMY;  Surgeon: Myna Hidalgo, DO;  Location: [facility];  Service: Gynecology;  Laterality: Bilateral;   SHOULDER ARTHROSCOPY Right 11/06/2019   Procedure: ARTHROSCOPY RIGHT SHOULDER, DEBRIDEMENT X TWO STRUCTURES; OPEN ROTATOR CUFF REPAIR;  Surgeon: Vickki Hearing, MD;   Location: [facility];  Service: Orthopedics;  Laterality: Right;    Social History   Socioeconomic History   Marital status: Divorced    Spouse name: Not on file   Number of children: 2   Years of education: Not on file   Highest education level: Associate degree: occupational, Scientist, product/process development, or vocational program  Occupational History   Not on file  Tobacco Use   Smoking status: Never   Smokeless tobacco: Never  Vaping Use   Vaping status: Never Used  Substance and Sexual Activity   Alcohol use: No   Drug use: No   Sexual activity: Not Currently    Birth control/protection: Injection  Other Topics Concern   Not on file  Social History Narrative   Not on file   Social Determinants of Health   Financial Resource Strain: Low Risk  (12/29/2022)   Received from [facility]   Overall Financial Resource Strain (CARDIA)    Difficulty of Paying Living Expenses: Not hard at all  Food Insecurity: No Food Insecurity (12/29/2022)   Received from [facility]   Hunger Vital Sign    Worried About Running Out of Food in the Last Year: Never true    Ran Out of Food in the Last Year: Never true  Transportation Needs: No Transportation Needs (12/29/2022)   Received [facility] - Transportation    Lack of Transportation (Medical): No    Lack of Transportation (Non-Medical): No  Physical Activity: Insufficiently Active (12/29/2022)   Received from [facility]   Exercise Vital Sign    Days of Exercise per Week: 2 days    Minutes of Exercise per Session: 10 min  Stress: Stress Concern Present (12/29/2022)   Received from [facility] - Occupational Stress Questionnaire    Feeling of Stress : To some extent  Social Connections: Somewhat Isolated (12/29/2022)   Received from [facility]   Social Network    How would you rate your social network (family, work, friends)?: Restricted participation with some degree of social isolation   Intimate Partner Violence: Not At Risk (12/29/2022)   Received from [facility]   HITS    Over the last 12 months how often did your partner physically hurt you?: 1    Over the last 12 months how often did your partner insult you or talk down to you?: 1    Over the last 12 months how often did your partner threaten you with physical harm?: 1    Over the last 12 months how often did your partner scream or curse at you?: 1    Outpatient Encounter Medications as of 03/29/2023  Medication Sig   atorvastatin (LIPITOR) 20 MG tablet Take 1 tablet (20 mg total) by mouth daily.   BIOTIN PO Take 1 tablet by mouth daily.   Cholecalciferol (DIALYVITE VITAMIN D 5000) 125 MCG (5000 UT) capsule Take 5,000 Units by mouth daily.   ciclopirox (PENLAC) 8 % solution Apply topically at bedtime. Apply over nail and surrounding skin. Apply daily over previous coat. After seven (7) days, may remove with alcohol and continue cycle.   gabapentin (NEURONTIN) 600 MG  tablet Take 600 mg by mouth 3 (three) times daily.   hydrocortisone 1 % ointment Apply 1 Application topically 2 (two) times daily as needed for itching.   Probiotic Product (PROBIOTIC DAILY PO) Take by mouth.   No facility-administered encounter medications on file as of 03/29/2023.    Allergies  Allergen Reactions   Gadolinium Derivatives Other (See Comments)    Pt reports she developed chest tightness, burning of her back, and weakness after MRI contrast 11/2021. Per Dr. Alfredo Batty, he suggests 13 hr protocol prior to MRI.    Saline Nausea And Vomiting   Sodium Chloride Nausea And Vomiting    Review of Systems As per HPI  Objective:  BP 127/75   Pulse 87   Temp 98.5 F (36.9 C)   Ht 5\' 5"  (1.651 m)   Wt 180 lb (81.6 kg)   SpO2 100%   BMI 29.95 kg/m    Wt Readings from Last 3 Encounters:  03/29/23 180 lb (81.6 kg)  03/23/23 181 lb 9.6 oz (82.4 kg)  02/27/23 183 lb 9.6 oz (83.3 kg)   Physical Exam Chaperone present: declined  chaperone.  Constitutional:      General: She is awake. She is not in acute distress.    Appearance: Normal appearance. She is well-developed and well-groomed. She is not ill-appearing, toxic-appearing or diaphoretic.  Cardiovascular:     Rate and Rhythm: Normal rate.     Pulses: Normal pulses.          Radial pulses are 2+ on the right side and 2+ on the left side.       Posterior tibial pulses are 2+ on the right side and 2+ on the left side.     Heart sounds: Normal heart sounds. No murmur heard.    No gallop.  Pulmonary:     Effort: Pulmonary effort is normal. No respiratory distress.     Breath sounds: Normal breath sounds. No stridor. No wheezing, rhonchi or rales.  Abdominal:     Hernia: There is no hernia in the left inguinal area or right inguinal area.  Genitourinary:    Exam position: Lithotomy position.     Pubic Area: No rash or pubic lice.      Tanner stage (genital): 5.     Labia:        Right: No rash, tenderness, lesion or injury.        Left: No rash, tenderness, lesion or injury.      Urethra: No prolapse, urethral pain, urethral swelling or urethral lesion.     Vagina: Erythema present.     Cervix: Friability present.       Comments: Erythema, friable tissue at vaginal opening and on cervix  Musculoskeletal:     Cervical back: Full passive range of motion without pain and neck supple.     Right lower leg: No edema.     Left lower leg: No edema.  Lymphadenopathy:     Lower Body: No right inguinal adenopathy. No left inguinal adenopathy.  Skin:    General: Skin is warm.     Capillary Refill: Capillary refill takes less than 2 seconds.  Neurological:     General: No focal deficit present.     Mental Status: She is alert, oriented to person, place, and time and easily aroused. Mental status is at baseline.     GCS: GCS eye subscore is 4. GCS verbal subscore is 5. GCS motor subscore is 6.     Motor: No  weakness.  Psychiatric:        Attention and Perception:  Attention and perception normal.        Mood and Affect: Mood and affect normal.        Speech: Speech normal.        Behavior: Behavior normal. Behavior is cooperative.        Thought Content: Thought content normal. Thought content does not include homicidal or suicidal ideation. Thought content does not include homicidal or suicidal plan.        Cognition and Memory: Cognition and memory normal.        Judgment: Judgment normal.     Results for orders placed or performed in visit on 03/23/23  WET PREP FOR TRICH, YEAST, CLUE   Specimen: Vaginal Swab   Vaginal Swab  Result Value Ref Range   Trichomonas Exam Negative Negative   Yeast Exam Negative Negative   Clue Cell Exam Negative Negative  Microscopic Examination   Urine  Result Value Ref Range   WBC, UA 0-5 0 - 5 /hpf   RBC, Urine None seen 0 - 2 /hpf   Epithelial Cells (non renal) 0-10 0 - 10 /hpf   Renal Epithel, UA None seen None seen /hpf   Casts None seen None seen /lpf   Cast Type None seen N/A   Crystals Present (A) N/A   Crystal Type Calcium Oxalate N/A   Mucus, UA Present (A) Not Estab.   Bacteria, UA Few (A) None seen/Few   Yeast, UA None seen None seen  Urinalysis, Routine w reflex microscopic  Result Value Ref Range   Specific Gravity, UA 1.025 1.005 - 1.030   pH, UA 6.0 5.0 - 7.5   Color, UA Yellow Yellow   Appearance Ur Clear Clear   Leukocytes,UA Trace (A) Negative   Protein,UA Negative Negative/Trace   Glucose, UA Negative Negative   Ketones, UA Negative Negative   RBC, UA Negative Negative   Bilirubin, UA Negative Negative   Urobilinogen, Ur 1.0 0.2 - 1.0 mg/dL   Nitrite, UA Negative Negative   Microscopic Examination See below:        03/29/2023   10:10 AM 02/27/2023    8:16 AM 02/02/2023    3:58 PM 10/10/2022   10:21 AM 09/19/2022    1:46 PM  Depression screen PHQ 2/9  Decreased Interest 0 0 1 0 0  Down, Depressed, Hopeless  0 0 0 1  PHQ - 2 Score 0 0 1 0 1  Altered sleeping 0 1 0 1 0   Tired, decreased energy 2 2 2 2 3   Change in appetite 0 0 0 0 0  Feeling bad or failure about yourself  0 0 0 0 0  Trouble concentrating 1 1 0 2 3  Moving slowly or fidgety/restless 0 1 0 0 3  Suicidal thoughts 0 0 0 0 0  PHQ-9 Score 3 5 3 5 10   Difficult doing work/chores Extremely dIfficult Extremely dIfficult Extremely dIfficult Somewhat difficult Very difficult       03/29/2023   10:10 AM 02/27/2023    8:16 AM 02/02/2023    3:58 PM 10/10/2022   10:22 AM  GAD 7 : Generalized Anxiety Score  Nervous, Anxious, on Edge 1 1 1 1   Control/stop worrying 1 1 1 1   Worry too much - different things 1 1 1 1   Trouble relaxing 0 1 1 1   Restless 0 0 0 0  Easily annoyed or irritable 2 1 1  0  Afraid - awful might happen 3 3 1 3   Total GAD 7 Score 8 8 6 7   Anxiety Difficulty Extremely difficult Extremely difficult Extremely difficult Somewhat difficult    Pertinent labs & imaging results that were available during my care of the patient were reviewed by me and considered in my medical decision making.  Assessment & Plan:  Laurie Kelley was seen today for vaginitis.  Diagnoses and all orders for this visit:  Vaginal irritation Labs as below. Will communicate results to patient once available. Will await results to determine next steps.  Will start antibiotic as below based on UA. Will provide diflucan given recent yeast infections. Referral placed to gynecology for patient to follow up for further evaluation.  -     WET PREP FOR TRICH, YEAST, CLUE -     Herpes simplex virus culture -     nitrofurantoin, macrocrystal-monohydrate, (MACROBID) 100 MG capsule; Take 1 capsule (100 mg total) by mouth 2 (two) times daily for 5 days. -     fluconazole (DIFLUCAN) 150 MG tablet; 1 po q week x 4 weeks -     Ambulatory referral to Gynecology  Dysuria -     Urinalysis, Routine w reflex microscopic -     Urine Culture -     nitrofurantoin, macrocrystal-monohydrate, (MACROBID) 100 MG capsule; Take 1 capsule  (100 mg total) by mouth 2 (two) times daily for 5 days. -     fluconazole (DIFLUCAN) 150 MG tablet; 1 po q week x 4 weeks   Continue all other maintenance medications.  Follow up plan: Return if symptoms worsen or fail to improve.  Continue healthy lifestyle choices, including diet (rich in fruits, vegetables, and lean proteins, and low in salt and simple carbohydrates) and exercise (at least 30 minutes of moderate physical activity daily).  Written and verbal instructions provided   The above assessment and management plan was discussed with the patient. The patient verbalized understanding of and has agreed to the management plan. Patient is aware to call the clinic if they develop any new symptoms or if symptoms persist or worsen. Patient is aware when to return to the clinic for a follow-up visit. Patient educated on when it is appropriate to go to the emergency department.   Neale Burly, DNP-FNP [facility] [address] [place], [place] [address] [phone]

## 2023-03-31 LAB — URINE CULTURE

## 2023-04-01 LAB — HERPES SIMPLEX VIRUS CULTURE

## 2023-04-06 ENCOUNTER — Encounter: Payer: Self-pay | Admitting: Family Medicine

## 2023-04-06 ENCOUNTER — Ambulatory Visit (INDEPENDENT_AMBULATORY_CARE_PROVIDER_SITE_OTHER): Payer: Commercial Managed Care - PPO | Admitting: Family Medicine

## 2023-04-06 VITALS — BP 130/78 | HR 87 | Temp 98.0°F | Ht 65.0 in | Wt 181.2 lb

## 2023-04-06 DIAGNOSIS — N898 Other specified noninflammatory disorders of vagina: Secondary | ICD-10-CM

## 2023-04-06 LAB — URINALYSIS, ROUTINE W REFLEX MICROSCOPIC
Bilirubin, UA: NEGATIVE
Glucose, UA: NEGATIVE
Ketones, UA: NEGATIVE
Leukocytes,UA: NEGATIVE
Nitrite, UA: NEGATIVE
Protein,UA: NEGATIVE
Specific Gravity, UA: <1.005 — ABNORMAL LOW (ref 1.005–1.030)
Urobilinogen, Ur: 0.2 mg/dL (ref 0.2–1.0)
pH, UA: 6.5 (ref 5.0–7.5)

## 2023-04-06 LAB — MICROSCOPIC EXAMINATION
Bacteria, UA: NONE SEEN
RBC, Urine: NONE SEEN /[HPF] (ref 0–2)
Yeast, UA: NONE SEEN

## 2023-04-06 LAB — WET PREP FOR TRICH, YEAST, CLUE
Clue Cell Exam: NEGATIVE
Trichomonas Exam: NEGATIVE
Yeast Exam: NEGATIVE

## 2023-04-06 NOTE — Progress Notes (Signed)
Subjective:  Patient ID: Laurie Kelley, female    DOB: 01-Apr-1971, 52 y.o.   MRN: [id number]  Patient Care Team: Raliegh Ip, DO as PCP - General (Family Medicine)   Chief Complaint:  vaginal sore (X 4 days )   HPI: Laurie Kelley is a 52 y.o. female presenting on 04/06/2023 for vaginal sore (X 4 days )   Discussed the use of AI scribe software for clinical note transcription with the patient, who gave verbal consent to proceed.  History of Present Illness   The patient, with a diagnosis of herpes from 32 years ago, presents with a recent sexual encounter after a long period of abstinence. She reports a persistent irritation and bleeding, which was previously evaluated by another provider. During that visit, a herpes test was conducted, which returned negative. The patient denies any history of herpes outbreaks or symptoms.  Following the recent sexual encounter, the patient noticed a sore on the right side of her genital area within a few days. She is unsure if it is merely irritation or a lesion.          Relevant past medical, surgical, family, and social history reviewed and updated as indicated.  Allergies and medications reviewed and updated. Data reviewed: Chart in Epic.   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   Cancer (HCC)    Chest pain in adult 07/31/2017   Family history of brain tumor    Family history of brain tumor 06/27/2021   Gallstones    Headache    Hyperlipidemia with target LDL less than 100 08/18/2015   Internal hemorrhoids 08/18/2014   Lynch syndrome    PONV (postoperative nausea and vomiting)     Past Surgical History:  Procedure Laterality Date   ABDOMINAL HYSTERECTOMY N/A 03/08/2022   Procedure: HYSTERECTOMY ABDOMINAL;  Surgeon: Myna Hidalgo, DO;  Location: [facility];  Service: Gynecology;  Laterality: N/A;   ADJACENT  TISSUE TRANSFER/TISSUE REARRANGEMENT Right 11/22/2022   Procedure: Fat grafting to right  temple from right buttocks after brain surgery;  Surgeon: Peggye Form, DO;  Location: [facility];  Service: Plastics;  Laterality: Right;   APPLICATION OF CRANIAL NAVIGATION Left 02/18/2021   Procedure: APPLICATION OF CRANIAL NAVIGATION;  Surgeon: Jadene Pierini, MD;  Location: [facility];  Service: Neurosurgery;  Laterality: Left;   APPLICATION OF CRANIAL NAVIGATION N/A 05/18/2021   Procedure: APPLICATION OF CRANIAL NAVIGATION;  Surgeon: Jadene Pierini, MD;  Location: [facility];  Service: Neurosurgery;  Laterality: N/A;   BIOPSY  06/28/2022   Procedure: BIOPSY;  Surgeon: Shellia Cleverly, DO;  Location: [facility];  Service: Gastroenterology;;   BRAIN SURGERY     CHOLECYSTECTOMY  1993   COLONOSCOPY WITH PROPOFOL N/A 06/28/2022   Procedure: COLONOSCOPY WITH PROPOFOL;  Surgeon: Shellia Cleverly, DO;  [facility];  Service: Gastroenterology;  Laterality: N/A;   CRANIOTOMY Left 02/18/2021   Procedure: Left orbitozygomatic craniotomy for tumor resection  with Brain Lab;  Surgeon: Jadene Pierini, MD;  [facility];  Service: Neurosurgery;  Laterality: Left;   CRANIOTOMY Right 05/18/2021   Procedure: Right Orbitozygomatic craniotomy for tumor with brainlab;  Surgeon: Jadene Pierini, MD;  Location: [facility] OR;  Service: Neurosurgery;  Laterality: Right;   ESOPHAGOGASTRODUODENOSCOPY (EGD) WITH PROPOFOL N/A 06/28/2022   Procedure: ESOPHAGOGASTRODUODENOSCOPY (EGD) WITH PROPOFOL;  Surgeon: Shellia Cleverly, DO;  [facility];  Service: Gastroenterology;  Laterality: N/A;   POLYPECTOMY  06/28/2022   Procedure: POLYPECTOMY;  Surgeon: Shellia Cleverly, DO;  [facility];  Service: Gastroenterology;;   SALPINGOOPHORECTOMY Bilateral 03/08/2022   Procedure: OPEN SALPINGO OOPHORECTOMY;  Surgeon: Myna Hidalgo, DO;  Location: [facility];  Service: Gynecology;   Laterality: Bilateral;   SHOULDER ARTHROSCOPY Right 11/06/2019   Procedure: ARTHROSCOPY RIGHT SHOULDER, DEBRIDEMENT X TWO STRUCTURES; OPEN ROTATOR CUFF REPAIR;  Surgeon: Vickki Hearing, MD;  Location: [facility];  Service: Orthopedics;  Laterality: Right;    Social History   Socioeconomic History   Marital status: Divorced    Spouse name: Not on file   Number of children: 2   Years of education: Not on file   Highest education level: Associate degree: occupational, Scientist, product/process development, or vocational program  Occupational History   Not on file  Tobacco Use   Smoking status: Never   Smokeless tobacco: Never  Vaping Use   Vaping status: Never Used  Substance and Sexual Activity   Alcohol use: No   Drug use: No   Sexual activity: Not Currently    Birth control/protection: Injection  Other Topics Concern   Not on file  Social History Narrative   Not on file   Social Determinants of Health   Financial Resource Strain: Low Risk  (04/05/2023)   Overall Financial Resource Strain (CARDIA)    Difficulty of Paying Living Expenses: Not hard at all  Food Insecurity: No Food Insecurity (04/05/2023)   Hunger Vital Sign    Worried About Running Out of Food in the Last Year: Never true    Ran Out of Food in the Last Year: Never true  Transportation Needs: No Transportation Needs (04/05/2023)   PRAPARE - Administrator, Civil Service (Medical): No    Lack of Transportation (Non-Medical): No  Physical Activity: Unknown (04/05/2023)   Exercise Vital Sign    Days of Exercise per Week: 0 days    Minutes of Exercise per Session: Not on file  Stress: Stress Concern Present (04/05/2023)   [employer] of Occupational Health - Occupational Stress Questionnaire    Feeling of Stress : To some extent  Social Connections: Socially Isolated (04/05/2023)   Social Connection and Isolation Panel [NHANES]    Frequency of Communication with Friends and Family: More than three times a week     Frequency of Social Gatherings with Friends and Family: Once a week    Attends Religious Services: Never    Database administrator or Organizations: No    Attends Engineer, structural: Not on file    Marital Status: Divorced  Intimate Partner Violence: Not At Risk (12/29/2022)   Received from [facility]   HITS    Over the last 12 months how often did your partner physically hurt you?: 1    Over the last 12 months how often did your partner insult you or talk down to you?: 1    Over the last 12 months how often did your partner threaten you with physical harm?: 1    Over the last 12 months how often did your partner scream or curse at you?: 1    Outpatient Encounter Medications as of 04/06/2023  Medication Sig   atorvastatin (LIPITOR) 20 MG tablet Take 1 tablet (20 mg total) by mouth daily.   BIOTIN PO Take 1 tablet by mouth daily.   Cholecalciferol (DIALYVITE VITAMIN D 5000)  125 MCG (5000 UT) capsule Take 5,000 Units by mouth daily.   ciclopirox (PENLAC) 8 % solution Apply topically at bedtime. Apply over nail and surrounding skin. Apply daily over previous coat. After seven (7) days, may remove with alcohol and continue cycle.   fluconazole (DIFLUCAN) 150 MG tablet 1 po q week x 4 weeks   gabapentin (NEURONTIN) 600 MG tablet Take 600 mg by mouth 3 (three) times daily.   hydrocortisone 1 % ointment Apply 1 Application topically 2 (two) times daily as needed for itching.   Probiotic Product (PROBIOTIC DAILY PO) Take by mouth.   No facility-administered encounter medications on file as of 04/06/2023.    Allergies  Allergen Reactions   Gadolinium Derivatives Other (See Comments)    Pt reports she developed chest tightness, burning of her back, and weakness after MRI contrast 11/2021. Per Dr. Alfredo Batty, he suggests 13 hr protocol prior to MRI.    Saline Nausea And Vomiting   Sodium Chloride Nausea And Vomiting    Pertinent ROS per HPI, otherwise unremarkable       Objective:  BP 130/78   Pulse 87   Temp 98 F (36.7 C) (Temporal)   Ht 5\' 5"  (1.651 m)   Wt 181 lb 3.2 oz (82.2 kg)   SpO2 99%   BMI 30.15 kg/m    Wt Readings from Last 3 Encounters:  04/06/23 181 lb 3.2 oz (82.2 kg)  03/29/23 180 lb (81.6 kg)  03/23/23 181 lb 9.6 oz (82.4 kg)    Physical Exam Vitals and nursing note reviewed. Exam conducted with a chaperone present.  Constitutional:      General: She is not in acute distress.    Appearance: Normal appearance. She is not ill-appearing, toxic-appearing or diaphoretic.  HENT:     Head: Normocephalic and atraumatic.     Nose: Nose normal.     Mouth/Throat:     Mouth: Mucous membranes are moist.  Eyes:     Conjunctiva/sclera: Conjunctivae normal.     Pupils: Pupils are equal, round, and reactive to light.  Cardiovascular:     Rate and Rhythm: Normal rate and regular rhythm.     Heart sounds: Normal heart sounds.  Pulmonary:     Effort: Pulmonary effort is normal.     Breath sounds: Normal breath sounds.  Genitourinary:    Exam position: Lithotomy position.    Skin:    General: Skin is warm and dry.     Capillary Refill: Capillary refill takes less than 2 seconds.  Neurological:     General: No focal deficit present.     Mental Status: She is alert and oriented to person, place, and time.  Psychiatric:        Mood and Affect: Mood normal.        Behavior: Behavior normal.        Thought Content: Thought content normal.        Judgment: Judgment normal.    Physical Exam   SKIN: Lesion on right side, downward, resembling an abrasion. Appears more consistent with an aphthous ulcer than a herpes lesion.        Results for orders placed or performed in visit on 03/29/23  WET PREP FOR TRICH, YEAST, CLUE   Specimen: Vaginal Swab   Vaginal Swab  Result Value Ref Range   Trichomonas Exam Negative Negative   Yeast Exam Negative Negative   Clue Cell Exam Negative Negative  Urine Culture   Specimen: Urine   UR  Result Value Ref Range   Urine Culture, Routine Final report    Organism ID, Bacteria Comment   Herpes simplex virus culture   Specimen: Vaginal Swab   VA  Result Value Ref Range   HSV Culture/Type Comment   Microscopic Examination   Urine  Result Value Ref Range   WBC, UA 0-5 0 - 5 /hpf   RBC, Urine 0-2 0 - 2 /hpf   Epithelial Cells (non renal) 0-10 0 - 10 /hpf   Renal Epithel, UA None seen None seen /hpf   Bacteria, UA Few (A) None seen/Few   Yeast, UA None seen None seen  Urinalysis, Routine w reflex microscopic  Result Value Ref Range   Specific Gravity, UA >1.030 (H) 1.005 - 1.030   pH, UA 5.5 5.0 - 7.5   Color, UA Yellow Yellow   Appearance Ur Clear Clear   Leukocytes,UA 1+ (A) Negative   Protein,UA Trace (A) Negative/Trace   Glucose, UA Negative Negative   Ketones, UA Negative Negative   RBC, UA 2+ (A) Negative   Bilirubin, UA Negative Negative   Urobilinogen, Ur 1.0 0.2 - 1.0 mg/dL   Nitrite, UA Negative Negative   Microscopic Examination See below:        Pertinent labs & imaging results that were available during my care of the patient were reviewed by me and considered in my medical decision making.  Assessment & Plan:  Joss was seen today for vaginal sore.  Diagnoses and all orders for this visit:  Vaginal sore -     Urinalysis, Routine w reflex microscopic -     WET PREP FOR TRICH, YEAST, CLUE -     Herpes simplex virus culture -     RPR     Assessment and Plan    Genital Lesion New onset genital lesion on the right side of the vulva, noticed 3-4 days after sexual intercourse. No prior history of similar lesions. Previous negative HSV-2 test, but no active lesion at the time of testing. Lesion appears to be an aphthous ulcer rather than a typical herpes lesion. -Swab lesion for HSV-2 culture. -Apply CeraVe healing ointment to the lesion.  Sexually Transmitted Infections Recent sexual encounter after a long period of abstinence. No known  history of STIs, but concern for possible exposure. -Draw blood for RPR to test for syphilis. -Consider testing for HIV, Hep B, and Hep C, although the patient has had recent blood transfusions which would have been screened for these infections.          Continue all other maintenance medications.  Follow up plan: Return if symptoms worsen or fail to improve.   Continue healthy lifestyle choices, including diet (rich in fruits, vegetables, and lean proteins, and low in salt and simple carbohydrates) and exercise (at least 30 minutes of moderate physical activity daily).    The above assessment and management plan was discussed with the patient. The patient verbalized understanding of and has agreed to the management plan. Patient is aware to call the clinic if they develop any new symptoms or if symptoms persist or worsen. Patient is aware when to return to the clinic for a follow-up visit. Patient educated on when it is appropriate to go to the emergency department.   Kari Baars, FNP-C [facility] [phone]

## 2023-04-07 LAB — RPR: RPR Ser Ql: NONREACTIVE

## 2023-04-08 LAB — HERPES SIMPLEX VIRUS CULTURE

## 2023-04-09 NOTE — Progress Notes (Signed)
USN if negative for parathyroid adenoma.  No significant thyroid nodules.  Await reading of nuclear med sestamibi scan.  Darnell Level, MD [facility] practice Office: [phone]

## 2023-04-11 ENCOUNTER — Encounter: Payer: Self-pay | Admitting: Obstetrics & Gynecology

## 2023-04-11 ENCOUNTER — Ambulatory Visit: Payer: Commercial Managed Care - PPO | Admitting: Obstetrics & Gynecology

## 2023-04-11 ENCOUNTER — Other Ambulatory Visit (HOSPITAL_COMMUNITY)
Admission: RE | Admit: 2023-04-11 | Discharge: 2023-04-11 | Disposition: A | Discharge status: Home / Self Care | Payer: Commercial Managed Care - PPO | Source: Ambulatory Visit | Attending: Obstetrics & Gynecology | Admitting: Obstetrics & Gynecology

## 2023-04-11 VITALS — BP 124/78 | HR 77 | Ht 65.0 in | Wt 179.0 lb

## 2023-04-11 DIAGNOSIS — Z90710 Acquired absence of both cervix and uterus: Secondary | ICD-10-CM | POA: Diagnosis not present

## 2023-04-11 DIAGNOSIS — Z90721 Acquired absence of ovaries, unilateral: Secondary | ICD-10-CM | POA: Diagnosis not present

## 2023-04-11 DIAGNOSIS — N952 Postmenopausal atrophic vaginitis: Secondary | ICD-10-CM | POA: Diagnosis present

## 2023-04-11 DIAGNOSIS — N939 Abnormal uterine and vaginal bleeding, unspecified: Secondary | ICD-10-CM

## 2023-04-11 DIAGNOSIS — N898 Other specified noninflammatory disorders of vagina: Secondary | ICD-10-CM

## 2023-04-11 MED ORDER — PREMARIN 0.625 MG/GM VA CREA
TOPICAL_CREAM | VAGINAL | 3 refills | Status: DC
Start: 2023-04-11 — End: 2023-07-06

## 2023-04-11 NOTE — Progress Notes (Signed)
GYN VISIT Patient name: Laurie Kelley MRN [id number]  Date of birth: 1970-11-19 Chief Complaint:   vaginal irritation  History of Present Illness:   Laurie Kelley is a 52 y.o. G77P0 PM, PH female being seen today for the following concerns:  -02/27/23- Had annual exam- noted vaginal irritation.  Vaginitis panel came back with yeast- treated and noted improvement.  It seems to be that every time she becomes aroused she notes irritation and discomfort.  She returned to PCP, did a self swab and noted vaginal spotting.  Eventually symptoms resolved on their own.  About 2 weeks ago, had sex and noted again the spotting.  Denies irregular discharge, itching or odor.  Denies considerable pain during sex though if he seemed to hit one spot.  Prior to this, she had not been sexually active for many years.  S/p TAH,BSO due to Lynch syndrome     No LMP recorded. Patient has had a hysterectomy.    Review of Systems:   Pertinent items are noted in HPI Denies fever/chills, dizziness, headaches, visual disturbances, fatigue, shortness of breath, chest pain, abdominal pain, vomiting. Pertinent History Reviewed:   Past Surgical History:  Procedure Laterality Date   ABDOMINAL HYSTERECTOMY N/A 03/08/2022   Procedure: HYSTERECTOMY ABDOMINAL;  Surgeon: Myna Hidalgo, DO;  Location: [facility];  Service: Gynecology;  Laterality: N/A;   ADJACENT TISSUE TRANSFER/TISSUE REARRANGEMENT Right 11/22/2022   Procedure: Fat grafting to right  temple from right buttocks after brain surgery;  Surgeon: Peggye Form, DO;  Location: [facility];  Service: Plastics;  Laterality: Right;   APPLICATION OF CRANIAL NAVIGATION Left 02/18/2021   Procedure: APPLICATION OF CRANIAL NAVIGATION;  Surgeon: Jadene Pierini, MD;  Location: [facility];  Service: Neurosurgery;  Laterality: Left;   APPLICATION OF CRANIAL NAVIGATION N/A 05/18/2021   Procedure: APPLICATION OF CRANIAL NAVIGATION;  Surgeon: Jadene Pierini, MD;  Location: [facility];  Service: Neurosurgery;  Laterality: N/A;   BIOPSY  06/28/2022   Procedure: BIOPSY;  Surgeon: Shellia Cleverly, DO;  [facility];  Service: Gastroenterology;;   BRAIN SURGERY     CHOLECYSTECTOMY  1993   COLONOSCOPY WITH PROPOFOL N/A 06/28/2022   Procedure: COLONOSCOPY WITH PROPOFOL;  Surgeon: Shellia Cleverly, DO;  [facility];  Service: Gastroenterology;  Laterality: N/A;   CRANIOTOMY Left 02/18/2021   Procedure: Left orbitozygomatic craniotomy for tumor resection  with Brain Lab;  Surgeon: Jadene Pierini, MD;  Location: [facility];  Service: Neurosurgery;  Laterality: Left;   CRANIOTOMY Right 05/18/2021   Procedure: Right Orbitozygomatic craniotomy for tumor with brainlab;  Surgeon: Jadene Pierini, MD;  [facility];  Service: Neurosurgery;  Laterality: Right;   ESOPHAGOGASTRODUODENOSCOPY (EGD) WITH PROPOFOL N/A 06/28/2022   Procedure: ESOPHAGOGASTRODUODENOSCOPY (EGD) WITH PROPOFOL;  Surgeon: Shellia Cleverly, DO;  [facility];  Service: Gastroenterology;  Laterality: N/A;   POLYPECTOMY  06/28/2022   Procedure: POLYPECTOMY;  Surgeon: Shellia Cleverly, DO;  [facility];  Service: Gastroenterology;;   SALPINGOOPHORECTOMY Bilateral 03/08/2022   Procedure: OPEN SALPINGO OOPHORECTOMY;  Surgeon: Myna Hidalgo, DO;  Location: [facility];  Service: Gynecology;  Laterality: Bilateral;   SHOULDER ARTHROSCOPY Right 11/06/2019   Procedure: ARTHROSCOPY RIGHT SHOULDER, DEBRIDEMENT X TWO STRUCTURES; OPEN ROTATOR CUFF REPAIR;  Surgeon: Vickki Hearing, MD;  Location: [facility];  Service: Orthopedics;  Laterality: Right;    Past Medical History:  Diagnosis Date   Blood transfusion without reported diagnosis    Brain tumor (  benign) (HCC)    12 total, 4 have been removed   Cancer (HCC)    Chest pain in adult 07/31/2017   Family history of brain tumor    Family history of brain tumor 06/27/2021   Gallstones    Headache     Hyperlipidemia with target LDL less than 100 08/18/2015   Internal hemorrhoids 08/18/2014   Lynch syndrome    PONV (postoperative nausea and vomiting)    Reviewed problem list, medications and allergies. Physical Assessment:   Vitals:   04/11/23 1523  BP: 124/78  Pulse: 77  Weight: 179 lb (81.2 kg)  Height: 5\' 5"  (1.651 m)  Body mass index is 29.79 kg/m.       Physical Examination:   General appearance: alert, well appearing, and in no distress  Psych: mood appropriate, normal affect  Skin: warm & dry   Cardiovascular: normal heart rate noted  Respiratory: normal respiratory effort, no distress  Abdomen: soft, non-tender, no rebound, no guarding  Pelvic: VULVA: normal appearing vulva with no masses, tenderness or lesions, VAGINA: normal appearing vagina with normal color and discharge, no lesions.  Some spotting/friability noted with palpation- flat mucosa appreciated.  Vaginal cuff intact- no lesion or masses noted.  On bimanual exam- no abnormalities appreciated  Extremities: no edema   Chaperone:  Sherri Kaywood     Assessment & Plan:  1) Vaginal atrophy/spotting -no abnormalities noted on exam -plan for vaginitis panel to r/o underlying infection -discussed OTC options such as personal moisturizers -discussed treatment with local estrogen therapy -f/u in 3mos  Meds ordered this encounter  Medications   conjugated estrogens (PREMARIN) vaginal cream    Sig: 0.5g (pea-sized amount) twice per week    Dispense:  42.5 g    Refill:  3   Pt denies history of any of the following:  Untreated hypertension, active liver disease with abnormal liver function tests, active or recent arterial thromboembolic disease, or previous or current venous thromboembolism   Return in about 3 months (around 07/12/2023) for Medication follow up, with Dr. Charlotta Newton.   Myna Hidalgo, DO Attending Obstetrician & Gynecologist, [facility] for [employer], [facility]

## 2023-04-13 ENCOUNTER — Encounter: Payer: Self-pay | Admitting: Obstetrics & Gynecology

## 2023-04-13 ENCOUNTER — Ambulatory Visit: Payer: Self-pay | Admitting: Surgery

## 2023-04-13 NOTE — Progress Notes (Signed)
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  Will plan to proceed with parathyroidectomy as we discussed in the office.  Will enter orders and ask schedulers to contact patient.  Darnell Level, MD [facility] practice Office: [phone]

## 2023-04-16 ENCOUNTER — Telehealth: Payer: Self-pay

## 2023-04-16 LAB — CERVICOVAGINAL ANCILLARY ONLY
Bacterial Vaginitis (gardnerella): NEGATIVE
Candida Glabrata: NEGATIVE
Candida Vaginitis: NEGATIVE
Comment: NEGATIVE
Comment: NEGATIVE
Comment: NEGATIVE

## 2023-04-16 NOTE — Telephone Encounter (Signed)
Pt sees Dr.Nida for hyperparathyroidism and is supposed to be scheduled for surgery in the near future. She called stating her gynecologist has prescribed Premarin vaginal cream and wants to make sure it would not be contraindicated to use for any reason.

## 2023-04-16 NOTE — Telephone Encounter (Signed)
Discussed with pt, understanding voiced. 

## 2023-04-16 NOTE — Telephone Encounter (Signed)
No I dont see any reason she cannot use the vaginal cream.  Make sure she tells the surgeon about it for her preop stuff to be sure.

## 2023-04-19 ENCOUNTER — Encounter (HOSPITAL_COMMUNITY): Payer: Self-pay

## 2023-04-19 ENCOUNTER — Other Ambulatory Visit: Payer: Self-pay

## 2023-04-19 ENCOUNTER — Encounter (HOSPITAL_COMMUNITY)
Admission: RE | Admit: 2023-04-19 | Discharge: 2023-04-19 | Disposition: A | Discharge status: Home / Self Care | Payer: Commercial Managed Care - PPO | Source: Ambulatory Visit | Attending: Surgery | Admitting: Surgery

## 2023-04-19 NOTE — Patient Instructions (Signed)
SURGICAL WAITING ROOM VISITATION  Patients having surgery or a procedure may have no more than 2 support people in the waiting area - these visitors may rotate.    Children under the age of 35 must have an adult with them who is not the patient.  Due to an increase in RSV and influenza rates and associated hospitalizations, children ages 79 and under may not visit patients in [facility] hospitals.  If the patient needs to stay at the hospital during part of their recovery, the visitor guidelines for inpatient rooms apply. Pre-op nurse will coordinate an appropriate time for 1 support person to accompany patient in pre-op.  This support person may not rotate.    Please refer to the [facility] website for the visitor guidelines for Inpatients (after your surgery is over and you are in a regular room).       Your procedure is scheduled on: 04-23-23   Report to [facility] Main Entrance    Report to admitting at       10:45  AM   Call this number if you have problems the morning of surgery [phone]   Do not eat food :After Midnight.   After Midnight you may have the following liquids until _10:00_____ AM DAY OF SURGERY   then nothing by mouth  Water Non-Citrus Juices (without pulp, NO RED-Apple, White grape, White cranberry) Black Coffee (NO MILK/CREAM OR CREAMERS, sugar ok)  Clear Tea (NO MILK/CREAM OR CREAMERS, sugar ok) regular and decaf                             Plain Jell-O (NO RED)                                           Fruit ices (not with fruit pulp, NO RED)                                     Popsicles (NO RED)                                                               Sports drinks like Gatorade (NO RED)                           If you have questions, please contact your surgeon's office.   FOLLOW ANY ADDITIONAL PRE OP INSTRUCTIONS YOU RECEIVED FROM YOUR SURGEON'S OFFICE!!!     Oral Hygiene is also important to reduce your risk of infection.                                     Remember - BRUSH YOUR TEETH THE MORNING OF SURGERY WITH YOUR REGULAR TOOTHPASTE  DENTURES WILL BE REMOVED PRIOR TO SURGERY PLEASE DO NOT APPLY "Poly grip" OR ADHESIVES!!!   Do NOT smoke after Midnight   Stop all vitamins and herbal supplements 7 days before surgery.   Take these  medicines the morning of surgery with A SIP OF WATER: Gabapentin, atorvastatin  DO NOT TAKE ANY ORAL DIABETIC MEDICATIONS DAY OF YOUR SURGERY  Bring CPAP mask and tubing day of surgery.                              You may not have any metal on your body including hair pins, jewelry, and body piercing             Do not wear make-up, lotions, powders, perfumes/cologne, or deodorant  Do not wear nail polish including gel and S&S, artificial/acrylic nails, or any other type of covering on natural nails including finger and toenails. If you have artificial nails, gel coating, etc. that needs to be removed by a nail salon please have this removed prior to surgery or surgery may need to be canceled/ delayed if the surgeon/ anesthesia feels like they are unable to be safely monitored.   Do not shave  48 hours prior to surgery.              Do not bring valuables to the hospital. [facility] IS NOT             RESPONSIBLE   FOR VALUABLES.   Contacts, glasses, dentures or bridgework may not be worn into surgergy.   Bring small overnight bag day of surgery.   DO NOT BRING YOUR HOME MEDICATIONS TO THE HOSPITAL. PHARMACY WILL DISPENSE MEDICATIONS LISTED ON YOUR MEDICATION LIST TO YOU DURING YOUR ADMISSION IN THE HOSPITAL!    Patients discharged on the day of surgery will not be allowed to drive home.  Someone NEEDS to stay with you for the first 24 hours after anesthesia.   Special Instructions: Bring a copy of your healthcare power of attorney and living will documents the day of surgery if you haven't scanned them before.              Please read over the following fact sheets you  were given: IF YOU HAVE QUESTIONS ABOUT YOUR PRE-OP INSTRUCTIONS PLEASE CALL [phone]    If you test positive for Covid or have been in contact with anyone that has tested positive in the last 10 days please notify you surgeon.    [facility] - Preparing for Surgery Before surgery, you can play an important role.  Because skin is not sterile, your skin needs to be as free of germs as possible.  You can reduce the number of germs on your skin by washing with CHG (chlorahexidine gluconate) soap before surgery.  CHG is an antiseptic cleaner which kills germs and bonds with the skin to continue killing germs even after washing. Please DO NOT use if you have an allergy to CHG or antibacterial soaps.  If your skin becomes reddened/irritated stop using the CHG and inform your nurse when you arrive at Short Stay. Do not shave (including legs and underarms) for at least 48 hours prior to the first CHG shower.  You may shave your face/neck. Please follow these instructions carefully:  1.  Shower with CHG Soap the night before surgery and the  morning of Surgery.  2.  If you choose to wash your hair, wash your hair first as usual with your  normal  shampoo.  3.  After you shampoo, rinse your hair and body thoroughly to remove the  shampoo.  4.  Use CHG as you would any other liquid soap.  You can apply chg directly  to the skin and wash                       Gently with a scrungie or clean washcloth.  5.  Apply the CHG Soap to your body ONLY FROM THE NECK DOWN.   Do not use on face/ open                           Wound or open sores. Avoid contact with eyes, ears mouth and genitals (private parts).                       Wash face,  Genitals (private parts) with your normal soap.             6.  Wash thoroughly, paying special attention to the area where your surgery  will be performed.  7.  Thoroughly rinse your body with warm water from the neck down.  8.  DO NOT shower/wash  with your normal soap after using and rinsing off  the CHG Soap.                9.  Pat yourself dry with a clean towel.            10.  Wear clean pajamas.            11.  Place clean sheets on your bed the night of your first shower and do not  sleep with pets. Day of Surgery : Do not apply any lotions/deodorants the morning of surgery.  Please wear clean clothes to the hospital/surgery center.  FAILURE TO FOLLOW THESE INSTRUCTIONS MAY RESULT IN THE CANCELLATION OF YOUR SURGERY PATIENT SIGNATURE_________________________________  NURSE SIGNATURE__________________________________  ________________________________________________________________________

## 2023-04-19 NOTE — Progress Notes (Signed)
PCP - Dr. Doylene Canard, DO Cardiologist - no  PPM/ICD -  Device Orders -  Rep Notified -   Chest x-ray - 08-08-22 epic EKG -  Stress Test -  ECHO -  Cardiac Cath -   Sleep Study -  CPAP -   Fasting Blood Sugar -  Checks Blood Sugar _____ times a day  Blood Thinner Instructions: Aspirin Instructions:  ERAS Protcol - PRE-SURGERY -    COVID vaccine -yes x 3  Activity--Able to climb a flight of stairs without CP or SOB Anesthesia review: Lynch syndrome,  Patient denies shortness of breath, fever, cough and chest pain at PAT appointment   All instructions explained to the patient, with a verbal understanding of the material. Patient agrees to go over the instructions while at home for a better understanding. Patient also instructed to self quarantine after being tested for COVID-19. The opportunity to ask questions was provided.

## 2023-04-20 ENCOUNTER — Encounter (HOSPITAL_COMMUNITY)
Admission: RE | Admit: 2023-04-20 | Discharge: 2023-04-20 | Disposition: A | Discharge status: Home / Self Care | Payer: Commercial Managed Care - PPO | Source: Ambulatory Visit | Attending: Family Medicine | Admitting: Family Medicine

## 2023-04-20 HISTORY — DX: Hyperparathyroidism, unspecified: E21.3

## 2023-04-22 ENCOUNTER — Encounter (HOSPITAL_COMMUNITY): Payer: Self-pay | Admitting: Surgery

## 2023-04-22 NOTE — H&P (Signed)
REFERRING PHYSICIAN: Purcell Nails, MD  PROVIDER: Opal Dinning Myra Rude, MD   Chief Complaint: New Consultation  History of Present Illness:  Patient is referred by Dr. Marquis Lunch for surgical evaluation and management of newly diagnosed primary hyperparathyroidism. Patient gives a history of hypercalcemia dating back as long as 2016. Recent calcium levels have ranged as high as 11.9. Intact PTH was measured at 122. 25-hydroxy vitamin D level was normal at 62.9. Patient underwent a 24-hour urine collection for calcium which was within the normal range at 247. Patient has been symptomatic. She notes significant fatigue. She has bone and joint discomfort. She has had a bone density scan showing osteopenia. She has a history of nephrolithiasis. She has had issues with memory. She has had urinary frequency. Patient has not had any imaging studies performed. There is no family history of parathyroid disease or other endocrine neoplasms. Patient has undergone multiple procedures related to her history of multiple meningiomas. She has not had any cervical procedures performed. Patient presents today to discuss her new diagnosis of primary hyperparathyroidism and what will be involved with further evaluation and management.  Review of Systems: A complete review of systems was obtained from the patient. I have reviewed this information and discussed as appropriate with the patient. See HPI as well for other ROS.  Review of Systems  Constitutional: Positive for malaise/fatigue.  HENT: Negative.  Eyes: Negative.  Respiratory: Negative.  Cardiovascular: Negative.  Gastrointestinal: Positive for constipation.  Genitourinary: Positive for frequency.  Nephrolithiasis  Musculoskeletal: Negative.  Skin: Negative.  Neurological: Positive for headaches.  Endo/Heme/Allergies: Negative.  Psychiatric/Behavioral: Positive for memory loss.    Medical History: Past Medical History:  Diagnosis  Date  History of headache  Hypercholesterolemia  Meningioma (CMS/HHS-HCC)  Vitamin D deficiency   Patient Active Problem List  Diagnosis  Acquired facial asymmetry  BMI 30.0-30.9,adult  Brain tumor (CMS/HHS-HCC)  Conductive hearing loss, bilateral  Esophageal reflux  ETD (Eustachian tube dysfunction), bilateral  Family history of brain tumor  History of resection of meningioma  Hyperlipidemia with target LDL less than 100  Internal hemorrhoids  Low HDL (under 40)  Myalgia due to statin  Nontraumatic incomplete tear of right rotator cuff  Osteopenia of neck of right femur  Pain in joint of left shoulder  PMS2-related Lynch syndrome (HNPCC4)  Right chronic serous otitis media  S/P arthroscopy of right shoulder  Skull mass  Status post craniotomy  Primary hyperparathyroidism (CMS/HHS-HCC)   Past Surgical History:  Procedure Laterality Date  HYSTERECTOMY TOTAL ABDOMINAL W/REMOVAL TUBES &/OR OVARIES Bilateral 03/08/2022  ARTHROSCOPY SHOULDER Right  BRAIN SURGERY  2x    Allergies  Allergen Reactions  Gadolinium-Containing Contrast Media Other (See Comments)  Pt reports she developed chest tightness, burning of her back, and weakness after MRI contrast 11/2021. Per Dr. Alfredo Batty, he suggests 13 hr protocol prior to MRI.  Sodium Chloride Nausea And Vomiting  Sodium Chloride 0.9 % Nausea And Vomiting   Current Outpatient Medications on File Prior to Visit  Medication Sig Dispense Refill  atorvastatin (LIPITOR) 20 MG tablet Take 20 mg by mouth once daily  gabapentin (NEURONTIN) 300 MG capsule Take 300 mg by mouth 3 (three) times daily  cholecalciferol (VITAMIN D3) 5,000 unit capsule Take 5,000 Units by mouth once daily (Patient not taking: Reported on 03/15/2023)  ciprofloxacin-dexAMETHasone (CIPRODEX) otic suspension Place 4 drops into the right ear at bedtime (Patient not taking: Reported on 03/15/2023)  hydrocortisone 1 % cream Apply topically once daily as  needed (Patient not  taking: Reported on 03/15/2023)  loratadine-pseudoephedrine (CLARITIN-D 24-HOUR) 10-240 mg ER tablet Take 1 tablet by mouth once daily (Patient not taking: Reported on 03/15/2023)  pumpkin seed extract-soy germ 300 mg Cap Take 1 capsule by mouth at bedtime (Patient not taking: Reported on 03/15/2023)   No current facility-administered medications on file prior to visit.   Family History  Problem Relation Age of Onset  No Known Problems Mother  Heart defect Father  Prostate cancer Father  Other Sister  Benign brain tumors  Brain cancer Maternal Aunt  Bone cancer Maternal Aunt  Liver cancer Paternal Uncle  Alzheimer's disease Maternal Grandmother  Lung cancer Paternal Grandmother  Diabetes Neg Hx  Diabetes type I Neg Hx  Diabetes type II Neg Hx  Thyroid disease Neg Hx  Glaucoma Neg Hx  Macular degeneration Neg Hx    Social History   Tobacco Use  Smoking Status Never  Smokeless Tobacco Never    Social History   Socioeconomic History  Marital status: Divorced  Tobacco Use  Smoking status: Never  Smokeless tobacco: Never  Substance and Sexual Activity  Alcohol use: Not Currently  Drug use: Never  Social History Narrative  Lives in [place]. No pets or roommates. 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   Social Determinants of Health   Financial Resource Strain: Low Risk (12/29/2022)  Received from [facility]  Overall Financial Resource Strain (CARDIA)  Difficulty of Paying Living Expenses: Not hard at all  Food Insecurity: No Food Insecurity (12/29/2022)  Received from [facility]  Hunger Vital Sign  Worried About Running Out of Food in the Last Year: Never true  Ran Out of Food in the Last Year: Never true  Transportation Needs: No Transportation Needs (12/29/2022)  Received from [facility] - Transportation  Lack of Transportation (Medical): No  Lack of  Transportation (Non-Medical): No  Physical Activity: Insufficiently Active (12/29/2022)  Received from [facility]  Exercise Vital Sign  Days of Exercise per Week: 2 days  Minutes of Exercise per Session: 10 min  Stress: Stress Concern Present (12/29/2022)  Received from [facility] Occup[facility] - Occupational Stress Questionnaire  Feeling of Stress : To some extent  Social Connections: Somewhat Isolated (12/29/2022)  Received from [facility]  Social Network  How would you rate your social network (family, work, friends)?: Restricted participation with some degree of social isolation   Objective:   Vitals:  BP: 118/78  Pulse: 57  Temp: 36.7 C (98.1 F)  SpO2: 96%  Weight: 82.4 kg (181 lb 9.6 oz)  Height: 165.1 cm (5\' 5" )  PainSc: 0-No pain   Body mass index is 30.22 kg/m.  Physical Exam   GENERAL APPEARANCE Comfortable, no acute issues Development: normal Gross deformities: none  SKIN Rash, lesions, ulcers: none Induration, erythema: none Nodules: none palpable  EYES Conjunctiva and lids: normal Pupils: equal and reactive  EARS, NOSE, MOUTH, THROAT External ears: no lesion or deformity External nose: no lesion or deformity Hearing: grossly normal  NECK Symmetric: yes Trachea: midline Thyroid: no palpable nodules in the thyroid bed  ABDOMEN Not assessed  GENITOURINARY/RECTAL Not assessed  MUSCULOSKELETAL Station and gait: normal Digits and nails: no clubbing or cyanosis Muscle strength: grossly normal all extremities Range of motion: grossly normal all extremities Deformity: none  LYMPHATIC Cervical: none palpable Supraclavicular: none palpable  PSYCHIATRIC Oriented to  person, place, and time: yes Mood and affect: normal for situation Judgment and insight: appropriate for situation   Assessment and Plan: :  Primary hyperparathyroidism (CMS/HHS-HCC)  Patient is referred by her endocrinologist for  surgical evaluation and management of newly diagnosed primary hyperparathyroidism.  Patient provided with a copy of "Parathyroid Surgery: Treatment for Your Parathyroid Gland Problem", published by [employer], 12 pages. Book reviewed and explained to patient during visit today.  They we reviewed her clinical history. We reviewed her recent laboratory studies. I would like to proceed with further evaluation to include imaging studies. We will order an ultrasound examination of the neck as well as a nuclear medicine parathyroid scan with sestamibi. We discussed the studies. The studies are successful and localizing a parathyroid adenoma and confirming the diagnosis approximately 80% of the time. A single gland adenoma is responsible for the abnormal calcium and parathyroid levels approximately 95% of the time. If the studies indicate a single adenoma, then the patient will be a good candidate for minimally invasive surgery to be performed as an outpatient. Today we discussed the procedure. We discussed the size and location of the surgical incision. We discussed the postoperative recovery to be anticipated. We discussed potential complications including the potential for recurrent laryngeal nerve injury. We discussed the small possibility of multi gland disease. The patient understands and agrees to proceed with imaging studies.  If the studies failed to localize the adenoma, then my second choice for evaluation would be a 4D CT scan with parathyroid protocol. We discussed this test today as well.  Patient will undergo the above imaging studies. We will contact her with the results when they are available and make plans for further management at that time.  Darnell Level, MD [facility] practice Office: [phone]

## 2023-04-23 ENCOUNTER — Other Ambulatory Visit: Payer: Self-pay

## 2023-04-23 ENCOUNTER — Ambulatory Visit (HOSPITAL_COMMUNITY)
Admission: RE | Admit: 2023-04-23 | Discharge: 2023-04-23 | Disposition: A | Discharge status: Home / Self Care | Payer: Commercial Managed Care - PPO | Source: Ambulatory Visit | Attending: Surgery | Admitting: Surgery

## 2023-04-23 ENCOUNTER — Encounter (HOSPITAL_COMMUNITY): Payer: Self-pay | Admitting: Surgery

## 2023-04-23 ENCOUNTER — Ambulatory Visit (HOSPITAL_BASED_OUTPATIENT_CLINIC_OR_DEPARTMENT_OTHER): Payer: Commercial Managed Care - PPO | Admitting: Certified Registered"

## 2023-04-23 ENCOUNTER — Encounter (HOSPITAL_COMMUNITY)
Admission: RE | Disposition: A | Discharge status: Home / Self Care | Payer: Self-pay | Source: Ambulatory Visit | Attending: Surgery

## 2023-04-23 ENCOUNTER — Ambulatory Visit (HOSPITAL_COMMUNITY): Payer: Commercial Managed Care - PPO | Admitting: Certified Registered"

## 2023-04-23 DIAGNOSIS — E210 Primary hyperparathyroidism: Secondary | ICD-10-CM

## 2023-04-23 DIAGNOSIS — M8580 Other specified disorders of bone density and structure, unspecified site: Secondary | ICD-10-CM | POA: Diagnosis not present

## 2023-04-23 DIAGNOSIS — D351 Benign neoplasm of parathyroid gland: Secondary | ICD-10-CM | POA: Diagnosis not present

## 2023-04-23 DIAGNOSIS — Z87442 Personal history of urinary calculi: Secondary | ICD-10-CM | POA: Insufficient documentation

## 2023-04-23 HISTORY — PX: PARATHYROIDECTOMY: SHX19

## 2023-04-23 LAB — BASIC METABOLIC PANEL
Anion gap: 10 (ref 5–15)
BUN: 13 mg/dL (ref 6–20)
CO2: 21 mmol/L — ABNORMAL LOW (ref 22–32)
Calcium: 10.6 mg/dL — ABNORMAL HIGH (ref 8.9–10.3)
Chloride: 109 mmol/L (ref 98–111)
Creatinine, Ser: 0.69 mg/dL (ref 0.44–1.00)
GFR, Estimated: >60 mL/min (ref >60)
Glucose, Bld: 94 mg/dL (ref 70–99)
Potassium: 4.1 mmol/L (ref 3.5–5.1)
Sodium: 140 mmol/L (ref 135–145)

## 2023-04-23 LAB — CBC WITH DIFFERENTIAL/PLATELET
Abs Immature Granulocytes: 0.01 10*3/uL (ref 0.00–0.07)
Basophils Absolute: 0 10*3/uL (ref 0.0–0.1)
Basophils Relative: 1 %
Eosinophils Absolute: 0.2 10*3/uL (ref 0.0–0.5)
Eosinophils Relative: 4 %
HCT: 41.4 % (ref 36.0–46.0)
Hemoglobin: 13.1 g/dL (ref 12.0–15.0)
Immature Granulocytes: 0 %
Lymphocytes Relative: 31 %
Lymphs Abs: 1.9 10*3/uL (ref 0.7–4.0)
MCH: 28.7 pg (ref 26.0–34.0)
MCHC: 31.6 g/dL (ref 30.0–36.0)
MCV: 90.8 fL (ref 80.0–100.0)
Monocytes Absolute: 0.4 10*3/uL (ref 0.1–1.0)
Monocytes Relative: 6 %
Neutro Abs: 3.4 10*3/uL (ref 1.7–7.7)
Neutrophils Relative %: 58 %
Platelets: 268 10*3/uL (ref 150–400)
RBC: 4.56 MIL/uL (ref 3.87–5.11)
RDW: 13.4 % (ref 11.5–15.5)
WBC: 5.9 10*3/uL (ref 4.0–10.5)
nRBC: 0 % (ref 0.0–0.2)

## 2023-04-23 SURGERY — PARATHYROIDECTOMY
Anesthesia: General | Laterality: Right

## 2023-04-23 MED ORDER — ROCURONIUM BROMIDE 10 MG/ML (PF) SYRINGE
PREFILLED_SYRINGE | INTRAVENOUS | Status: AC
  Filled 2023-04-23: qty 10

## 2023-04-23 MED ORDER — MIDAZOLAM HCL 2 MG/2ML IJ SOLN
INTRAMUSCULAR | Status: DC | PRN
  Administered 2023-04-23: 2 mg via INTRAVENOUS

## 2023-04-23 MED ORDER — FENTANYL CITRATE (PF) 100 MCG/2ML IJ SOLN
INTRAMUSCULAR | Status: AC
  Filled 2023-04-23: qty 2

## 2023-04-23 MED ORDER — BUPIVACAINE HCL 0.25 % IJ SOLN
INTRAMUSCULAR | Status: DC | PRN
  Administered 2023-04-23: 7 mL

## 2023-04-23 MED ORDER — PHENYLEPHRINE 80 MCG/ML (10ML) SYRINGE FOR IV PUSH (FOR BLOOD PRESSURE SUPPORT)
PREFILLED_SYRINGE | INTRAVENOUS | Status: DC | PRN
  Administered 2023-04-23: 40 ug via INTRAVENOUS

## 2023-04-23 MED ORDER — HYDROMORPHONE HCL 1 MG/ML IJ SOLN
INTRAMUSCULAR | Status: AC
  Filled 2023-04-23: qty 1

## 2023-04-23 MED ORDER — CHLORHEXIDINE GLUCONATE CLOTH 2 % EX PADS: 6.0000 | MEDICATED_PAD | Freq: Once | CUTANEOUS | Status: DC

## 2023-04-23 MED ORDER — ORAL CARE MOUTH RINSE: 15.0000 mL | Freq: Once | OROMUCOSAL | Status: AC

## 2023-04-23 MED ORDER — PROPOFOL 10 MG/ML IV BOLUS
INTRAVENOUS | Status: DC | PRN
  Administered 2023-04-23: 140 mg via INTRAVENOUS

## 2023-04-23 MED ORDER — PROPOFOL 1000 MG/100ML IV EMUL
INTRAVENOUS | Status: AC
  Filled 2023-04-23: qty 100

## 2023-04-23 MED ORDER — SCOPOLAMINE 1 MG/3DAYS TD PT72
MEDICATED_PATCH | TRANSDERMAL | Status: DC | PRN
  Administered 2023-04-23: 1 via TRANSDERMAL

## 2023-04-23 MED ORDER — LACTATED RINGERS IV SOLN: INTRAVENOUS | Status: DC

## 2023-04-23 MED ORDER — AMISULPRIDE (ANTIEMETIC) 5 MG/2ML IV SOLN
10.0000 mg | Freq: Once | INTRAVENOUS | Status: AC
  Administered 2023-04-23: 10 mg via INTRAVENOUS

## 2023-04-23 MED ORDER — DEXAMETHASONE SODIUM PHOSPHATE 10 MG/ML IJ SOLN
INTRAMUSCULAR | Status: DC | PRN
  Administered 2023-04-23: 8 mg via INTRAVENOUS

## 2023-04-23 MED ORDER — ONDANSETRON HCL 4 MG/2ML IJ SOLN
INTRAMUSCULAR | Status: AC
Start: 2023-04-23 — End: ?
  Filled 2023-04-23: qty 2

## 2023-04-23 MED ORDER — FENTANYL CITRATE (PF) 100 MCG/2ML IJ SOLN
INTRAMUSCULAR | Status: DC | PRN
  Administered 2023-04-23: 100 ug via INTRAVENOUS
  Administered 2023-04-23: 50 ug via INTRAVENOUS

## 2023-04-23 MED ORDER — PROPOFOL 10 MG/ML IV BOLUS
INTRAVENOUS | Status: AC
  Filled 2023-04-23: qty 20

## 2023-04-23 MED ORDER — LIDOCAINE HCL (PF) 2 % IJ SOLN
INTRAMUSCULAR | Status: AC
  Filled 2023-04-23: qty 5

## 2023-04-23 MED ORDER — LIDOCAINE 2% (20 MG/ML) 5 ML SYRINGE
INTRAMUSCULAR | Status: DC | PRN
  Administered 2023-04-23: 90 mg via INTRAVENOUS

## 2023-04-23 MED ORDER — ONDANSETRON HCL 4 MG/2ML IJ SOLN
INTRAMUSCULAR | Status: DC | PRN
  Administered 2023-04-23: 4 mg via INTRAVENOUS

## 2023-04-23 MED ORDER — HYDROMORPHONE HCL 1 MG/ML IJ SOLN
0.2500 mg | INTRAMUSCULAR | Status: DC | PRN
  Administered 2023-04-23 (×5): 0.5 mg via INTRAVENOUS

## 2023-04-23 MED ORDER — ACETAMINOPHEN 500 MG PO TABS
1000.0000 mg | ORAL_TABLET | Freq: Once | ORAL | Status: AC
  Administered 2023-04-23: 1000 mg via ORAL
  Filled 2023-04-23: qty 2

## 2023-04-23 MED ORDER — HEMOSTATIC AGENTS (NO CHARGE) OPTIME
TOPICAL | Status: DC | PRN
  Administered 2023-04-23: 1 via TOPICAL

## 2023-04-23 MED ORDER — HYDROMORPHONE HCL 1 MG/ML IJ SOLN: 1.0000 mg | INTRAMUSCULAR | Status: DC | PRN

## 2023-04-23 MED ORDER — HYDROMORPHONE HCL 1 MG/ML IJ SOLN: 0.5000 mg | INTRAMUSCULAR | Status: DC | PRN

## 2023-04-23 MED ORDER — MIDAZOLAM HCL 2 MG/2ML IJ SOLN
INTRAMUSCULAR | Status: AC
  Filled 2023-04-23: qty 2

## 2023-04-23 MED ORDER — BUPIVACAINE HCL 0.25 % IJ SOLN
INTRAMUSCULAR | Status: AC
  Filled 2023-04-23: qty 1

## 2023-04-23 MED ORDER — DROPERIDOL 2.5 MG/ML IJ SOLN
0.6250 mg | Freq: Once | INTRAMUSCULAR | Status: AC
  Administered 2023-04-23: 0.625 mg via INTRAVENOUS

## 2023-04-23 MED ORDER — DEXAMETHASONE SODIUM PHOSPHATE 10 MG/ML IJ SOLN
INTRAMUSCULAR | Status: AC
  Filled 2023-04-23: qty 1

## 2023-04-23 MED ORDER — SUGAMMADEX SODIUM 200 MG/2ML IV SOLN
INTRAVENOUS | Status: DC | PRN
  Administered 2023-04-23: 200 mg via INTRAVENOUS

## 2023-04-23 MED ORDER — SCOPOLAMINE 1 MG/3DAYS TD PT72
MEDICATED_PATCH | TRANSDERMAL | Status: AC
  Filled 2023-04-23: qty 1

## 2023-04-23 MED ORDER — 0.9 % SODIUM CHLORIDE (POUR BTL) OPTIME
TOPICAL | Status: DC | PRN
  Administered 2023-04-23: 500 mL

## 2023-04-23 MED ORDER — AMISULPRIDE (ANTIEMETIC) 5 MG/2ML IV SOLN
INTRAVENOUS | Status: AC
  Filled 2023-04-23: qty 4

## 2023-04-23 MED ORDER — HYDROMORPHONE HCL 1 MG/ML IJ SOLN
INTRAMUSCULAR | Status: AC
  Administered 2023-04-23: 0.5 mg via INTRAVENOUS
  Filled 2023-04-23: qty 1

## 2023-04-23 MED ORDER — DROPERIDOL 2.5 MG/ML IJ SOLN: 0.6250 mg | Freq: Once | INTRAMUSCULAR | Status: DC

## 2023-04-23 MED ORDER — OXYCODONE HCL 5 MG PO TABS: 5.0000 mg | ORAL_TABLET | Freq: Four times a day (QID) | ORAL | 0 refills | Status: DC | PRN

## 2023-04-23 MED ORDER — CEFAZOLIN SODIUM-DEXTROSE 2-4 GM/100ML-% IV SOLN
2.0000 g | INTRAVENOUS | Status: AC
  Administered 2023-04-23: 2 g via INTRAVENOUS
  Filled 2023-04-23: qty 100

## 2023-04-23 MED ORDER — ROCURONIUM BROMIDE 10 MG/ML (PF) SYRINGE
PREFILLED_SYRINGE | INTRAVENOUS | Status: DC | PRN
  Administered 2023-04-23: 70 mg via INTRAVENOUS

## 2023-04-23 MED ORDER — CHLORHEXIDINE GLUCONATE 0.12 % MT SOLN
15.0000 mL | Freq: Once | OROMUCOSAL | Status: AC
Start: 2023-04-23 — End: 2023-04-23
  Administered 2023-04-23: 15 mL via OROMUCOSAL

## 2023-04-23 MED ORDER — DROPERIDOL 2.5 MG/ML IJ SOLN
INTRAMUSCULAR | Status: AC
  Filled 2023-04-23: qty 2

## 2023-04-23 SURGICAL SUPPLY — 35 items
ADH SKN CLS APL DERMABOND .7 (GAUZE/BANDAGES/DRESSINGS) ×1
APL PRP STRL LF DISP 70% ISPRP (MISCELLANEOUS) ×1
ATTRACTOMAT 16X20 MAGNETIC DRP (DRAPES) ×2 IMPLANT
BAG COUNTER SPONGE SURGICOUNT (BAG) ×2 IMPLANT
BAG SPNG CNTER NS LX DISP (BAG) ×1
BLADE SURG 15 STRL LF DISP TIS (BLADE) ×2 IMPLANT
BLADE SURG 15 STRL SS (BLADE) ×1
CHLORAPREP W/TINT 26 (MISCELLANEOUS) ×2 IMPLANT
CLIP TI MEDIUM 6 (CLIP) ×4 IMPLANT
CLIP TI WIDE RED SMALL 6 (CLIP) ×4 IMPLANT
COVER SURGICAL LIGHT HANDLE (MISCELLANEOUS) ×2 IMPLANT
DERMABOND ADVANCED .7 DNX12 (GAUZE/BANDAGES/DRESSINGS) ×2 IMPLANT
DRAPE LAPAROTOMY T 98X78 PEDS (DRAPES) ×2 IMPLANT
DRAPE UTILITY XL STRL (DRAPES) ×2 IMPLANT
ELECT REM PT RETURN 15FT ADLT (MISCELLANEOUS) ×2 IMPLANT
GAUZE 4X4 16PLY ~~LOC~~+RFID DBL (SPONGE) ×2 IMPLANT
GLOVE SURG ORTHO 8.0 STRL STRW (GLOVE) ×2 IMPLANT
GOWN STRL REUS W/ TWL XL LVL3 (GOWN DISPOSABLE) ×6 IMPLANT
GOWN STRL REUS W/TWL XL LVL3 (GOWN DISPOSABLE) ×3
HEMOSTAT SURGICEL 2X4 FIBR (HEMOSTASIS) ×2 IMPLANT
ILLUMINATOR WAVEGUIDE N/F (MISCELLANEOUS) IMPLANT
KIT BASIN OR (CUSTOM PROCEDURE TRAY) ×2 IMPLANT
KIT TURNOVER KIT A (KITS) IMPLANT
NDL HYPO 22X1.5 SAFETY MO (MISCELLANEOUS) ×2 IMPLANT
NEEDLE HYPO 22X1.5 SAFETY MO (MISCELLANEOUS) ×1
PACK BASIC VI WITH GOWN DISP (CUSTOM PROCEDURE TRAY) ×2 IMPLANT
PENCIL SMOKE EVACUATOR (MISCELLANEOUS) ×2 IMPLANT
SHEARS HARMONIC 9CM CVD (BLADE) IMPLANT
SUT MNCRL AB 4-0 PS2 18 (SUTURE) ×2 IMPLANT
SUT VIC AB 3-0 SH 18 (SUTURE) ×2 IMPLANT
SYR BULB IRRIG 60ML STRL (SYRINGE) ×2 IMPLANT
SYR CONTROL 10ML LL (SYRINGE) ×2 IMPLANT
TOWEL OR 17X26 10 PK STRL BLUE (TOWEL DISPOSABLE) ×2 IMPLANT
TOWEL OR NON WOVEN STRL DISP B (DISPOSABLE) ×2 IMPLANT
TUBING CONNECTING 10 (TUBING) ×2 IMPLANT

## 2023-04-23 NOTE — Anesthesia Postprocedure Evaluation (Signed)
Anesthesia Post Note  Patient: Laurie Kelley  Procedure(s) Performed: RIGHT INFERIOR PARATHYROIDECTOMY (Right)     Patient location during evaluation: PACU Anesthesia Type: General Level of consciousness: awake and alert Pain management: pain level controlled Vital Signs Assessment: post-procedure vital signs reviewed and stable Respiratory status: spontaneous breathing, nonlabored ventilation and respiratory function stable Cardiovascular status: blood pressure returned to baseline and stable Postop Assessment: no apparent nausea or vomiting Anesthetic complications: yes  Encounter Notable Events  Notable Event Outcome Phase Comment  Difficult to intubate - expected  Intraprocedure Filed from anesthesia note documentation.    Last Vitals:  Vitals:   04/23/23 1500 04/23/23 1515  BP: 125/61 116/66  Pulse: 64 60  Resp: 14 14  Temp:  (!) 36 C  SpO2: 95% 94%    Last Pain:  Vitals:   04/23/23 1515  TempSrc:   PainSc: Asleep                 Alyzae Hawkey,W. EDMOND

## 2023-04-23 NOTE — Anesthesia Preprocedure Evaluation (Addendum)
Anesthesia Evaluation  Patient identified by MRN, date of birth, ID band Patient awake    Reviewed: Allergy & Precautions, H&P , NPO status , Patient's Chart, lab work & pertinent test results  History of Anesthesia Complications (+) PONV and history of anesthetic complications  Airway Mallampati: III  TM Distance: >3 FB Neck ROM: Full  Mouth opening: Limited Mouth Opening  Dental no notable dental hx. (+) Teeth Intact, Dental Advisory Given   Pulmonary neg pulmonary ROS   Pulmonary exam normal breath sounds clear to auscultation       Cardiovascular negative cardio ROS  Rhythm:Regular Rate:Normal     Neuro/Psych  Headaches  negative psych ROS   GI/Hepatic Neg liver ROS,GERD  Medicated,,  Endo/Other  negative endocrine ROS    Renal/GU negative Renal ROS  negative genitourinary   Musculoskeletal   Abdominal   Peds  Hematology negative hematology ROS (+)   Anesthesia Other Findings   Reproductive/Obstetrics negative OB ROS                             Anesthesia Physical Anesthesia Plan  ASA: 2  Anesthesia Plan: General   Post-op Pain Management: Tylenol PO (pre-op)*   Induction: Intravenous  PONV Risk Score and Plan: 4 or greater and Ondansetron, Dexamethasone and Midazolam  Airway Management Planned: Oral ETT and Video Laryngoscope Planned  Additional Equipment:   Intra-op Plan:   Post-operative Plan: Extubation in OR  Informed Consent: I have reviewed the patients History and Physical, chart, labs and discussed the procedure including the risks, benefits and alternatives for the proposed anesthesia with the patient or authorized representative who has indicated his/her understanding and acceptance.     Dental advisory given  Plan Discussed with: CRNA  Anesthesia Plan Comments:        Anesthesia Quick Evaluation

## 2023-04-23 NOTE — Op Note (Signed)
OPERATIVE REPORT - PARATHYROIDECTOMY  Preoperative diagnosis: Primary hyperparathyroidism  Postop diagnosis: Same  Procedure: Right minimally invasive parathyroidectomy  Surgeon:  Darnell Level, MD  Anesthesia: General endotracheal  Estimated blood loss: Minimal  Preparation: ChloraPrep  Indications: Patient is referred by Dr. Marquis Lunch for surgical evaluation and management of newly diagnosed primary hyperparathyroidism. Patient gives a history of hypercalcemia dating back as long as 2016. Recent calcium levels have ranged as high as 11.9. Intact PTH was measured at 122. 25-hydroxy vitamin D level was normal at 62.9. Patient underwent a 24-hour urine collection for calcium which was within the normal range at 247. Patient has been symptomatic. Nuclear med sestamibi scan localized a right parathyroid adenoma.  Patient now comes to surgery for parathyroidectomy.  Procedure: The patient was prepared in the pre-operative holding area. The patient was brought to the operating room and placed in a supine position on the operating room table. Following administration of general anesthesia, the patient was positioned and then prepped and draped in the usual strict aseptic fashion. After ascertaining that an adequate level of anesthesia been achieved, a neck incision was made with a #15 blade. Dissection was carried through subcutaneous tissues and platysma. Hemostasis was obtained with the electrocautery. Skin flaps were developed circumferentially and a Weitlander retractor was placed for exposure.  Strap muscles were incised in the midline. Strap muscles were reflected laterally exposing the thyroid lobe. With gentle blunt dissection the thyroid lobe was mobilized.  Dissection was carried posteriorly and an enlarged parathyroid gland was identified adjacent to the esophagus and anterior to the precervical fascia.  This likely represents a superior parathyroid gland. It was gently mobilized. Vascular  structures were divided between small ligaclips. Care was taken to avoid the recurrent laryngeal nerve. The parathyroid gland was completely excised. It was submitted to pathology where frozen section confirmed hypercellular parathyroid tissue consistent with adenoma.  Neck was irrigated with warm saline and good hemostasis was noted. Fibrillar was placed in the operative field. Strap muscles were approximated in the midline with interrupted 3-0 Vicryl sutures. Platysma was closed with interrupted 3-0 Vicryl sutures. Marcaine was infiltrated circumferentially. Skin was closed with a running 4-0 Monocryl subcuticular suture. Wound was washed and dried and Dermabond was applied. Patient was awakened from anesthesia and brought to the recovery room. The patient tolerated the procedure well.   Darnell Level, MD [facility] Surgery Office: [phone]

## 2023-04-23 NOTE — Anesthesia Procedure Notes (Signed)
Procedure Name: Intubation Date/Time: 04/23/2023 1:19 PM  Performed by: Sindy Guadeloupe, CRNAPre-anesthesia Checklist: Patient identified, Emergency Drugs available, Suction available, Patient being monitored and Timeout performed Patient Re-evaluated:Patient Re-evaluated prior to induction Oxygen Delivery Method: Circle system utilized Preoxygenation: Pre-oxygenation with 100% oxygen Induction Type: IV induction Ventilation: Mask ventilation without difficulty Laryngoscope Size: Glidescope and 3 Grade View: Grade I Tube type: Oral Tube size: 7.0 mm Number of attempts: 1 Airway Equipment and Method: Stylet Placement Confirmation: ETT inserted through vocal cords under direct vision, positive ETCO2 and breath sounds checked- equal and bilateral Secured at: 21 cm Tube secured with: Tape Dental Injury: Teeth and Oropharynx as per pre-operative assessment  Difficulty Due To: Difficulty was anticipated and Difficult Airway- due to limited oral opening

## 2023-04-23 NOTE — Interval H&P Note (Signed)
History and Physical Interval Note:  04/23/2023 12:56 PM  Laurie Kelley  has presented today for surgery, with the diagnosis of PRIMARY HYPERPARATHYROIDISM.  The various methods of treatment have been discussed with the patient and family. After consideration of risks, benefits and other options for treatment, the patient has consented to    Procedure(s): RIGHT INFERIOR PARATHYROIDECTOMY (Right) as a surgical intervention.    The patient's history has been reviewed, patient examined, no change in status, stable for surgery.  I have reviewed the patient's chart and labs.  Questions were answered to the patient's satisfaction.    Darnell Level, MD [facility] practice Office: [phone]   Darnell Level

## 2023-04-23 NOTE — Discharge Instructions (Addendum)

## 2023-04-23 NOTE — Transfer of Care (Signed)
Immediate Anesthesia Transfer of Care Note  Patient: Laurie Kelley  Procedure(s) Performed: RIGHT INFERIOR PARATHYROIDECTOMY (Right)  Patient Location: PACU  Anesthesia Type:General  Level of Consciousness: awake, drowsy, and patient cooperative  Airway & Oxygen Therapy: Patient Spontanous Breathing and Patient connected to face mask oxygen  Post-op Assessment: Report given to RN and Post -op Vital signs reviewed and stable  Post vital signs: Reviewed and stable  Last Vitals:  Vitals Value Taken Time  BP 126/74 04/23/23 1430  Temp 36.5 C 04/23/23 1430  Pulse 72 04/23/23 1434  Resp 21 04/23/23 1434  SpO2 100 % 04/23/23 1434  Vitals shown include unfiled device data.  Last Pain:  Vitals:   04/23/23 1109  TempSrc:   PainSc: 6       Patients Stated Pain Goal: 3 (04/23/23 1109)  Complications:  Encounter Notable Events  Notable Event Outcome Phase Comment  Difficult to intubate - expected  Intraprocedure Filed from anesthesia note documentation.

## 2023-04-24 ENCOUNTER — Encounter (HOSPITAL_COMMUNITY): Payer: Self-pay | Admitting: Surgery

## 2023-04-24 LAB — SURGICAL PATHOLOGY

## 2023-04-27 ENCOUNTER — Other Ambulatory Visit: Payer: Self-pay

## 2023-05-07 ENCOUNTER — Encounter: Payer: Self-pay | Admitting: Family Medicine

## 2023-05-21 ENCOUNTER — Other Ambulatory Visit: Payer: Commercial Managed Care - PPO

## 2023-05-25 ENCOUNTER — Other Ambulatory Visit: Payer: Self-pay | Admitting: Family Medicine

## 2023-05-25 NOTE — Telephone Encounter (Signed)
Gottschalk patient, last office visit with her on 02/27/23, acute visits since then with other providers Med listed as historical

## 2023-05-29 DIAGNOSIS — R4189 Other symptoms and signs involving cognitive functions and awareness: Secondary | ICD-10-CM | POA: Insufficient documentation

## 2023-06-29 ENCOUNTER — Other Ambulatory Visit: Payer: Self-pay | Admitting: Family Medicine

## 2023-06-29 MED ORDER — GABAPENTIN 600 MG PO TABS: 600.0000 mg | ORAL_TABLET | Freq: Three times a day (TID) | ORAL | 0 refills | Status: DC

## 2023-07-03 ENCOUNTER — Ambulatory Visit: Payer: Commercial Managed Care - PPO | Admitting: Family Medicine

## 2023-07-05 DIAGNOSIS — M952 Other acquired deformity of head: Secondary | ICD-10-CM | POA: Insufficient documentation

## 2023-07-06 ENCOUNTER — Ambulatory Visit (INDEPENDENT_AMBULATORY_CARE_PROVIDER_SITE_OTHER): Payer: Commercial Managed Care - PPO | Admitting: Family Medicine

## 2023-07-06 ENCOUNTER — Encounter: Payer: Self-pay | Admitting: Family Medicine

## 2023-07-06 VITALS — BP 106/57 | HR 75 | Temp 98.3°F | Ht 65.0 in | Wt 175.6 lb

## 2023-07-06 DIAGNOSIS — R413 Other amnesia: Secondary | ICD-10-CM | POA: Diagnosis not present

## 2023-07-06 DIAGNOSIS — Z1331 Encounter for screening for depression: Secondary | ICD-10-CM | POA: Diagnosis not present

## 2023-07-06 DIAGNOSIS — K529 Noninfective gastroenteritis and colitis, unspecified: Secondary | ICD-10-CM

## 2023-07-06 DIAGNOSIS — R519 Headache, unspecified: Secondary | ICD-10-CM | POA: Diagnosis not present

## 2023-07-06 DIAGNOSIS — G8929 Other chronic pain: Secondary | ICD-10-CM

## 2023-07-06 NOTE — Progress Notes (Signed)
Subjective: CC:f/u neuro visit PCP: Raliegh Ip, DO ZOX:WRUEA L Oesterle is a 53 y.o. female presenting to clinic today for:  1. Memory changes She is on the wrong who did not feel that her memory changes were related to Alzheimer's disease etc.  She has had persistent and somewhat progressive short-term memory loss however.  It was felt that perhaps this may be due to an underlying depressive disorder and she recommend that she follow-up with PCP for further discussion.  Patient is amenable to starting medication at some point but still wants to give it a couple more months to make this decision.  She was wondering if perhaps some of this memory change may have been related to the parathyroid hormone issue she was having before and is hoping that that will be sorted out by the time she has an appoint with me in March.  She continues to have right sided facial pain.  She will be undergoing reconstructive surgery again in March.  2.  Loose stools She reports about a 109-month history of loose stools.  These are nonbloody.  She denies any associated abdominal pain, nausea or vomiting.   ROS: Per HPI  Allergies  Allergen Reactions   Gadolinium Derivatives Other (See Comments)    Pt reports she developed chest tightness, burning of her back, and weakness after MRI contrast 11/2021. Per Dr. Alfredo Batty, he suggests 13 hr protocol prior to MRI.    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   Cancer (HCC)    menigiomas brain   Chest pain in adult 07/31/2017   Family history of brain tumor    Family history of brain tumor 06/27/2021   Gallstones    Headache    Hyperlipidemia with target LDL less than 100 08/18/2015   Hyperparathyroidism (HCC)    Internal hemorrhoids 08/18/2014   Lynch syndrome    PONV  (postoperative nausea and vomiting)     Current Outpatient Medications:    atorvastatin (LIPITOR) 20 MG tablet, Take 1 tablet (20 mg total) by mouth daily., Disp: 90 tablet, Rfl: 3   Biotin 5000 MCG TABS, Take 5,000 mcg by mouth daily., Disp: , Rfl:    ciclopirox (PENLAC) 8 % solution, Apply topically at bedtime. Apply over nail and surrounding skin. Apply daily over previous coat. After seven (7) days, may remove with alcohol and continue cycle., Disp: 6.6 mL, Rfl: 0   conjugated estrogens (PREMARIN) vaginal cream, 0.5g (pea-sized amount) twice per week, Disp: 42.5 g, Rfl: 3   gabapentin (NEURONTIN) 600 MG tablet, Take 1 tablet (600 mg total) by mouth 3 (three) times daily., Disp: 90 tablet, Rfl: 0   hydrocortisone 1 % ointment, Apply 1 Application topically 2 (two) times daily as needed for itching., Disp: , Rfl:    oxyCODONE (OXY IR/ROXICODONE) 5 MG immediate release tablet, Take 1 tablet (5 mg total) by mouth every 6 (six) hours as needed for moderate pain (pain score 4-6)., Disp: 12 tablet, Rfl: 0   Probiotic Product (PROBIOTIC DAILY PO), Take 1 tablet by mouth daily. Olly, Disp: , Rfl:  Social History   Socioeconomic History   Marital status: Divorced    Spouse name: Not on file   Number of children: 2   Years of education: Not on file   Highest  education level: Associate degree: occupational, Scientist, product/process development, or vocational program  Occupational History   Not on file  Tobacco Use   Smoking status: Never   Smokeless tobacco: Never  Vaping Use   Vaping status: Never Used  Substance and Sexual Activity   Alcohol use: No   Drug use: Never   Sexual activity: Yes    Birth control/protection: Surgical    Comment: hysterectomy  Other Topics Concern   Not on file  Social History Narrative   Not on file   Social Drivers of Health   Financial Resource Strain: Low Risk  (06/29/2023)   Overall Financial Resource Strain (CARDIA)    Difficulty of Paying Living Expenses: Not hard at all   Food Insecurity: No Food Insecurity (06/29/2023)   Hunger Vital Sign    Worried About Running Out of Food in the Last Year: Never true    Ran Out of Food in the Last Year: Never true  Transportation Needs: No Transportation Needs (06/29/2023)   PRAPARE - Administrator, Civil Service (Medical): No    Lack of Transportation (Non-Medical): No  Physical Activity: Unknown (06/29/2023)   Exercise Vital Sign    Days of Exercise per Week: 3 days    Minutes of Exercise per Session: Patient declined  Stress: Patient Declined (06/29/2023)   [employer] of Occupational Health - Occupational Stress Questionnaire    Feeling of Stress : Patient declined  Recent Concern: Stress - Stress Concern Present (04/05/2023)   [place] [employer] of Occupational Health - Occupational Stress Questionnaire    Feeling of Stress : To some extent  Social Connections: Socially Isolated (06/29/2023)   Social Connection and Isolation Panel [NHANES]    Frequency of Communication with Friends and Family: More than three times a week    Frequency of Social Gatherings with Friends and Family: Once a week    Attends Religious Services: Never    Database administrator or Organizations: No    Attends Engineer, structural: Not on file    Marital Status: Divorced  Intimate Partner Violence: Not At Risk (12/29/2022)   Received from [facility]   HITS    Over the last 12 months how often did your partner physically hurt you?: Never    Over the last 12 months how often did your partner insult you or talk down to you?: Never    Over the last 12 months how often did your partner threaten you with physical harm?: Never    Over the last 12 months how often did your partner scream or curse at you?: Never   Family History  Problem Relation Age of Onset   Cancer Mother    Hyperlipidemia Mother    Lung cancer Mother    Hypertension Father    Heart disease Father    Other Sister        meningiomas/tumors  x11   Other Daughter        Lynch syndrome   Brain cancer Maternal Aunt        d. 43s-60s   Lung cancer Maternal Aunt    Bone cancer Maternal Aunt        d. 50s-60s   Liver cancer Paternal Uncle    Lung cancer Paternal Grandmother        d.85   Rectal cancer Neg Hx    Esophageal cancer Neg Hx    Stomach cancer Neg Hx    Breast cancer Neg Hx    Colon  polyps Neg Hx    Colon cancer Neg Hx     Objective: Office vital signs reviewed. BP (!) 106/57   Pulse 75   Temp 98.3 F (36.8 C)   Ht 5\' 5"  (1.651 m)   Wt 175 lb 9.6 oz (79.7 kg)   SpO2 100%   BMI 29.22 kg/m   Physical Examination:  General: Awake, alert, nontoxic-appearing female, No acute distress HEENT: Sclera white.  Moist mucous membranes Cardio: regular rate and rhythm  Pulm: normal work of breathing on room air GI: Flat, soft.  Mild generalized periumbilical tenderness without rebound or guarding     07/06/2023    2:18 PM 03/29/2023   10:10 AM 02/27/2023    8:16 AM  Depression screen PHQ 2/9  Decreased Interest 2 0 0  Down, Depressed, Hopeless 0  0  PHQ - 2 Score 2 0 0  Altered sleeping 0 0 1  Tired, decreased energy 2 2 2   Change in appetite 2 0 0  Feeling bad or failure about yourself  1 0 0  Trouble concentrating 1 1 1   Moving slowly or fidgety/restless 1 0 1  Suicidal thoughts 0 0 0  PHQ-9 Score 9 3 5   Difficult doing work/chores Somewhat difficult Extremely dIfficult Extremely dIfficult      07/06/2023    2:21 PM 07/06/2023    2:12 PM 03/29/2023   10:10 AM 02/27/2023    8:16 AM  GAD 7 : Generalized Anxiety Score  Nervous, Anxious, on Edge 1 0 1 1  Control/stop worrying 1 0 1 1  Worry too much - different things 1 0 1 1  Trouble relaxing 1 0 0 1  Restless 1 0 0 0  Easily annoyed or irritable 0 0 2 1  Afraid - awful might happen 2 0 3 3  Total GAD 7 Score 7 0 8 8  Anxiety Difficulty Very difficult  Extremely difficult Extremely difficult    Assessment/ Plan: 53 y.o. female   Short-term  memory loss  Positive screening for depression on 2-item Patient Health Questionnaire (PHQ-2)  Chronic secondary facial pain  Chronic diarrhea - Plan: Basic Metabolic Panel, TSH + free T4, Fecal fat, qualitative, Pancreatic Elastase, Fecal, Cdiff NAA+O+P+Stool Culture, Fecal occult blood, imunochemical([facility])  We discussed consideration for Cymbalta for treatment of anxiety and depression.  She did score a 9 here on the PHQ.  She wishes to defer this for now.  I think this choice may be of benefit to her from a pain standpoint.  We discussed consideration for wean from gabapentin at some point should she desire  I am going to collect electrolytes and thyroid levels on her given chronic diarrhea.  Will proceed with stool studies.  Keep appointment in March.  May consider switching to virtual if needed after surgery   Raliegh Ip, DO [facility] [phone]

## 2023-07-07 LAB — BASIC METABOLIC PANEL
BUN/Creatinine Ratio: 17 (ref 9–23)
BUN: 14 mg/dL (ref 6–24)
CO2: 22 mmol/L (ref 20–29)
Calcium: 10.3 mg/dL — ABNORMAL HIGH (ref 8.7–10.2)
Chloride: 106 mmol/L (ref 96–106)
Creatinine, Ser: 0.84 mg/dL (ref 0.57–1.00)
Glucose: 101 mg/dL — ABNORMAL HIGH (ref 70–99)
Potassium: 4.4 mmol/L (ref 3.5–5.2)
Sodium: 139 mmol/L (ref 134–144)
eGFR: 85 mL/min/{1.73_m2} (ref >59)

## 2023-07-07 LAB — TSH+FREE T4
Free T4: 0.98 ng/dL (ref 0.82–1.77)
TSH: 0.88 u[IU]/mL (ref 0.450–4.500)

## 2023-07-09 ENCOUNTER — Encounter: Payer: Self-pay | Admitting: Family Medicine

## 2023-07-16 ENCOUNTER — Other Ambulatory Visit: Payer: Commercial Managed Care - PPO

## 2023-07-17 LAB — FECAL OCCULT BLOOD, IMMUNOCHEMICAL: Fecal Occult Bld: NEGATIVE

## 2023-07-17 LAB — PANCREATIC ELASTASE, FECAL: Pancreatic Elastase, Fecal: >800 ug Elast./g (ref >200)

## 2023-07-20 LAB — CDIFF NAA+O+P+STOOL CULTURE
E coli, Shiga toxin Assay: NEGATIVE
Toxigenic C. Difficile by PCR: NEGATIVE

## 2023-07-20 LAB — FECAL FAT, QUALITATIVE
Fat Qual Neutral, Stl: NORMAL
Fat Qual Total, Stl: NORMAL

## 2023-07-23 ENCOUNTER — Telehealth: Payer: Self-pay

## 2023-07-23 NOTE — Telephone Encounter (Signed)
Spoke with pt. Pt don't need Premarin vaginal cream. She is not having sex so not having issues. JSY

## 2023-07-23 NOTE — Telephone Encounter (Signed)
Laurie Kelley from [facility] ([phone]) called nurse line regarding charge of therapy authorization with ID [id number]. Called to ensure that fax was received.   This RN called [facility] regarding this VM. Staff wanted to ensure we received fax. RN informed that this patient gets care at [facility]. Provided [facility] phone number [phone].   Will route this message to the inbasket.

## 2023-07-23 NOTE — Telephone Encounter (Signed)
Left message @ 2:47 pm, for pt to return call. JSY

## 2023-07-29 ENCOUNTER — Other Ambulatory Visit: Payer: Self-pay | Admitting: Family Medicine

## 2023-08-27 ENCOUNTER — Other Ambulatory Visit (HOSPITAL_COMMUNITY)
Admission: RE | Admit: 2023-08-27 | Discharge: 2023-08-27 | Disposition: A | Discharge status: Home / Self Care | Source: Ambulatory Visit | Attending: Family Medicine | Admitting: Family Medicine

## 2023-08-27 ENCOUNTER — Ambulatory Visit (INDEPENDENT_AMBULATORY_CARE_PROVIDER_SITE_OTHER): Payer: Commercial Managed Care - PPO | Admitting: Family Medicine

## 2023-08-27 ENCOUNTER — Encounter: Payer: Self-pay | Admitting: Family Medicine

## 2023-08-27 VITALS — BP 120/61 | HR 72 | Temp 98.6°F | Ht 65.0 in | Wt 171.6 lb

## 2023-08-27 DIAGNOSIS — N3001 Acute cystitis with hematuria: Secondary | ICD-10-CM

## 2023-08-27 DIAGNOSIS — Z8603 Personal history of neoplasm of uncertain behavior: Secondary | ICD-10-CM

## 2023-08-27 DIAGNOSIS — N898 Other specified noninflammatory disorders of vagina: Secondary | ICD-10-CM

## 2023-08-27 DIAGNOSIS — R519 Headache, unspecified: Secondary | ICD-10-CM

## 2023-08-27 DIAGNOSIS — F419 Anxiety disorder, unspecified: Secondary | ICD-10-CM | POA: Diagnosis not present

## 2023-08-27 DIAGNOSIS — F32A Depression, unspecified: Secondary | ICD-10-CM

## 2023-08-27 DIAGNOSIS — Z113 Encounter for screening for infections with a predominantly sexual mode of transmission: Secondary | ICD-10-CM | POA: Insufficient documentation

## 2023-08-27 DIAGNOSIS — Z9889 Other specified postprocedural states: Secondary | ICD-10-CM

## 2023-08-27 LAB — URINALYSIS, ROUTINE W REFLEX MICROSCOPIC
Bilirubin, UA: NEGATIVE
Glucose, UA: NEGATIVE
Ketones, UA: NEGATIVE
Nitrite, UA: POSITIVE — AB
Specific Gravity, UA: >1.03 — ABNORMAL HIGH (ref 1.005–1.030)
Urobilinogen, Ur: 0.2 mg/dL (ref 0.2–1.0)
pH, UA: 5.5 (ref 5.0–7.5)

## 2023-08-27 LAB — WET PREP FOR TRICH, YEAST, CLUE
Clue Cell Exam: NEGATIVE
Trichomonas Exam: NEGATIVE
Yeast Exam: NEGATIVE

## 2023-08-27 LAB — MICROSCOPIC EXAMINATION
Renal Epithel, UA: NONE SEEN /HPF
WBC, UA: >30 /HPF — AB (ref 0–5)
Yeast, UA: NONE SEEN

## 2023-08-27 MED ORDER — GABAPENTIN 600 MG PO TABS: 600.0000 mg | ORAL_TABLET | Freq: Three times a day (TID) | ORAL | 4 refills | Status: DC

## 2023-08-27 MED ORDER — FLUCONAZOLE 150 MG PO TABS: 150.0000 mg | ORAL_TABLET | Freq: Once | ORAL | 0 refills | Status: AC

## 2023-08-27 MED ORDER — DULOXETINE HCL 60 MG PO CPEP: 60.0000 mg | ORAL_CAPSULE | Freq: Every day | ORAL | 3 refills | Status: DC

## 2023-08-27 MED ORDER — CEPHALEXIN 500 MG PO CAPS: 500.0000 mg | ORAL_CAPSULE | Freq: Two times a day (BID) | ORAL | 0 refills | Status: AC

## 2023-08-27 NOTE — Patient Instructions (Signed)
Taking the medicine as directed and not missing any doses is one of the best things you can do to treat your depression.  Here are some things to keep in mind:  Side effects (stomach upset, some increased anxiety) may happen before you notice a benefit.  These side effects typically go away over time. Changes to your dose of medicine or a change in medication all together is sometimes necessary Most people need to be on medication at least 12 months Many people will notice an improvement within two weeks but the full effect of the medication can take up to 4-6 weeks Stopping the medication when you start feeling better often results in a return of symptoms Never discontinue your medication without contacting a health care professional first.  Some medications require gradual discontinuation/ taper and can make you sick if you stop them abruptly.  If your symptoms worsen or you have thoughts of suicide/homicide, PLEASE SEEK IMMEDIATE MEDICAL ATTENTION.  You may always call:  National Suicide Hotline: 7542058722 [place]: [phone] Crisis Recovery in [place]: 2548116572   These are available 24 hours a day, 7 days a week.

## 2023-08-27 NOTE — Progress Notes (Signed)
 Subjective: CC: Bleeding PCP: Laurie Ip, DO [name] is a 53 y.o. female presenting to clinic today for:  1.  Hematuria Patient reports that she developed blood in her urine on Saturday morning and some slight back pain.  She also notes that she has been having some vaginal irritation again and wants to be swabbed for this.  She is newly sexually active as of last fall.  She has had unprotected intercourse x 3.  No known exposures to STIs but would like to be tested for this.  2.  Memory issues, chronic facial nerve pain History of meningioma resection.  She reports that she continues to have some right-sided facial pain and I was did not pass her vision screening for her license recently.  She is compliant with gabapentin and needs a refill on this.  Would like to go ahead and proceed with addition of Cymbalta after being evaluated by neurology who felt that her memory loss may be in fact related to unrecognized depression and/or anxiety.  She continues to have memory issues.   ROS: Per HPI  Allergies  Allergen Reactions   Gadolinium Derivatives Other (See Comments)    Pt reports she developed chest tightness, burning of her back, and weakness after MRI contrast 11/2021. Per Dr. Alfredo Batty, he suggests 13 hr protocol prior to MRI.    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   Cancer (HCC)    menigiomas brain   Chest pain in adult 07/31/2017   Family history of brain tumor    Family history of brain tumor 06/27/2021   Gallstones    Headache    Hyperlipidemia with target LDL less than 100 08/18/2015   Hyperparathyroidism (HCC)    Internal hemorrhoids 08/18/2014   Lynch syndrome    PONV (postoperative nausea and vomiting)     Current Outpatient Medications:    atorvastatin (LIPITOR) 20  MG tablet, Take 1 tablet (20 mg total) by mouth daily., Disp: 90 tablet, Rfl: 3   Biotin 5000 MCG TABS, Take 5,000 mcg by mouth daily., Disp: , Rfl:    ciclopirox (PENLAC) 8 % solution, Apply topically at bedtime. Apply over nail and surrounding skin. Apply daily over previous coat. After seven (7) days, may remove with alcohol and continue cycle., Disp: 6.6 mL, Rfl: 0   gabapentin (NEURONTIN) 600 MG tablet, TAKE 1 TABLET BY MOUTH THREE TIMES A DAY, Disp: 90 tablet, Rfl: 0   hydrocortisone 1 % ointment, Apply 1 Application topically 2 (two) times daily as needed for itching., Disp: , Rfl:    Probiotic Product (PROBIOTIC DAILY PO), Take 1 tablet by mouth daily. Olly, Disp: , Rfl:  Social History   Socioeconomic History   Marital status: Divorced    Spouse name: Not on file   Number of children: 2   Years of education: Not on file   Highest education level: Associate degree: occupational, Scientist, product/process development, or vocational program  Occupational History   Not on file  Tobacco Use   Smoking status: Never   Smokeless tobacco: Never  Vaping Use   Vaping status: Never Used  Substance and Sexual Activity   Alcohol use: No   Drug use: Never   Sexual activity: Yes    Birth control/protection: Surgical    Comment: hysterectomy  Other Topics Concern   Not on file  Social History Narrative   Not on file   Social Drivers of Health   Financial Resource Strain: Low Risk  (08/13/2023)   [facility]   Overall Financial Resource Strain (CARDIA)    Difficulty of Paying Living Expenses: Not hard at all  Food Insecurity: No Food Insecurity (08/13/2023)   Received from [facility]   Hunger Vital Sign    Worried About Running Out of Food in the Last Year: Never true    Ran Out of Food in the Last Year: Never true  Transportation Needs: No Transportation Needs (08/13/2023)   Received from [facility] - Transportation    Lack of Transportation (Medical): No    Lack of  Transportation (Non-Medical): No  Physical Activity: Unknown (06/29/2023)   Exercise Vital Sign    Days of Exercise per Week: 3 days    Minutes of Exercise per Session: Patient declined  Stress: Patient Declined (06/29/2023)   [employer] of Occupational Health - Occupational Stress Questionnaire    Feeling of Stress : Patient declined  Recent Concern: Stress - Stress Concern Present (04/05/2023)   [employer] [employer] of Occupational Health - Occupational Stress Questionnaire    Feeling of Stress : To some extent  Social Connections: Socially Isolated (06/29/2023)   Social Connection and Isolation Panel [NHANES]    Frequency of Communication with Friends and Family: More than three times a week    Frequency of Social Gatherings with Friends and Family: Once a week    Attends Religious Services: Never    Database administrator or Organizations: No    Attends Engineer, structural: Not on file    Marital Status: Divorced  Intimate Partner Violence: Not At Risk (12/29/2022)   Received from [facility]   HITS    Over the last 12 months how often did your partner physically hurt you?: Never    Over the last 12 months how often did your partner insult you or talk down to you?: Never    Over the last 12 months how often did your partner threaten you with physical harm?: Never    Over the last 12 months how often did your partner scream or curse at you?: Never   Family History  Problem Relation Age of Onset   Cancer Mother    Hyperlipidemia Mother    Lung cancer Mother    Hypertension Father    Heart disease Father    Other Sister        meningiomas/tumors x11   Other Daughter        Lynch syndrome   Brain cancer Maternal Aunt        d. 60s-60s   Lung cancer Maternal Aunt    Bone cancer Maternal Aunt        d. 50s-60s   Liver cancer Paternal Uncle    Lung cancer Paternal Grandmother        d.85   Rectal cancer Neg Hx    Esophageal cancer Neg Hx    Stomach cancer Neg  Hx    Breast cancer Neg Hx    Colon polyps Neg Hx    Colon cancer Neg Hx     Objective: Office vital signs reviewed. BP 120/61   Pulse 72   Temp 98.6 F (37 C)   Ht 5\' 5"  (1.651 m)   Wt 171 lb 9.6 oz (77.8 kg)   SpO2 100%  BMI 28.56 kg/m   Physical Examination:  General: Awake, alert, well nourished, No acute distress HEENT: sclera white, MMM GU: external vaginal tissue normal, cervix appears to be surgically absent, no bleeding, no abdominal/ adnexal masses     08/27/2023   10:59 AM 07/06/2023    2:18 PM 03/29/2023   10:10 AM  Depression screen PHQ 2/9  Decreased Interest 1 2 0  Down, Depressed, Hopeless  0   PHQ - 2 Score 1 2 0  Altered sleeping  0 0  Tired, decreased energy  2 2  Change in appetite  2 0  Feeling bad or failure about yourself   1 0  Trouble concentrating  1 1  Moving slowly or fidgety/restless  1 0  Suicidal thoughts  0 0  PHQ-9 Score  9 3  Difficult doing work/chores  Somewhat difficult Extremely dIfficult      08/27/2023   10:59 AM 07/06/2023    2:21 PM 07/06/2023    2:12 PM 03/29/2023   10:10 AM  GAD 7 : Generalized Anxiety Score  Nervous, Anxious, on Edge 1 1 0 1  Control/stop worrying 1 1 0 1  Worry too much - different things 1 1 0 1  Trouble relaxing 1 1 0 0  Restless 1 1 0 0  Easily annoyed or irritable 0 0 0 2  Afraid - awful might happen 1 2 0 3  Total GAD 7 Score 6 7 0 8  Anxiety Difficulty Very difficult Very difficult  Extremely difficult   Assessment/ Plan: 53 y.o. female   Acute cystitis with hematuria - Plan: Urinalysis, Routine w reflex microscopic, Urine Culture, cephALEXin (KEFLEX) 500 MG capsule, fluconazole (DIFLUCAN) 150 MG tablet  Vaginal irritation - Plan: WET PREP FOR TRICH, YEAST, CLUE, GC/Chlamydia probe amp ([place])not at [facility]  Screening examination for STI - Plan: GC/Chlamydia probe amp ([place])not at [facility]  Anxiety and depression - Plan: DULoxetine (CYMBALTA) 60 MG capsule  History of resection  of meningioma - Plan: DULoxetine (CYMBALTA) 60 MG capsule, gabapentin (NEURONTIN) 600 MG tablet  Right sided facial pain - Plan: DULoxetine (CYMBALTA) 60 MG capsule, gabapentin (NEURONTIN) 600 MG tablet  Urinalysis consistent with acute cystitis.  Keflex sent.  Diflucan sent for as needed yeast infection.  Her wet prep was unremarkable.  I did obtain gonorrhea chlamydia testing per her request as she has been sexually active and this has not been protected.  She has history of total abdominal hysterectomy.  She had concerns that she thought she still had a cervical cuff.  She certainly appeared to have some puckering at the surgical site that may have previously been mistaken for a cervix but she in fact does have history of TAH and BSO due to Lynch syndrome so I would be extremely surprised if cervical cuff was left behind.  I will CC Dr. Charlotta Newton as Lorain Childes patient's concerns  I did discuss with her needing to increase water intake and make sure that she eliminates her bladder both pre and postcoital.  Cymbalta added not only for mood but also to see if this might help some of the nerve pain she has been experiencing in the right face after meningioma resection  Will plan to see each other back in 6 weeks, sooner if concerns arise Recommended that she consider HIV and hepatitis C screening at some point  Laurie Ip, DO [facility] [phone]

## 2023-08-28 ENCOUNTER — Encounter: Payer: Self-pay | Admitting: Family Medicine

## 2023-08-28 LAB — GC/CHLAMYDIA PROBE AMP (~~LOC~~) NOT AT ARMC
Chlamydia: NEGATIVE
Comment: NEGATIVE
Comment: NORMAL
Neisseria Gonorrhea: NEGATIVE

## 2023-08-29 LAB — URINE CULTURE

## 2023-09-12 ENCOUNTER — Encounter: Payer: Self-pay | Admitting: Gastroenterology

## 2023-09-12 ENCOUNTER — Ambulatory Visit: Payer: Self-pay | Admitting: *Deleted

## 2023-09-12 ENCOUNTER — Encounter (INDEPENDENT_AMBULATORY_CARE_PROVIDER_SITE_OTHER): Admitting: Family Medicine

## 2023-09-12 ENCOUNTER — Ambulatory Visit (INDEPENDENT_AMBULATORY_CARE_PROVIDER_SITE_OTHER): Payer: Commercial Managed Care - PPO | Admitting: Gastroenterology

## 2023-09-12 VITALS — BP 118/86 | HR 122 | Ht 65.0 in | Wt 171.0 lb

## 2023-09-12 DIAGNOSIS — Z1509 Genetic susceptibility to other malignant neoplasm: Secondary | ICD-10-CM | POA: Diagnosis not present

## 2023-09-12 DIAGNOSIS — R195 Other fecal abnormalities: Secondary | ICD-10-CM

## 2023-09-12 DIAGNOSIS — L247 Irritant contact dermatitis due to plants, except food: Secondary | ICD-10-CM | POA: Diagnosis not present

## 2023-09-12 DIAGNOSIS — Z9049 Acquired absence of other specified parts of digestive tract: Secondary | ICD-10-CM | POA: Diagnosis not present

## 2023-09-12 MED ORDER — PREDNISONE 20 MG PO TABS: ORAL_TABLET | ORAL | 0 refills | Status: DC

## 2023-09-12 NOTE — Telephone Encounter (Signed)

## 2023-09-12 NOTE — Progress Notes (Signed)
 Chief Complaint: chronic loose stools Primary GI MD: Dr. Barron Alvine  HPI: 53 year old female history of Lynch syndrome presents for evaluation of chronic loose stools  Last seen by Dr. Barron Alvine 08/2021.  Please see his note for details.  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  Discussed the use of AI scribe software for clinical note transcription with the patient, who gave verbal consent to proceed.  She has experienced chronic diarrhea for the past six months, with stools consistently loose, described as 'milkshakes, mashed potatoes, wet cement, water.' She has not had a solid stool during this period. Bowel movements occur primarily after eating, indicating postprandial diarrhea. No greasy or oily stools, floating stools, abdominal pain, nausea, or vomiting related to her current symptoms. A stool study conducted in January was negative for infectious causes including c diff. She is not due for a colonoscopy until next year.  She underwent a cholecystectomy approximately 30 years ago and has since been accustomed to certain foods causing increased bowel movements. However, her symptoms have worsened recently, with almost everything she eats leading to immediate bathroom use.  She has a history of nausea due to brain tumors, but this has not increased recently.     PREVIOUS GI WORKUP   Colonoscopy 06/28/2022 - Two 2 to 3 mm polyps in the sigmoid colon, removed with a cold snare. Resected and retrieved.  - Diverticulosis in the sigmoid colon and in the descending colon.  - The distal rectum and  anal verge are normal on retroflexion view.  - The examined portion of the ileum was normal. -Repeat 2 years (06/2024)  EGD 06/28/2022 - Normal esophagus.  - Normal stomach. Biopsied.  - Normal examined duodenum. -Repeat 2 to 4 years  A. 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   Cancer (HCC)    menigiomas brain   Chest pain in adult 07/31/2017   Family history of brain tumor    Family history of brain tumor 06/27/2021   Gallstones    Headache    Hyperlipidemia with target LDL less than 100 08/18/2015   Hyperparathyroidism (HCC)    Internal hemorrhoids 08/18/2014   Lynch syndrome    PONV (postoperative nausea and vomiting)     Past Surgical History:  Procedure Laterality Date   ABDOMINAL HYSTERECTOMY N/A 03/08/2022   Procedure: HYSTERECTOMY ABDOMINAL;  Surgeon: Myna Hidalgo, DO;  Location: [facility];  Service: Gynecology;  Laterality: N/A;   ADJACENT TISSUE TRANSFER/TISSUE REARRANGEMENT Right 11/22/2022   Procedure: Fat grafting to right  temple from right buttocks after brain surgery;  Surgeon: Peggye Form, DO;  Location:  SURGERY CENTER;  Service: Plastics;  Laterality: Right;   APPLICATION OF CRANIAL NAVIGATION Left 02/18/2021   Procedure: APPLICATION OF CRANIAL NAVIGATION;  Surgeon: Jadene Pierini, MD;  Location: [facility];  Service: Neurosurgery;  Laterality: Left;   APPLICATION OF CRANIAL NAVIGATION N/A 05/18/2021   Procedure: APPLICATION OF CRANIAL NAVIGATION;  Surgeon: Jadene Pierini, MD;  Location: [facility];  Service: Neurosurgery;  Laterality: N/A;   BIOPSY  06/28/2022    Procedure: BIOPSY;  Surgeon: Shellia Cleverly, DO;  Location: [facility];  Service: Gastroenterology;;   BRAIN SURGERY     CHOLECYSTECTOMY  1993   COLONOSCOPY WITH PROPOFOL N/A 06/28/2022   Procedure: COLONOSCOPY WITH PROPOFOL;  Surgeon: Shellia Cleverly, DO;  Location: [facility];  Service: Gastroenterology;  Laterality: N/A;   CRANIOTOMY Left 02/18/2021   Procedure: Left orbitozygomatic craniotomy for tumor resection  with Brain Lab;  Surgeon: Jadene Pierini, MD;  Location: [facility] OR;  Service: Neurosurgery;  Laterality: Left;   CRANIOTOMY Right 05/18/2021   Procedure: Right Orbitozygomatic craniotomy for tumor with brainlab;  Surgeon: Jadene Pierini, MD;  Location: [facility] OR;  Service: Neurosurgery;  Laterality: Right;   ESOPHAGOGASTRODUODENOSCOPY (EGD) WITH PROPOFOL N/A 06/28/2022   Procedure: ESOPHAGOGASTRODUODENOSCOPY (EGD) WITH PROPOFOL;  Surgeon: Shellia Cleverly, DO;  [facility]: [facility];  Service: Gastroenterology;  Laterality: N/A;   PARATHYROIDECTOMY Right 04/23/2023   Procedure: RIGHT INFERIOR PARATHYROIDECTOMY;  Surgeon: Darnell Level, MD;  Location: [facility];  Service: General;  Laterality: Right;   POLYPECTOMY  06/28/2022   Procedure: POLYPECTOMY;  Surgeon: Shellia Cleverly, DO;  [facility]: [facility];  Service: Gastroenterology;;   SALPINGOOPHORECTOMY Bilateral 03/08/2022   Procedure: OPEN SALPINGO OOPHORECTOMY;  Surgeon: Myna Hidalgo, DO;  Location: [facility];  Service: Gynecology;  Laterality: Bilateral;   SHOULDER ARTHROSCOPY Right 11/06/2019   Procedure: ARTHROSCOPY RIGHT SHOULDER, DEBRIDEMENT X TWO STRUCTURES; OPEN ROTATOR CUFF REPAIR;  Surgeon: Vickki Hearing, MD;  Location: [facility];  Service: Orthopedics;  Laterality: Right;    Current Outpatient Medications  Medication Sig Dispense Refill   atorvastatin (LIPITOR) 20 MG tablet Take 1 tablet (20 mg total) by mouth daily. 90 tablet 3   ciclopirox (PENLAC) 8 % solution Apply topically at bedtime. Apply over  nail and surrounding skin. Apply daily over previous coat. After seven (7) days, may remove with alcohol and continue cycle. 6.6 mL 0   DULoxetine (CYMBALTA) 60 MG capsule Take 1 capsule (60 mg total) by mouth daily. 90 capsule 3   gabapentin (NEURONTIN) 600 MG tablet Take 1 tablet (600 mg total) by mouth 3 (three) times daily. 270 tablet 4   hydrocortisone 1 % ointment Apply 1 Application topically 2 (two) times daily as needed for itching.     predniSONE (DELTASONE) 20 MG tablet 2 po at same time daily for 5 days 10 tablet 0   Probiotic Product (PROBIOTIC DAILY PO) Take 1 tablet by mouth daily. Olly     Biotin 5000 MCG TABS Take 5,000 mcg by mouth daily. (Patient not taking: Reported on 09/12/2023)     No current facility-administered medications for this visit.    Allergies as of 09/12/2023 - Review Complete 09/12/2023  Allergen Reaction Noted   Gadolinium derivatives Other (See Comments) 12/19/2021   Saline Nausea And Vomiting 08/22/2021   Sodium chloride Nausea And Vomiting 08/22/2021    Family History  Problem Relation Age of Onset   Cancer Mother    Hyperlipidemia Mother    Lung cancer Mother    Hypertension Father    Heart disease Father    Other Sister        meningiomas/tumors x11   Other Daughter  Lynch syndrome   Brain cancer Maternal Aunt        d. 50s-60s   Lung cancer Maternal Aunt    Bone cancer Maternal Aunt        d. 50s-60s   Liver cancer Paternal Uncle    Lung cancer Paternal Grandmother        d.85   Rectal cancer Neg Hx    Esophageal cancer Neg Hx    Stomach cancer Neg Hx    Breast cancer Neg Hx    Colon polyps Neg Hx    Colon cancer Neg Hx     Social History   Socioeconomic History   Marital status: Divorced    Spouse name: Not on file   Number of children: 2   Years of education: Not on file   Highest education level: Associate degree: occupational, Scientist, product/process development, or vocational program  Occupational History   Not on file  Tobacco Use    Smoking status: Never   Smokeless tobacco: Never  Vaping Use   Vaping status: Never Used  Substance and Sexual Activity   Alcohol use: No   Drug use: Never   Sexual activity: Yes    Birth control/protection: Surgical    Comment: hysterectomy  Other Topics Concern   Not on file  Social History Narrative   Not on file   Social Drivers of Health   Financial Resource Strain: Low Risk  (09/09/2023)   [facility]   Overall Financial Resource Strain (CARDIA)    Difficulty of Paying Living Expenses: Not hard at all  Food Insecurity: No Food Insecurity (09/09/2023)   Received from [facility]   Hunger Vital Sign    Worried About Running Out of Food in the Last Year: Never true    Ran Out of Food in the Last Year: Never true  Transportation Needs: No Transportation Needs (09/09/2023)   Received from [facility] - Transportation    Lack of Transportation (Medical): No    Lack of Transportation (Non-Medical): No  Physical Activity: Insufficiently Active (09/09/2023)   Received from [facility]   Exercise Vital Sign    Days of Exercise per Week: 1 day    Minutes of Exercise per Session: 10 min  Stress: Stress Concern Present (09/09/2023)   Received from [facility] - Occupational Stress Questionnaire    Feeling of Stress : To some extent  Social Connections: Somewhat Isolated (09/09/2023)   Received from [facility]   Social Network    How would you rate your social network (family, work, friends)?: Restricted participation with some degree of social isolation  Intimate Partner Violence: Not At Risk (09/09/2023)   Received from [facility]   HITS    Over the last 12 months how often did your partner physically hurt you?: Never    Over the last 12 months how often did your partner insult you or talk down to you?: Never    Over the last 12 months how often did your partner threaten you with physical harm?:  Never    Over the last 12 months how often did your partner scream or curse at you?: Never    Review of Systems:    Constitutional: No weight loss, fever, chills, weakness or fatigue HEENT: Eyes: No change in vision               Ears, Nose, Throat:  No change in hearing or congestion Skin:  No rash or itching Cardiovascular: No chest pain, chest pressure or palpitations   Respiratory: No SOB or cough Gastrointestinal: See HPI and otherwise negative Genitourinary: No dysuria or change in urinary frequency Neurological: No headache, dizziness or syncope Musculoskeletal: No new muscle or joint pain Hematologic: No bleeding or bruising Psychiatric: No history of depression or anxiety    Physical Exam:  Vital signs: BP 118/86   Pulse (!) 122   Ht 5\' 5"  (1.651 m)   Wt 171 lb (77.6 kg)   BMI 28.46 kg/m   Constitutional: NAD, Well developed, Well nourished, alert and cooperative Head:  Normocephalic and atraumatic. Eyes:   PEERL, EOMI. No icterus. Conjunctiva pink. Respiratory: Respirations even and unlabored. Lungs clear to auscultation bilaterally.   No wheezes, crackles, or rhonchi.  Cardiovascular:  Regular rate and rhythm. No peripheral edema, cyanosis or pallor.  Gastrointestinal:  Soft, nondistended, nontender. No rebound or guarding. Normal bowel sounds. No appreciable masses or hepatomegaly. Rectal:  Not performed.  Msk:  Symmetrical without gross deformities. Without edema, no deformity or joint abnormality.  Neurologic:  Alert and  oriented x4;  grossly normal neurologically.  Skin:   Dry and intact without significant lesions or rashes. Psychiatric: Oriented to person, place and time. Demonstrates good judgement and reason without abnormal affect or behaviors.   RELEVANT LABS AND IMAGING: CBC    Component Value Date/Time   WBC 5.9 04/23/2023 1125   RBC 4.56 04/23/2023 1125   HGB 13.1 04/23/2023 1125   HGB 14.5 10/10/2022 1021   HCT 41.4 04/23/2023 1125   HCT 44.5  10/10/2022 1021   PLT 268 04/23/2023 1125   PLT 303 10/10/2022 1021   MCV 90.8 04/23/2023 1125   MCV 91 10/10/2022 1021   MCH 28.7 04/23/2023 1125   MCHC 31.6 04/23/2023 1125   RDW 13.4 04/23/2023 1125   RDW 13.4 10/10/2022 1021   LYMPHSABS 1.9 04/23/2023 1125   LYMPHSABS 1.4 10/10/2022 1021   MONOABS 0.4 04/23/2023 1125   EOSABS 0.2 04/23/2023 1125   EOSABS 0.1 10/10/2022 1021   BASOSABS 0.0 04/23/2023 1125   BASOSABS 0.0 10/10/2022 1021    CMP     Component Value Date/Time   NA 139 07/06/2023 1518   K 4.4 07/06/2023 1518   CL 106 07/06/2023 1518   CO2 22 07/06/2023 1518   GLUCOSE 101 (H) 07/06/2023 1518   GLUCOSE 94 04/23/2023 1125   BUN 14 07/06/2023 1518   CREATININE 0.84 07/06/2023 1518   CREATININE 0.68 08/30/2012 1155   CALCIUM 10.3 (H) 07/06/2023 1518   PROT 6.8 10/10/2022 1021   ALBUMIN 4.4 10/10/2022 1021   AST 19 10/10/2022 1021   ALT 32 10/10/2022 1021   ALKPHOS 184 (H) 10/10/2022 1021   BILITOT 0.4 10/10/2022 1021   GFRNONAA >60 04/23/2023 1125   GFRNONAA >89 08/30/2012 1155   GFRAA 102 08/02/2020 1115   GFRAA >89 08/30/2012 1155     Assessment/Plan:      Chronic diarrhea post-cholecystectomy Chronic varying loose stool x 6months with negative infectious workup. Previous normal colonoscopy and endoscopy. Has always been an issue for her post cholecystectomy but now becoming more frequent. - Start fiber supplement such as Benefiber or Citrucel. - Instruct to call in 2-3 weeks if symptoms persist. - If fiber supplement is ineffective, prescribe Colestid (colestipol) once to twice daily. - Discuss potential side effect of constipation with Colestid. - Schedule follow-up in 8 weeks.     Boone Master, PA-C [facility] 09/12/2023, 12:13 PM  Cc: Raliegh Ip, DO

## 2023-09-12 NOTE — Patient Instructions (Addendum)
-  CHRONIC DIARRHEA POST-CHOLECYSTECTOMY: Chronic diarrhea after gallbladder removal is common and can cause loose stools, especially after eating. We recommend starting a fiber supplement like Benefiber or Citrucel. If symptoms persist after 2-3 weeks, please call us. If the fiber supplement does not help, we may prescribe Colestid (colestipol) once or twice daily, but be aware it can cause constipation. We will follow up in 8 weeks to monitor your progress.  Please purchase the following medications over the counter and take as directed: Fiber Supplement as directed   _______________________________________________________  If your blood pressure at your visit was 140/90 or greater, please contact your primary care physician to follow up on this.  _______________________________________________________  If you are age 85 or older, your body mass index should be between 23-30. Your Body mass index is 28.46 kg/m. If this is out of the aforementioned range listed, please consider follow up with your Primary Care Provider.  If you are age 77 or younger, your body mass index should be between 19-25. Your Body mass index is 28.46 kg/m. If this is out of the aformentioned range listed, please consider follow up with your Primary Care Provider.   ________________________________________________________  The [facility] providers would like to encourage you to use [facility] to communicate with providers for non-urgent requests or questions.  Due to long hold times on the telephone, sending your provider a message by [facility] may be a faster and more efficient way to get a response.  Please allow 48 business hours for a response.  Please remember that this is for non-urgent requests.  _______________________________________________________

## 2023-09-12 NOTE — Telephone Encounter (Signed)
 Message from Laurie Kelley sent at 09/12/2023  9:34 AM EDT  Copied From CRM [id number]. Reason for Triage: Patient thinks she is having an allergic reaction from medication. Patient sent via mychart:  THE EYE PICTURE WAS ON THE 14th. I THOUGHT I HAD GOTTEN INTO POISON [place]AK AGAIN BECAUSE I HAD BEEN OUTSIDE PRIOR TRIMMING A SMALL BUSH. I SAW DR LI 15th, SHE DID PREDNISONE AND HYDRO CREAM. I HAD THE RASH ON MY BREAST AT THE SAME TIME BUT I THOUGHT IT WAS ALL RELATED. ITS NOT. MY EYE IS BETTER. I TOOK THE PHOTO YESTERDAY OF MY BREAST. ITS REDDER TODAY. SPOTS ARE OTHER PLACES TO. IS IT THE CYMBALTA?   She also attached photos  Callback #: [id number]    Call History  Contact Date/Time Type Contact Phone/Fax By  09/12/2023 09:27 AM EDT Phone (Incoming) [place], Lucendia L [phone] Nicole Cella, Chinita Pester

## 2023-09-12 NOTE — Progress Notes (Signed)
 Agree with the assessment and plan as outlined by Boone Master, PA-C.  Nimrat Woolworth, DO, [facility]

## 2023-09-12 NOTE — Telephone Encounter (Signed)
 Dr. Nadine Counts responded to pt via MyChart 09/12/2023 at 9:35 AM.   Pt had sent a MyChart message with pictures of rash and Dr. Nadine Counts responded back and is sending in an rx.   Pt has responded back thanking Dr. Nadine Counts and agreeable with plan.   No triage needed so chart closed.

## 2023-10-15 ENCOUNTER — Ambulatory Visit (INDEPENDENT_AMBULATORY_CARE_PROVIDER_SITE_OTHER): Admitting: Family Medicine

## 2023-10-15 ENCOUNTER — Encounter: Payer: Self-pay | Admitting: Family Medicine

## 2023-10-15 VITALS — BP 118/71 | HR 78 | Temp 98.1°F | Ht 65.0 in | Wt 168.0 lb

## 2023-10-15 DIAGNOSIS — F419 Anxiety disorder, unspecified: Secondary | ICD-10-CM

## 2023-10-15 DIAGNOSIS — F32A Depression, unspecified: Secondary | ICD-10-CM

## 2023-10-15 NOTE — Progress Notes (Signed)
 Subjective: CC: Follow-up Cymbalta  PCP: Eliodoro Guerin, DO AOZ:HYQMV L Barricklow is a 53 y.o. female presenting to clinic today for:  1.  Anxiety with depression She reports that this is really helped her mood.  She is pleased with the Cymbalta  60 mg which was initiated last visit.  She is not it is a huge difference in her pain response.  She still needs it consistently take that gabapentin  3 times daily.  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  She is well supported by her family members, who live very close by and is optimistic about getting this taken care of.  She notes that as of late she is really not been able to open her jaw very widely and subsequently has lost weight because of this tumor causing so much pressure on that joint.   ROS: Per HPI  Allergies  Allergen Reactions   Gadolinium Derivatives Other (See Comments)    Pt reports she developed chest tightness, burning of her back, and weakness after MRI contrast 11/2021. Per Dr. Vanda Gemma, he suggests 13 hr protocol prior to MRI.    Saline Nausea And Vomiting   Sodium Chloride  Nausea And Vomiting   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   Cancer (HCC)    menigiomas brain   Chest pain in adult 07/31/2017   Family history of brain tumor    Family history of brain tumor 06/27/2021   Gallstones    Headache    Hyperlipidemia with target LDL less than 100 08/18/2015   Hyperparathyroidism (HCC)    Internal hemorrhoids 08/18/2014   Lynch syndrome    PONV (postoperative nausea and vomiting)     Current Outpatient Medications:    atorvastatin  (LIPITOR) 20 MG tablet, Take 1 tablet (20 mg total) by mouth daily.,  Disp: 90 tablet, Rfl: 3   DULoxetine  (CYMBALTA ) 60 MG capsule, Take 1 capsule (60 mg total) by mouth daily., Disp: 90 capsule, Rfl: 3   gabapentin  (NEURONTIN ) 600 MG tablet, Take 1 tablet (600 mg total) by mouth 3 (three) times daily., Disp: 270 tablet, Rfl: 4   hydrocortisone  1 % ointment, Apply 1 Application topically 2 (two) times daily as needed for itching., Disp: , Rfl:    Probiotic Product (PROBIOTIC DAILY PO), Take 1 tablet by mouth daily. Olly, Disp: , Rfl:  Social History   Socioeconomic History   Marital status: Divorced    Spouse name: Not on file   Number of children: 2   Years of education: Not on file   Highest education level: Associate degree: occupational, Scientist, product/process development, or vocational program  Occupational History   Not on file  Tobacco Use   Smoking status: Never   Smokeless tobacco: Never  Vaping Use   Vaping status: Never Used  Substance and Sexual Activity   Alcohol use: No   Drug use: Never   Sexual activity: Yes    Birth control/protection: Surgical    Comment: hysterectomy  Other Topics Concern   Not on file  Social History Narrative   Not on file   Social Drivers of Health   Financial Resource Strain: Low  Risk  (09/09/2023)   Received from [facility]   Overall Financial Resource Strain (CARDIA)    Difficulty of Paying Living Expenses: Not hard at all  Food Insecurity: No Food Insecurity (09/09/2023)   Received from [facility]   Hunger Vital Sign    Worried About Running Out of Food in the Last Year: Never true    Ran Out of Food in the Last Year: Never true  Transportation Needs: No Transportation Needs (09/09/2023)   Received from [facility] - Transportation    Lack of Transportation (Medical): No    Lack of Transportation (Non-Medical): No  Physical Activity: Insufficiently Active (09/09/2023)   Received from [facility]   Exercise Vital Sign    Days of Exercise per Week: 1 day    Minutes of Exercise per Session: 10 min   Stress: Stress Concern Present (09/09/2023)   Received from [facility] - Occupational Stress Questionnaire    Feeling of Stress : To some extent  Social Connections: Somewhat Isolated (09/09/2023)   Received from [facility]   Social Network    How would you rate your social network (family, work, friends)?: Restricted participation with some degree of social isolation  Intimate Partner Violence: Not At Risk (09/09/2023)   Received from [facility]   HITS    Over the last 12 months how often did your partner physically hurt you?: Never    Over the last 12 months how often did your partner insult you or talk down to you?: Never    Over the last 12 months how often did your partner threaten you with physical harm?: Never    Over the last 12 months how often did your partner scream or curse at you?: Never   Family History  Problem Relation Age of Onset   Cancer Mother    Hyperlipidemia Mother    Lung cancer Mother    Hypertension Father    Heart disease Father    Other Sister        meningiomas/tumors x11   Other Daughter        Lynch syndrome   Brain cancer Maternal Aunt        d. 29s-60s   Lung cancer Maternal Aunt    Bone cancer Maternal Aunt        d. 50s-60s   Liver cancer Paternal Uncle    Lung cancer Paternal Grandmother        d.85   Rectal cancer Neg Hx    Esophageal cancer Neg Hx    Stomach cancer Neg Hx    Breast cancer Neg Hx    Colon polyps Neg Hx    Colon cancer Neg Hx     Objective: Office vital signs reviewed. BP 118/71   Pulse 78   Temp 98.1 F (36.7 C)   Ht 5\' 5"  (1.651 m)   Wt 168 lb (76.2 kg)   SpO2 96%   BMI 27.96 kg/m   Physical Examination:  General: Awake, alert, well nourished, No acute distress Cardio: Regular rate and rhythm.  S1, S2 heard.  No murmurs Pulm: Clear to auscultation bilaterally.  Normal work of breathing on room air.  No wheezes, rhonchi or rails Psych: Mood stable, speech  normal, affect appropriate.  Very pleasant, interactive     08/27/2023   10:59 AM 07/06/2023    2:18 PM 03/29/2023   10:10 AM  Depression screen PHQ 2/9  Decreased Interest  1 2 0  Down, Depressed, Hopeless  0   PHQ - 2 Score 1 2 0  Altered sleeping  0 0  Tired, decreased energy  2 2  Change in appetite  2 0  Feeling bad or failure about yourself   1 0  Trouble concentrating  1 1  Moving slowly or fidgety/restless  1 0  Suicidal thoughts  0 0  PHQ-9 Score  9 3  Difficult doing work/chores  Somewhat difficult Extremely dIfficult      08/27/2023   10:59 AM 07/06/2023    2:21 PM 07/06/2023    2:12 PM 03/29/2023   10:10 AM  GAD 7 : Generalized Anxiety Score  Nervous, Anxious, on Edge 1 1 0 1  Control/stop worrying 1 1 0 1  Worry too much - different things 1 1 0 1  Trouble relaxing 1 1 0 0  Restless 1 1 0 0  Easily annoyed or irritable 0 0 0 2  Afraid - awful might happen 1 2 0 3  Total GAD 7 Score 6 7 0 8  Anxiety Difficulty Very difficult Very difficult  Extremely difficult      Assessment/ Plan: 53 y.o. female   Anxiety and depression  Doing very well with Cymbalta .  No concerning side effects.  She may continue.  Has refills through 1 year.  Continue gabapentin  as needed as well.  Recommend 28-month follow-up just to see how she is doing after this very large surgery.  She is of course welcome to reach out to me sooner than that time if needed   Eliodoro Guerin, DO [facility] [phone]

## 2023-10-18 ENCOUNTER — Telehealth: Payer: Self-pay | Admitting: Gastroenterology

## 2023-10-18 MED ORDER — COLESTIPOL HCL 1 G PO TABS: 1.0000 g | ORAL_TABLET | Freq: Every day | ORAL | 3 refills | Status: DC

## 2023-10-18 NOTE — Telephone Encounter (Signed)
 Confirmed verbally with Suzanna Erp, PA-C that script should read "colestipol  2g once daily x 30 days with 3 refills".   Left message for patient to call back.

## 2023-10-18 NOTE — Telephone Encounter (Signed)
 [place]-  Per your most recent office note, we would try colestid  if patient symptoms did not improve on fiber supplementation. Please advise.Aaron AasAaron Aas

## 2023-10-18 NOTE — Telephone Encounter (Signed)
 Inbound call from patient requesting to speak with a nurse in regards to her symptoms not resolving.   Patient states she did benifiber as well as altering her diet.   Please advise. Thank you

## 2023-10-18 NOTE — Telephone Encounter (Signed)
 Patient states that she continues to have multiple episodes of diarrhea and loose stools daily despite the addition of a fiber supplement and changes in her diet. Patient advised we will try her on colestid  daily and see if this is helpful for her. Cautioned that this medication does have the potential to actually cause constipation. Patient is asked to let us  know how she is doing on the colestid  after a couple weeks, sooner if needed. She verbalizes understanding.

## 2023-10-19 ENCOUNTER — Other Ambulatory Visit: Payer: Self-pay | Admitting: Gastroenterology

## 2023-10-24 ENCOUNTER — Encounter: Admitting: Internal Medicine

## 2023-11-04 ENCOUNTER — Emergency Department (HOSPITAL_COMMUNITY)
Admission: EM | Admit: 2023-11-04 | Discharge: 2023-11-04 | Disposition: A | Discharge status: Home / Self Care | Attending: Emergency Medicine | Admitting: Emergency Medicine

## 2023-11-04 ENCOUNTER — Emergency Department (HOSPITAL_COMMUNITY)

## 2023-11-04 ENCOUNTER — Other Ambulatory Visit: Payer: Self-pay

## 2023-11-04 ENCOUNTER — Encounter (HOSPITAL_COMMUNITY): Payer: Self-pay

## 2023-11-04 DIAGNOSIS — E871 Hypo-osmolality and hyponatremia: Secondary | ICD-10-CM | POA: Insufficient documentation

## 2023-11-04 DIAGNOSIS — R109 Unspecified abdominal pain: Secondary | ICD-10-CM | POA: Diagnosis present

## 2023-11-04 DIAGNOSIS — N12 Tubulo-interstitial nephritis, not specified as acute or chronic: Secondary | ICD-10-CM | POA: Insufficient documentation

## 2023-11-04 LAB — URINALYSIS, ROUTINE W REFLEX MICROSCOPIC
Bilirubin Urine: NEGATIVE
Glucose, UA: NEGATIVE mg/dL
Ketones, ur: NEGATIVE mg/dL
Nitrite: POSITIVE — AB
Protein, ur: NEGATIVE mg/dL
Specific Gravity, Urine: 1.014 (ref 1.005–1.030)
pH: 6 (ref 5.0–8.0)

## 2023-11-04 LAB — CBC WITH DIFFERENTIAL/PLATELET
Abs Immature Granulocytes: 0.03 10*3/uL (ref 0.00–0.07)
Basophils Absolute: 0.1 10*3/uL (ref 0.0–0.1)
Basophils Relative: 1 %
Eosinophils Absolute: 0.1 10*3/uL (ref 0.0–0.5)
Eosinophils Relative: 1 %
HCT: 43.4 % (ref 36.0–46.0)
Hemoglobin: 13.8 g/dL (ref 12.0–15.0)
Immature Granulocytes: 0 %
Lymphocytes Relative: 14 %
Lymphs Abs: 1.3 10*3/uL (ref 0.7–4.0)
MCH: 28.9 pg (ref 26.0–34.0)
MCHC: 31.8 g/dL (ref 30.0–36.0)
MCV: 91 fL (ref 80.0–100.0)
Monocytes Absolute: 0.6 10*3/uL (ref 0.1–1.0)
Monocytes Relative: 6 %
Neutro Abs: 7.5 10*3/uL (ref 1.7–7.7)
Neutrophils Relative %: 78 %
Platelets: 299 10*3/uL (ref 150–400)
RBC: 4.77 MIL/uL (ref 3.87–5.11)
RDW: 13.7 % (ref 11.5–15.5)
WBC: 9.6 10*3/uL (ref 4.0–10.5)
nRBC: 0 % (ref 0.0–0.2)

## 2023-11-04 LAB — COMPREHENSIVE METABOLIC PANEL WITH GFR
ALT: 16 U/L (ref 0–44)
AST: 16 U/L (ref 15–41)
Albumin: 4 g/dL (ref 3.5–5.0)
Alkaline Phosphatase: 143 U/L — ABNORMAL HIGH (ref 38–126)
Anion gap: 5 (ref 5–15)
BUN: 12 mg/dL (ref 6–20)
CO2: 23 mmol/L (ref 22–32)
Calcium: 9.3 mg/dL (ref 8.9–10.3)
Chloride: 105 mmol/L (ref 98–111)
Creatinine, Ser: 0.67 mg/dL (ref 0.44–1.00)
GFR, Estimated: >60 mL/min (ref >60)
Glucose, Bld: 105 mg/dL — ABNORMAL HIGH (ref 70–99)
Potassium: 4 mmol/L (ref 3.5–5.1)
Sodium: 133 mmol/L — ABNORMAL LOW (ref 135–145)
Total Bilirubin: 0.5 mg/dL (ref 0.0–1.2)
Total Protein: 7.2 g/dL (ref 6.5–8.1)

## 2023-11-04 LAB — LIPASE, BLOOD: Lipase: 57 U/L — ABNORMAL HIGH (ref 11–51)

## 2023-11-04 MED ORDER — SODIUM CHLORIDE 0.9 % IV SOLN
1.0000 g | Freq: Once | INTRAVENOUS | Status: AC
  Administered 2023-11-04: 1 g via INTRAVENOUS
  Filled 2023-11-04: qty 10

## 2023-11-04 MED ORDER — ONDANSETRON 4 MG PO TBDP
4.0000 mg | ORAL_TABLET | Freq: Once | ORAL | Status: AC
  Administered 2023-11-04: 4 mg via ORAL
  Filled 2023-11-04: qty 1

## 2023-11-04 MED ORDER — ONDANSETRON 4 MG PO TBDP: 4.0000 mg | ORAL_TABLET | Freq: Four times a day (QID) | ORAL | 0 refills | Status: DC | PRN

## 2023-11-04 MED ORDER — KETOROLAC TROMETHAMINE 15 MG/ML IJ SOLN
15.0000 mg | Freq: Once | INTRAMUSCULAR | Status: AC
  Administered 2023-11-04: 15 mg via INTRAVENOUS
  Filled 2023-11-04: qty 1

## 2023-11-04 MED ORDER — NAPROXEN 500 MG PO TABS: 500.0000 mg | ORAL_TABLET | Freq: Two times a day (BID) | ORAL | 0 refills | Status: DC

## 2023-11-04 MED ORDER — CEFPODOXIME PROXETIL 200 MG PO TABS: 200.0000 mg | ORAL_TABLET | Freq: Two times a day (BID) | ORAL | 0 refills | Status: AC

## 2023-11-04 MED ORDER — FENTANYL CITRATE PF 50 MCG/ML IJ SOSY
50.0000 ug | PREFILLED_SYRINGE | INTRAMUSCULAR | Status: DC | PRN
  Administered 2023-11-04: 50 ug via NASAL
  Filled 2023-11-04: qty 1

## 2023-11-04 NOTE — Discharge Instructions (Addendum)
 It was a pleasure taking care of you today.  You were evaluated for pain in your right flank.  Your workup was overall reassuring but you do have a urinary tract infection, given the location of your pain I am concerned you have a kidney infection and we are going to treat you with antibiotics.  Follow-up closely with your PCP so they can make sure you are getting better.  If you have increased pain, develop a fever or persistent vomiting or any other worrisome changes please come back to the ER right away.  In addition to the naproxen  you are ordered, you can take over-the-counter Tylenol  as directed on the packaging for pain.

## 2023-11-04 NOTE — ED Triage Notes (Signed)
 Pt presents with R sided flank pain that started at 02:30 this AM and woke her from her sleep. Pain is described as dull and she has has tenderness to anything touching the skin. No rash is present. Pain has gradual worsening. Denies urinary symptoms. She did have some nausea while in the waiting room. She has not taken anything specifically for the symptoms, but did take 600 mg of gabapentin  that she takes for brain tumors and it did not help with the flank pain.

## 2023-11-04 NOTE — ED Provider Notes (Signed)
 [place] [facility] AT [facility] Provider Note   CSN: [id number] Arrival date & time: 11/04/23  1217     History  Chief Complaint  Patient presents with   Flank Pain    Laurie Kelley is a 53 y.o. female.  History of meningioma, high cholesterol, cholecystectomy, hysterectomy.  Presents the ER today complaining of right flank pain, states she had some right low back pain yesterday now this morning left flank.  Denies urinary symptoms, denies fever, weakness A.  No abdominal pain, no chest pain or shortness of breath.   Flank Pain       Home Medications Prior to Admission medications   Medication Sig Start Date End Date Taking? Authorizing Provider  cefpodoxime  (VANTIN ) 200 MG tablet Take 1 tablet (200 mg total) by mouth 2 (two) times daily for 10 days. 11/04/23 11/14/23 Yes Theodore Rahrig A, PA-C  naproxen  (NAPROSYN ) 500 MG tablet Take 1 tablet (500 mg total) by mouth 2 (two) times daily. 11/04/23  Yes Shawnta Schlegel A, PA-C  atorvastatin  (LIPITOR) 20 MG tablet Take 1 tablet (20 mg total) by mouth daily. 02/27/23   Eliodoro Guerin, DO  colestipol  (COLESTID ) 1 g tablet Take 1 tablet (1 g total) by mouth daily. 10/18/23   McMichael, Elba Greathouse, PA-C  DULoxetine  (CYMBALTA ) 60 MG capsule Take 1 capsule (60 mg total) by mouth daily. 08/27/23   Eliodoro Guerin, DO  gabapentin  (NEURONTIN ) 600 MG tablet Take 1 tablet (600 mg total) by mouth 3 (three) times daily. 08/27/23   Eliodoro Guerin, DO  hydrocortisone  1 % ointment Apply 1 Application topically 2 (two) times daily as needed for itching.    [provider]  ondansetron  (ZOFRAN -ODT) 4 MG disintegrating tablet Take 1 tablet (4 mg total) by mouth every 6 (six) hours as needed for nausea or vomiting. 11/04/23  Yes Reet Scharrer A, PA-C  Probiotic Product (PROBIOTIC DAILY PO) Take 1 tablet by mouth daily. [place]    [provider]      Allergies    Gadolinium derivatives, Saline, and  Sodium chloride     Review of Systems   Review of Systems  Genitourinary:  Positive for flank pain.    Physical Exam Updated Vital Signs BP 116/67   Pulse 63   Temp 97.9 F (36.6 C) (Oral)   Resp 20   Ht 5\' 5"  (1.651 m)   Wt 76.2 kg   SpO2 100%   BMI 27.96 kg/m  Physical Exam Vitals and nursing note reviewed.  Constitutional:      General: She is not in acute distress.    Appearance: She is well-developed.  HENT:     Head: Normocephalic and atraumatic.     Mouth/Throat:     Mouth: Mucous membranes are moist.  Eyes:     Conjunctiva/sclera: Conjunctivae normal.  Cardiovascular:     Rate and Rhythm: Normal rate and regular rhythm.     Heart sounds: No murmur heard. Pulmonary:     Effort: Pulmonary effort is normal. No respiratory distress.     Breath sounds: Normal breath sounds.  Abdominal:     Palpations: Abdomen is soft.     Tenderness: There is no abdominal tenderness. There is right CVA tenderness. There is no guarding or rebound.  Musculoskeletal:        General: No swelling.     Cervical back: Neck supple.     Right lower leg: No edema.     Left lower leg: No  edema.  Skin:    General: Skin is warm and dry.     Capillary Refill: Capillary refill takes less than 2 seconds.  Neurological:     General: No focal deficit present.     Mental Status: She is alert and oriented to person, place, and time.  Psychiatric:        Mood and Affect: Mood normal.     ED Results / Procedures / Treatments   Labs (all labs ordered are listed, but only abnormal results are displayed) Labs Reviewed  URINALYSIS, ROUTINE W REFLEX MICROSCOPIC - Abnormal; Notable for the following components:      Result Value   APPearance HAZY (*)    Hgb urine dipstick MODERATE (*)    Nitrite POSITIVE (*)    Leukocytes,Ua LARGE (*)    Bacteria, UA MANY (*)    All other components within normal limits  COMPREHENSIVE METABOLIC PANEL WITH GFR - Abnormal; Notable for the following components:    Sodium 133 (*)    Glucose, Bld 105 (*)    Alkaline Phosphatase 143 (*)    All other components within normal limits  LIPASE, BLOOD - Abnormal; Notable for the following components:   Lipase 57 (*)    All other components within normal limits  URINE CULTURE  CBC WITH DIFFERENTIAL/PLATELET    EKG None  Radiology CT Renal Stone Study Result Date: 11/04/2023 CLINICAL DATA:  Acute right flank pain. EXAM: CT ABDOMEN AND PELVIS WITHOUT CONTRAST TECHNIQUE: Multidetector CT imaging of the abdomen and pelvis was performed following the standard protocol without IV contrast. RADIATION DOSE REDUCTION: This exam was performed according to the departmental dose-optimization program which includes automated exposure control, adjustment of the mA and/or kV according to patient size and/or use of iterative reconstruction technique. COMPARISON:  January 19, 2012. FINDINGS: Lower chest: No acute abnormality. Hepatobiliary: No focal liver abnormality is seen. Status post cholecystectomy. No biliary dilatation. Pancreas: Unremarkable. No pancreatic ductal dilatation or surrounding inflammatory changes. Spleen: Normal in size without focal abnormality. Adrenals/Urinary Tract: Adrenal glands appear normal. Bilateral nephrolithiasis is noted with the largest calculus measuring 11 mm in lower pole of right kidney. No definite hydronephrosis or renal obstruction is noted. Urinary bladder is unremarkable. Stomach/Bowel: Stomach is within normal limits. Appendix appears normal. No evidence of bowel wall thickening, distention, or inflammatory changes. Vascular/Lymphatic: No significant vascular findings are present. No enlarged abdominal or pelvic lymph nodes. Reproductive: Status post hysterectomy. No adnexal masses. Other: No ascites or hernia is noted. Musculoskeletal: No acute or significant osseous findings. IMPRESSION: Bilateral nonobstructive nephrolithiasis. Electronically Signed   By: Rosalene Colon M.D.   On:  11/04/2023 16:32    Procedures Procedures    Medications Ordered in ED Medications  fentaNYL  (SUBLIMAZE ) injection 50 mcg (50 mcg Nasal Given 11/04/23 1318)  ondansetron  (ZOFRAN -ODT) disintegrating tablet 4 mg (4 mg Oral Given 11/04/23 1318)  ketorolac  (TORADOL ) 15 MG/ML injection 15 mg (15 mg Intravenous Given 11/04/23 1512)  cefTRIAXone  (ROCEPHIN ) 1 g in sodium chloride  0.9 % 100 mL IVPB (0 g Intravenous Stopped 11/04/23 1754)    ED Course/ Medical Decision Making/ A&P                                 Medical Decision Making This patient presents to the ED for concern of right flank pain started yesterday., this involves an extensive number of treatment options, and is a complaint that carries with  it a high risk of complications and morbidity.  The differential diagnosis includes ureterolithiasis, pyelonephritis, UTI, muscle strain, herpes zoster, other   Co morbidities that complicate the patient evaluation  Meningioma, history of kidney stone   Additional history obtained:  Additional history obtained from EMR External records from outside source obtained and reviewed including prior notes and labs   Lab Tests:  I Ordered, and personally interpreted labs.  The pertinent results include:   CBC-no leukocytosis or anemia CMP-mild hyponatremia, normal potassium, chloride, glucose, alk phos slightly elevated but similar to baseline Lipase-just over the limit of normal UA shows hazy urine with positive nitrate, large leukocytes, 6-10 red blood cells per high-power field and 21-50 whites blood cells per high-power field and many bacteria   Imaging Studies ordered:  I ordered imaging studies including CT abdomen pelvis I independently visualized and interpreted imaging which showed bilateral nonobstructing kidney stones, no hydronephrosis I agree with the radiologist interpretation     Problem List / ED Course / Critical interventions / Medication management  Right flank  pain-patient having right flank pain since yesterday, she is uncomfortable in appearance, she gotten fentanyl  while waiting without relief but got good relief with IV Toradol .  She has right CVA tenderness but no hyperalgesia or overlying rash to suggest this is pain related to herpes zoster.  Labs overall overall reassuring, UA is significant for UTI. CT shows bilateral nonobstructing stones but no acute abnormalities.  Patient is overall well-appearing but does have right CVA tenderness so will treat for pyelonephritis.  She was advised on follow-up and strict return precautions.  Urine sent for culture.  Patient advised to follow-up with PCP in a couple of days for culture results and reevaluation.  I have reviewed the patients home medicines and have made adjustments as needed     Amount and/or Complexity of Data Reviewed Labs: ordered. Radiology: ordered.  Risk Prescription drug management.           Final Clinical Impression(s) / ED Diagnoses Final diagnoses:  Pyelonephritis    Rx / DC Orders ED Discharge Orders          Ordered    cefpodoxime  (VANTIN ) 200 MG tablet  2 times daily        11/04/23 1703    ondansetron  (ZOFRAN -ODT) 4 MG disintegrating tablet  Every 6 hours PRN        11/04/23 1703    naproxen  (NAPROSYN ) 500 MG tablet  2 times daily        11/04/23 [address] 11/04/23 1903    Ninetta Basket, MD 11/06/23 [phone]

## 2023-11-07 LAB — URINE CULTURE: Culture: 80000 — AB

## 2023-11-08 ENCOUNTER — Telehealth (HOSPITAL_BASED_OUTPATIENT_CLINIC_OR_DEPARTMENT_OTHER): Payer: Self-pay | Admitting: *Deleted

## 2023-11-08 NOTE — Telephone Encounter (Signed)
 Post ED Visit - Positive Culture Follow-up: Successful Patient Follow-Up  Culture assessed and recommendations reviewed by:  [x]  Adaline Ada, Pharm.D. []  Skeet Duke, Pharm.D., BCPS AQ-ID []  Leslee Rase, Pharm.D., BCPS []  Garland Junk, [address].D., BCPS []  [place], [place].D., BCPS, AAHIVP []  Alcide Aly, Pharm.D., BCPS, AAHIVP []  Jerri Morale, PharmD, BCPS []  Graham Laws, PharmD, BCPS []  Cleda Curly, PharmD, BCPS []  Tamar Fairly, PharmD  Positive urine culture  []  Patient discharged without antimicrobial prescription and treatment is now indicated [x]  Organism is resistant to prescribed ED discharge antimicrobial []  Patient with positive blood cultures  Changes discussed with ED provider: call for symptom check & stop cefpodoxime  if any symptoms.  Pt reported continued flank pain. New antibiotic prescription: Macrobid  100 mg BID for 5 days Called to [facility] in [place], [place]  Contacted patient, date 11/08/23, time 0855   Laurie Kelley 11/08/2023, 9:01 AM

## 2023-11-14 ENCOUNTER — Other Ambulatory Visit: Payer: Self-pay | Admitting: Family Medicine

## 2023-11-14 DIAGNOSIS — Z1231 Encounter for screening mammogram for malignant neoplasm of breast: Secondary | ICD-10-CM

## 2023-11-22 ENCOUNTER — Other Ambulatory Visit (INDEPENDENT_AMBULATORY_CARE_PROVIDER_SITE_OTHER)

## 2023-11-22 ENCOUNTER — Ambulatory Visit (HOSPITAL_COMMUNITY)
Admission: RE | Admit: 2023-11-22 | Discharge: 2023-11-22 | Disposition: A | Discharge status: Home / Self Care | Source: Ambulatory Visit | Attending: Gastroenterology | Admitting: Gastroenterology

## 2023-11-22 ENCOUNTER — Ambulatory Visit (INDEPENDENT_AMBULATORY_CARE_PROVIDER_SITE_OTHER): Admitting: Gastroenterology

## 2023-11-22 ENCOUNTER — Encounter: Payer: Self-pay | Admitting: Gastroenterology

## 2023-11-22 ENCOUNTER — Ambulatory Visit: Payer: Self-pay | Admitting: Gastroenterology

## 2023-11-22 VITALS — BP 118/76 | HR 61 | Ht 65.0 in | Wt 167.4 lb

## 2023-11-22 DIAGNOSIS — N200 Calculus of kidney: Secondary | ICD-10-CM | POA: Diagnosis present

## 2023-11-22 DIAGNOSIS — R10816 Epigastric abdominal tenderness: Secondary | ICD-10-CM

## 2023-11-22 DIAGNOSIS — K915 Postcholecystectomy syndrome: Secondary | ICD-10-CM | POA: Diagnosis not present

## 2023-11-22 DIAGNOSIS — R109 Unspecified abdominal pain: Secondary | ICD-10-CM | POA: Insufficient documentation

## 2023-11-22 DIAGNOSIS — K529 Noninfective gastroenteritis and colitis, unspecified: Secondary | ICD-10-CM

## 2023-11-22 LAB — CBC WITH DIFFERENTIAL/PLATELET
Basophils Absolute: 0.1 10*3/uL (ref 0.0–0.1)
Basophils Relative: 1.1 % (ref 0.0–3.0)
Eosinophils Absolute: 0.1 10*3/uL (ref 0.0–0.7)
Eosinophils Relative: 2.2 % (ref 0.0–5.0)
HCT: 39 % (ref 36.0–46.0)
Hemoglobin: 13 g/dL (ref 12.0–15.0)
Lymphocytes Relative: 27.3 % (ref 12.0–46.0)
Lymphs Abs: 1.8 10*3/uL (ref 0.7–4.0)
MCHC: 33.3 g/dL (ref 30.0–36.0)
MCV: 86.3 fl (ref 78.0–100.0)
Monocytes Absolute: 0.4 10*3/uL (ref 0.1–1.0)
Monocytes Relative: 6.1 % (ref 3.0–12.0)
Neutro Abs: 4.2 10*3/uL (ref 1.4–7.7)
Neutrophils Relative %: 63.3 % (ref 43.0–77.0)
Platelets: 350 10*3/uL (ref 150.0–400.0)
RBC: 4.52 Mil/uL (ref 3.87–5.11)
RDW: 14 % (ref 11.5–15.5)
WBC: 6.7 10*3/uL (ref 4.0–10.5)

## 2023-11-22 LAB — COMPREHENSIVE METABOLIC PANEL WITH GFR
ALT: 19 U/L (ref 0–35)
AST: 19 U/L (ref 0–37)
Albumin: 4.2 g/dL (ref 3.5–5.2)
Alkaline Phosphatase: 120 U/L — ABNORMAL HIGH (ref 39–117)
BUN: 13 mg/dL (ref 6–23)
CO2: 25 meq/L (ref 19–32)
Calcium: 10 mg/dL (ref 8.4–10.5)
Chloride: 107 meq/L (ref 96–112)
Creatinine, Ser: 0.85 mg/dL (ref 0.40–1.20)
GFR: 79.6 mL/min (ref >60.00)
Glucose, Bld: 92 mg/dL (ref 70–99)
Potassium: 4.3 meq/L (ref 3.5–5.1)
Sodium: 141 meq/L (ref 135–145)
Total Bilirubin: 0.5 mg/dL (ref 0.2–1.2)
Total Protein: 6.9 g/dL (ref 6.0–8.3)

## 2023-11-22 NOTE — Progress Notes (Signed)
 Chief Complaint: Follow up Primary GI MD: Dr. 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  Discussed the use of AI scribe software for clinical note transcription with the patient, who gave verbal consent to proceed.  History of Present Illness  She has experienced persistent diarrhea since her gallbladder removal. Initial management with fiber supplements was ineffective. She was subsequently prescribed colestipol , which has effectively controlled her diarrhea. However, she dislikes taking pills and is interested in potentially discontinuing the medication. She currently takes one colestipol  pill daily, which she finds challenging to schedule around her other medications, particularly gabapentin , which she takes three times a day. She experiences constipation as a side effect of colestipol .  On May 18, she experienced severe right-sided pain, leading to an emergency room visit where she was diagnosed with kidney stones in both kidneys and a urinary tract infection in one kidney. The pain was severe enough that fentanyl  did not alleviate it. Recently, she has noticed a shift in pain to her left side, describing it as 'the size of a plum,' tight, and tender, making it difficult to lie on that side. She has not yet seen a urologist for her kidney stones.  She is currently taking gabapentin  three times a day.   PREVIOUS GI WORKUP   Colonoscopy 06/28/2022 - Two 2 to 3 mm polyps in the sigmoid colon,  removed with a cold snare. Resected and retrieved.  - Diverticulosis in the sigmoid colon and in the descending colon.  - The distal rectum and anal verge are normal on retroflexion view.  - The examined portion of the ileum was normal. -Repeat 2 years (06/2024)   EGD 06/28/2022 - Normal esophagus.  - Normal stomach. Biopsied.  - Normal examined duodenum. -Repeat 2 to 4 years   A. 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   Cancer (HCC)    menigiomas brain   Chest pain in adult 07/31/2017   Family history of brain tumor    Family history of brain tumor 06/27/2021   Gallstones    Headache    Hyperlipidemia with target LDL less than 100 08/18/2015   Hyperparathyroidism (HCC)    Internal hemorrhoids 08/18/2014   Lynch syndrome    PONV (postoperative nausea and vomiting)     Past Surgical History:  Procedure Laterality Date   ABDOMINAL HYSTERECTOMY N/A 03/08/2022   Procedure: HYSTERECTOMY ABDOMINAL;  Surgeon: Keene Pastures, DO;  Location: [facility];  Service: Gynecology;  Laterality: N/A;   ADJACENT TISSUE TRANSFER/TISSUE REARRANGEMENT Right 11/22/2022   Procedure: Fat grafting to right  temple from right buttocks after brain surgery;  Surgeon: Thornell Flirt, DO;  Location: [facility];  Service: Government social research officer;  Laterality: Right;   APPLICATION OF CRANIAL NAVIGATION Left 02/18/2021   Procedure: APPLICATION OF CRANIAL NAVIGATION;  Surgeon: Cannon Champion, MD;  Location: [facility];  Service: Neurosurgery;  Laterality: Left;   APPLICATION OF CRANIAL NAVIGATION N/A 05/18/2021   Procedure: APPLICATION  OF CRANIAL NAVIGATION;  Surgeon: Cannon Champion, MD;  Location: [facility];  Service: Neurosurgery;  Laterality: N/A;   BIOPSY  06/28/2022   Procedure: BIOPSY;  Surgeon: Annis Kinder, DO;  Location: [facility];  Service: Gastroenterology;;   BRAIN SURGERY     CHOLECYSTECTOMY  1993   COLONOSCOPY WITH PROPOFOL  N/A 06/28/2022   Procedure: COLONOSCOPY WITH PROPOFOL ;  Surgeon: Annis Kinder, DO;  Location: [facility];  Service: Gastroenterology;  Laterality: N/A;   CRANIOTOMY Left 02/18/2021   Procedure: Left orbitozygomatic craniotomy for tumor resection  with Brain Lab;  Surgeon: Cannon Champion, MD;  Location: [facility];  Service: Neurosurgery;  Laterality: Left;   CRANIOTOMY Right 05/18/2021   Procedure: Right Orbitozygomatic craniotomy for tumor with brainlab;  Surgeon: Cannon Champion, MD;  Location: [facility];  Service: Neurosurgery;  Laterality: Right;   ESOPHAGOGASTRODUODENOSCOPY (EGD) WITH PROPOFOL  N/A 06/28/2022   Procedure: ESOPHAGOGASTRODUODENOSCOPY (EGD) WITH PROPOFOL ;  Surgeon: Annis Kinder, DO;  Location: [facility];  Service: Gastroenterology;  Laterality: N/A;   PARATHYROIDECTOMY Right 04/23/2023   Procedure: RIGHT INFERIOR PARATHYROIDECTOMY;  Surgeon: Oralee Billow, MD;  Location: [facility];  Service: General;  Laterality: Right;   POLYPECTOMY  06/28/2022   Procedure: POLYPECTOMY;  Surgeon: Annis Kinder, DO;  Location: [facility];  Service: Gastroenterology;;   SALPINGOOPHORECTOMY Bilateral 03/08/2022   Procedure: OPEN SALPINGO OOPHORECTOMY;  Surgeon: Keene Pastures, DO;  Location: [facility];  Service: Gynecology;  Laterality: Bilateral;   SHOULDER ARTHROSCOPY Right 11/06/2019   Procedure: ARTHROSCOPY RIGHT SHOULDER, DEBRIDEMENT X TWO STRUCTURES; OPEN ROTATOR CUFF REPAIR;  Surgeon: Darrin Emerald, MD;  Location: [facility];  Service: Orthopedics;  Laterality: Right;    Current Outpatient Medications  Medication Sig Dispense Refill   atorvastatin  (LIPITOR) 20  MG tablet Take 1 tablet (20 mg total) by mouth daily. 90 tablet 3   colestipol  (COLESTID ) 1 g tablet Take 1 tablet (1 g total) by mouth daily. 30 tablet 3   DULoxetine  (CYMBALTA ) 60 MG capsule Take 1 capsule (60 mg total) by mouth daily. 90 capsule 3   gabapentin  (NEURONTIN ) 600 MG tablet Take 1 tablet (600 mg total) by mouth 3 (three) times daily. 270 tablet 4   hydrocortisone  1 % ointment Apply 1 Application topically 2 (two) times daily as needed for itching.     ondansetron  (ZOFRAN -ODT) 4 MG disintegrating tablet Take 1 tablet (4 mg total) by mouth every 6 (six) hours as needed for nausea or vomiting. 15 tablet 0   Probiotic Product (PROBIOTIC DAILY PO) Take 1 tablet by mouth daily. Olly     No current facility-administered medications for this visit.    Allergies as of 11/22/2023 - Review Complete 11/22/2023  Allergen Reaction Noted   Gadolinium derivatives Other (See Comments) 12/19/2021   Saline Nausea And Vomiting 08/22/2021   Sodium chloride  Nausea And Vomiting 08/22/2021    Family History  Problem Relation Age of Onset   Cancer Mother    Hyperlipidemia Mother    Lung cancer Mother    Hypertension Father    Heart disease Father    Other Sister        meningiomas/tumors x11   Other Daughter  Lynch syndrome   Brain cancer Maternal Aunt        d. 50s-60s   Lung cancer Maternal Aunt    Bone cancer Maternal Aunt        d. 50s-60s   Liver cancer Paternal Uncle    Lung cancer Paternal Grandmother        d.85   Rectal cancer Neg Hx    Esophageal cancer Neg Hx    Stomach cancer Neg Hx    Breast cancer Neg Hx    Colon polyps Neg Hx    Colon cancer Neg Hx     Social History   Socioeconomic History   Marital status: Divorced    Spouse name: Not on file   Number of children: 2   Years of education: Not on file   Highest education level: Associate degree: occupational, Scientist, product/process development, or vocational program  Occupational History   Not on file  Tobacco Use   Smoking  status: Never   Smokeless tobacco: Never  Vaping Use   Vaping status: Never Used  Substance and Sexual Activity   Alcohol use: No   Drug use: Never   Sexual activity: Yes    Birth control/protection: Surgical    Comment: hysterectomy  Other Topics Concern   Not on file  Social History Narrative   Not on file   Social Drivers of Health   Financial Resource Strain: Low Risk  (09/09/2023)   Received from [facility]   Overall Financial Resource Strain (CARDIA)    Difficulty of Paying Living Expenses: Not hard at all  Food Insecurity: No Food Insecurity (09/09/2023)   [facility]   Hunger Vital Sign    Worried About Running Out of Food in the Last Year: Never true    Ran Out of Food in the Last Year: Never true  Transportation Needs: No Transportation Needs (09/09/2023)   Received from [facility] - Transportation    Lack of Transportation (Medical): No    Lack of Transportation (Non-Medical): No  Physical Activity: Insufficiently Active (09/09/2023)   Received from [facility]   Exercise Vital Sign    Days of Exercise per Week: 1 day    Minutes of Exercise per Session: 10 min  Stress: Stress Concern Present (09/09/2023)   Received from [facility] - Occupational Stress Questionnaire    Feeling of Stress : To some extent  Social Connections: Somewhat Isolated (09/09/2023)   Received from [facility]   Social Network    How would you rate your social network (family, work, friends)?: Restricted participation with some degree of social isolation  Intimate Partner Violence: Not At Risk (09/09/2023)   Received from [facility]   HITS    Over the last 12 months how often did your partner physically hurt you?: Never    Over the last 12 months how often did your partner insult you or talk down to you?: Never    Over the last 12 months how often did your partner threaten you with physical harm?: Never     Over the last 12 months how often did your partner scream or curse at you?: Never    Review of Systems:    Constitutional: No weight loss, fever, chills, weakness or fatigue HEENT: Eyes: No change in vision               Ears, Nose, Throat:  No change in hearing or congestion Skin:  No rash or itching Cardiovascular: No chest pain, chest pressure or palpitations   Respiratory: No SOB or cough Gastrointestinal: See HPI and otherwise negative Genitourinary: No dysuria or change in urinary frequency Neurological: No headache, dizziness or syncope Musculoskeletal: No new muscle or joint pain Hematologic: No bleeding or bruising Psychiatric: No history of depression or anxiety    Physical Exam:  Vital signs: BP 118/76 (BP Location: Left Arm, Patient Position: Sitting, Cuff Size: Normal)   Pulse 61   Ht 5\' 5"  (1.651 m)   Wt 167 lb 6 oz (75.9 kg)   BMI 27.85 kg/m   Constitutional: NAD, alert and cooperative Head:  Normocephalic and atraumatic. Eyes:   PEERL, EOMI. No icterus. Conjunctiva pink. Respiratory: Respirations even and unlabored. Lungs clear to auscultation bilaterally.   No wheezes, crackles, or rhonchi.  Cardiovascular:  Regular rate and rhythm. No peripheral edema, cyanosis or pallor.  Gastrointestinal:  Soft, nondistended, nontender. No rebound or guarding. Normal bowel sounds. No appreciable masses or hepatomegaly.  Left CVA tenderness Rectal:  Declines Msk:  Symmetrical without gross deformities. Without edema, no deformity or joint abnormality.  Neurologic:  Alert and  oriented x4;  grossly normal neurologically.  Skin:   Dry and intact without significant lesions or rashes. Psychiatric: Oriented to person, place and time. Demonstrates good judgement and reason without abnormal affect or behaviors.  RELEVANT LABS AND IMAGING: CBC    Component Value Date/Time   WBC 9.6 11/04/2023 1509   RBC 4.77 11/04/2023 1509   HGB 13.8 11/04/2023 1509   HGB 14.5 10/10/2022 1021    HCT 43.4 11/04/2023 1509   HCT 44.5 10/10/2022 1021   PLT 299 11/04/2023 1509   PLT 303 10/10/2022 1021   MCV 91.0 11/04/2023 1509   MCV 91 10/10/2022 1021   MCH 28.9 11/04/2023 1509   MCHC 31.8 11/04/2023 1509   RDW 13.7 11/04/2023 1509   RDW 13.4 10/10/2022 1021   LYMPHSABS 1.3 11/04/2023 1509   LYMPHSABS 1.4 10/10/2022 1021   MONOABS 0.6 11/04/2023 1509   EOSABS 0.1 11/04/2023 1509   EOSABS 0.1 10/10/2022 1021   BASOSABS 0.1 11/04/2023 1509   BASOSABS 0.0 10/10/2022 1021    CMP     Component Value Date/Time   NA 133 (L) 11/04/2023 1509   NA 139 07/06/2023 1518   K 4.0 11/04/2023 1509   CL 105 11/04/2023 1509   CO2 23 11/04/2023 1509   GLUCOSE 105 (H) 11/04/2023 1509   BUN 12 11/04/2023 1509   BUN 14 07/06/2023 1518   CREATININE 0.67 11/04/2023 1509   CREATININE 0.68 08/30/2012 1155   CALCIUM  9.3 11/04/2023 1509   PROT 7.2 11/04/2023 1509   PROT 6.8 10/10/2022 1021   ALBUMIN  4.0 11/04/2023 1509   ALBUMIN  4.4 10/10/2022 1021   AST 16 11/04/2023 1509   ALT 16 11/04/2023 1509   ALKPHOS 143 (H) 11/04/2023 1509   BILITOT 0.5 11/04/2023 1509   BILITOT 0.4 10/10/2022 1021   GFRNONAA >60 11/04/2023 1509   GFRNONAA >89 08/30/2012 1155   GFRAA 102 08/02/2020 1115   GFRAA >89 08/30/2012 1155     Assessment/Plan:   Chronic diarrhea post-cholecystectomy Chronic varying loose stool with negative infectious workup. Previous normal colonoscopy and endoscopy. Has always been an issue for her post cholecystectomy but now becoming more frequent. No improvement on benefiber, added in colestipol  with resolution of symptoms and now mild constipation - Can take colestipol  every other day to prevent constipation and continue to titrate as needed - Reassurance: -  Follow-up as needed  Nephrolithiasis CVA tenderness Previously seen at [facility] for pain earlier this month and found to have bilateral nephrolithiasis with largest calculus measuring 11 mm in right kidney.   Recurrent severe left CVA tenderness today and requesting repeat imaging.  Has not seen urology. - CT renal stone study - CBC/CMP - ED precautions - Refer to urology  Suzanna Erp, PA-C [facility] 11/22/2023, 11:35 AM  Cc: Eliodoro Guerin, DO

## 2023-11-22 NOTE — Patient Instructions (Signed)
 Your provider has requested that you go to the basement level for lab work before leaving today. Press "B" on the elevator. The lab is located at the first door on the left as you exit the elevator.  You have been scheduled for a CT scan at [facility], 1st floor Radiology.   Head over there after you go to the lab.  _______________________________________________________  If your blood pressure at your visit was 140/90 or greater, please contact your primary care physician to follow up on this.  _______________________________________________________  If you are age 51 or older, your body mass index should be between 23-30. Your Body mass index is 27.85 kg/m. If this is out of the aforementioned range listed, please consider follow up with your Primary Care Provider.  If you are age 42 or younger, your body mass index should be between 19-25. Your Body mass index is 27.85 kg/m. If this is out of the aformentioned range listed, please consider follow up with your Primary Care Provider.   ________________________________________________________  The [facility] providers would like to encourage you to use MYCHART to communicate with providers for non-urgent requests or questions.  Due to long hold times on the telephone, sending your provider a message by [facility] may be a faster and more efficient way to get a response.  Please allow 48 business hours for a response.  Please remember that this is for non-urgent requests.  _______________________________________________________

## 2023-11-25 ENCOUNTER — Encounter (HOSPITAL_COMMUNITY): Payer: Self-pay

## 2023-11-25 ENCOUNTER — Other Ambulatory Visit: Payer: Self-pay

## 2023-11-25 ENCOUNTER — Emergency Department (HOSPITAL_COMMUNITY)
Admission: EM | Admit: 2023-11-25 | Discharge: 2023-11-26 | Disposition: A | Discharge status: Home / Self Care | Attending: Emergency Medicine | Admitting: Emergency Medicine

## 2023-11-25 DIAGNOSIS — R1013 Epigastric pain: Secondary | ICD-10-CM | POA: Diagnosis present

## 2023-11-25 DIAGNOSIS — N200 Calculus of kidney: Secondary | ICD-10-CM | POA: Diagnosis present

## 2023-11-25 LAB — CBC WITH DIFFERENTIAL/PLATELET
Abs Immature Granulocytes: 0.04 10*3/uL (ref 0.00–0.07)
Basophils Absolute: 0.1 10*3/uL (ref 0.0–0.1)
Basophils Relative: 1 %
Eosinophils Absolute: 0.1 10*3/uL (ref 0.0–0.5)
Eosinophils Relative: 1 %
HCT: 38.1 % (ref 36.0–46.0)
Hemoglobin: 12.5 g/dL (ref 12.0–15.0)
Immature Granulocytes: 0 %
Lymphocytes Relative: 12 %
Lymphs Abs: 1.3 10*3/uL (ref 0.7–4.0)
MCH: 29.2 pg (ref 26.0–34.0)
MCHC: 32.8 g/dL (ref 30.0–36.0)
MCV: 89 fL (ref 80.0–100.0)
Monocytes Absolute: 0.6 10*3/uL (ref 0.1–1.0)
Monocytes Relative: 6 %
Neutro Abs: 8.7 10*3/uL — ABNORMAL HIGH (ref 1.7–7.7)
Neutrophils Relative %: 80 %
Platelets: 315 10*3/uL (ref 150–400)
RBC: 4.28 MIL/uL (ref 3.87–5.11)
RDW: 13.8 % (ref 11.5–15.5)
WBC: 10.8 10*3/uL — ABNORMAL HIGH (ref 4.0–10.5)
nRBC: 0 % (ref 0.0–0.2)

## 2023-11-25 LAB — URINALYSIS, ROUTINE W REFLEX MICROSCOPIC
Bacteria, UA: NONE SEEN
Bilirubin Urine: NEGATIVE
Glucose, UA: NEGATIVE mg/dL
Ketones, ur: NEGATIVE mg/dL
Nitrite: NEGATIVE
Protein, ur: NEGATIVE mg/dL
Specific Gravity, Urine: 1.013 (ref 1.005–1.030)
WBC, UA: >50 WBC/hpf (ref 0–5)
pH: 7 (ref 5.0–8.0)

## 2023-11-25 LAB — COMPREHENSIVE METABOLIC PANEL WITH GFR
ALT: 19 U/L (ref 0–44)
AST: 20 U/L (ref 15–41)
Albumin: 3.7 g/dL (ref 3.5–5.0)
Alkaline Phosphatase: 109 U/L (ref 38–126)
Anion gap: 10 (ref 5–15)
BUN: 19 mg/dL (ref 6–20)
CO2: 22 mmol/L (ref 22–32)
Calcium: 9.4 mg/dL (ref 8.9–10.3)
Chloride: 106 mmol/L (ref 98–111)
Creatinine, Ser: 0.84 mg/dL (ref 0.44–1.00)
GFR, Estimated: >60 mL/min (ref >60)
Glucose, Bld: 117 mg/dL — ABNORMAL HIGH (ref 70–99)
Potassium: 4.2 mmol/L (ref 3.5–5.1)
Sodium: 138 mmol/L (ref 135–145)
Total Bilirubin: 0.6 mg/dL (ref 0.0–1.2)
Total Protein: 6.4 g/dL — ABNORMAL LOW (ref 6.5–8.1)

## 2023-11-25 MED ORDER — ONDANSETRON HCL 4 MG/2ML IJ SOLN
4.0000 mg | Freq: Once | INTRAMUSCULAR | Status: AC
  Administered 2023-11-25: 4 mg via INTRAVENOUS
  Filled 2023-11-25: qty 2

## 2023-11-25 MED ORDER — OXYCODONE-ACETAMINOPHEN 5-325 MG PO TABS: 1.0000 | ORAL_TABLET | Freq: Four times a day (QID) | ORAL | 0 refills | Status: DC | PRN

## 2023-11-25 MED ORDER — KETOROLAC TROMETHAMINE 30 MG/ML IJ SOLN
30.0000 mg | Freq: Once | INTRAMUSCULAR | Status: AC
  Administered 2023-11-25: 30 mg via INTRAVENOUS
  Filled 2023-11-25: qty 1

## 2023-11-25 MED ORDER — HYDROMORPHONE HCL 1 MG/ML IJ SOLN
1.0000 mg | Freq: Once | INTRAMUSCULAR | Status: AC
  Administered 2023-11-25: 1 mg via INTRAVENOUS
  Filled 2023-11-25: qty 1

## 2023-11-25 MED ORDER — ONDANSETRON 4 MG PO TBDP
4.0000 mg | ORAL_TABLET | Freq: Three times a day (TID) | ORAL | 0 refills | Status: AC | PRN
Start: 2023-11-25 — End: ?

## 2023-11-25 MED ORDER — NAPROXEN 500 MG PO TABS: 500.0000 mg | ORAL_TABLET | Freq: Two times a day (BID) | ORAL | 0 refills | Status: DC

## 2023-11-25 MED ORDER — TAMSULOSIN HCL 0.4 MG PO CAPS
0.4000 mg | ORAL_CAPSULE | Freq: Every day | ORAL | 0 refills | Status: DC
Start: 2023-11-25 — End: 2023-12-02

## 2023-11-25 NOTE — Discharge Instructions (Signed)
 Percocet may be taken 1 tablet every 6 hours as needed for pain, you will need to see the urologist to have this fixed as the kidney stone is too big to pass by itself  Please take Naprosyn , 500mg  by mouth twice daily as needed for pain - this in an antiinflammatory medicine (NSAID) and is similar to ibuprofen  - many people feel that it is stronger than ibuprofen  and it is easier to take since it is a smaller pill.  Please use this only for 1 week - if your pain persists, you will need to follow up with your doctor in the office for ongoing guidance and pain control.  Zofran  is a medication which can help with nausea.  You may take 4 mg by mouth every 6 hours as needed if you are an adult, if your child under the age of 6 take half of a tablet or 2 mg every 6 hours as needed.  This should dissolve on your tongue within a short timeframe.  Wait about 30 minutes after taking it to help with drinking clear liquids.  Flomax once a day for 7 days  Emergency department for severe worsening symptoms

## 2023-11-25 NOTE — ED Triage Notes (Signed)
 Pt BIB [facility] due to flank pain that started earlier today. Pt stated that she was just here and diagnosed with bilat kidney stones and believes this is related. Pt has 10/10 pain that is constant.

## 2023-11-25 NOTE — ED Provider Notes (Signed)
 [place] EMERGENCY DEPARTMENT AT [facility] Provider Note   CSN: [id number] Arrival date & time: 11/25/23  2054     History  Chief Complaint  Patient presents with   Flank Pain    Laurie Kelley is a 53 y.o. female.   Flank Pain   This patient is a 53 year old female with a known history of high cholesterol, she has also recently been diagnosed with kidney stones and states that she does have a history of kidney stones but in the last several weeks she has developed some abdominal pain, mostly flank pain which is radiated between the right side and the left side, she was initially seen on 18 May in the emergency department, the course of that visit for the flank pain showed that the patient had in fact a urinary tract infection with many bacteria and large leukocytes, she had preserved renal function, she had a CT scan which showed bilateral nonobstructing kidney stones in the kidneys but no hydronephrosis.  She was treated with antibiotics, she was given Vantin  Zofran  and Naprosyn  and seem to get better, then presented to her gastroenterologist office several days ago with left-sided flank pain and requested that a CT scan be performed.  The CT scan was read as no significant changes, bilateral stable nephrolithiasis without hydronephrosis.  The patient seemed to get a little bit better but today comes in with acute right-sided flank pain that started several hours ago.  She did not have the symptoms when she woke up this morning.  The pain seems to get worse at times no matter what she does but sometimes gets worse when she moves, associated nausea but no diaphoresis.  No dysuria hematuria or diarrhea.    Home Medications Prior to Admission medications   Medication Sig Start Date End Date Taking? Authorizing Provider  naproxen  (NAPROSYN ) 500 MG tablet Take 1 tablet (500 mg total) by mouth 2 (two) times daily with a meal. 11/25/23  Yes Early Glisson, MD  ondansetron   (ZOFRAN -ODT) 4 MG disintegrating tablet Take 1 tablet (4 mg total) by mouth every 8 (eight) hours as needed for nausea. 11/25/23  Yes Early Glisson, MD  oxyCODONE -acetaminophen  (PERCOCET) 5-325 MG tablet Take 1 tablet by mouth every 6 (six) hours as needed. 11/25/23  Yes Early Glisson, MD  oxyCODONE -acetaminophen  (PERCOCET/ROXICET) 5-325 MG tablet Take 1 tablet by mouth every 6 (six) hours as needed for severe pain (pain score 7-10). 11/25/23  Yes Early Glisson, MD  tamsulosin (FLOMAX) 0.4 MG CAPS capsule Take 1 capsule (0.4 mg total) by mouth daily for 7 days. 11/25/23 12/02/23 Yes Early Glisson, MD  atorvastatin  (LIPITOR) 20 MG tablet Take 1 tablet (20 mg total) by mouth daily. 02/27/23   Eliodoro Guerin, DO  colestipol  (COLESTID ) 1 g tablet Take 1 tablet (1 g total) by mouth daily. 10/18/23   McMichael, Elba Greathouse, PA-C  DULoxetine  (CYMBALTA ) 60 MG capsule Take 1 capsule (60 mg total) by mouth daily. 08/27/23   Eliodoro Guerin, DO  gabapentin  (NEURONTIN ) 600 MG tablet Take 1 tablet (600 mg total) by mouth 3 (three) times daily. 08/27/23   Eliodoro Guerin, DO  hydrocortisone  1 % ointment Apply 1 Application topically 2 (two) times daily as needed for itching.    [provider]  Probiotic Product (PROBIOTIC DAILY PO) Take 1 tablet by mouth daily. Olly    [provider]      Allergies    Gadolinium derivatives, Iodinated contrast media, Saline, and Sodium chloride   Review of Systems   Review of Systems  Genitourinary:  Positive for flank pain.  All other systems reviewed and are negative.   Physical Exam Updated Vital Signs BP 139/79   Pulse (!) 58   Temp 98.4 F (36.9 C)   Resp 18   Ht 1.651 m (5\' 5" )   Wt 76 kg   SpO2 99%   BMI 27.88 kg/m  Physical Exam Vitals and nursing note reviewed.  Constitutional:      Appearance: She is well-developed.     Comments: Uncomfortable appearing  HENT:     Head: Normocephalic and atraumatic.     Mouth/Throat:     Pharynx:  No oropharyngeal exudate.  Eyes:     General: No scleral icterus.       Right eye: No discharge.        Left eye: No discharge.     Conjunctiva/sclera: Conjunctivae normal.     Pupils: Pupils are equal, round, and reactive to light.  Neck:     Thyroid : No thyromegaly.     Vascular: No JVD.  Cardiovascular:     Rate and Rhythm: Normal rate and regular rhythm.     Heart sounds: Normal heart sounds. No murmur heard.    No friction rub. No gallop.  Pulmonary:     Effort: Pulmonary effort is normal. No respiratory distress.     Breath sounds: Normal breath sounds. No wheezing or rales.  Abdominal:     General: Bowel sounds are normal. There is no distension.     Palpations: Abdomen is soft. There is no mass.     Tenderness: There is no abdominal tenderness.  Musculoskeletal:        General: No tenderness. Normal range of motion.     Cervical back: Normal range of motion and neck supple.     Right lower leg: No edema.     Left lower leg: No edema.     Comments: CVA tenderness present  Lymphadenopathy:     Cervical: No cervical adenopathy.  Skin:    General: Skin is warm and dry.     Findings: No erythema or rash.  Neurological:     Mental Status: She is alert.     Coordination: Coordination normal.  Psychiatric:        Behavior: Behavior normal.     ED Results / Procedures / Treatments   Labs (all labs ordered are listed, but only abnormal results are displayed) Labs Reviewed  CBC WITH DIFFERENTIAL/PLATELET - Abnormal; Notable for the following components:      Result Value   WBC 10.8 (*)    Neutro Abs 8.7 (*)    All other components within normal limits  COMPREHENSIVE METABOLIC PANEL WITH GFR - Abnormal; Notable for the following components:   Glucose, Bld 117 (*)    Total Protein 6.4 (*)    All other components within normal limits  URINALYSIS, ROUTINE W REFLEX MICROSCOPIC - Abnormal; Notable for the following components:   Color, Urine STRAW (*)    Hgb urine  dipstick MODERATE (*)    Leukocytes,Ua MODERATE (*)    All other components within normal limits    EKG None  Radiology No results found.  Procedures Procedures    Medications Ordered in ED Medications  ketorolac  (TORADOL ) 30 MG/ML injection 30 mg (30 mg Intravenous Given 11/25/23 2143)  HYDROmorphone  (DILAUDID ) injection 1 mg (1 mg Intravenous Given 11/25/23 2142)  ondansetron  (ZOFRAN ) injection 4 mg (4 mg Intravenous Given 11/25/23  2142)    ED Course/ Medical Decision Making/ A&P                                 Medical Decision Making Amount and/or Complexity of Data Reviewed Labs: ordered.  Risk Prescription drug management.    This patient presents to the ED for concern of flank pain differential diagnosis includes skill skeletal cause, could be renal cause, has known bilateral kidney stones, I have personally looked at the prior CT scans and confirmed that the patient had nonobstructive uropathy as of several days ago.    Additional history obtained   Additional history obtained from Electronic Medical Record External records from outside source obtained and reviewed including CT scan as ordered by her doctors   Lab Tests:  I Ordered, and personally interpreted labs.  The pertinent results include: CBC unremarkable, metabolic panel reassuring, urinalysis has red and white cells but no bacteria, culture will be sent   Medicines ordered and prescription drug management:  I ordered medication including dilaudid  and toradol  Significant improvement    I have reviewed the patients home medicines and have made adjustments as needed   Problem List / ED Course:  Kidney stone pain likely Stable for d/c Pt agreeable Local urology referral   Social Determinants of Health:  none           Final Clinical Impression(s) / ED Diagnoses Final diagnoses:  Kidney stone on right side    Rx / DC Orders ED Discharge Orders          Ordered     oxyCODONE -acetaminophen  (PERCOCET/ROXICET) 5-325 MG tablet  Every 6 hours PRN        11/25/23 2339    tamsulosin (FLOMAX) 0.4 MG CAPS capsule  Daily        11/25/23 2342    naproxen  (NAPROSYN ) 500 MG tablet  2 times daily with meals        11/25/23 2342    ondansetron  (ZOFRAN -ODT) 4 MG disintegrating tablet  Every 8 hours PRN        11/25/23 2342    oxyCODONE -acetaminophen  (PERCOCET) 5-325 MG tablet  Every 6 hours PRN        11/25/23 2342              Early Glisson, MD 11/25/23 2344

## 2023-11-26 ENCOUNTER — Ambulatory Visit (HOSPITAL_COMMUNITY)
Admission: RE | Admit: 2023-11-26 | Discharge: 2023-11-26 | Disposition: A | Discharge status: Home / Self Care | Source: Ambulatory Visit | Attending: Urology

## 2023-11-26 ENCOUNTER — Other Ambulatory Visit: Payer: Self-pay

## 2023-11-26 ENCOUNTER — Ambulatory Visit (INDEPENDENT_AMBULATORY_CARE_PROVIDER_SITE_OTHER): Admitting: Urology

## 2023-11-26 ENCOUNTER — Encounter: Payer: Self-pay | Admitting: Urology

## 2023-11-26 VITALS — BP 106/58 | HR 61

## 2023-11-26 DIAGNOSIS — N200 Calculus of kidney: Secondary | ICD-10-CM | POA: Insufficient documentation

## 2023-11-26 MED ORDER — ONDANSETRON HCL 4 MG PO TABS: 4.0000 mg | ORAL_TABLET | Freq: Three times a day (TID) | ORAL | 0 refills | Status: DC | PRN

## 2023-11-26 MED ORDER — OXYCODONE-ACETAMINOPHEN 5-325 MG PO TABS: 2.0000 | ORAL_TABLET | ORAL | 0 refills | Status: DC | PRN

## 2023-11-26 MED ORDER — OXYCODONE-ACETAMINOPHEN 5-325 MG PO TABS: 1.0000 | ORAL_TABLET | Freq: Four times a day (QID) | ORAL | 0 refills | Status: DC | PRN

## 2023-11-26 MED ORDER — TAMSULOSIN HCL 0.4 MG PO CAPS: 0.4000 mg | ORAL_CAPSULE | Freq: Every day | ORAL | 0 refills | Status: DC

## 2023-11-26 NOTE — ED Notes (Signed)
 Gave pt take home percocet prepack Lot #: [id number]  EX: 01/17/2027  6 tab total

## 2023-11-26 NOTE — Patient Instructions (Signed)
 ESWL for Kidney Stones  Extracorporeal shock wave lithotripsy (ESWL) is a treatment that can help break up kidney stones that are too large to pass on their own.  This is a nonsurgical procedure that breaks up a kidney stone with shock waves. These shock waves pass through your body and focus on the kidney stone. They cause the kidney stone to break into smaller pieces (fragments) while it is still in the urinary tract. The fragments of stone can pass more easily out of your body in the pee (urine). Tell a health care provider about: Any allergies you have. All medicines you are taking, including vitamins, herbs, eye drops, creams, and over-the-counter medicines. Any problems you or family members have had with anesthetic medicines. Any bleeding problems you have. Any surgeries you've had. Any medical conditions you have. Whether you're pregnant or may be pregnant. What are the risks? Your health care provider will talk with you about risks. These may include: Infection. Bleeding from the kidney. Bruising of the kidney or skin. Scarring of the kidney. This can lead to: Increased blood pressure. Poor kidney function. Return (recurrence) of kidney stones. Damage to other structures or organs. This may include the liver, colon, spleen, or pancreas. Blockage (obstruction) of the tube that carries pee from the kidney to the bladder (ureter). Failure of the kidney stone to break into fragments. What happens before the procedure? When to stop eating and drinking Follow instructions from your health care provider about what you may eat and drink. These may include: 8 hours before your procedure Stop eating most foods. Do not eat meat, fried foods, or fatty foods. Eat only light foods, such as toast or crackers. All liquids are okay except energy drinks and alcohol. 6 hours before your procedure Stop eating. Drink only clear liquids, such as water, clear fruit juice, black coffee, plain tea,  and sports drinks. Do not drink energy drinks or alcohol. 2 hours before your procedure Stop drinking all liquids. You may be allowed to take medicines with small sips of water. If you do not follow your health care provider's instructions, your procedure may be delayed or canceled. Medicines Ask your health care provider about: Changing or stopping your regular medicines. These include any diabetes medicines or blood thinners you take. Taking medicines such as aspirin and ibuprofen. These medicines can thin your blood. Do not take them unless your health care provider tells you to. Taking over-the-counter medicines, vitamins, herbs, and supplements. Tests You may have tests, such as: Blood tests. Pee (urine) tests. Imaging tests. This may include a CT scan. Surgery safety Ask your health care provider: How your surgery site will be marked. What steps will be taken to help prevent infection. These steps may include: Washing skin with a soap that kills germs. Receiving antibiotics. General instructions If you will be going home right after the procedure, plan to have a responsible adult: Take you home from the hospital or clinic. You will not be allowed to drive. Care for you for the time you are told. What happens during the procedure?  An IV will be inserted into one of your veins. You may be given: A sedative. This helps you relax. Anesthesia. This will: Numb certain areas of your body. Make you fall asleep for surgery. A water-filled cushion may be placed behind your kidney or on your abdomen. In some cases, you may be placed in a tub of lukewarm water. Your body will be positioned in a way that makes it  easier to target the kidney stone. An X-ray or ultrasound exam will be done to locate your stone. Shock waves will be aimed at the stone. If you are awake, you may feel a tapping sensation as the shock waves pass through your body. A small mesh tube (stent) may be placed in  your ureter. This will help keep pee flowing from the kidney if the fragments of the stone have been blocking the ureter. The stent will be removed at a later time by your health care provider. The procedure may vary among health care providers and hospitals. What happens after the procedure? Your blood pressure, heart rate, breathing rate, and blood oxygen level will be monitored until you leave the hospital or clinic. You may have an X-ray after the procedure to see how many of the kidney stones were broken up. This will also show how much of the stone has passed. If there are still large fragments after treatment, you may need to have a second procedure at a later time. This information is not intended to replace advice given to you by your health care provider. Make sure you discuss any questions you have with your health care provider. Document Revised: 12/16/2022 Document Reviewed: 10/06/2021 Elsevier Patient Education  2024 [employer].

## 2023-11-26 NOTE — H&P (View-Only) (Signed)
 11/26/2023 11:44 AM   Laurie Kelley Laurie Kelley Nov 21, 1970 [id number]  Referring provider: Eliodoro Guerin, DO [address] [place],  [place] [address]  Nephrolithiasis   HPI: [name] is a 52yo here for evaluation of nephrolithiasis. She had her first stone event 6 months ago. She does no know if she passed the calculus. The pain subsided and she was doing well until 3 weeks ago when she developed worsening right flank pain. CT from 6/5 shows a 9mm right renal pelvis calculus which was confirmed with KUB today. She denies any fevers chills. No nausea vomiting.    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   Cancer (HCC)    menigiomas brain   Chest pain in adult 07/31/2017   Family history of brain tumor    Family history of brain tumor 06/27/2021   Gallstones    Headache    Hyperlipidemia with target LDL less than 100 08/18/2015   Hyperparathyroidism (HCC)    Internal hemorrhoids 08/18/2014   Lynch syndrome    PONV (postoperative nausea and vomiting)     Surgical History: Past Surgical History:  Procedure Laterality Date   ABDOMINAL HYSTERECTOMY N/A 03/08/2022   Procedure: HYSTERECTOMY ABDOMINAL;  Surgeon: Keene Pastures, DO;  Location: [facility];  Service: Gynecology;  Laterality: N/A;   ADJACENT TISSUE TRANSFER/TISSUE REARRANGEMENT Right 11/22/2022   Procedure: Fat grafting to right  temple from right buttocks after brain surgery;  Surgeon: Thornell Flirt, DO;  Location: [facility];  Service: Plastics;  Laterality: Right;   APPLICATION OF CRANIAL NAVIGATION Left 02/18/2021   Procedure: APPLICATION OF CRANIAL NAVIGATION;  Surgeon: Cannon Champion, MD;  Location: [facility];  Service: Neurosurgery;  Laterality: Left;   APPLICATION OF CRANIAL NAVIGATION N/A 05/18/2021   Procedure: APPLICATION OF CRANIAL NAVIGATION;  Surgeon: Cannon Champion, MD;   Location: [facility];  Service: Neurosurgery;  Laterality: N/A;   BIOPSY  06/28/2022   Procedure: BIOPSY;  Surgeon: Annis Kinder, DO;  Location: [facility];  Service: Gastroenterology;;   BRAIN SURGERY     CHOLECYSTECTOMY  1993   COLONOSCOPY WITH PROPOFOL  N/A 06/28/2022   Procedure: COLONOSCOPY WITH PROPOFOL ;  Surgeon: Annis Kinder, DO;  Location: [facility];  Service: Gastroenterology;  Laterality: N/A;   CRANIOTOMY Left 02/18/2021   Procedure: Left orbitozygomatic craniotomy for tumor resection  with Brain Lab;  Surgeon: Cannon Champion, MD;  Location[facility];  Service: Neurosurgery;  Laterality: Left;   CRANIOTOMY Right 05/18/2021   Procedure: Right Orbitozygomatic craniotomy for tumor with brainlab;  Surgeon: Cannon Champion, MD;  Location: [facility];  Service: Neurosurgery;  Laterality: Right;   ESOPHAGOGASTRODUODENOSCOPY (EGD) WITH PROPOFOL  N/A 06/28/2022   Procedure: ESOPHAGOGASTRODUODENOSCOPY (EGD) WITH PROPOFOL ;  Surgeon: Annis Kinder, DO;  Location: [facility];  Service: Gastroenterology;  Laterality: N/A;   PARATHYROIDECTOMY Right 04/23/2023   Procedure: RIGHT INFERIOR PARATHYROIDECTOMY;  Surgeon: Oralee Billow, MD;  Location: [facility];  Service: General;  Laterality: Right;   POLYPECTOMY  06/28/2022   Procedure: POLYPECTOMY;  Surgeon: Annis Kinder, DO;  Location: [facility];  Service: Gastroenterology;;   SALPINGOOPHORECTOMY Bilateral 03/08/2022   Procedure: OPEN SALPINGO OOPHORECTOMY;  Surgeon: Keene Pastures, DO;  Location: [facility];  Service: Gynecology;  Laterality: Bilateral;   SHOULDER ARTHROSCOPY Right 11/06/2019   Procedure: ARTHROSCOPY RIGHT SHOULDER, DEBRIDEMENT X TWO STRUCTURES; OPEN ROTATOR  CUFF REPAIR;  Surgeon: Darrin Emerald, MD;  Location: [facility];  Service: Orthopedics;  Laterality: Right;    Home Medications:  Allergies as of 11/26/2023       Reactions   Gadolinium Derivatives Other (See Comments)   Pt reports she developed chest  tightness, burning of her back, and weakness after MRI contrast 11/2021. Per Dr. Vanda Gemma, he suggests 13 hr protocol prior to MRI.    Iodinated Contrast Media Nausea And Vomiting   Saline Nausea And Vomiting   Sodium Chloride  Nausea And Vomiting        Medication List        Accurate as of November 26, 2023 11:44 AM. If you have any questions, ask your nurse or doctor.          atorvastatin  20 MG tablet Commonly known as: LIPITOR Take 1 tablet (20 mg total) by mouth daily.   colestipol  1 g tablet Commonly known as: Colestid  Take 1 tablet (1 g total) by mouth daily.   DULoxetine  60 MG capsule Commonly known as: Cymbalta  Take 1 capsule (60 mg total) by mouth daily.   gabapentin  600 MG tablet Commonly known as: NEURONTIN  Take 1 tablet (600 mg total) by mouth 3 (three) times daily.   hydrocortisone  1 % ointment Apply 1 Application topically 2 (two) times daily as needed for itching.   naproxen  500 MG tablet Commonly known as: Naprosyn  Take 1 tablet (500 mg total) by mouth 2 (two) times daily with a meal.   ondansetron  4 MG disintegrating tablet Commonly known as: ZOFRAN -ODT Take 1 tablet (4 mg total) by mouth every 8 (eight) hours as needed for nausea.   oxyCODONE -acetaminophen  5-325 MG tablet Commonly known as: PERCOCET/ROXICET Take 1 tablet by mouth every 6 (six) hours as needed for severe pain (pain score 7-10).   oxyCODONE -acetaminophen  5-325 MG tablet Commonly known as: Percocet Take 1 tablet by mouth every 6 (six) hours as needed.   PROBIOTIC DAILY PO Take 1 tablet by mouth daily. Olly   tamsulosin 0.4 MG Caps capsule Commonly known as: FLOMAX Take 1 capsule (0.4 mg total) by mouth daily for 7 days.        Allergies:  Allergies  Allergen Reactions   Gadolinium Derivatives Other (See Comments)    Pt reports she developed chest tightness, burning of her back, and weakness after MRI contrast 11/2021. Per Dr. Vanda Gemma, he suggests 13 hr protocol prior to MRI.     Iodinated Contrast Media Nausea And Vomiting   Saline Nausea And Vomiting   Sodium Chloride  Nausea And Vomiting    Family History: Family History  Problem Relation Age of Onset   Cancer Mother    Hyperlipidemia Mother    Lung cancer Mother    Hypertension Father    Heart disease Father    Other Sister        meningiomas/tumors x11   Other Daughter        Lynch syndrome   Brain cancer Maternal Aunt        d. 45s-60s   Lung cancer Maternal Aunt    Bone cancer Maternal Aunt        d. 50s-60s   Liver cancer Paternal Uncle    Lung cancer Paternal Grandmother        d.85   Rectal cancer Neg Hx    Esophageal cancer Neg Hx    Stomach cancer Neg Hx    Breast cancer Neg Hx    Colon polyps Neg Hx  Colon cancer Neg Hx     Social History:  reports that she has never smoked. She has never used smokeless tobacco. She reports that she does not drink alcohol and does not use drugs.  ROS: All other review of systems were reviewed and are negative except what is noted above in HPI  Physical Exam: BP (!) 106/58   Pulse 61   Constitutional:  Alert and oriented, No acute distress. HEENT: [place] AT, moist mucus membranes.  Trachea midline, no masses. Cardiovascular: No clubbing, cyanosis, or edema. Respiratory: Normal respiratory effort, no increased work of breathing. GI: Abdomen is soft, nontender, nondistended, no abdominal masses GU: No CVA tenderness.  Lymph: No cervical or inguinal lymphadenopathy. Skin: No rashes, bruises or suspicious lesions. Neurologic: Grossly intact, no focal deficits, moving all 4 extremities. Psychiatric: Normal mood and affect.  Laboratory Data: Lab Results  Component Value Date   WBC 10.8 (H) 11/25/2023   HGB 12.5 11/25/2023   HCT 38.1 11/25/2023   MCV 89.0 11/25/2023   PLT 315 11/25/2023    Lab Results  Component Value Date   CREATININE 0.84 11/25/2023    No results found for: "PSA"  No results found for: "TESTOSTERONE"  Lab Results   Component Value Date   HGBA1C 5.3 02/27/2023    Urinalysis    Component Value Date/Time   COLORURINE STRAW (A) 11/25/2023 2146   APPEARANCEUR CLEAR 11/25/2023 2146   APPEARANCEUR Cloudy (A) 08/27/2023 1100   LABSPEC 1.013 11/25/2023 2146   PHURINE 7.0 11/25/2023 2146   GLUCOSEU NEGATIVE 11/25/2023 2146   HGBUR MODERATE (A) 11/25/2023 2146   BILIRUBINUR NEGATIVE 11/25/2023 2146   BILIRUBINUR Negative 08/27/2023 1100   KETONESUR NEGATIVE 11/25/2023 2146   PROTEINUR NEGATIVE 11/25/2023 2146   UROBILINOGEN negative 08/18/2015 1234   NITRITE NEGATIVE 11/25/2023 2146   LEUKOCYTESUR MODERATE (A) 11/25/2023 2146    Lab Results  Component Value Date   LABMICR See below: 08/27/2023   WBCUA >30 (A) 08/27/2023   RBCUA 3-10 (A) 04/11/2018   LABEPIT 0-10 08/27/2023   MUCUS Present (A) 08/27/2023   BACTERIA NONE SEEN 11/25/2023    Pertinent Imaging: CT 6/5 and KUb today: Images reviewed and discussed with the patient  Results for orders placed in visit on 03/04/14  DG Abd 1 View  Narrative CLINICAL DATA:  One week history of lower abdominal pain  EXAM: ABDOMEN - 1 VIEW  COMPARISON:  None.  FINDINGS: There is increased stool burden throughout the colon. There is no evidence of a small or large bowel obstruction. No free extraluminal gas collections are demonstrated. There are is an approximately 6 mm diameter calcification projecting over the mid to upper pole of the right kidney which may reflect a stone. The gallbladder is surgically absent. There are numerous pelvic phleboliths. The bony structures are unremarkable.  IMPRESSION: 1. Increased stool burden throughout the colon suggests constipation. There is no bowel abnormality otherwise. 2. Possible right-sided kidney stone.   Electronically Signed By: David  [place] On: 03/05/2014 08:20  No results found for this or any previous visit.  No results found for this or any previous visit.  No results found for  this or any previous visit.  No results found for this or any previous visit.  No results found for this or any previous visit.  No results found for this or any previous visit.  Results for orders placed during the hospital encounter of 11/22/23  CT RENAL STONE STUDY  Narrative CLINICAL DATA:  Left flank pain,  history of kidney stones  EXAM: CT ABDOMEN AND PELVIS WITHOUT CONTRAST  TECHNIQUE: Multidetector CT imaging of the abdomen and pelvis was performed following the standard protocol without IV contrast.  RADIATION DOSE REDUCTION: This exam was performed according to the departmental dose-optimization program which includes automated exposure control, adjustment of the mA and/or kV according to patient size and/or use of iterative reconstruction technique.  COMPARISON:  Nov 04, 2023  FINDINGS: Lower chest: No acute abnormality.  Hepatobiliary: No focal liver abnormality is seen. Status post cholecystectomy. No biliary dilatation.  Pancreas: Unremarkable. No pancreatic ductal dilatation or surrounding inflammatory changes.  Spleen: Normal in size without focal abnormality.  Adrenals/Urinary Tract: Adrenal glands are normal.  Kidneys are normal size. Comparison with prior examinations demonstrates no significant change in the bilateral nephrolithiasis with a 9 mm stone in the right renal pelvis without hydronephrosis. Several calcifications in the lower pole calices of the left kidney consistent with loosely formed staghorn calculus unchanged since prior examination. No ureteral stones no hydronephrosis no bladder stones  Stomach/Bowel: Stomach is within normal limits. Appendix appears normal. No evidence of bowel wall thickening, distention, or inflammatory changes.  Vascular/Lymphatic: No significant vascular findings are present. No enlarged abdominal or pelvic lymph nodes.  Reproductive: Uterus and bilateral adnexa are unremarkable.  Other: No  abdominal wall hernia or abnormality. No abdominopelvic ascites.  Musculoskeletal: No acute or significant osseous findings.  IMPRESSION: *No acute abnormality of the abdomen or pelvis. *Bilateral nephrolithiasis without hydronephrosis. *Status post cholecystectomy.   Electronically Signed By: Fredrich Jefferson M.D. On: 11/22/2023 12:42   Assessment & Plan:    1. Kidney stones (Primary) -We discussed the management of kidney stones. These options include observation, ureteroscopy, shockwave lithotripsy (ESWL) and percutaneous nephrolithotomy (PCNL). We discussed which options are relevant to the patient's stone(s). We discussed the natural history of kidney stones as well as the complications of untreated stones and the impact on quality of life without treatment as well as with each of the above listed treatments. We also discussed the efficacy of each treatment in its ability to clear the stone burden. With any of these management options I discussed the signs and symptoms of infection and the need for emergent treatment should these be experienced. For each option we discussed the ability of each procedure to clear the patient of their stone burden.   For observation I described the risks which include but are not limited to silent renal damage, life-threatening infection, need for emergent surgery, failure to pass stone and pain.   For ureteroscopy I described the risks which include bleeding, infection, damage to contiguous structures, positioning injury, ureteral stricture, ureteral avulsion, ureteral injury, need for prolonged ureteral stent, inability to perform ureteroscopy, need for an interval procedure, inability to clear stone burden, stent discomfort/pain, heart attack, stroke, pulmonary embolus and the inherent risks with general anesthesia.   For shockwave lithotripsy I described the risks which include arrhythmia, kidney contusion, kidney hemorrhage, need for transfusion, pain,  inability to adequately break up stone, inability to pass stone fragments, Steinstrasse, infection associated with obstructing stones, need for alternate surgical procedure, need for repeat shockwave lithotripsy, MI, CVA, PE and the inherent risks with anesthesia/conscious sedation.   For PCNL I described the risks including positioning injury, pneumothorax, hydrothorax, need for chest tube, inability to clear stone burden, renal laceration, arterial venous fistula or malformation, need for embolization of kidney, loss of kidney or renal function, need for repeat procedure, need for prolonged nephrostomy tube, ureteral avulsion, MI,  CVA, PE and the inherent risks of general anesthesia.   - The patient would like to proceed with right ESWL - Urinalysis, Routine w reflex microscopic   No follow-ups on file.  [name], MD  [facility] [place]

## 2023-11-26 NOTE — ED Notes (Signed)
 Pt/family received d/c paperwork at this time. After going over the paperwork any questions, comments, or concerns were answered to the best of this nurse's knowledge. The pt/family verbally acknowledged the teachings/instructions.

## 2023-11-26 NOTE — Progress Notes (Signed)
 11/26/2023 11:44 AM   Laurie Kelley Laurie Kelley Nov 21, 1970 [id number]  Referring provider: Eliodoro Guerin, DO [address] [place],  [place] [address]  Nephrolithiasis   HPI: Ms Laurie Kelley is a 53yo here for evaluation of nephrolithiasis. She had her first stone event 6 months ago. She does no know if she passed the calculus. The pain subsided and she was doing well until 3 weeks ago when she developed worsening right flank pain. CT from 6/5 shows a 9mm right renal pelvis calculus which was confirmed with KUB today. She denies any fevers chills. No nausea vomiting.    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   Cancer (HCC)    menigiomas brain   Chest pain in adult 07/31/2017   Family history of brain tumor    Family history of brain tumor 06/27/2021   Gallstones    Headache    Hyperlipidemia with target LDL less than 100 08/18/2015   Hyperparathyroidism (HCC)    Internal hemorrhoids 08/18/2014   Lynch syndrome    PONV (postoperative nausea and vomiting)     Surgical History: Past Surgical History:  Procedure Laterality Date   ABDOMINAL HYSTERECTOMY N/A 03/08/2022   Procedure: HYSTERECTOMY ABDOMINAL;  Surgeon: Keene Pastures, DO;  Location: [facility];  Service: Gynecology;  Laterality: N/A;   ADJACENT TISSUE TRANSFER/TISSUE REARRANGEMENT Right 11/22/2022   Procedure: Fat grafting to right  temple from right buttocks after brain surgery;  Surgeon: Thornell Flirt, DO;  Location: [facility];  Service: Plastics;  Laterality: Right;   APPLICATION OF CRANIAL NAVIGATION Left 02/18/2021   Procedure: APPLICATION OF CRANIAL NAVIGATION;  Surgeon: Cannon Champion, MD;  Location: [facility];  Service: Neurosurgery;  Laterality: Left;   APPLICATION OF CRANIAL NAVIGATION N/A 05/18/2021   Procedure: APPLICATION OF CRANIAL NAVIGATION;  Surgeon: Cannon Champion, MD;   Location: [facility];  Service: Neurosurgery;  Laterality: N/A;   BIOPSY  06/28/2022   Procedure: BIOPSY;  Surgeon: Annis Kinder, DO;  Location: [facility];  Service: Gastroenterology;;   BRAIN SURGERY     CHOLECYSTECTOMY  1993   COLONOSCOPY WITH PROPOFOL  N/A 06/28/2022   Procedure: COLONOSCOPY WITH PROPOFOL ;  Surgeon: Annis Kinder, DO;  Location: [facility];  Service: Gastroenterology;  Laterality: N/A;   CRANIOTOMY Left 02/18/2021   Procedure: Left orbitozygomatic craniotomy for tumor resection  with Brain Lab;  Surgeon: Cannon Champion, MD;  Location: [facility];  Service: Neurosurgery;  Laterality: Left;   CRANIOTOMY Right 05/18/2021   Procedure: Right Orbitozygomatic craniotomy for tumor with brainlab;  Surgeon: Cannon Champion, MD;  Location: [facility];  Service: Neurosurgery;  Laterality: Right;   ESOPHAGOGASTRODUODENOSCOPY (EGD) WITH PROPOFOL  N/A 06/28/2022   Procedure: ESOPHAGOGASTRODUODENOSCOPY (EGD) WITH PROPOFOL ;  Surgeon: Annis Kinder, DO;  Location: [facility];  Service: Gastroenterology;  Laterality: N/A;   PARATHYROIDECTOMY Right 04/23/2023   Procedure: RIGHT INFERIOR PARATHYROIDECTOMY;  Surgeon: Oralee Billow, MD;  Location: [facility];  Service: General;  Laterality: Right;   POLYPECTOMY  06/28/2022   Procedure: POLYPECTOMY;  Surgeon: Annis Kinder, DO;  Location: [facility];  Service: Gastroenterology;;   SALPINGOOPHORECTOMY Bilateral 03/08/2022   Procedure: OPEN SALPINGO OOPHORECTOMY;  Surgeon: Keene Pastures, DO;  Location: [facility];  Service: Gynecology;  Laterality: Bilateral;   SHOULDER ARTHROSCOPY Right 11/06/2019   Procedure: ARTHROSCOPY RIGHT SHOULDER, DEBRIDEMENT X TWO STRUCTURES; OPEN ROTATOR  CUFF REPAIR;  Surgeon: Darrin Emerald, MD;  Location: [facility];  Service: Orthopedics;  Laterality: Right;    Home Medications:  Allergies as of 11/26/2023       Reactions   Gadolinium Derivatives Other (See Comments)   Pt reports she developed chest  tightness, burning of her back, and weakness after MRI contrast 11/2021. Per Dr. Vanda Gemma, he suggests 13 hr protocol prior to MRI.    Iodinated Contrast Media Nausea And Vomiting   Saline Nausea And Vomiting   Sodium Chloride  Nausea And Vomiting        Medication List        Accurate as of November 26, 2023 11:44 AM. If you have any questions, ask your nurse or doctor.          atorvastatin  20 MG tablet Commonly known as: LIPITOR Take 1 tablet (20 mg total) by mouth daily.   colestipol  1 g tablet Commonly known as: Colestid  Take 1 tablet (1 g total) by mouth daily.   DULoxetine  60 MG capsule Commonly known as: Cymbalta  Take 1 capsule (60 mg total) by mouth daily.   gabapentin  600 MG tablet Commonly known as: NEURONTIN  Take 1 tablet (600 mg total) by mouth 3 (three) times daily.   hydrocortisone  1 % ointment Apply 1 Application topically 2 (two) times daily as needed for itching.   naproxen  500 MG tablet Commonly known as: Naprosyn  Take 1 tablet (500 mg total) by mouth 2 (two) times daily with a meal.   ondansetron  4 MG disintegrating tablet Commonly known as: ZOFRAN -ODT Take 1 tablet (4 mg total) by mouth every 8 (eight) hours as needed for nausea.   oxyCODONE -acetaminophen  5-325 MG tablet Commonly known as: PERCOCET/ROXICET Take 1 tablet by mouth every 6 (six) hours as needed for severe pain (pain score 7-10).   oxyCODONE -acetaminophen  5-325 MG tablet Commonly known as: Percocet Take 1 tablet by mouth every 6 (six) hours as needed.   PROBIOTIC DAILY PO Take 1 tablet by mouth daily. Olly   tamsulosin 0.4 MG Caps capsule Commonly known as: FLOMAX Take 1 capsule (0.4 mg total) by mouth daily for 7 days.        Allergies:  Allergies  Allergen Reactions   Gadolinium Derivatives Other (See Comments)    Pt reports she developed chest tightness, burning of her back, and weakness after MRI contrast 11/2021. Per Dr. Vanda Gemma, he suggests 13 hr protocol prior to MRI.     Iodinated Contrast Media Nausea And Vomiting   Saline Nausea And Vomiting   Sodium Chloride  Nausea And Vomiting    Family History: Family History  Problem Relation Age of Onset   Cancer Mother    Hyperlipidemia Mother    Lung cancer Mother    Hypertension Father    Heart disease Father    Other Sister        meningiomas/tumors x11   Other Daughter        Lynch syndrome   Brain cancer Maternal Aunt        d. 45s-60s   Lung cancer Maternal Aunt    Bone cancer Maternal Aunt        d. 50s-60s   Liver cancer Paternal Uncle    Lung cancer Paternal Grandmother        d.85   Rectal cancer Neg Hx    Esophageal cancer Neg Hx    Stomach cancer Neg Hx    Breast cancer Neg Hx    Colon polyps Neg Hx  Colon cancer Neg Hx     Social History:  reports that she has never smoked. She has never used smokeless tobacco. She reports that she does not drink alcohol and does not use drugs.  ROS: All other review of systems were reviewed and are negative except what is noted above in HPI  Physical Exam: BP (!) 106/58   Pulse 61   Constitutional:  Alert and oriented, No acute distress. HEENT: [place] AT, moist mucus membranes.  Trachea midline, no masses. Cardiovascular: No clubbing, cyanosis, or edema. Respiratory: Normal respiratory effort, no increased work of breathing. GI: Abdomen is soft, nontender, nondistended, no abdominal masses GU: No CVA tenderness.  Lymph: No cervical or inguinal lymphadenopathy. Skin: No rashes, bruises or suspicious lesions. Neurologic: Grossly intact, no focal deficits, moving all 4 extremities. Psychiatric: Normal mood and affect.  Laboratory Data: Lab Results  Component Value Date   WBC 10.8 (H) 11/25/2023   HGB 12.5 11/25/2023   HCT 38.1 11/25/2023   MCV 89.0 11/25/2023   PLT 315 11/25/2023    Lab Results  Component Value Date   CREATININE 0.84 11/25/2023    No results found for: "PSA"  No results found for: "TESTOSTERONE"  Lab Results   Component Value Date   HGBA1C 5.3 02/27/2023    Urinalysis    Component Value Date/Time   COLORURINE STRAW (A) 11/25/2023 2146   APPEARANCEUR CLEAR 11/25/2023 2146   APPEARANCEUR Cloudy (A) 08/27/2023 1100   LABSPEC 1.013 11/25/2023 2146   PHURINE 7.0 11/25/2023 2146   GLUCOSEU NEGATIVE 11/25/2023 2146   HGBUR MODERATE (A) 11/25/2023 2146   BILIRUBINUR NEGATIVE 11/25/2023 2146   BILIRUBINUR Negative 08/27/2023 1100   KETONESUR NEGATIVE 11/25/2023 2146   PROTEINUR NEGATIVE 11/25/2023 2146   UROBILINOGEN negative 08/18/2015 1234   NITRITE NEGATIVE 11/25/2023 2146   LEUKOCYTESUR MODERATE (A) 11/25/2023 2146    Lab Results  Component Value Date   LABMICR See below: 08/27/2023   WBCUA >30 (A) 08/27/2023   RBCUA 3-10 (A) 04/11/2018   LABEPIT 0-10 08/27/2023   MUCUS Present (A) 08/27/2023   BACTERIA NONE SEEN 11/25/2023    Pertinent Imaging: CT 6/5 and KUb today: Images reviewed and discussed with the patient  Results for orders placed in visit on 03/04/14  DG Abd 1 View  Narrative CLINICAL DATA:  One week history of lower abdominal pain  EXAM: ABDOMEN - 1 VIEW  COMPARISON:  None.  FINDINGS: There is increased stool burden throughout the colon. There is no evidence of a small or large bowel obstruction. No free extraluminal gas collections are demonstrated. There are is an approximately 6 mm diameter calcification projecting over the mid to upper pole of the right kidney which may reflect a stone. The gallbladder is surgically absent. There are numerous pelvic phleboliths. The bony structures are unremarkable.  IMPRESSION: 1. Increased stool burden throughout the colon suggests constipation. There is no bowel abnormality otherwise. 2. Possible right-sided kidney stone.   Electronically Signed By: David  [place] On: 03/05/2014 08:20  No results found for this or any previous visit.  No results found for this or any previous visit.  No results found for  this or any previous visit.  No results found for this or any previous visit.  No results found for this or any previous visit.  No results found for this or any previous visit.  Results for orders placed during the hospital encounter of 11/22/23  CT RENAL STONE STUDY  Narrative CLINICAL DATA:  Left flank pain,  history of kidney stones  EXAM: CT ABDOMEN AND PELVIS WITHOUT CONTRAST  TECHNIQUE: Multidetector CT imaging of the abdomen and pelvis was performed following the standard protocol without IV contrast.  RADIATION DOSE REDUCTION: This exam was performed according to the departmental dose-optimization program which includes automated exposure control, adjustment of the mA and/or kV according to patient size and/or use of iterative reconstruction technique.  COMPARISON:  Nov 04, 2023  FINDINGS: Lower chest: No acute abnormality.  Hepatobiliary: No focal liver abnormality is seen. Status post cholecystectomy. No biliary dilatation.  Pancreas: Unremarkable. No pancreatic ductal dilatation or surrounding inflammatory changes.  Spleen: Normal in size without focal abnormality.  Adrenals/Urinary Tract: Adrenal glands are normal.  Kidneys are normal size. Comparison with prior examinations demonstrates no significant change in the bilateral nephrolithiasis with a 9 mm stone in the right renal pelvis without hydronephrosis. Several calcifications in the lower pole calices of the left kidney consistent with loosely formed staghorn calculus unchanged since prior examination. No ureteral stones no hydronephrosis no bladder stones  Stomach/Bowel: Stomach is within normal limits. Appendix appears normal. No evidence of bowel wall thickening, distention, or inflammatory changes.  Vascular/Lymphatic: No significant vascular findings are present. No enlarged abdominal or pelvic lymph nodes.  Reproductive: Uterus and bilateral adnexa are unremarkable.  Other: No  abdominal wall hernia or abnormality. No abdominopelvic ascites.  Musculoskeletal: No acute or significant osseous findings.  IMPRESSION: *No acute abnormality of the abdomen or pelvis. *Bilateral nephrolithiasis without hydronephrosis. *Status post cholecystectomy.   Electronically Signed By: Fredrich Jefferson M.D. On: 11/22/2023 12:42   Assessment & Plan:    1. Kidney stones (Primary) -We discussed the management of kidney stones. These options include observation, ureteroscopy, shockwave lithotripsy (ESWL) and percutaneous nephrolithotomy (PCNL). We discussed which options are relevant to the patient's stone(s). We discussed the natural history of kidney stones as well as the complications of untreated stones and the impact on quality of life without treatment as well as with each of the above listed treatments. We also discussed the efficacy of each treatment in its ability to clear the stone burden. With any of these management options I discussed the signs and symptoms of infection and the need for emergent treatment should these be experienced. For each option we discussed the ability of each procedure to clear the patient of their stone burden.   For observation I described the risks which include but are not limited to silent renal damage, life-threatening infection, need for emergent surgery, failure to pass stone and pain.   For ureteroscopy I described the risks which include bleeding, infection, damage to contiguous structures, positioning injury, ureteral stricture, ureteral avulsion, ureteral injury, need for prolonged ureteral stent, inability to perform ureteroscopy, need for an interval procedure, inability to clear stone burden, stent discomfort/pain, heart attack, stroke, pulmonary embolus and the inherent risks with general anesthesia.   For shockwave lithotripsy I described the risks which include arrhythmia, kidney contusion, kidney hemorrhage, need for transfusion, pain,  inability to adequately break up stone, inability to pass stone fragments, Steinstrasse, infection associated with obstructing stones, need for alternate surgical procedure, need for repeat shockwave lithotripsy, MI, CVA, PE and the inherent risks with anesthesia/conscious sedation.   For PCNL I described the risks including positioning injury, pneumothorax, hydrothorax, need for chest tube, inability to clear stone burden, renal laceration, arterial venous fistula or malformation, need for embolization of kidney, loss of kidney or renal function, need for repeat procedure, need for prolonged nephrostomy tube, ureteral avulsion, MI,  CVA, PE and the inherent risks of general anesthesia.   - The patient would like to proceed with right ESWL - Urinalysis, Routine w reflex microscopic   No follow-ups on file.  [name], MD  [facility] [place]

## 2023-11-27 LAB — URINE CULTURE

## 2023-11-28 MED FILL — Oxycodone w/ Acetaminophen Tab 5-325 MG: ORAL | Qty: 6 | Status: AC

## 2023-11-29 ENCOUNTER — Encounter (HOSPITAL_COMMUNITY)
Admission: RE | Admit: 2023-11-29 | Discharge: 2023-11-29 | Disposition: A | Discharge status: Home / Self Care | Source: Ambulatory Visit | Attending: Urology | Admitting: Urology

## 2023-11-29 ENCOUNTER — Other Ambulatory Visit: Payer: Self-pay

## 2023-11-29 ENCOUNTER — Encounter (HOSPITAL_COMMUNITY): Payer: Self-pay

## 2023-11-29 HISTORY — DX: Dentofacial anomaly, unspecified: M26.9

## 2023-12-04 ENCOUNTER — Other Ambulatory Visit: Payer: Self-pay

## 2023-12-04 ENCOUNTER — Encounter (HOSPITAL_COMMUNITY): Payer: Self-pay | Admitting: Urology

## 2023-12-04 ENCOUNTER — Ambulatory Visit (HOSPITAL_COMMUNITY)
Admission: RE | Admit: 2023-12-04 | Discharge: 2023-12-04 | Disposition: A | Discharge status: Home / Self Care | Attending: Urology | Admitting: Urology

## 2023-12-04 ENCOUNTER — Encounter (HOSPITAL_COMMUNITY)
Admission: RE | Disposition: A | Discharge status: Home / Self Care | Payer: Self-pay | Source: Home / Self Care | Attending: Urology

## 2023-12-04 ENCOUNTER — Ambulatory Visit (HOSPITAL_COMMUNITY)

## 2023-12-04 DIAGNOSIS — N200 Calculus of kidney: Secondary | ICD-10-CM

## 2023-12-04 DIAGNOSIS — N202 Calculus of kidney with calculus of ureter: Secondary | ICD-10-CM | POA: Insufficient documentation

## 2023-12-04 HISTORY — PX: EXTRACORPOREAL SHOCK WAVE LITHOTRIPSY: SHX1557

## 2023-12-04 SURGERY — LITHOTRIPSY, ESWL
Anesthesia: LOCAL | Laterality: Right

## 2023-12-04 MED ORDER — OXYCODONE-ACETAMINOPHEN 5-325 MG PO TABS: 1.0000 | ORAL_TABLET | Freq: Four times a day (QID) | ORAL | 0 refills | Status: DC | PRN

## 2023-12-04 MED ORDER — DIAZEPAM 5 MG PO TABS
10.0000 mg | ORAL_TABLET | Freq: Once | ORAL | Status: AC
  Administered 2023-12-04: 10 mg via ORAL
  Filled 2023-12-04: qty 2

## 2023-12-04 MED ORDER — DIPHENHYDRAMINE HCL 25 MG PO CAPS
25.0000 mg | ORAL_CAPSULE | ORAL | Status: AC
  Administered 2023-12-04: 25 mg via ORAL
  Filled 2023-12-04: qty 1

## 2023-12-04 MED ORDER — TAMSULOSIN HCL 0.4 MG PO CAPS: 0.4000 mg | ORAL_CAPSULE | Freq: Every day | ORAL | 0 refills | Status: DC

## 2023-12-04 MED ORDER — ONDANSETRON 4 MG PO TBDP: 4.0000 mg | ORAL_TABLET | Freq: Three times a day (TID) | ORAL | 0 refills | Status: DC | PRN

## 2023-12-04 NOTE — Interval H&P Note (Signed)
 History and Physical Interval Note:  12/04/2023 8:33 AM  Laurie Kelley  has presented today for surgery, with the diagnosis of RIGHT RENAL CALCULUS.  The various methods of treatment have been discussed with the patient and family. After consideration of risks, benefits and other options for treatment, the patient has consented to  Procedure(s): LITHOTRIPSY, ESWL (Right) as a surgical intervention.  The patient's history has been reviewed, patient examined, no change in status, stable for surgery.  I have reviewed the patient's chart and labs.  Questions were answered to the patient's satisfaction.     [name]

## 2023-12-05 ENCOUNTER — Encounter (HOSPITAL_COMMUNITY): Payer: Self-pay | Admitting: Urology

## 2023-12-14 ENCOUNTER — Telehealth: Payer: Self-pay | Admitting: Urology

## 2023-12-14 NOTE — Telephone Encounter (Signed)
 Had litho on right side. She also needs the left side done and she is in extreme pain. She woke up at 3am and cannot relieve the pain.

## 2023-12-17 ENCOUNTER — Other Ambulatory Visit: Payer: Self-pay

## 2023-12-17 NOTE — Telephone Encounter (Signed)
 I spoke with patient, she is aware message was sent to MD for surgery request.  She was advised to go to ER if her pain worsens, she states her pain has improved.  I informed her if MD recommends lithotripsy then she will have to come by the office for a blue litho folder.  Patient voiced understanding

## 2023-12-17 NOTE — Telephone Encounter (Signed)
 Please add to surgery workque.  Will you send in pain meds?

## 2023-12-18 ENCOUNTER — Other Ambulatory Visit: Payer: Self-pay

## 2023-12-18 ENCOUNTER — Other Ambulatory Visit: Payer: Self-pay | Admitting: Urology

## 2023-12-18 DIAGNOSIS — N200 Calculus of kidney: Secondary | ICD-10-CM

## 2023-12-24 ENCOUNTER — Telehealth: Payer: Self-pay | Admitting: Family Medicine

## 2023-12-24 NOTE — Telephone Encounter (Signed)
 Appointment made for Wednesday. LS

## 2023-12-24 NOTE — Telephone Encounter (Unsigned)
 Copied from CRM [id number]. Topic: Appointments - Appointment Info/Confirmation >> Dec 24, 2023  9:13 AM Marda MATSU wrote: Patient Laurie Kelley would like a call back to see if you can fit her in a earlier time slot tomorrow 12/25/23. Her brother is having surgery and she would like to be there  for him but also needs this appt too. Please advise.

## 2023-12-24 NOTE — Telephone Encounter (Signed)
 Totally ok to make virtual or if she prefers, I'm DOD Wednesday.

## 2023-12-25 ENCOUNTER — Ambulatory Visit: Admitting: Family Medicine

## 2023-12-25 NOTE — Progress Notes (Signed)
 Agree with the assessment and plan as outlined by Boone Master, PA-C.  Nimrat Woolworth, DO, [facility]

## 2023-12-26 ENCOUNTER — Ambulatory Visit (INDEPENDENT_AMBULATORY_CARE_PROVIDER_SITE_OTHER): Admitting: Family Medicine

## 2023-12-26 ENCOUNTER — Ambulatory Visit (HOSPITAL_COMMUNITY)
Admission: RE | Admit: 2023-12-26 | Discharge: 2023-12-26 | Disposition: A | Discharge status: Home / Self Care | Source: Ambulatory Visit | Attending: Urology | Admitting: Urology

## 2023-12-26 ENCOUNTER — Encounter: Payer: Self-pay | Admitting: Family Medicine

## 2023-12-26 ENCOUNTER — Ambulatory Visit (INDEPENDENT_AMBULATORY_CARE_PROVIDER_SITE_OTHER): Admitting: Urology

## 2023-12-26 ENCOUNTER — Encounter: Payer: Self-pay | Admitting: Urology

## 2023-12-26 VITALS — BP 113/68 | HR 69 | Temp 97.3°F | Ht 65.0 in | Wt 161.6 lb

## 2023-12-26 VITALS — BP 105/72 | HR 86

## 2023-12-26 DIAGNOSIS — D329 Benign neoplasm of meninges, unspecified: Secondary | ICD-10-CM | POA: Diagnosis not present

## 2023-12-26 DIAGNOSIS — N200 Calculus of kidney: Secondary | ICD-10-CM

## 2023-12-26 DIAGNOSIS — N3000 Acute cystitis without hematuria: Secondary | ICD-10-CM

## 2023-12-26 DIAGNOSIS — M8500 Fibrous dysplasia (monostotic), unspecified site: Secondary | ICD-10-CM | POA: Insufficient documentation

## 2023-12-26 DIAGNOSIS — Q670 Congenital facial asymmetry: Secondary | ICD-10-CM

## 2023-12-26 DIAGNOSIS — R258 Other abnormal involuntary movements: Secondary | ICD-10-CM

## 2023-12-26 LAB — URINALYSIS, ROUTINE W REFLEX MICROSCOPIC
Bilirubin, UA: NEGATIVE
Glucose, UA: NEGATIVE
Ketones, UA: NEGATIVE
Nitrite, UA: POSITIVE — AB
Specific Gravity, UA: 1.02 (ref 1.005–1.030)
Urobilinogen, Ur: 1 mg/dL (ref 0.2–1.0)
pH, UA: 6 (ref 5.0–7.5)

## 2023-12-26 LAB — MICROSCOPIC EXAMINATION: WBC, UA: >30 /HPF — AB (ref 0–5)

## 2023-12-26 MED ORDER — NITROFURANTOIN MONOHYD MACRO 100 MG PO CAPS
100.0000 mg | ORAL_CAPSULE | Freq: Two times a day (BID) | ORAL | 0 refills | Status: DC
Start: 2023-12-26 — End: 2024-02-25

## 2023-12-26 NOTE — Progress Notes (Signed)
 12/26/2023 10:45 AM   Laurie Kelley July 26, 1970 [id number]  Referring provider: [name], DO [address] [place],  [place] [address]  Followup after ESWL  HPI: Laurie Kelley is a 53yo here for followup after rigth ESWL. She passed numerous fragments. KUb from today shows no residual right ureteral calculi. 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   Lynch syndrome    PONV (postoperative nausea and vomiting)     Surgical History: Past Surgical History:  Procedure Laterality Date   ABDOMINAL HYSTERECTOMY N/A 03/08/2022   Procedure: HYSTERECTOMY ABDOMINAL;  Surgeon: Ozan, Jennifer, DO;  Location: [facility];  Service: Gynecology;  Laterality: N/A;   ADJACENT TISSUE TRANSFER/TISSUE REARRANGEMENT Right 11/22/2022   Procedure: Fat grafting to right  temple from right buttocks after brain surgery;  Surgeon: Lowery Estefana RAMAN, DO;  Location: [facility];  Service: Plastics;  Laterality: Right;   APPLICATION OF CRANIAL NAVIGATION Left 02/18/2021   Procedure: APPLICATION OF CRANIAL NAVIGATION;  Surgeon: Cheryle Debby LABOR, MD;  Location: [facility];  Service: Neurosurgery;  Laterality: Left;   APPLICATION OF CRANIAL NAVIGATION N/A 05/18/2021   Procedure: APPLICATION OF CRANIAL NAVIGATION;  Surgeon: Cheryle Debby LABOR, MD;  Location: [facility];  Service: Neurosurgery;  Laterality: N/A;   BIOPSY  06/28/2022   Procedure: BIOPSY;  Surgeon: San Sandor GAILS, DO;  Location: [facility];  Service:  Gastroenterology;;   BRAIN SURGERY     CHOLECYSTECTOMY  1993   COLONOSCOPY WITH PROPOFOL  N/A 06/28/2022   Procedure: COLONOSCOPY WITH PROPOFOL ;  Surgeon: San Sandor GAILS, DO;  Location: [facility];  Service: Gastroenterology;  Laterality: N/A;   CRANIOTOMY Left 02/18/2021   Procedure: Left orbitozygomatic craniotomy for tumor resection  with Brain Lab;  Surgeon: Cheryle Debby LABOR, MD;  Location: [facility] OR;  Service: Neurosurgery;  Laterality: Left;   CRANIOTOMY Right 05/18/2021   Procedure: Right Orbitozygomatic craniotomy for tumor with brainlab;  Surgeon: Cheryle Debby LABOR, MD;  Location: [facility];  Service: Neurosurgery;  Laterality: Right;   ESOPHAGOGASTRODUODENOSCOPY (EGD) WITH PROPOFOL  N/A 06/28/2022   Procedure: ESOPHAGOGASTRODUODENOSCOPY (EGD) WITH PROPOFOL ;  Surgeon: San Sandor GAILS, DO;  [facility];  Service: Gastroenterology;  Laterality: N/A;   EXTRACORPOREAL SHOCK WAVE LITHOTRIPSY Right 12/04/2023   Procedure: LITHOTRIPSY, ESWL;  Surgeon: Sherrilee Belvie LITTIE, MD;  Location: [facility];  Service: Urology;  Laterality: Right;   PARATHYROIDECTOMY Right 04/23/2023   Procedure: RIGHT INFERIOR PARATHYROIDECTOMY;  Surgeon: Eletha Boas, MD;  Location: [facility];  Service: General;  Laterality: Right;   POLYPECTOMY  06/28/2022   Procedure: POLYPECTOMY;  Surgeon: San Sandor GAILS, DO;  Location: [facility];  Service: Gastroenterology;;   SALPINGOOPHORECTOMY Bilateral 03/08/2022   Procedure: OPEN SALPINGO OOPHORECTOMY;  Surgeon: Marilynn Nest, DO;  Location: [facility];  Service: Gynecology;  Laterality: Bilateral;   SHOULDER ARTHROSCOPY Right 11/06/2019   Procedure: ARTHROSCOPY RIGHT SHOULDER, DEBRIDEMENT X TWO STRUCTURES; OPEN ROTATOR  CUFF REPAIR;  Surgeon: Margrette Taft BRAVO, MD;  Location: [facility];  Service: Orthopedics;  Laterality: Right;    Home Medications:  Allergies as of 12/26/2023       Reactions   Gadolinium Derivatives Other (See Comments)   Pt reports she developed  chest tightness, burning of her back, and weakness after MRI contrast 11/2021. Per Dr. Shoshana, he suggests 13 hr protocol prior to MRI.    Iodinated Contrast Media Nausea And Vomiting   Saline Nausea And Vomiting   Sodium Chloride  Nausea And Vomiting        Medication List        Accurate as of December 26, 2023 10:45 AM. If you have any questions, ask your nurse or doctor.          atorvastatin  20 MG tablet Commonly known as: LIPITOR Take 1 tablet (20 mg total) by mouth daily.   colestipol  1 g tablet Commonly known as: Colestid  Take 1 tablet (1 g total) by mouth daily.   DULoxetine  60 MG capsule Commonly known as: Cymbalta  Take 1 capsule (60 mg total) by mouth daily.   gabapentin  600 MG tablet Commonly known as: NEURONTIN  Take 1 tablet (600 mg total) by mouth 3 (three) times daily.   hydrocortisone  1 % ointment Apply 1 Application topically 2 (two) times daily as needed for itching.   naproxen  500 MG tablet Commonly known as: Naprosyn  Take 1 tablet (500 mg total) by mouth 2 (two) times daily with a meal.   ondansetron  4 MG disintegrating tablet Commonly known as: ZOFRAN -ODT Take 1 tablet (4 mg total) by mouth every 8 (eight) hours as needed for nausea.   ondansetron  4 MG tablet Commonly known as: Zofran  Take 1 tablet (4 mg total) by mouth every 8 (eight) hours as needed for nausea or vomiting.   oxyCODONE -acetaminophen  5-325 MG tablet Commonly known as: PERCOCET/ROXICET Take 1 tablet by mouth every 6 (six) hours as needed for severe pain (pain score 7-10).   oxyCODONE -acetaminophen  5-325 MG tablet Commonly known as: Percocet Take 1 tablet by mouth every 6 (six) hours as needed.   PROBIOTIC DAILY PO Take 1 tablet by mouth daily. Olly   tamsulosin  0.4 MG Caps capsule Commonly known as: FLOMAX  Take 1 capsule (0.4 mg total) by mouth daily.        Allergies:  Allergies  Allergen Reactions   Gadolinium Derivatives Other (See Comments)    Pt reports she  developed chest tightness, burning of her back, and weakness after MRI contrast 11/2021. Per Dr. Shoshana, he suggests 13 hr protocol prior to MRI.    Iodinated Contrast Media Nausea And Vomiting   Saline Nausea And Vomiting   Sodium Chloride  Nausea And Vomiting    Family History: Family History  Problem Relation Age of Onset   Cancer Mother    Hyperlipidemia Mother    Lung cancer Mother    Hypertension Father    Heart disease Father    Other Sister        meningiomas/tumors x11   Other Daughter        Lynch syndrome   Brain cancer Maternal Aunt        d. 65s-60s   Lung cancer Maternal Aunt    Bone cancer Maternal Aunt        d. 50s-60s   Liver cancer Paternal Uncle    Lung cancer Paternal Grandmother        d.85   Rectal cancer Neg Hx    Esophageal cancer  Neg Hx    Stomach cancer Neg Hx    Breast cancer Neg Hx    Colon polyps Neg Hx    Colon cancer Neg Hx     Social History:  reports that she has never smoked. She has never used smokeless tobacco. She reports that she does not drink alcohol and does not use drugs.  ROS: All other review of systems were reviewed and are negative except what is noted above in HPI  Physical Exam: BP 105/72   Pulse 86   Constitutional:  Alert and oriented, No acute distress. HEENT: [place] AT, moist mucus membranes.  Trachea midline, no masses. Cardiovascular: No clubbing, cyanosis, or edema. Respiratory: Normal respiratory effort, no increased work of breathing. GI: Abdomen is soft, nontender, nondistended, no abdominal masses GU: No CVA tenderness.  Lymph: No cervical or inguinal lymphadenopathy. Skin: No rashes, bruises or suspicious lesions. Neurologic: Grossly intact, no focal deficits, moving all 4 extremities. Psychiatric: Normal mood and affect.  Laboratory Data: Lab Results  Component Value Date   WBC 10.8 (H) 11/25/2023   HGB 12.5 11/25/2023   HCT 38.1 11/25/2023   MCV 89.0 11/25/2023   PLT 315 11/25/2023    Lab Results   Component Value Date   CREATININE 0.84 11/25/2023    No results found for: PSA  No results found for: TESTOSTERONE  Lab Results  Component Value Date   HGBA1C 5.3 02/27/2023    Urinalysis    Component Value Date/Time   COLORURINE STRAW (A) 11/25/2023 2146   APPEARANCEUR CLEAR 11/25/2023 2146   APPEARANCEUR Cloudy (A) 08/27/2023 1100   LABSPEC 1.013 11/25/2023 2146   PHURINE 7.0 11/25/2023 2146   GLUCOSEU NEGATIVE 11/25/2023 2146   HGBUR MODERATE (A) 11/25/2023 2146   BILIRUBINUR NEGATIVE 11/25/2023 2146   BILIRUBINUR Negative 08/27/2023 1100   KETONESUR NEGATIVE 11/25/2023 2146   PROTEINUR NEGATIVE 11/25/2023 2146   UROBILINOGEN negative 08/18/2015 1234   NITRITE NEGATIVE 11/25/2023 2146   LEUKOCYTESUR MODERATE (A) 11/25/2023 2146    Lab Results  Component Value Date   LABMICR See below: 08/27/2023   WBCUA >30 (A) 08/27/2023   RBCUA 3-10 (A) 04/11/2018   LABEPIT 0-10 08/27/2023   MUCUS Present (A) 08/27/2023   BACTERIA NONE SEEN 11/25/2023    Pertinent Imaging: KUB today: Images reviewed and discussed with the patient Results for orders placed during the hospital encounter of 12/04/23  DG Abd 1 View  Narrative CLINICAL DATA:  Nephrolithiasis.  EXAM: ABDOMEN - 1 VIEW  COMPARISON:  11/26/2023.  FINDINGS: The bowel gas pattern is normal. Left-sided nephrolithiasis with two 1 cm stones. Right mid ureter 1.5 cm stone.  IMPRESSION: Left-sided nephrolithiasis.  Right mid ureteral stone.   Electronically Signed By: Fonda Field M.D. On: 12/04/2023 12:14  No results found for this or any previous visit.  No results found for this or any previous visit.  No results found for this or any previous visit.  No results found for this or any previous visit.  No results found for this or any previous visit.  No results found for this or any previous visit.  Results for orders placed during the hospital encounter of 11/22/23  CT RENAL STONE  STUDY  Narrative CLINICAL DATA:  Left flank pain, history of kidney stones  EXAM: CT ABDOMEN AND PELVIS WITHOUT CONTRAST  TECHNIQUE: Multidetector CT imaging of the abdomen and pelvis was performed following the standard protocol without IV contrast.  RADIATION DOSE REDUCTION: This exam was performed according to the  departmental dose-optimization program which includes automated exposure control, adjustment of the mA and/or kV according to patient size and/or use of iterative reconstruction technique.  COMPARISON:  Nov 04, 2023  FINDINGS: Lower chest: No acute abnormality.  Hepatobiliary: No focal liver abnormality is seen. Status post cholecystectomy. No biliary dilatation.  Pancreas: Unremarkable. No pancreatic ductal dilatation or surrounding inflammatory changes.  Spleen: Normal in size without focal abnormality.  Adrenals/Urinary Tract: Adrenal glands are normal.  Kidneys are normal size. Comparison with prior examinations demonstrates no significant change in the bilateral nephrolithiasis with a 9 mm stone in the right renal pelvis without hydronephrosis. Several calcifications in the lower pole calices of the left kidney consistent with loosely formed staghorn calculus unchanged since prior examination. No ureteral stones no hydronephrosis no bladder stones  Stomach/Bowel: Stomach is within normal limits. Appendix appears normal. No evidence of bowel wall thickening, distention, or inflammatory changes.  Vascular/Lymphatic: No significant vascular findings are present. No enlarged abdominal or pelvic lymph nodes.  Reproductive: Uterus and bilateral adnexa are unremarkable.  Other: No abdominal wall hernia or abnormality. No abdominopelvic ascites.  Musculoskeletal: No acute or significant osseous findings.  IMPRESSION: *No acute abnormality of the abdomen or pelvis. *Bilateral nephrolithiasis without hydronephrosis. *Status post  cholecystectomy.   Electronically Signed By: Franky Chard M.D. On: 11/22/2023 12:42   Assessment & Plan:    1. Kidney stones (Primary) -We discussed the management of kidney stones. These options include observation, ureteroscopy, shockwave lithotripsy (ESWL) and percutaneous nephrolithotomy (PCNL). We discussed which options are relevant to the patient's stone(s). We discussed the natural history of kidney stones as well as the complications of untreated stones and the impact on quality of life without treatment as well as with each of the above listed treatments. We also discussed the efficacy of each treatment in its ability to clear the stone burden. With any of these management options I discussed the signs and symptoms of infection and the need for emergent treatment should these be experienced. For each option we discussed the ability of each procedure to clear the patient of their stone burden.   For observation I described the risks which include but are not limited to silent renal damage, life-threatening infection, need for emergent surgery, failure to pass stone and pain.   For ureteroscopy I described the risks which include bleeding, infection, damage to contiguous structures, positioning injury, ureteral stricture, ureteral avulsion, ureteral injury, need for prolonged ureteral stent, inability to perform ureteroscopy, need for an interval procedure, inability to clear stone burden, stent discomfort/pain, heart attack, stroke, pulmonary embolus and the inherent risks with general anesthesia.   For shockwave lithotripsy I described the risks which include arrhythmia, kidney contusion, kidney hemorrhage, need for transfusion, pain, inability to adequately break up stone, inability to pass stone fragments, Steinstrasse, infection associated with obstructing stones, need for alternate surgical procedure, need for repeat shockwave lithotripsy, MI, CVA, PE and the inherent risks with  anesthesia/conscious sedation.   For PCNL I described the risks including positioning injury, pneumothorax, hydrothorax, need for chest tube, inability to clear stone burden, renal laceration, arterial venous fistula or malformation, need for embolization of kidney, loss of kidney or renal function, need for repeat procedure, need for prolonged nephrostomy tube, ureteral avulsion, MI, CVA, PE and the inherent risks of general anesthesia.   - The patient would like to proceed with left ESWL - Urinalysis, Routine w reflex microscopic   No follow-ups on file.  [name], MD  [facility] [place]

## 2023-12-26 NOTE — Patient Instructions (Signed)

## 2023-12-26 NOTE — Progress Notes (Signed)
 Subjective: CC: Follow-up meningioma PCP: [name], DO YEP:Laurie Kelley is a 53 y.o. female presenting to clinic today for:  1.  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  She has history of some resection of meningioma and has some right sided facial asymmetry.  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  She notes that she did see the specialist at [facility] and the neurosurgeon there wanted her to also coordinate care with oral surgeon.  They did not agree on her surgical treatment plan and she reports they basically ghosted her.  She would like to have another consultation to determine if there is anything that she can do.  Right now gabapentin  is working to help with neuropathic changes after surgery on that right side.  She does report that she discontinued Cymbalta  very briefly because she was having some jerking movements that occurred a couple of months after starting it and she was not sure if it was related to medication.  The jerking movements occurred for about a week but resolved and have not recurred.  She has not reached out to her neurologist about that just yet but does note that her mood is better back on the Cymbalta   2.  Nephrolithiasis Since her last visit she has had kidney stones treated on the left side and will be scheduled to get them done again on the right side.  She reports no hematuria, nausea or vomiting today  ROS: Per HPI  Allergies  Allergen Reactions   Gadolinium Derivatives Other (See Comments)    Pt reports she developed chest tightness, burning of her back, and weakness after MRI contrast 11/2021. Per Dr. Shoshana, he suggests 13 hr protocol prior to MRI.    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   Lynch syndrome    PONV (postoperative nausea and vomiting)     Current Outpatient Medications:    atorvastatin  (LIPITOR) 20 MG tablet, Take 1 tablet (20 mg total) by mouth daily., Disp: 90 tablet, Rfl: 3   colestipol  (COLESTID ) 1 g tablet, Take 1 tablet (1 g total) by mouth daily., Disp: 30 tablet, Rfl: 3   DULoxetine  (CYMBALTA ) 60 MG capsule, Take 1 capsule (60 mg total) by mouth daily., Disp: 90 capsule, Rfl: 3   gabapentin  (NEURONTIN ) 600 MG tablet, Take 1 tablet (600 mg total) by mouth 3 (three) times daily., Disp: 270 tablet, Rfl: 4   hydrocortisone  1 % ointment, Apply 1 Application topically 2 (two) times daily as needed for itching., Disp: , Rfl:    nitrofurantoin , macrocrystal-monohydrate, (MACROBID ) 100 MG capsule, Take 1 capsule (100 mg total) by mouth  every 12 (twelve) hours., Disp: 14 capsule, Rfl: 0   ondansetron  (ZOFRAN -ODT) 4 MG disintegrating tablet, Take 1 tablet (4 mg total) by mouth every 8 (eight) hours as needed for nausea., Disp: 30 tablet, Rfl: 0   Probiotic Product (PROBIOTIC DAILY PO), Take 1 tablet by mouth daily. Olly, Disp: , Rfl:    tamsulosin  (FLOMAX ) 0.4 MG CAPS capsule, Take 1 capsule (0.4 mg total) by mouth daily., Disp: 30 capsule, Rfl: 0   oxyCODONE -acetaminophen  (PERCOCET) 5-325 MG tablet, Take 1 tablet by mouth every 6 (six) hours as needed. (Patient not taking: Reported on 12/26/2023), Disp: 30 tablet, Rfl: 0   oxyCODONE -acetaminophen   (PERCOCET/ROXICET) 5-325 MG tablet, Take 1 tablet by mouth every 6 (six) hours as needed for severe pain (pain score 7-10). (Patient not taking: Reported on 12/26/2023), Disp: 30 tablet, Rfl: 0 Social History   Socioeconomic History   Marital status: Divorced    Spouse name: Not on file   Number of children: 2   Years of education: Not on file   Highest education level: Associate degree: occupational, Scientist, product/process development, or vocational program  Occupational History   Not on file  Tobacco Use   Smoking status: Never   Smokeless tobacco: Never  Vaping Use   Vaping status: Never Used  Substance and Sexual Activity   Alcohol use: No   Drug use: Never   Sexual activity: Yes    Birth control/protection: Surgical    Comment: hysterectomy  Other Topics Concern   Not on file  Social History Narrative   Not on file   Social Drivers of Health   Financial Resource Strain: Low Risk  (12/22/2023)   Overall Financial Resource Strain (CARDIA)    Difficulty of Paying Living Expenses: Not hard at all  Food Insecurity: No Food Insecurity (12/22/2023)   Hunger Vital Sign    Worried About Running Out of Food in the Last Year: Never true    Ran Out of Food in the Last Year: Never true  Transportation Needs: No Transportation Needs (12/22/2023)   PRAPARE - Administrator, Civil Service (Medical): No    Lack of Transportation (Non-Medical): No  Physical Activity: Inactive (12/22/2023)   Exercise Vital Sign    Days of Exercise per Week: 0 days    Minutes of Exercise per Session: Not on file  Stress: No Stress Concern Present (12/22/2023)   [employer] of Occupational Health - Occupational Stress Questionnaire    Feeling of Stress: Only a little  Social Connections: Socially Isolated (12/22/2023)   Social Connection and Isolation Panel    Frequency of Communication with Friends and Family: More than three times a week    Frequency of Social Gatherings with Friends and Family: Once a week    Attends  Religious Services: Never    Database administrator or Organizations: No    Attends Engineer, structural: Not on file    Marital Status: Divorced  Intimate Partner Violence: Not At Risk (09/09/2023)   Received from [facility]   HITS    Over the last 12 months how often did your partner physically hurt you?: Never    Over the last 12 months how often did your partner insult you or talk down to you?: Never    Over the last 12 months how often did your partner threaten you with physical harm?: Never    Over the last 12 months how often did your partner scream or curse at you?: Never  Family History  Problem Relation Age of Onset   Cancer Mother    Hyperlipidemia Mother    Lung cancer Mother    Hypertension Father    Heart disease Father    Other Sister        meningiomas/tumors x11   Other Daughter        Lynch syndrome   Brain cancer Maternal Aunt        d. 51s-60s   Lung cancer Maternal Aunt    Bone cancer Maternal Aunt        d. 50s-60s   Liver cancer Paternal Uncle    Lung cancer Paternal Grandmother        d.85   Rectal cancer Neg Hx    Esophageal cancer Neg Hx    Stomach cancer Neg Hx    Breast cancer Neg Hx    Colon polyps Neg Hx    Colon cancer Neg Hx     Objective: Office vital signs reviewed. BP 113/68   Pulse 69   Temp (!) 97.3 F (36.3 C) (Temporal)   Ht 5' 5 (1.651 m)   Wt 161 lb 9.6 oz (73.3 kg)   SpO2 96%   BMI 26.89 kg/m   Physical Examination:  General: Awake, alert, well nourished, No acute distress HEENT: Facial asymmetry present with predominance of right cheekbone.  She has some muscle wasting along the right temporal aspect of the head Psych: Mood stable, speech normal, affect appropriate     08/27/2023   10:59 AM 07/06/2023    2:18 PM 03/29/2023   10:10 AM  Depression screen PHQ 2/9  Decreased Interest 1 2 0  Down, Depressed, Hopeless  0   PHQ - 2 Score 1 2 0  Altered sleeping  0 0  Tired, decreased energy  2 2  Change  in appetite  2 0  Feeling bad or failure about yourself   1 0  Trouble concentrating  1 1  Moving slowly or fidgety/restless  1 0  Suicidal thoughts  0 0  PHQ-9 Score  9 3  Difficult doing work/chores  Somewhat difficult Extremely dIfficult      08/27/2023   10:59 AM 07/06/2023    2:21 PM 07/06/2023    2:12 PM 03/29/2023   10:10 AM  GAD 7 : Generalized Anxiety Score  Nervous, Anxious, on Edge 1 1 0 1  Control/stop worrying 1 1 0 1  Worry too much - different things 1 1 0 1  Trouble relaxing 1 1 0 0  Restless 1 1 0 0  Easily annoyed or irritable 0 0 0 2  Afraid - awful might happen 1 2 0 3  Total GAD 7 Score 6 7 0 8  Anxiety Difficulty Very difficult Very difficult  Extremely difficult   \Assessment/ Plan: 53 y.o. 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  She did not feel comfortable going back to [facility] given recent events.  With regards to involuntary jerking movements, she has not had recurrence and we discussed that it is quite possible that medication could induce something like this but would find it unusual  to occur this far out from initiation of the med.  We discussed that if she has recurrence I would like her to see neurology to rule out any complications intracranially and she was amenable to this.  She will keep follow-up with Dr. Sherrilee for treatment of right-sided kidney stones next week   Norene CHRISTELLA Fielding, DO [facility] [phone]

## 2023-12-26 NOTE — H&P (View-Only) (Signed)
 12/26/2023 10:45 AM   Domini LITTIE Slicker [birth year] [id number]  Referring provider: [name], DO [address] [place],  [place] [address]  Followup after ESWL  HPI: Ms Gulledge is a 53yo here for followup after rigth ESWL. She passed numerous fragments. KUb from today shows no residual right ureteral calculi. 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   Lynch syndrome    PONV (postoperative nausea and vomiting)     Surgical History: Past Surgical History:  Procedure Laterality Date   ABDOMINAL HYSTERECTOMY N/A 03/08/2022   Procedure: HYSTERECTOMY ABDOMINAL;  Surgeon: Ozan, Jennifer, DO;  Location: [facility];  Service: Gynecology;  Laterality: N/A;   ADJACENT TISSUE TRANSFER/TISSUE REARRANGEMENT Right 11/22/2022   Procedure: Fat grafting to right  temple from right buttocks after brain surgery;  Surgeon: Lowery Estefana RAMAN, DO;  Location: [facility];  Service: Plastics;  Laterality: Right;   APPLICATION OF CRANIAL NAVIGATION Left 02/18/2021   Procedure: APPLICATION OF CRANIAL NAVIGATION;  Surgeon: Cheryle Debby LABOR, MD;  Location: [facility];  Service: Neurosurgery;  Laterality: Left;   APPLICATION OF CRANIAL NAVIGATION N/A 05/18/2021   Procedure: APPLICATION OF CRANIAL NAVIGATION;  Surgeon: Cheryle Debby LABOR, MD;  Location: [facility];  Service: Neurosurgery;  Laterality: N/A;   BIOPSY  06/28/2022   Procedure: BIOPSY;  Surgeon: San Sandor GAILS, DO;  Location: [facility];  Service:  Gastroenterology;;   BRAIN SURGERY     CHOLECYSTECTOMY  1993   COLONOSCOPY WITH PROPOFOL  N/A 06/28/2022   Procedure: COLONOSCOPY WITH PROPOFOL ;  Surgeon: San Sandor GAILS, DO;  Location: [facility];  Service: Gastroenterology;  Laterality: N/A;   CRANIOTOMY Left 02/18/2021   Procedure: Left orbitozygomatic craniotomy for tumor resection  with Brain Lab;  Surgeon: Cheryle Debby LABOR, MD;  Location: [facility];  Service: Neurosurgery;  Laterality: Left;   CRANIOTOMY Right 05/18/2021   Procedure: Right Orbitozygomatic craniotomy for tumor with brainlab;  Surgeon: Cheryle Debby LABOR, MD;  Location: [facility];  Service: Neurosurgery;  Laterality: Right;   ESOPHAGOGASTRODUODENOSCOPY (EGD) WITH PROPOFOL  N/A 06/28/2022   Procedure: ESOPHAGOGASTRODUODENOSCOPY (EGD) WITH PROPOFOL ;  Surgeon: San Sandor GAILS, DO;  Location: [facility];  Service: Gastroenterology;  Laterality: N/A;   EXTRACORPOREAL SHOCK WAVE LITHOTRIPSY Right 12/04/2023   Procedure: LITHOTRIPSY, ESWL;  Surgeon: Sherrilee Belvie LITTIE, MD;  Location: [facility];  Service: Urology;  Laterality: Right;   PARATHYROIDECTOMY Right 04/23/2023   Procedure: RIGHT INFERIOR PARATHYROIDECTOMY;  Surgeon: Eletha Boas, MD;  Location: [facility];  Service: General;  Laterality: Right;   POLYPECTOMY  06/28/2022   Procedure: POLYPECTOMY;  Surgeon: San Sandor GAILS, DO;  Location: [facility];  Service: Gastroenterology;;   SALPINGOOPHORECTOMY Bilateral 03/08/2022   Procedure: OPEN SALPINGO OOPHORECTOMY;  Surgeon: Marilynn Nest, DO;  Location: [facility];  Service: Gynecology;  Laterality: Bilateral;   SHOULDER ARTHROSCOPY Right 11/06/2019   Procedure: ARTHROSCOPY RIGHT SHOULDER, DEBRIDEMENT X TWO STRUCTURES; OPEN ROTATOR  CUFF REPAIR;  Surgeon: Margrette Taft BRAVO, MD;  Location: [facility];  Service: Orthopedics;  Laterality: Right;    Home Medications:  Allergies as of 12/26/2023       Reactions   Gadolinium Derivatives Other (See Comments)   Pt reports she developed  chest tightness, burning of her back, and weakness after MRI contrast 11/2021. Per Dr. Shoshana, he suggests 13 hr protocol prior to MRI.    Iodinated Contrast Media Nausea And Vomiting   Saline Nausea And Vomiting   Sodium Chloride  Nausea And Vomiting        Medication List        Accurate as of December 26, 2023 10:45 AM. If you have any questions, ask your nurse or doctor.          atorvastatin  20 MG tablet Commonly known as: LIPITOR Take 1 tablet (20 mg total) by mouth daily.   colestipol  1 g tablet Commonly known as: Colestid  Take 1 tablet (1 g total) by mouth daily.   DULoxetine  60 MG capsule Commonly known as: Cymbalta  Take 1 capsule (60 mg total) by mouth daily.   gabapentin  600 MG tablet Commonly known as: NEURONTIN  Take 1 tablet (600 mg total) by mouth 3 (three) times daily.   hydrocortisone  1 % ointment Apply 1 Application topically 2 (two) times daily as needed for itching.   naproxen  500 MG tablet Commonly known as: Naprosyn  Take 1 tablet (500 mg total) by mouth 2 (two) times daily with a meal.   ondansetron  4 MG disintegrating tablet Commonly known as: ZOFRAN -ODT Take 1 tablet (4 mg total) by mouth every 8 (eight) hours as needed for nausea.   ondansetron  4 MG tablet Commonly known as: Zofran  Take 1 tablet (4 mg total) by mouth every 8 (eight) hours as needed for nausea or vomiting.   oxyCODONE -acetaminophen  5-325 MG tablet Commonly known as: PERCOCET/ROXICET Take 1 tablet by mouth every 6 (six) hours as needed for severe pain (pain score 7-10).   oxyCODONE -acetaminophen  5-325 MG tablet Commonly known as: Percocet Take 1 tablet by mouth every 6 (six) hours as needed.   PROBIOTIC DAILY PO Take 1 tablet by mouth daily. Olly   tamsulosin  0.4 MG Caps capsule Commonly known as: FLOMAX  Take 1 capsule (0.4 mg total) by mouth daily.        Allergies:  Allergies  Allergen Reactions   Gadolinium Derivatives Other (See Comments)    Pt reports she  developed chest tightness, burning of her back, and weakness after MRI contrast 11/2021. Per Dr. Shoshana, he suggests 13 hr protocol prior to MRI.    Iodinated Contrast Media Nausea And Vomiting   Saline Nausea And Vomiting   Sodium Chloride  Nausea And Vomiting    Family History: Family History  Problem Relation Age of Onset   Cancer Mother    Hyperlipidemia Mother    Lung cancer Mother    Hypertension Father    Heart disease Father    Other Sister        meningiomas/tumors x11   Other Daughter        Lynch syndrome   Brain cancer Maternal Aunt        d. 42s-60s   Lung cancer Maternal Aunt    Bone cancer Maternal Aunt        d. 50s-60s   Liver cancer Paternal Uncle    Lung cancer Paternal Grandmother        d.85   Rectal cancer Neg Hx    Esophageal cancer  Neg Hx    Stomach cancer Neg Hx    Breast cancer Neg Hx    Colon polyps Neg Hx    Colon cancer Neg Hx     Social History:  reports that she has never smoked. She has never used smokeless tobacco. She reports that she does not drink alcohol and does not use drugs.  ROS: All other review of systems were reviewed and are negative except what is noted above in HPI  Physical Exam: BP 105/72   Pulse 86   Constitutional:  Alert and oriented, No acute distress. HEENT: [place] AT, moist mucus membranes.  Trachea midline, no masses. Cardiovascular: No clubbing, cyanosis, or edema. Respiratory: Normal respiratory effort, no increased work of breathing. GI: Abdomen is soft, nontender, nondistended, no abdominal masses GU: No CVA tenderness.  Lymph: No cervical or inguinal lymphadenopathy. Skin: No rashes, bruises or suspicious lesions. Neurologic: Grossly intact, no focal deficits, moving all 4 extremities. Psychiatric: Normal mood and affect.  Laboratory Data: Lab Results  Component Value Date   WBC 10.8 (H) 11/25/2023   HGB 12.5 11/25/2023   HCT 38.1 11/25/2023   MCV 89.0 11/25/2023   PLT 315 11/25/2023    Lab Results   Component Value Date   CREATININE 0.84 11/25/2023    No results found for: PSA  No results found for: TESTOSTERONE  Lab Results  Component Value Date   HGBA1C 5.3 02/27/2023    Urinalysis    Component Value Date/Time   COLORURINE STRAW (A) 11/25/2023 2146   APPEARANCEUR CLEAR 11/25/2023 2146   APPEARANCEUR Cloudy (A) 08/27/2023 1100   LABSPEC 1.013 11/25/2023 2146   PHURINE 7.0 11/25/2023 2146   GLUCOSEU NEGATIVE 11/25/2023 2146   HGBUR MODERATE (A) 11/25/2023 2146   BILIRUBINUR NEGATIVE 11/25/2023 2146   BILIRUBINUR Negative 08/27/2023 1100   KETONESUR NEGATIVE 11/25/2023 2146   PROTEINUR NEGATIVE 11/25/2023 2146   UROBILINOGEN negative 08/18/2015 1234   NITRITE NEGATIVE 11/25/2023 2146   LEUKOCYTESUR MODERATE (A) 11/25/2023 2146    Lab Results  Component Value Date   LABMICR See below: 08/27/2023   WBCUA >30 (A) 08/27/2023   RBCUA 3-10 (A) 04/11/2018   LABEPIT 0-10 08/27/2023   MUCUS Present (A) 08/27/2023   BACTERIA NONE SEEN 11/25/2023    Pertinent Imaging: KUB today: Images reviewed and discussed with the patient Results for orders placed during the hospital encounter of 12/04/23  DG Abd 1 View  Narrative CLINICAL DATA:  Nephrolithiasis.  EXAM: ABDOMEN - 1 VIEW  COMPARISON:  11/26/2023.  FINDINGS: The bowel gas pattern is normal. Left-sided nephrolithiasis with two 1 cm stones. Right mid ureter 1.5 cm stone.  IMPRESSION: Left-sided nephrolithiasis.  Right mid ureteral stone.   Electronically Signed By: Fonda Field M.D. On: 12/04/2023 12:14  No results found for this or any previous visit.  No results found for this or any previous visit.  No results found for this or any previous visit.  No results found for this or any previous visit.  No results found for this or any previous visit.  No results found for this or any previous visit.  Results for orders placed during the hospital encounter of 11/22/23  CT RENAL STONE  STUDY  Narrative CLINICAL DATA:  Left flank pain, history of kidney stones  EXAM: CT ABDOMEN AND PELVIS WITHOUT CONTRAST  TECHNIQUE: Multidetector CT imaging of the abdomen and pelvis was performed following the standard protocol without IV contrast.  RADIATION DOSE REDUCTION: This exam was performed according to the  departmental dose-optimization program which includes automated exposure control, adjustment of the mA and/or kV according to patient size and/or use of iterative reconstruction technique.  COMPARISON:  Nov 04, 2023  FINDINGS: Lower chest: No acute abnormality.  Hepatobiliary: No focal liver abnormality is seen. Status post cholecystectomy. No biliary dilatation.  Pancreas: Unremarkable. No pancreatic ductal dilatation or surrounding inflammatory changes.  Spleen: Normal in size without focal abnormality.  Adrenals/Urinary Tract: Adrenal glands are normal.  Kidneys are normal size. Comparison with prior examinations demonstrates no significant change in the bilateral nephrolithiasis with a 9 mm stone in the right renal pelvis without hydronephrosis. Several calcifications in the lower pole calices of the left kidney consistent with loosely formed staghorn calculus unchanged since prior examination. No ureteral stones no hydronephrosis no bladder stones  Stomach/Bowel: Stomach is within normal limits. Appendix appears normal. No evidence of bowel wall thickening, distention, or inflammatory changes.  Vascular/Lymphatic: No significant vascular findings are present. No enlarged abdominal or pelvic lymph nodes.  Reproductive: Uterus and bilateral adnexa are unremarkable.  Other: No abdominal wall hernia or abnormality. No abdominopelvic ascites.  Musculoskeletal: No acute or significant osseous findings.  IMPRESSION: *No acute abnormality of the abdomen or pelvis. *Bilateral nephrolithiasis without hydronephrosis. *Status post  cholecystectomy.   Electronically Signed By: Franky Chard M.D. On: 11/22/2023 12:42   Assessment & Plan:    1. Kidney stones (Primary) -We discussed the management of kidney stones. These options include observation, ureteroscopy, shockwave lithotripsy (ESWL) and percutaneous nephrolithotomy (PCNL). We discussed which options are relevant to the patient's stone(s). We discussed the natural history of kidney stones as well as the complications of untreated stones and the impact on quality of life without treatment as well as with each of the above listed treatments. We also discussed the efficacy of each treatment in its ability to clear the stone burden. With any of these management options I discussed the signs and symptoms of infection and the need for emergent treatment should these be experienced. For each option we discussed the ability of each procedure to clear the patient of their stone burden.   For observation I described the risks which include but are not limited to silent renal damage, life-threatening infection, need for emergent surgery, failure to pass stone and pain.   For ureteroscopy I described the risks which include bleeding, infection, damage to contiguous structures, positioning injury, ureteral stricture, ureteral avulsion, ureteral injury, need for prolonged ureteral stent, inability to perform ureteroscopy, need for an interval procedure, inability to clear stone burden, stent discomfort/pain, heart attack, stroke, pulmonary embolus and the inherent risks with general anesthesia.   For shockwave lithotripsy I described the risks which include arrhythmia, kidney contusion, kidney hemorrhage, need for transfusion, pain, inability to adequately break up stone, inability to pass stone fragments, Steinstrasse, infection associated with obstructing stones, need for alternate surgical procedure, need for repeat shockwave lithotripsy, MI, CVA, PE and the inherent risks with  anesthesia/conscious sedation.   For PCNL I described the risks including positioning injury, pneumothorax, hydrothorax, need for chest tube, inability to clear stone burden, renal laceration, arterial venous fistula or malformation, need for embolization of kidney, loss of kidney or renal function, need for repeat procedure, need for prolonged nephrostomy tube, ureteral avulsion, MI, CVA, PE and the inherent risks of general anesthesia.   - The patient would like to proceed with left ESWL - Urinalysis, Routine w reflex microscopic   No follow-ups on file.  [name], MD  [facility]

## 2023-12-28 ENCOUNTER — Encounter (HOSPITAL_COMMUNITY)
Admission: RE | Admit: 2023-12-28 | Discharge: 2023-12-28 | Disposition: A | Discharge status: Home / Self Care | Source: Ambulatory Visit | Attending: Urology | Admitting: Urology

## 2023-12-28 ENCOUNTER — Telehealth: Payer: Self-pay | Admitting: Family Medicine

## 2023-12-28 DIAGNOSIS — Z0279 Encounter for issue of other medical certificate: Secondary | ICD-10-CM

## 2023-12-28 LAB — URINE CULTURE

## 2023-12-28 NOTE — Telephone Encounter (Signed)
 Pt  dropped off disability forms to be completed and signed.  Form Fee Paid? (Y/N)   yes         If NO, form is placed on front office manager desk to hold until payment received. If YES, then form will be placed in the RX/HH Nurse Coordinators box for completion.  Form will not be processed until payment is received

## 2023-12-31 ENCOUNTER — Ambulatory Visit
Admission: RE | Admit: 2023-12-31 | Discharge: 2023-12-31 | Disposition: A | Discharge status: Home / Self Care | Source: Ambulatory Visit | Attending: Family Medicine

## 2023-12-31 DIAGNOSIS — Z1231 Encounter for screening mammogram for malignant neoplasm of breast: Secondary | ICD-10-CM

## 2024-01-01 ENCOUNTER — Encounter (HOSPITAL_COMMUNITY)
Admission: RE | Disposition: A | Discharge status: Home / Self Care | Payer: Self-pay | Source: Home / Self Care | Attending: Urology

## 2024-01-01 ENCOUNTER — Ambulatory Visit (HOSPITAL_COMMUNITY)
Admission: RE | Admit: 2024-01-01 | Discharge: 2024-01-01 | Disposition: A | Discharge status: Home / Self Care | Attending: Urology | Admitting: Urology

## 2024-01-01 ENCOUNTER — Ambulatory Visit (HOSPITAL_COMMUNITY)

## 2024-01-01 DIAGNOSIS — Z1509 Genetic susceptibility to other malignant neoplasm: Secondary | ICD-10-CM | POA: Insufficient documentation

## 2024-01-01 DIAGNOSIS — N200 Calculus of kidney: Secondary | ICD-10-CM | POA: Diagnosis present

## 2024-01-01 DIAGNOSIS — Z79899 Other long term (current) drug therapy: Secondary | ICD-10-CM | POA: Diagnosis not present

## 2024-01-01 HISTORY — PX: EXTRACORPOREAL SHOCK WAVE LITHOTRIPSY: SHX1557

## 2024-01-01 SURGERY — LITHOTRIPSY, ESWL
Anesthesia: LOCAL | Laterality: Left

## 2024-01-01 MED ORDER — ONDANSETRON 4 MG PO TBDP
4.0000 mg | ORAL_TABLET | Freq: Three times a day (TID) | ORAL | 0 refills | Status: DC | PRN
Start: 2024-01-01 — End: 2024-03-26

## 2024-01-01 MED ORDER — DIAZEPAM 5 MG PO TABS
10.0000 mg | ORAL_TABLET | Freq: Once | ORAL | Status: AC
  Administered 2024-01-01: 10 mg via ORAL
  Filled 2024-01-01: qty 2

## 2024-01-01 MED ORDER — DIPHENHYDRAMINE HCL 25 MG PO CAPS
25.0000 mg | ORAL_CAPSULE | ORAL | Status: AC
  Administered 2024-01-01: 25 mg via ORAL
  Filled 2024-01-01: qty 1

## 2024-01-01 MED ORDER — OXYCODONE-ACETAMINOPHEN 5-325 MG PO TABS: 1.0000 | ORAL_TABLET | Freq: Four times a day (QID) | ORAL | 0 refills | Status: DC | PRN

## 2024-01-01 MED ORDER — TAMSULOSIN HCL 0.4 MG PO CAPS: 0.4000 mg | ORAL_CAPSULE | Freq: Every day | ORAL | 0 refills | Status: DC

## 2024-01-01 NOTE — Progress Notes (Signed)
 Left flank bruised and red post eswl

## 2024-01-01 NOTE — Interval H&P Note (Signed)
 History and Physical Interval Note:  01/01/2024 7:53 AM  Laurie Kelley  has presented today for surgery, with the diagnosis of left neprolithiasis.  The various methods of treatment have been discussed with the patient and family. After consideration of risks, benefits and other options for treatment, the patient has consented to  Procedure(s): LITHOTRIPSY, ESWL (Left) as a surgical intervention.  The patient's history has been reviewed, patient examined, no change in status, stable for surgery.  I have reviewed the patient's chart and labs.  Questions were answered to the patient's satisfaction.     [name]

## 2024-01-02 ENCOUNTER — Encounter (HOSPITAL_COMMUNITY): Payer: Self-pay | Admitting: Urology

## 2024-01-03 NOTE — Telephone Encounter (Signed)
 PCP completed and signed disability forms. They have been faxed to [employer] at fax number [phone]. Patient has been contacted and informed they are complete. Copy at front desk.

## 2024-01-04 ENCOUNTER — Encounter (HOSPITAL_COMMUNITY): Payer: Self-pay | Admitting: Urology

## 2024-01-16 ENCOUNTER — Encounter: Payer: Self-pay | Admitting: Urology

## 2024-01-16 ENCOUNTER — Ambulatory Visit (HOSPITAL_COMMUNITY)
Admission: RE | Admit: 2024-01-16 | Discharge: 2024-01-16 | Disposition: A | Discharge status: Home / Self Care | Source: Ambulatory Visit | Attending: Urology | Admitting: Urology

## 2024-01-16 ENCOUNTER — Ambulatory Visit (INDEPENDENT_AMBULATORY_CARE_PROVIDER_SITE_OTHER): Admitting: Urology

## 2024-01-16 VITALS — BP 106/62 | HR 71

## 2024-01-16 DIAGNOSIS — N3000 Acute cystitis without hematuria: Secondary | ICD-10-CM

## 2024-01-16 DIAGNOSIS — N200 Calculus of kidney: Secondary | ICD-10-CM | POA: Diagnosis present

## 2024-01-16 LAB — STONE ANALYSIS
Calcium Oxalate Monohydrate: 40 %
Calcium Phosphate (Carbonate): 60 %
Weight Calculi: 76 mg

## 2024-01-16 LAB — URINALYSIS, ROUTINE W REFLEX MICROSCOPIC
Bilirubin, UA: NEGATIVE
Glucose, UA: NEGATIVE
Ketones, UA: NEGATIVE
Nitrite, UA: NEGATIVE
RBC, UA: NEGATIVE
Specific Gravity, UA: 1.025 (ref 1.005–1.030)
Urobilinogen, Ur: 1 mg/dL (ref 0.2–1.0)
pH, UA: 6 (ref 5.0–7.5)

## 2024-01-16 LAB — MICROSCOPIC EXAMINATION

## 2024-01-16 MED ORDER — NITROFURANTOIN MONOHYD MACRO 100 MG PO CAPS: 100.0000 mg | ORAL_CAPSULE | Freq: Two times a day (BID) | ORAL | 0 refills | Status: DC

## 2024-01-16 NOTE — Patient Instructions (Signed)

## 2024-01-16 NOTE — Addendum Note (Signed)
 Addended by: MALACHY SLICE on: 01/16/2024 03:29 PM   Modules accepted: Orders

## 2024-01-16 NOTE — Progress Notes (Signed)
 01/16/2024 11:29 AM   Laurie Kelley May 1970/07/12 [id number]  Referring provider: [name], DO [address] [place],  [place] [address]  Folllowup nephrolithiasis   HPI: Laurie Kelley is a 53yo here for followup for nephrolithiasis. She underwetn ESWl 2 weeks ago and passed numerous fragments. She was doing well until 2 days ago when she developed straining to urinate, urinary frequency, and urinary urgency. No hematuria or dysuria. KUB from today shows 3-66mm left upper pole calculus. 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   Lynch syndrome    PONV (postoperative nausea and vomiting)     Surgical History: Past Surgical History:  Procedure Laterality Date   ABDOMINAL HYSTERECTOMY N/A 03/08/2022   Procedure: HYSTERECTOMY ABDOMINAL;  Surgeon: Ozan, Jennifer, DO;  Location: [facility];  Service: Gynecology;  Laterality: N/A;   ADJACENT TISSUE TRANSFER/TISSUE REARRANGEMENT Right 11/22/2022   Procedure: Fat grafting to right  temple from right buttocks after brain surgery;  Surgeon: Lowery Estefana RAMAN, DO;  Location: [facility];  Service: Plastics;  Laterality: Right;   APPLICATION OF CRANIAL NAVIGATION Left 02/18/2021   Procedure: APPLICATION OF CRANIAL NAVIGATION;  Surgeon: Cheryle Debby LABOR, MD;  Location: [facility];  Service: Neurosurgery;  Laterality: Left;   APPLICATION OF CRANIAL NAVIGATION N/A 05/18/2021   Procedure: APPLICATION OF CRANIAL NAVIGATION;  Surgeon: Cheryle Debby LABOR, MD;  Location: [facility];  Service: Neurosurgery;  Laterality: N/A;   BIOPSY  06/28/2022   Procedure: BIOPSY;  Surgeon: San Sandor GAILS, DO;  Location: [facility];  Service: Gastroenterology;;   BRAIN SURGERY     CHOLECYSTECTOMY  1993   COLONOSCOPY WITH PROPOFOL  N/A 06/28/2022   Procedure: COLONOSCOPY WITH PROPOFOL ;  Surgeon: San Sandor GAILS, DO;  Location: [facility];  Service: Gastroenterology;  Laterality: N/A;   CRANIOTOMY Left 02/18/2021   Procedure: Left orbitozygomatic craniotomy for tumor resection  with Brain Lab;  Surgeon: Cheryle Debby LABOR, MD;  Location: [facility];  Service: Neurosurgery;  Laterality: Left;   CRANIOTOMY Right 05/18/2021   Procedure: Right Orbitozygomatic craniotomy for tumor with brainlab;  Surgeon: Cheryle Debby LABOR, MD;  Location: [facility];  Service: Neurosurgery;  Laterality: Right;   ESOPHAGOGASTRODUODENOSCOPY (EGD) WITH PROPOFOL  N/A 06/28/2022   Procedure: ESOPHAGOGASTRODUODENOSCOPY (EGD) WITH PROPOFOL ;  Surgeon: San Sandor GAILS, DO;  Location: [facility];  Service: Gastroenterology;  Laterality: N/A;   EXTRACORPOREAL SHOCK WAVE LITHOTRIPSY Right 12/04/2023   Procedure: LITHOTRIPSY, ESWL;  Surgeon: Sherrilee Belvie LITTIE, MD;  Location: [facility];  Service: Urology;  Laterality: Right;   EXTRACORPOREAL SHOCK WAVE LITHOTRIPSY Left 01/01/2024   Procedure: LITHOTRIPSY, ESWL;  Surgeon: Sherrilee Belvie LITTIE, MD;  Location: [facility];  Service: Urology;  Laterality: Left;   PARATHYROIDECTOMY Right 04/23/2023   Procedure: RIGHT INFERIOR PARATHYROIDECTOMY;  Surgeon: Eletha Boas, MD;  Location: [facility];  Service: General;  Laterality: Right;   POLYPECTOMY  06/28/2022   Procedure: POLYPECTOMY;  Surgeon: San Sandor GAILS, DO;  Location: [facility];  Service: Gastroenterology;;   SALPINGOOPHORECTOMY Bilateral 03/08/2022   Procedure: OPEN SALPINGO OOPHORECTOMY;  Surgeon: Marilynn Nest, DO;  Location: [facility];  Service: Gynecology;  Laterality: Bilateral;   SHOULDER  ARTHROSCOPY Right 11/06/2019   Procedure: ARTHROSCOPY RIGHT SHOULDER, DEBRIDEMENT X TWO STRUCTURES; OPEN ROTATOR CUFF REPAIR;  Surgeon: Margrette Taft BRAVO, MD;  Location: [facility];  Service: Orthopedics;  Laterality: Right;    Home Medications:  Allergies as of 01/16/2024       Reactions   Gadolinium Derivatives Other (See Comments)   Pt reports she developed chest tightness, burning of her back, and weakness after MRI contrast 11/2021. Per Dr. Shoshana, he suggests 13 hr protocol prior to MRI.    Iodinated Contrast Media Nausea And Vomiting   Saline Nausea And Vomiting   Patient states she has nausea and vomiting if she has it for a long time, she did not have any N/V after her last ESL procedure   Sodium Chloride  Nausea And Vomiting        Medication List        Accurate as of January 16, 2024 11:29 AM. If you have any questions, ask your nurse or doctor.          atorvastatin  20 MG tablet Commonly known as: LIPITOR Take 1 tablet (20 mg total) by mouth daily.   colestipol  1 g tablet Commonly known as: Colestid  Take 1 tablet (1 g total) by mouth daily.   DULoxetine  60 MG capsule Commonly known as: Cymbalta  Take 1 capsule (60 mg total) by mouth daily.   gabapentin  600 MG tablet Commonly known as: NEURONTIN  Take 1 tablet (600 mg total) by mouth 3 (three) times daily.   hydrocortisone  1 % ointment Apply 1 Application topically 2 (two) times daily as needed for itching.   nitrofurantoin  (macrocrystal-monohydrate) 100 MG capsule Commonly known as: MACROBID  Take 1 capsule (100 mg total) by mouth every 12 (twelve) hours.   ondansetron  4 MG disintegrating tablet Commonly known as: ZOFRAN -ODT Take 1 tablet (4 mg total) by mouth every 8 (eight) hours as needed for nausea.   oxyCODONE -acetaminophen  5-325 MG tablet Commonly known as: PERCOCET/ROXICET Take 1 tablet by mouth every 6 (six) hours as needed for severe pain (pain score 7-10).   oxyCODONE -acetaminophen  5-325 MG  tablet Commonly known as: Percocet Take 1 tablet by mouth every 6 (six) hours as needed.   PROBIOTIC DAILY PO Take 1 tablet by mouth daily. Olly   tamsulosin  0.4 MG Caps capsule Commonly known as: FLOMAX  Take 1 capsule (0.4 mg total) by mouth daily.        Allergies:  Allergies  Allergen Reactions   Gadolinium Derivatives Other (See Comments)    Pt reports she developed chest tightness, burning of her back, and weakness after MRI contrast 11/2021. Per Dr. Shoshana, he suggests 13 hr protocol prior to MRI.    Iodinated Contrast Media Nausea And Vomiting   Saline Nausea And Vomiting    Patient states she has nausea and vomiting if she has it for a long time, she did not have any N/V after her last ESL procedure   Sodium Chloride  Nausea And Vomiting    Family History: Family History  Problem Relation Age of Onset   Cancer Mother    Hyperlipidemia Mother    Lung cancer Mother    Hypertension Father    Heart disease Father    Other Sister        meningiomas/tumors  x11   Other Daughter        Lynch syndrome   Brain cancer Maternal Aunt        d. 77s-60s   Lung cancer Maternal Aunt    Bone cancer Maternal Aunt        d. 50s-60s   Liver cancer Paternal Uncle    Lung cancer Paternal Grandmother        d.85   Rectal cancer Neg Hx    Esophageal cancer Neg Hx    Stomach cancer Neg Hx    Breast cancer Neg Hx    Colon polyps Neg Hx    Colon cancer Neg Hx     Social History:  reports that she has never smoked. She has never used smokeless tobacco. She reports that she does not drink alcohol and does not use drugs.  ROS: All other review of systems were reviewed and are negative except what is noted above in HPI  Physical Exam: BP 106/62   Pulse 71   Constitutional:  Alert and oriented, No acute distress. HEENT: [place] AT, moist mucus membranes.  Trachea midline, no masses. Cardiovascular: No clubbing, cyanosis, or edema. Respiratory: Normal respiratory effort, no  increased work of breathing. GI: Abdomen is soft, nontender, nondistended, no abdominal masses GU: No CVA tenderness.  Lymph: No cervical or inguinal lymphadenopathy. Skin: No rashes, bruises or suspicious lesions. Neurologic: Grossly intact, no focal deficits, moving all 4 extremities. Psychiatric: Normal mood and affect.  Laboratory Data: Lab Results  Component Value Date   WBC 10.8 (H) 11/25/2023   HGB 12.5 11/25/2023   HCT 38.1 11/25/2023   MCV 89.0 11/25/2023   PLT 315 11/25/2023    Lab Results  Component Value Date   CREATININE 0.84 11/25/2023    No results found for: PSA  No results found for: TESTOSTERONE  Lab Results  Component Value Date   HGBA1C 5.3 02/27/2023    Urinalysis    Component Value Date/Time   COLORURINE STRAW (A) 11/25/2023 2146   APPEARANCEUR Clear 12/26/2023 1040   LABSPEC 1.013 11/25/2023 2146   PHURINE 7.0 11/25/2023 2146   GLUCOSEU Negative 12/26/2023 1040   HGBUR MODERATE (A) 11/25/2023 2146   BILIRUBINUR Negative 12/26/2023 1040   KETONESUR NEGATIVE 11/25/2023 2146   PROTEINUR Trace 12/26/2023 1040   PROTEINUR NEGATIVE 11/25/2023 2146   UROBILINOGEN negative 08/18/2015 1234   NITRITE Positive (A) 12/26/2023 1040   NITRITE NEGATIVE 11/25/2023 2146   LEUKOCYTESUR 2+ (A) 12/26/2023 1040   LEUKOCYTESUR MODERATE (A) 11/25/2023 2146    Lab Results  Component Value Date   LABMICR See below: 12/26/2023   WBCUA >30 (A) 12/26/2023   RBCUA 3-10 (A) 04/11/2018   LABEPIT 0-10 12/26/2023   MUCUS Present (A) 12/26/2023   BACTERIA Many (A) 12/26/2023    Pertinent Imaging: KUb today: Images reviewed and discussed with the patient  Results for orders placed during the hospital encounter of 01/01/24  DG Abd 1 View  Narrative CLINICAL DATA:  Left-sided nephrolithiasis.  Pre lithotripsy.  EXAM: ABDOMEN - 1 VIEW  COMPARISON:  CT 11/22/2023  FINDINGS: Supine view of the abdomen and pelvis. Left renal calcifications of up to 9  mm. No right-sided renal calculi. No calcific densities over the abdominal ureters. Multiple pelvic calcifications are likely phleboliths.  Non-obstructive bowel gas pattern.  IMPRESSION: Left nephrolithiasis.   Electronically Signed By: Rockey Kilts M.D. On: 01/01/2024 10:18  No results found for this or any previous visit.  No results found for this or  any previous visit.  No results found for this or any previous visit.  No results found for this or any previous visit.  No results found for this or any previous visit.  No results found for this or any previous visit.  Results for orders placed during the hospital encounter of 11/22/23  CT RENAL STONE STUDY  Narrative CLINICAL DATA:  Left flank pain, history of kidney stones  EXAM: CT ABDOMEN AND PELVIS WITHOUT CONTRAST  TECHNIQUE: Multidetector CT imaging of the abdomen and pelvis was performed following the standard protocol without IV contrast.  RADIATION DOSE REDUCTION: This exam was performed according to the departmental dose-optimization program which includes automated exposure control, adjustment of the mA and/or kV according to patient size and/or use of iterative reconstruction technique.  COMPARISON:  Nov 04, 2023  FINDINGS: Lower chest: No acute abnormality.  Hepatobiliary: No focal liver abnormality is seen. Status post cholecystectomy. No biliary dilatation.  Pancreas: Unremarkable. No pancreatic ductal dilatation or surrounding inflammatory changes.  Spleen: Normal in size without focal abnormality.  Adrenals/Urinary Tract: Adrenal glands are normal.  Kidneys are normal size. Comparison with prior examinations demonstrates no significant change in the bilateral nephrolithiasis with a 9 mm stone in the right renal pelvis without hydronephrosis. Several calcifications in the lower pole calices of the left kidney consistent with loosely formed staghorn calculus unchanged since prior  examination. No ureteral stones no hydronephrosis no bladder stones  Stomach/Bowel: Stomach is within normal limits. Appendix appears normal. No evidence of bowel wall thickening, distention, or inflammatory changes.  Vascular/Lymphatic: No significant vascular findings are present. No enlarged abdominal or pelvic lymph nodes.  Reproductive: Uterus and bilateral adnexa are unremarkable.  Other: No abdominal wall hernia or abnormality. No abdominopelvic ascites.  Musculoskeletal: No acute or significant osseous findings.  IMPRESSION: *No acute abnormality of the abdomen or pelvis. *Bilateral nephrolithiasis without hydronephrosis. *Status post cholecystectomy.   Electronically Signed By: Franky Chard M.D. On: 11/22/2023 12:42   Assessment & Plan:    1. Kidney stones (Primary) --We discussed the management of kidney stones. These options include observation, ureteroscopy, shockwave lithotripsy (ESWL) and percutaneous nephrolithotomy (PCNL). We discussed which options are relevant to the patient's stone(s). We discussed the natural history of kidney stones as well as the complications of untreated stones and the impact on quality of life without treatment as well as with each of the above listed treatments. We also discussed the efficacy of each treatment in its ability to clear the stone burden. With any of these management options I discussed the signs and symptoms of infection and the need for emergent treatment should these be experienced. For each option we discussed the ability of each procedure to clear the patient of their stone burden.   For observation I described the risks which include but are not limited to silent renal damage, life-threatening infection, need for emergent surgery, failure to pass stone and pain.   For ureteroscopy I described the risks which include bleeding, infection, damage to contiguous structures, positioning injury, ureteral stricture, ureteral  avulsion, ureteral injury, need for prolonged ureteral stent, inability to perform ureteroscopy, need for an interval procedure, inability to clear stone burden, stent discomfort/pain, heart attack, stroke, pulmonary embolus and the inherent risks with general anesthesia.   For shockwave lithotripsy I described the risks which include arrhythmia, kidney contusion, kidney hemorrhage, need for transfusion, pain, inability to adequately break up stone, inability to pass stone fragments, Steinstrasse, infection associated with obstructing stones, need for alternate surgical procedure,  need for repeat shockwave lithotripsy, MI, CVA, PE and the inherent risks with anesthesia/conscious sedation.   For PCNL I described the risks including positioning injury, pneumothorax, hydrothorax, need for chest tube, inability to clear stone burden, renal laceration, arterial venous fistula or malformation, need for embolization of kidney, loss of kidney or renal function, need for repeat procedure, need for prolonged nephrostomy tube, ureteral avulsion, MI, CVA, PE and the inherent risks of general anesthesia.   - The patient would like to proceed with medical expulsive therapy. Followup 1 month with a KUB - Urinalysis, Routine w reflex microscopic  2. Acute cystitis -urine for culture Macrobid  100mg  BID fo 7 days  No follow-ups on file.  [name], MD  [facility] [place]

## 2024-01-18 LAB — URINE CULTURE

## 2024-02-04 LAB — STONE ANALYSIS
Calcium Oxalate Monohydrate: 40 %
Calcium Phosphate (Carbonate): 60 %
Weight Calculi: 92 mg

## 2024-02-13 ENCOUNTER — Ambulatory Visit: Admitting: Urology

## 2024-02-13 ENCOUNTER — Ambulatory Visit (HOSPITAL_COMMUNITY)
Admission: RE | Admit: 2024-02-13 | Discharge: 2024-02-13 | Disposition: A | Discharge status: Home / Self Care | Source: Ambulatory Visit | Attending: Urology | Admitting: Urology

## 2024-02-13 ENCOUNTER — Telehealth: Payer: Self-pay

## 2024-02-13 VITALS — BP 101/64 | HR 76

## 2024-02-13 DIAGNOSIS — N3000 Acute cystitis without hematuria: Secondary | ICD-10-CM | POA: Diagnosis not present

## 2024-02-13 DIAGNOSIS — N200 Calculus of kidney: Secondary | ICD-10-CM

## 2024-02-13 LAB — MICROSCOPIC EXAMINATION
RBC, Urine: >30 /HPF — AB (ref 0–2)
WBC, UA: >30 /HPF — AB (ref 0–5)

## 2024-02-13 LAB — URINALYSIS, ROUTINE W REFLEX MICROSCOPIC
Bilirubin, UA: NEGATIVE
Glucose, UA: NEGATIVE
Ketones, UA: NEGATIVE
Nitrite, UA: NEGATIVE
Specific Gravity, UA: 1.025 (ref 1.005–1.030)
Urobilinogen, Ur: 1 mg/dL (ref 0.2–1.0)
pH, UA: 6 (ref 5.0–7.5)

## 2024-02-13 MED ORDER — SULFAMETHOXAZOLE-TRIMETHOPRIM 800-160 MG PO TABS: 1.0000 | ORAL_TABLET | Freq: Two times a day (BID) | ORAL | 0 refills | Status: DC

## 2024-02-13 NOTE — Progress Notes (Unsigned)
 02/13/2024 10:08 AM   Laurie Kelley May June 19, 1971 [id number]  Referring provider: [name], DO [address] [place],  [place] [address]  No chief complaint on file.   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   Lynch syndrome    PONV (postoperative nausea and vomiting)     Surgical History: Past Surgical History:  Procedure Laterality Date   ABDOMINAL HYSTERECTOMY N/A 03/08/2022   Procedure: HYSTERECTOMY ABDOMINAL;  Surgeon: Ozan, Jennifer, DO;  Location: [facility];  Service: Gynecology;  Laterality: N/A;   ADJACENT TISSUE TRANSFER/TISSUE REARRANGEMENT Right 11/22/2022   Procedure: Fat grafting to right  temple from right buttocks after brain surgery;  Surgeon: Lowery Estefana RAMAN, DO;  Location: [facility];  Service: Plastics;  Laterality: Right;   APPLICATION OF CRANIAL NAVIGATION Left 02/18/2021   Procedure: APPLICATION OF CRANIAL NAVIGATION;  Surgeon: Cheryle Debby LABOR, MD;  Location: [facility];  Service: Neurosurgery;  Laterality: Left;   APPLICATION OF CRANIAL NAVIGATION N/A 05/18/2021   Procedure: APPLICATION OF CRANIAL NAVIGATION;  Surgeon: Cheryle Debby LABOR, MD;  Location: [facility];  Service: Neurosurgery;  Laterality: N/A;   BIOPSY  06/28/2022   Procedure: BIOPSY;  Surgeon: San Sandor GAILS, DO;  Location: [facility];  Service: Gastroenterology;;   BRAIN SURGERY     CHOLECYSTECTOMY  1993   COLONOSCOPY WITH PROPOFOL  N/A 06/28/2022   Procedure: COLONOSCOPY WITH PROPOFOL ;  Surgeon:  San Sandor GAILS, DO;  Location: [facility];  Service: Gastroenterology;  Laterality: N/A;   CRANIOTOMY Left 02/18/2021   Procedure: Left orbitozygomatic craniotomy for tumor resection  with Brain Lab;  Surgeon: Cheryle Debby LABOR, MD;  Location: [facility];  Service: Neurosurgery;  Laterality: Left;   CRANIOTOMY Right 05/18/2021   Procedure: Right Orbitozygomatic craniotomy for tumor with brainlab;  Surgeon: Cheryle Debby LABOR, MD;  Location: [facility] OR;  Service: Neurosurgery;  Laterality: Right;   ESOPHAGOGASTRODUODENOSCOPY (EGD) WITH PROPOFOL  N/A 06/28/2022   Procedure: ESOPHAGOGASTRODUODENOSCOPY (EGD) WITH PROPOFOL ;  Surgeon: San Sandor GAILS, DO;  Location: [facility];  Service: Gastroenterology;  Laterality: N/A;   EXTRACORPOREAL SHOCK WAVE LITHOTRIPSY Right 12/04/2023   Procedure: LITHOTRIPSY, ESWL;  Surgeon: Sherrilee Belvie LITTIE, MD;  Location: [facility];  Service: Urology;  Laterality: Right;   EXTRACORPOREAL SHOCK WAVE LITHOTRIPSY Left 01/01/2024   Procedure: LITHOTRIPSY, ESWL;  Surgeon: Sherrilee Belvie LITTIE, MD;  Location: [facility];  Service: Urology;  Laterality: Left;   PARATHYROIDECTOMY Right 04/23/2023   Procedure: RIGHT INFERIOR PARATHYROIDECTOMY;  Surgeon: Eletha Boas, MD;  Location: [facility];  Service: General;  Laterality: Right;   POLYPECTOMY  06/28/2022   Procedure: POLYPECTOMY;  Surgeon: San Sandor GAILS, DO;  Location: [facility];  Service: Gastroenterology;;   SALPINGOOPHORECTOMY Bilateral 03/08/2022   Procedure: OPEN SALPINGO OOPHORECTOMY;  Surgeon: Marilynn Nest, DO;  Location: [facility];  Service: Gynecology;  Laterality: Bilateral;   SHOULDER ARTHROSCOPY Right 11/06/2019   Procedure: ARTHROSCOPY RIGHT SHOULDER, DEBRIDEMENT X  TWO STRUCTURES; OPEN ROTATOR CUFF REPAIR;  Surgeon: Margrette Taft BRAVO, MD;  Location: [facility];  Service: Orthopedics;  Laterality: Right;    Home Medications:  Allergies as of 02/13/2024       Reactions   Gadolinium Derivatives Other (See Comments)   Pt  reports she developed chest tightness, burning of her back, and weakness after MRI contrast 11/2021. Per Dr. Shoshana, he suggests 13 hr protocol prior to MRI.    Iodinated Contrast Media Nausea And Vomiting   Saline Nausea And Vomiting   Patient states she has nausea and vomiting if she has it for a long time, she did not have any N/V after her last ESL procedure   Sodium Chloride  Nausea And Vomiting        Medication List        Accurate as of February 13, 2024 10:08 AM. If you have any questions, ask your nurse or doctor.          atorvastatin  20 MG tablet Commonly known as: LIPITOR Take 1 tablet (20 mg total) by mouth daily.   colestipol  1 g tablet Commonly known as: Colestid  Take 1 tablet (1 g total) by mouth daily.   DULoxetine  60 MG capsule Commonly known as: Cymbalta  Take 1 capsule (60 mg total) by mouth daily.   gabapentin  600 MG tablet Commonly known as: NEURONTIN  Take 1 tablet (600 mg total) by mouth 3 (three) times daily.   hydrocortisone  1 % ointment Apply 1 Application topically 2 (two) times daily as needed for itching.   nitrofurantoin  (macrocrystal-monohydrate) 100 MG capsule Commonly known as: MACROBID  Take 1 capsule (100 mg total) by mouth every 12 (twelve) hours.   nitrofurantoin  (macrocrystal-monohydrate) 100 MG capsule Commonly known as: MACROBID  Take 1 capsule (100 mg total) by mouth every 12 (twelve) hours.   ondansetron  4 MG disintegrating tablet Commonly known as: ZOFRAN -ODT Take 1 tablet (4 mg total) by mouth every 8 (eight) hours as needed for nausea.   oxyCODONE -acetaminophen  5-325 MG tablet Commonly known as: PERCOCET/ROXICET Take 1 tablet by mouth every 6 (six) hours as needed for severe pain (pain score 7-10).   oxyCODONE -acetaminophen  5-325 MG tablet Commonly known as: Percocet Take 1 tablet by mouth every 6 (six) hours as needed.   PROBIOTIC DAILY PO Take 1 tablet by mouth daily. Olly   tamsulosin  0.4 MG Caps  capsule Commonly known as: FLOMAX  Take 1 capsule (0.4 mg total) by mouth daily.        Allergies:  Allergies  Allergen Reactions   Gadolinium Derivatives Other (See Comments)    Pt reports she developed chest tightness, burning of her back, and weakness after MRI contrast 11/2021. Per Dr. Shoshana, he suggests 13 hr protocol prior to MRI.    Iodinated Contrast Media Nausea And Vomiting   Saline Nausea And Vomiting    Patient states she has nausea and vomiting if she has it for a long time, she did not have any N/V after her last ESL procedure   Sodium Chloride  Nausea And Vomiting    Family History: Family History  Problem Relation Age of Onset   Cancer Mother    Hyperlipidemia Mother    Lung cancer Mother    Hypertension Father    Heart disease Father    Other Sister        meningiomas/tumors x11   Other Daughter        Lynch syndrome   Brain cancer Maternal Aunt        d. 1s-60s  Lung cancer Maternal Aunt    Bone cancer Maternal Aunt        d. 50s-60s   Liver cancer Paternal Uncle    Lung cancer Paternal Grandmother        d.85   Rectal cancer Neg Hx    Esophageal cancer Neg Hx    Stomach cancer Neg Hx    Breast cancer Neg Hx    Colon polyps Neg Hx    Colon cancer Neg Hx     Social History:  reports that she has never smoked. She has never used smokeless tobacco. She reports that she does not drink alcohol and does not use drugs.  ROS: All other review of systems were reviewed and are negative except what is noted above in HPI  Physical Exam: BP 101/64   Pulse 76   Constitutional:  Alert and oriented, No acute distress. HEENT: [place] AT, moist mucus membranes.  Trachea midline, no masses. Cardiovascular: No clubbing, cyanosis, or edema. Respiratory: Normal respiratory effort, no increased work of breathing. GI: Abdomen is soft, nontender, nondistended, no abdominal masses GU: No CVA tenderness.  Lymph: No cervical or inguinal lymphadenopathy. Skin: No  rashes, bruises or suspicious lesions. Neurologic: Grossly intact, no focal deficits, moving all 4 extremities. Psychiatric: Normal mood and affect.  Laboratory Data: Lab Results  Component Value Date   WBC 10.8 (H) 11/25/2023   HGB 12.5 11/25/2023   HCT 38.1 11/25/2023   MCV 89.0 11/25/2023   PLT 315 11/25/2023    Lab Results  Component Value Date   CREATININE 0.84 11/25/2023    No results found for: PSA  No results found for: TESTOSTERONE  Lab Results  Component Value Date   HGBA1C 5.3 02/27/2023    Urinalysis    Component Value Date/Time   COLORURINE STRAW (A) 11/25/2023 2146   APPEARANCEUR Clear 01/16/2024 1108   LABSPEC 1.013 11/25/2023 2146   PHURINE 7.0 11/25/2023 2146   GLUCOSEU Negative 01/16/2024 1108   HGBUR MODERATE (A) 11/25/2023 2146   BILIRUBINUR Negative 01/16/2024 1108   KETONESUR NEGATIVE 11/25/2023 2146   PROTEINUR Trace 01/16/2024 1108   PROTEINUR NEGATIVE 11/25/2023 2146   UROBILINOGEN negative 08/18/2015 1234   NITRITE Negative 01/16/2024 1108   NITRITE NEGATIVE 11/25/2023 2146   LEUKOCYTESUR 2+ (A) 01/16/2024 1108   LEUKOCYTESUR MODERATE (A) 11/25/2023 2146    Lab Results  Component Value Date   LABMICR See below: 01/16/2024   WBCUA 11-30 (A) 01/16/2024   RBCUA 3-10 (A) 04/11/2018   LABEPIT 0-10 01/16/2024   MUCUS Present (A) 12/26/2023   BACTERIA Moderate (A) 01/16/2024    Pertinent Imaging: *** Results for orders placed during the hospital encounter of 01/16/24  DG Abd 1 View  Narrative CLINICAL DATA:  Kidney stone.  Left-sided abdominal tenderness.  EXAM: ABDOMEN - 1 VIEW  COMPARISON:  Most recent radiograph 01/01/2024, most recent CT 11/22/2023  FINDINGS: The left renal calculi on prior exam are not definitively seen, there may be a 5 mm stone fragment projecting over the lower pole. No stone or stone fragments over the course of the ureters. There are multiple pelvic phleboliths which partially limited  assessment in this region. Normal bowel gas pattern.  IMPRESSION: The left renal calculi on prior exam are not definitively seen, there may be a 5 mm stone fragment projecting over the lower pole.   Electronically Signed By: Andrea Gasman M.D. On: 01/23/2024 17:35  No results found for this or any previous visit.  No results found for  this or any previous visit.  No results found for this or any previous visit.  No results found for this or any previous visit.  No results found for this or any previous visit.  No results found for this or any previous visit.  Results for orders placed during the hospital encounter of 11/22/23  CT RENAL STONE STUDY  Narrative CLINICAL DATA:  Left flank pain, history of kidney stones  EXAM: CT ABDOMEN AND PELVIS WITHOUT CONTRAST  TECHNIQUE: Multidetector CT imaging of the abdomen and pelvis was performed following the standard protocol without IV contrast.  RADIATION DOSE REDUCTION: This exam was performed according to the departmental dose-optimization program which includes automated exposure control, adjustment of the mA and/or kV according to patient size and/or use of iterative reconstruction technique.  COMPARISON:  Nov 04, 2023  FINDINGS: Lower chest: No acute abnormality.  Hepatobiliary: No focal liver abnormality is seen. Status post cholecystectomy. No biliary dilatation.  Pancreas: Unremarkable. No pancreatic ductal dilatation or surrounding inflammatory changes.  Spleen: Normal in size without focal abnormality.  Adrenals/Urinary Tract: Adrenal glands are normal.  Kidneys are normal size. Comparison with prior examinations demonstrates no significant change in the bilateral nephrolithiasis with a 9 mm stone in the right renal pelvis without hydronephrosis. Several calcifications in the lower pole calices of the left kidney consistent with loosely formed staghorn calculus unchanged since prior examination. No  ureteral stones no hydronephrosis no bladder stones  Stomach/Bowel: Stomach is within normal limits. Appendix appears normal. No evidence of bowel wall thickening, distention, or inflammatory changes.  Vascular/Lymphatic: No significant vascular findings are present. No enlarged abdominal or pelvic lymph nodes.  Reproductive: Uterus and bilateral adnexa are unremarkable.  Other: No abdominal wall hernia or abnormality. No abdominopelvic ascites.  Musculoskeletal: No acute or significant osseous findings.  IMPRESSION: *No acute abnormality of the abdomen or pelvis. *Bilateral nephrolithiasis without hydronephrosis. *Status post cholecystectomy.   Electronically Signed By: Franky Chard M.D. On: 11/22/2023 12:42   Assessment & Plan:    1. Kidney stones (Primary) Followup 3 months with [facility] - Urinalysis, Routine w reflex microscopic  2. Acute cystitis without hematuria Bactrim  Ds BID for 7 days -urine for culture   No follow-ups on file.  [name], MD  [facility] [place]

## 2024-02-13 NOTE — Telephone Encounter (Signed)
 Return call to patient. Patient wanted to know if she need to continued Flomax . Per MD, McKenzie pt can stop taking Flomax . Voiced understanding

## 2024-02-15 ENCOUNTER — Encounter: Payer: Self-pay | Admitting: Urology

## 2024-02-15 LAB — URINE CULTURE

## 2024-02-15 NOTE — Patient Instructions (Signed)

## 2024-02-20 ENCOUNTER — Telehealth: Payer: Self-pay | Admitting: *Deleted

## 2024-02-20 NOTE — Telephone Encounter (Signed)
 Erroneous encounter

## 2024-02-21 ENCOUNTER — Ambulatory Visit: Payer: Self-pay

## 2024-02-21 NOTE — Telephone Encounter (Signed)
 Patient declined ED and was scheduled an appt on 02/25/24.

## 2024-02-21 NOTE — Telephone Encounter (Signed)
 FYI Only or Action Required?: FYI only for provider.  Patient was last seen in primary care on 12/26/2023 by [name], DO.  Called Nurse Triage reporting Chest Pain.  Symptoms began today.  Interventions attempted: Nothing.  Symptoms are: unchanged.  Triage Disposition: Go to ED Now (or PCP Triage)  Patient/caregiver understands and will follow disposition?: No  Copied from CRM #[id number]. Topic: Clinical - Red Word Triage >> Feb 21, 2024  2:24 PM Tiffany S wrote: Red Word that prompted transfer to Nurse Triage: Chest pain Reason for Disposition  [1] Chest pain lasts > 5 minutes AND [2] occurred in past 3 days (72 hours) (Exception: Feels exactly the same as previously diagnosed heartburn and has accompanying sour taste in mouth.)  Answer Assessment - Initial Assessment Questions 1. LOCATION: Where does it hurt?       Right about R breast 2. RADIATION: Does the pain go anywhere else? (e.g., into neck, jaw, arms, back)     Denies radiation 3. ONSET: When did the chest pain begin? (Minutes, hours or days)      Woke this AM with it, did not wake her up 4. PATTERN: Does the pain come and go, or has it been constant since it started?  Does it get worse with exertion?      Constant, gets worse with exertion 5. DURATION: How long does it last (e.g., seconds, minutes, hours)     constant 6. SEVERITY: How bad is the pain?  (e.g., Scale 1-10; mild, moderate, or severe)     8 7. CARDIAC RISK FACTORS: Do you have any history of heart problems or risk factors for heart disease? (e.g., angina, prior heart attack; diabetes, high blood pressure, high cholesterol, smoker, or strong family history of heart disease)     denies 8. PULMONARY RISK FACTORS: Do you have any history of lung disease?  (e.g., blood clots in lung, asthma, emphysema, birth control pills)     denies 9. CAUSE: What do you think is causing the chest pain?     Pulled muscle 10. OTHER SYMPTOMS: Do  you have any other symptoms? (e.g., dizziness, nausea, vomiting, sweating, fever, difficulty breathing, cough)       Denies N/V, denies worsening dizziness, denies SOB.  Pt states that the pain is worse with movement of the body and also exertion. Pt was advised to go to ED. Pt refusing ED, states that she needs an appt Monday early AM.  Protocols used: Chest Pain-A-AH

## 2024-02-25 ENCOUNTER — Ambulatory Visit: Admitting: Nurse Practitioner

## 2024-02-25 ENCOUNTER — Encounter (HOSPITAL_BASED_OUTPATIENT_CLINIC_OR_DEPARTMENT_OTHER): Payer: Self-pay

## 2024-02-25 ENCOUNTER — Ambulatory Visit: Payer: Self-pay | Admitting: Nurse Practitioner

## 2024-02-25 ENCOUNTER — Ambulatory Visit (HOSPITAL_BASED_OUTPATIENT_CLINIC_OR_DEPARTMENT_OTHER)
Admission: RE | Admit: 2024-02-25 | Discharge: 2024-02-25 | Disposition: A | Discharge status: Home / Self Care | Source: Ambulatory Visit | Attending: Nurse Practitioner | Admitting: Nurse Practitioner

## 2024-02-25 ENCOUNTER — Encounter: Payer: Self-pay | Admitting: Nurse Practitioner

## 2024-02-25 VITALS — BP 102/70 | HR 86 | Temp 97.5°F | Ht 65.0 in | Wt 157.8 lb

## 2024-02-25 DIAGNOSIS — Z23 Encounter for immunization: Secondary | ICD-10-CM

## 2024-02-25 DIAGNOSIS — R0781 Pleurodynia: Secondary | ICD-10-CM | POA: Diagnosis present

## 2024-02-25 DIAGNOSIS — C801 Malignant (primary) neoplasm, unspecified: Secondary | ICD-10-CM | POA: Insufficient documentation

## 2024-02-25 MED ORDER — IOHEXOL 350 MG/ML SOLN
100.0000 mL | Freq: Once | INTRAVENOUS | Status: AC | PRN
  Administered 2024-02-25: 100 mL via INTRAVENOUS

## 2024-02-25 NOTE — Progress Notes (Signed)
 Subjective:  Patient ID: Laurie Kelley, female    DOB: [birth year], 53 y.o.   MRN: [id number]  Patient Care Team: [name], DO as PCP - General (Family Medicine) Rakes, Rock HERO, FNP (Family Medicine) Marilynn Nest, DO as Consulting Physician (Obstetrics and Gynecology) Rosslyn Dino HERO, MD as Physician Assistant (Neurosurgery) Carrol Lonni Sharper, MD as Referring Physician (Plastic Surgery)   Chief Complaint:  Chest Pain (Chest pain for 4 weeks, not getting better)   HPI: Laurie Kelley is a 53 y.o. female presenting on 02/25/2024 for Chest Pain (Chest pain for 4 weeks, not getting better)   Discussed the use of AI scribe software for clinical note transcription with the patient, who gave verbal consent to proceed.  History of Present Illness Laurie Kelley is a 53 year old female with a history of brain tumors and embolism who presents with recurrent chest pain.  She is experiencing a second episode of chest pain, similar to an episode in July. The pain is a dull ache located above the right breast, radiating towards the right arm and armpit. It intensifies with deep breathing, coughing, and certain movements, particularly when turning to the right or lying flat on her back. The pain does not occur during eating. She rates the pain as a 7 or 8 out of 10 during movement and a 3 or 4 while at rest. No shortness of breath or sour taste in the mouth, but she has a dry cough and occasional need to clear her throat.  Her past medical history includes brain tumors, with 12 tumors previously diagnosed and 8 remaining after surgical removal of 4. 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 She is medically retired due to these conditions.  Her current medications include gabapentin , atorvastatin , colestipol , and Cymbalta . She recently completed a course of Bactrim  for a UTI, which was thought to be related to a lithotripsy procedure she underwent for kidney stones  in June and July. She is no longer taking oxycodone , Flomax , or Zofran , and she has not been on acid reflux medication for years.  She reports no lumps found during her self-breast exams. She denies shortness of breath. She has a history of dizziness related to her brain tumors.      Relevant past medical, surgical, family, and social history reviewed and updated as indicated.  Allergies and medications reviewed and updated. Data reviewed: Chart in Epic.   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   Lynch syndrome    PONV (postoperative nausea and vomiting)     Past Surgical History:  Procedure Laterality Date   ABDOMINAL HYSTERECTOMY N/A 03/08/2022   Procedure: HYSTERECTOMY ABDOMINAL;  Surgeon: Ozan, Jennifer, DO;  Location: [facility];  Service: Gynecology;  Laterality: N/A;   ADJACENT TISSUE TRANSFER/TISSUE REARRANGEMENT Right 11/22/2022   Procedure: Fat grafting to right  temple from right buttocks after brain surgery;  Surgeon: Lowery Stagger  S, DO;  Location: [facility];  Service: Plastics;  Laterality: Right;   APPLICATION OF CRANIAL NAVIGATION Left 02/18/2021   Procedure: APPLICATION OF CRANIAL NAVIGATION;  Surgeon: Cheryle Debby LABOR, MD;  Location: [facility];  Service: Neurosurgery;  Laterality: Left;   APPLICATION OF CRANIAL NAVIGATION N/A 05/18/2021   Procedure: APPLICATION OF CRANIAL NAVIGATION;  Surgeon: Cheryle Debby LABOR, MD;  Location: [facility];  Service: Neurosurgery;  Laterality: N/A;   BIOPSY  06/28/2022    Procedure: BIOPSY;  Surgeon: San Sandor GAILS, DO;  Location: [facility];  Service: Gastroenterology;;   BRAIN SURGERY     CHOLECYSTECTOMY  1993   COLONOSCOPY WITH PROPOFOL  N/A 06/28/2022   Procedure: COLONOSCOPY WITH PROPOFOL ;  Surgeon: San Sandor GAILS, DO;  Location: [facility];  Service: Gastroenterology;  Laterality: N/A;   CRANIOTOMY Left 02/18/2021   Procedure: Left orbitozygomatic craniotomy for tumor resection  with Brain Lab;  Surgeon: Cheryle Debby LABOR, MD;  [facility];  Service: Neurosurgery;  Laterality: Left;   CRANIOTOMY Right 05/18/2021   Procedure: Right Orbitozygomatic craniotomy for tumor with brainlab;  Surgeon: Cheryle Debby LABOR, MD;  Location: [facility];  Service: Neurosurgery;  Laterality: Right;   ESOPHAGOGASTRODUODENOSCOPY (EGD) WITH PROPOFOL  N/A 06/28/2022   Procedure: ESOPHAGOGASTRODUODENOSCOPY (EGD) WITH PROPOFOL ;  Surgeon: San Sandor GAILS, DO;  Location: [facility];  Service: Gastroenterology;  Laterality: N/A;   EXTRACORPOREAL SHOCK WAVE LITHOTRIPSY Right 12/04/2023   Procedure: LITHOTRIPSY, ESWL;  Surgeon: Sherrilee Belvie CROME, MD;  Location: [facility];  Service: Urology;  Laterality: Right;   EXTRACORPOREAL SHOCK WAVE LITHOTRIPSY Left 01/01/2024   Procedure: LITHOTRIPSY, ESWL;  Surgeon: Sherrilee Belvie CROME, MD;  Location: [facility];  Service: Urology;  Laterality: Left;   PARATHYROIDECTOMY Right 04/23/2023   Procedure: RIGHT INFERIOR PARATHYROIDECTOMY;  Surgeon: Eletha Boas, MD;  Location: [facility];  Service: General;  Laterality: Right;   POLYPECTOMY  06/28/2022   Procedure: POLYPECTOMY;  Surgeon: San Sandor GAILS, DO;  Location: [facility];  Service: Gastroenterology;;   SALPINGOOPHORECTOMY Bilateral 03/08/2022   Procedure: OPEN SALPINGO OOPHORECTOMY;  Surgeon: Marilynn Nest, DO;  Location: [facility];  Service: Gynecology;  Laterality: Bilateral;   SHOULDER ARTHROSCOPY Right 11/06/2019   Procedure: ARTHROSCOPY RIGHT SHOULDER, DEBRIDEMENT X TWO STRUCTURES;  OPEN ROTATOR CUFF REPAIR;  Surgeon: Margrette Taft BRAVO, MD;  Location: [facility];  Service: Orthopedics;  Laterality: Right;    Social History   Socioeconomic History   Marital status: Divorced    Spouse name: Not on file   Number of children: 2   Years of education: Not on file   Highest education level: Associate degree: occupational, Scientist, product/process development, or vocational program  Occupational History   Not on file  Tobacco Use   Smoking status: Never   Smokeless tobacco: Never  Vaping Use   Vaping status: Never Used  Substance and Sexual Activity   Alcohol use: No   Drug use: Never   Sexual activity: Yes    Birth control/protection: Surgical    Comment: hysterectomy  Other Topics Concern   Not on file  Social History Narrative   Not on file   Social Drivers of Health   Financial Resource Strain: Low Risk  (12/22/2023)   Overall Financial Resource Strain (CARDIA)    Difficulty of Paying Living Expenses: Not hard at all  Food Insecurity: No Food Insecurity (12/22/2023)   Hunger Vital Sign    Worried About Running Out of Food in the Last Year: Never true    Ran Out of Food in  the Last Year: Never true  Transportation Needs: No Transportation Needs (12/22/2023)   PRAPARE - Administrator, Civil Service (Medical): No    Lack of Transportation (Non-Medical): No  Physical Activity: Inactive (12/22/2023)   Exercise Vital Sign    Days of Exercise per Week: 0 days    Minutes of Exercise per Session: Not on file  Stress: No Stress Concern Present (12/22/2023)   [employer] of Occupational Health - Occupational Stress Questionnaire    Feeling of Stress: Only a little  Social Connections: Socially Isolated (12/22/2023)   Social Connection and Isolation Panel    Frequency of Communication with Friends and Family: More than three times a week    Frequency of Social Gatherings with Friends and Family: Once a week    Attends Religious Services: Never    Database administrator or  Organizations: No    Attends Engineer, structural: Not on file    Marital Status: Divorced  Intimate Partner Violence: Not At Risk (09/09/2023)   Received from [facility]   HITS    Over the last 12 months how often did your partner physically hurt you?: Never    Over the last 12 months how often did your partner insult you or talk down to you?: Never    Over the last 12 months how often did your partner threaten you with physical harm?: Never    Over the last 12 months how often did your partner scream or curse at you?: Never    Outpatient Encounter Medications as of 02/25/2024  Medication Sig   atorvastatin  (LIPITOR) 20 MG tablet Take 1 tablet (20 mg total) by mouth daily.   colestipol  (COLESTID ) 1 g tablet Take 1 tablet (1 g total) by mouth daily.   DULoxetine  (CYMBALTA ) 60 MG capsule Take 1 capsule (60 mg total) by mouth daily.   gabapentin  (NEURONTIN ) 600 MG tablet Take 1 tablet (600 mg total) by mouth 3 (three) times daily.   hydrocortisone  1 % ointment Apply 1 Application topically 2 (two) times daily as needed for itching.   ondansetron  (ZOFRAN -ODT) 4 MG disintegrating tablet Take 1 tablet (4 mg total) by mouth every 8 (eight) hours as needed for nausea.   Probiotic Product (PROBIOTIC DAILY PO) Take 1 tablet by mouth daily. Olly   [DISCONTINUED] nitrofurantoin , macrocrystal-monohydrate, (MACROBID ) 100 MG capsule Take 1 capsule (100 mg total) by mouth every 12 (twelve) hours.   [DISCONTINUED] nitrofurantoin , macrocrystal-monohydrate, (MACROBID ) 100 MG capsule Take 1 capsule (100 mg total) by mouth every 12 (twelve) hours.   [DISCONTINUED] sulfamethoxazole -trimethoprim  (BACTRIM  DS) 800-160 MG tablet Take 1 tablet by mouth every 12 (twelve) hours.   [DISCONTINUED] tamsulosin  (FLOMAX ) 0.4 MG CAPS capsule Take 1 capsule (0.4 mg total) by mouth daily.   [DISCONTINUED] oxyCODONE -acetaminophen  (PERCOCET) 5-325 MG tablet Take 1 tablet by mouth every 6 (six) hours as needed. (Patient  not taking: Reported on 02/25/2024)   [DISCONTINUED] oxyCODONE -acetaminophen  (PERCOCET/ROXICET) 5-325 MG tablet Take 1 tablet by mouth every 6 (six) hours as needed for severe pain (pain score 7-10). (Patient not taking: Reported on 02/25/2024)   No facility-administered encounter medications on file as of 02/25/2024.    Allergies  Allergen Reactions   Gadolinium Derivatives Other (See Comments)    Pt reports she developed chest tightness, burning of her back, and weakness after MRI contrast 11/2021. Per Dr. Shoshana, he suggests 13 hr protocol prior to MRI.    Iodinated Contrast Media Nausea And Vomiting   Saline Nausea  And Vomiting    Patient states she has nausea and vomiting if she has it for a long time, she did not have any N/V after her last ESL procedure   Sodium Chloride  Nausea And Vomiting    Pertinent ROS per HPI, otherwise unremarkable      Objective:  BP 102/70   Pulse 86   Temp (!) 97.5 F (36.4 C) (Temporal)   Ht 5' 5 (1.651 m)   Wt 157 lb 12.8 oz (71.6 kg)   SpO2 99%   BMI 26.26 kg/m    Wt Readings from Last 3 Encounters:  02/25/24 157 lb 12.8 oz (71.6 kg)  01/01/24 161 lb 9.6 oz (73.3 kg)  12/26/23 161 lb 9.6 oz (73.3 kg)    Physical Exam Constitutional:      General: She is not in acute distress. HENT:     Head: Normocephalic and atraumatic.     Nose: Nose normal.     Mouth/Throat:     Mouth: Mucous membranes are moist.  Eyes:     General: No scleral icterus.    Extraocular Movements: Extraocular movements intact.     Conjunctiva/sclera: Conjunctivae normal.     Pupils: Pupils are equal, round, and reactive to light.  Cardiovascular:     Heart sounds: Normal heart sounds.     Comments: Tenderness above right breast with palpation Pulmonary:     Effort: Pulmonary effort is normal.     Breath sounds: Normal breath sounds.  Abdominal:     General: Bowel sounds are normal.     Palpations: Abdomen is soft.  Musculoskeletal:        General: Normal  range of motion.     Right lower leg: No edema.     Left lower leg: No edema.  Skin:    General: Skin is warm and dry.     Findings: No rash.  Neurological:     Mental Status: She is alert and oriented to person, place, and time.  Psychiatric:        Mood and Affect: Mood normal.        Behavior: Behavior normal.        Thought Content: Thought content normal.        Judgment: Judgment normal.    Physical Exam      Results for orders placed or performed in visit on 02/13/24  Microscopic Examination   Collection Time: 02/13/24  9:34 AM   Urine  Result Value Ref Range   WBC, UA >30 (A) 0 - 5 /hpf   RBC, Urine >30 (A) 0 - 2 /hpf   Epithelial Cells (non renal) 0-10 0 - 10 /hpf   Bacteria, UA Few (A) None seen/Few  Urinalysis, Routine w reflex microscopic   Collection Time: 02/13/24  9:34 AM  Result Value Ref Range   Specific Gravity, UA 1.025 1.005 - 1.030   pH, UA 6.0 5.0 - 7.5   Color, UA Red (A) Yellow   Appearance Ur Cloudy (A) Clear   Leukocytes,UA 2+ (A) Negative   Protein,UA 1+ (A) Negative/Trace   Glucose, UA Negative Negative   Ketones, UA Negative Negative   RBC, UA 3+ (A) Negative   Bilirubin, UA Negative Negative   Urobilinogen, Ur 1.0 0.2 - 1.0 mg/dL   Nitrite, UA Negative Negative   Microscopic Examination See below:   Urine Culture   Collection Time: 02/13/24 11:01 AM   Specimen: Urine   UR  Result Value Ref Range   Urine  Culture, Routine Final report    Organism ID, Bacteria Comment        Pertinent labs & imaging results that were available during my care of the patient were reviewed by me and considered in my medical decision making.  Assessment & Plan:  Mirabelle was seen today for chest pain.  Diagnoses and all orders for this visit:  Chest pain, pleuritic -     CT Angio Chest W/Cm &/Or Wo Cm; Future  Encounter for immunization -     Flu vaccine trivalent PF, 6mos and older(Flulaval,Afluria,Fluarix,Fluzone)     Assessment and  Plan Assessment & Plan Right-sided chest wall pain Intermittent right-sided chest wall pain, exacerbated by deep breathing and movement. Differential includes musculoskeletal pain and possible pulmonary embolism due to history of brain embolism and tumors. - Order stat CTA to rule out pulmonary embolism. - Prescribe muscle relaxer for symptomatic relief. -  Health needs: Flu vaccine administered    Continue all other maintenance medications.  Follow up plan: Return if symptoms worsen or fail to improve.   Educational handout given for PE  The above assessment and management plan was discussed with the patient. The patient verbalized understanding of and has agreed to the management plan. Patient is aware to call the clinic if they develop any new symptoms or if symptoms persist or worsen. Patient is aware when to return to the clinic for a follow-up visit. Patient educated on when it is appropriate to go to the emergency department.   Johnross Nabozny [place] Thompson, DNP [facility] [address] [place], [place] [address] [phone]

## 2024-02-26 ENCOUNTER — Other Ambulatory Visit: Payer: Self-pay | Admitting: Nurse Practitioner

## 2024-02-26 MED ORDER — TIZANIDINE HCL 2 MG PO CAPS: 2.0000 mg | ORAL_CAPSULE | Freq: Three times a day (TID) | ORAL | 0 refills | Status: DC | PRN

## 2024-03-12 ENCOUNTER — Other Ambulatory Visit: Payer: Self-pay | Admitting: Family Medicine

## 2024-03-12 ENCOUNTER — Other Ambulatory Visit: Payer: Self-pay | Admitting: Gastroenterology

## 2024-03-12 DIAGNOSIS — E785 Hyperlipidemia, unspecified: Secondary | ICD-10-CM

## 2024-03-26 ENCOUNTER — Encounter: Payer: Self-pay | Admitting: Family Medicine

## 2024-03-26 ENCOUNTER — Ambulatory Visit: Admitting: Family Medicine

## 2024-03-26 VITALS — BP 103/67 | HR 82 | Temp 97.5°F | Ht 65.0 in | Wt 157.5 lb

## 2024-03-26 DIAGNOSIS — Z789 Other specified health status: Secondary | ICD-10-CM

## 2024-03-26 DIAGNOSIS — Z23 Encounter for immunization: Secondary | ICD-10-CM | POA: Diagnosis not present

## 2024-03-26 DIAGNOSIS — R8290 Unspecified abnormal findings in urine: Secondary | ICD-10-CM | POA: Diagnosis not present

## 2024-03-26 DIAGNOSIS — E785 Hyperlipidemia, unspecified: Secondary | ICD-10-CM

## 2024-03-26 DIAGNOSIS — Z8603 Personal history of neoplasm of uncertain behavior: Secondary | ICD-10-CM

## 2024-03-26 DIAGNOSIS — R519 Headache, unspecified: Secondary | ICD-10-CM | POA: Diagnosis not present

## 2024-03-26 DIAGNOSIS — Z9889 Other specified postprocedural states: Secondary | ICD-10-CM

## 2024-03-26 DIAGNOSIS — N898 Other specified noninflammatory disorders of vagina: Secondary | ICD-10-CM | POA: Diagnosis not present

## 2024-03-26 DIAGNOSIS — Z01818 Encounter for other preprocedural examination: Secondary | ICD-10-CM

## 2024-03-26 DIAGNOSIS — Z0000 Encounter for general adult medical examination without abnormal findings: Secondary | ICD-10-CM

## 2024-03-26 DIAGNOSIS — N200 Calculus of kidney: Secondary | ICD-10-CM

## 2024-03-26 DIAGNOSIS — Z0001 Encounter for general adult medical examination with abnormal findings: Secondary | ICD-10-CM | POA: Diagnosis not present

## 2024-03-26 DIAGNOSIS — L309 Dermatitis, unspecified: Secondary | ICD-10-CM

## 2024-03-26 DIAGNOSIS — E210 Primary hyperparathyroidism: Secondary | ICD-10-CM

## 2024-03-26 LAB — MICROSCOPIC EXAMINATION
Mucus, UA: NONE SEEN
RBC, Urine: NONE SEEN /HPF (ref 0–2)
Renal Epithel, UA: NONE SEEN /HPF
Yeast, UA: NONE SEEN

## 2024-03-26 LAB — URINALYSIS, ROUTINE W REFLEX MICROSCOPIC
Glucose, UA: NEGATIVE
Nitrite, UA: NEGATIVE
RBC, UA: NEGATIVE
Specific Gravity, UA: 1.025 (ref 1.005–1.030)
Urobilinogen, Ur: 1 mg/dL (ref 0.2–1.0)
pH, UA: 5.5 (ref 5.0–7.5)

## 2024-03-26 MED ORDER — ATORVASTATIN CALCIUM 20 MG PO TABS: 20.0000 mg | ORAL_TABLET | Freq: Every day | ORAL | 4 refills | Status: AC

## 2024-03-26 MED ORDER — GABAPENTIN 600 MG PO TABS: 600.0000 mg | ORAL_TABLET | Freq: Three times a day (TID) | ORAL | 4 refills | Status: AC

## 2024-03-26 MED ORDER — HYDROCORTISONE 1 % EX OINT: 1.0000 | TOPICAL_OINTMENT | Freq: Two times a day (BID) | CUTANEOUS | 99 refills | Status: AC | PRN

## 2024-03-26 MED ORDER — DULOXETINE HCL 60 MG PO CPEP
60.0000 mg | ORAL_CAPSULE | Freq: Every day | ORAL | 3 refills | Status: AC
Start: 2024-03-26 — End: ?

## 2024-03-26 NOTE — Progress Notes (Signed)
 Laurie Kelley is a 53 y.o. female presents to office today for annual physical exam examination.    Overall she reports that she is doing well.  She saw Dr. Carrol, her plastic surgeon, and apparently he has a very well informed about fibrous dysplasia and is willing to help her with this as well as some of the reconstruction along her temporal area.  She is thrilled about it and will be undergoing a CAT scan next week with ultimate plans to have surgery performed ASAP.  Will likely need preop clearance.  She does report difficulty opening her mouth very wide.  She does not report any adverse events with sedation previously.  No chest pain, shortness of breath, difficulty swallowing reported.  She does get a little bit of an irritation at the left side of her throat that will cause her to cough but that is only intermittent  1) High Risk Cardiac Conditions  1) Recent MI - No.  2) Decompensated Heart Failure - No.  3) Unstable angina - No.  4) Symptomatic arrythmia - No.  5) Sx Valvular Disease - No.  2) Intermediate Risk Factors - DM, CKD, CVA, CHF, CAD - No.  2) Functional Status - > 4 mets (Walk, run, climb stairs) Yes.     3) Surgery Specific Risk - Intermediate (Head and Neck)    4) Further Noninvasive evaluation -   1) EKG - Yes.       2) Echo - No.   1) Worsening dyspnea   3) Stress Testing - Active Cardiac Disease - No.  5) Need for medical therapy - Beta Blocker, Statins indicated ? No.   Occupation: not working, Marital status: not married, Substance use: none Health Maintenance Due  Topic Date Due   Pneumococcal Vaccine: 50+ Years (1 of 2 - PCV) Never done   Zoster Vaccines- Shingrix (1 of 2) Never done   Hepatitis B Vaccines 19-59 Average Risk (3 of 3 - 19+ 3-dose series) 11/08/2004    Immunization History  Administered Date(s) Administered   Hepatitis B 05/11/2004, 06/09/2004   Hepatitis B, PED/ADOLESCENT 05/11/2004, 06/09/2004   Influenza, Seasonal, Injecte,  Preservative Fre 02/25/2024   Influenza,inj,Quad PF,6+ Mos 08/17/2016, 03/28/2018, 02/21/2019, 04/13/2020   Moderna SARS-COV2 Booster Vaccination 05/19/2020   Moderna Sars-Covid-2 Vaccination 07/04/2019, 08/06/2019   Tdap 08/11/2014   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   Lynch syndrome    PONV (postoperative nausea and vomiting)    Social History   Socioeconomic History   Marital status: Divorced    Spouse name: Not on file   Number of children: 2   Years of education: Not on file   Highest education level: Associate degree: occupational, Scientist, product/process development, or vocational program  Occupational History   Not on file  Tobacco Use   Smoking status: Never   Smokeless tobacco: Never  Vaping Use   Vaping status: Never Used  Substance and Sexual Activity  Alcohol use: No   Drug use: Never   Sexual activity: Yes    Birth control/protection: Surgical    Comment: hysterectomy  Other Topics Concern   Not on file  Social History Narrative   Not on file   Social Drivers of Health   Financial Resource Strain: Low Risk  (12/22/2023)   Overall Financial Resource Strain (CARDIA)    Difficulty of Paying Living Expenses: Not hard at all  Food Insecurity: No Food Insecurity (12/22/2023)   Hunger Vital Sign    Worried About Running Out of Food in the Last Year: Never true    Ran Out of Food in the Last Year: Never true  Transportation Needs: No Transportation Needs (12/22/2023)   PRAPARE - Administrator, Civil Service (Medical): No    Lack  of Transportation (Non-Medical): No  Physical Activity: Inactive (12/22/2023)   Exercise Vital Sign    Days of Exercise per Week: 0 days    Minutes of Exercise per Session: Not on file  Stress: No Stress Concern Present (12/22/2023)   [employer] of Occupational Health - Occupational Stress Questionnaire    Feeling of Stress: Only a little  Social Connections: Socially Isolated (12/22/2023)   Social Connection and Isolation Panel    Frequency of Communication with Friends and Family: More than three times a week    Frequency of Social Gatherings with Friends and Family: Once a week    Attends Religious Services: Never    Database administrator or Organizations: No    Attends Engineer, structural: Not on file    Marital Status: Divorced  Intimate Partner Violence: Not At Risk (09/09/2023)   Received from [facility]   HITS    Over the last 12 months how often did your partner physically hurt you?: Never    Over the last 12 months how often did your partner insult you or talk down to you?: Never    Over the last 12 months how often did your partner threaten you with physical harm?: Never    Over the last 12 months how often did your partner scream or curse at you?: Never   Past Surgical History:  Procedure Laterality Date   ABDOMINAL HYSTERECTOMY N/A 03/08/2022   Procedure: HYSTERECTOMY ABDOMINAL;  Surgeon: Marilynn Nest, DO;  Location: [facility];  Service: Gynecology;  Laterality: N/A;   ADJACENT TISSUE TRANSFER/TISSUE REARRANGEMENT Right 11/22/2022   Procedure: Fat grafting to right  temple from right buttocks after brain surgery;  Surgeon: Lowery Estefana RAMAN, DO;  Location: [facility];  Service: Plastics;  Laterality: Right;   APPLICATION OF CRANIAL NAVIGATION Left 02/18/2021   Procedure: APPLICATION OF CRANIAL NAVIGATION;  Surgeon: Cheryle Debby LABOR, MD;  Location: [facility];  Service: Neurosurgery;  Laterality: Left;   APPLICATION OF CRANIAL NAVIGATION N/A  05/18/2021   Procedure: APPLICATION OF CRANIAL NAVIGATION;  Surgeon: Cheryle Debby LABOR, MD;  Location: [facility];  Service: Neurosurgery;  Laterality: N/A;   BIOPSY  06/28/2022   Procedure: BIOPSY;  Surgeon: San Sandor GAILS, DO;  Location: [facility];  Service: Gastroenterology;;   BRAIN SURGERY     CHOLECYSTECTOMY  1993   COLONOSCOPY WITH PROPOFOL  N/A 06/28/2022   Procedure: COLONOSCOPY WITH PROPOFOL ;  Surgeon: San Sandor GAILS, DO;  Location: [facility];  Service: Gastroenterology;  Laterality: N/A;   CRANIOTOMY Left 02/18/2021   Procedure: Left orbitozygomatic craniotomy for tumor resection  with Brain Lab;  Surgeon: Cheryle Debby LABOR, MD;  Location:  [facility];  Service: Neurosurgery;  Laterality: Left;   CRANIOTOMY Right 05/18/2021   Procedure: Right Orbitozygomatic craniotomy for tumor with brainlab;  Surgeon: Cheryle Debby LABOR, MD;  Location: [facility];  Service: Neurosurgery;  Laterality: Right;   ESOPHAGOGASTRODUODENOSCOPY (EGD) WITH PROPOFOL  N/A 06/28/2022   Procedure: ESOPHAGOGASTRODUODENOSCOPY (EGD) WITH PROPOFOL ;  Surgeon: San Sandor GAILS, DO;  Location: [facility];  Service: Gastroenterology;  Laterality: N/A;   EXTRACORPOREAL SHOCK WAVE LITHOTRIPSY Right 12/04/2023   Procedure: LITHOTRIPSY, ESWL;  Surgeon: Sherrilee Belvie CROME, MD;  Location: [facility];  Service: Urology;  Laterality: Right;   EXTRACORPOREAL SHOCK WAVE LITHOTRIPSY Left 01/01/2024   Procedure: LITHOTRIPSY, ESWL;  Surgeon: Sherrilee Belvie CROME, MD;  Location: [facility];  Service: Urology;  Laterality: Left;   PARATHYROIDECTOMY Right 04/23/2023   Procedure: RIGHT INFERIOR PARATHYROIDECTOMY;  Surgeon: Eletha Boas, MD;  Location: [facility];  Service: General;  Laterality: Right;   POLYPECTOMY  06/28/2022   Procedure: POLYPECTOMY;  Surgeon: San Sandor GAILS, DO;  Location: [facility];  Service: Gastroenterology;;   SALPINGOOPHORECTOMY Bilateral 03/08/2022   Procedure: OPEN SALPINGO OOPHORECTOMY;  Surgeon: Marilynn Nest, DO;   Location: [facility];  Service: Gynecology;  Laterality: Bilateral;   SHOULDER ARTHROSCOPY Right 11/06/2019   Procedure: ARTHROSCOPY RIGHT SHOULDER, DEBRIDEMENT X TWO STRUCTURES; OPEN ROTATOR CUFF REPAIR;  Surgeon: Margrette Taft BRAVO, MD;  Location: [facility];  Service: Orthopedics;  Laterality: Right;   Family History  Problem Relation Age of Onset   Cancer Mother    Hyperlipidemia Mother    Lung cancer Mother    Hypertension Father    Heart disease Father    Other Sister        meningiomas/tumors x11   Other Daughter        Lynch syndrome   Brain cancer Maternal Aunt        d. 47s-60s   Lung cancer Maternal Aunt    Bone cancer Maternal Aunt        d. 50s-60s   Liver cancer Paternal Uncle    Lung cancer Paternal Grandmother        d.85   Rectal cancer Neg Hx    Esophageal cancer Neg Hx    Stomach cancer Neg Hx    Breast cancer Neg Hx    Colon polyps Neg Hx    Colon cancer Neg Hx     Current Outpatient Medications:    colestipol  (COLESTID ) 1 g tablet, TAKE 1 TABLET BY MOUTH DAILY., Disp: 90 tablet, Rfl: 1   DULoxetine  (CYMBALTA ) 60 MG capsule, Take 1 capsule (60 mg total) by mouth daily., Disp: 90 capsule, Rfl: 3   gabapentin  (NEURONTIN ) 600 MG tablet, Take 1 tablet (600 mg total) by mouth 3 (three) times daily., Disp: 270 tablet, Rfl: 4   hydrocortisone  1 % ointment, Apply 1 Application topically 2 (two) times daily as needed for itching., Disp: , Rfl:    atorvastatin  (LIPITOR) 20 MG tablet, Take 1 tablet (20 mg total) by mouth daily., Disp: 90 tablet, Rfl: 4   ondansetron  (ZOFRAN -ODT) 4 MG disintegrating tablet, Take 1 tablet (4 mg total) by mouth every 8 (eight) hours as needed for nausea., Disp: 30 tablet, Rfl: 0   Probiotic Product (PROBIOTIC DAILY PO), Take 1 tablet by mouth daily. Olly, Disp: , Rfl:    tizanidine  (ZANAFLEX ) 2 MG capsule, Take 1 capsule (2 mg total) by mouth 3 (three) times daily as needed for muscle spasms., Disp: 30 capsule, Rfl: 0  Allergies  Allergen  Reactions   Gadolinium  Derivatives Other (See Comments)    Pt reports she developed chest tightness, burning of her back, and weakness after MRI contrast 11/2021. Per Dr. Shoshana, he suggests 13 hr protocol prior to MRI.    Iodinated Contrast Media Nausea And Vomiting   Saline Nausea And Vomiting    Patient states she has nausea and vomiting if she has it for a long time, she did not have any N/V after her last ESL procedure   Sodium Chloride  Nausea And Vomiting     ROS: Review of Systems Pertinent items noted in HPI and remainder of comprehensive ROS otherwise negative.    Physical exam BP 103/67   Pulse 82   Temp (!) 97.5 F (36.4 C)   Ht 5' 5 (1.651 m)   Wt 157 lb 8 oz (71.4 kg)   SpO2 99%   BMI 26.21 kg/m  General appearance: alert, cooperative, appears stated age, and no distress Head: She has right sided temporal atrophy status postsurgical intervention Eyes: PERRLA, EOMI.  Sclera white Ears: Right TM with blue tympanostomy tube that appears to be falling out.  No drainage or erythema.  Left TM with normal light reflex Nose: Nares normal. Septum midline. Mucosa normal. No drainage or sinus tenderness. Throat: Only able to open her mouth about 2 fingerbreadths wide.  Mucous membranes appear moist without any oropharyngeal lesions with limited view Neck: no adenopathy, no carotid bruit, no JVD, supple, symmetrical, trachea midline, and thyroid  not enlarged, symmetric, no tenderness/mass/nodules Back: symmetric, no curvature. ROM normal. No CVA tenderness. Lungs: clear to auscultation bilaterally Heart: regular rate and rhythm, S1, S2 normal, no murmur, click, rub or gallop Abdomen: Mild tenderness to palpation in the right upper and lower and left lower quadrants.  No palpable masses.  No rebound or guarding. Extremities: extremities normal, atraumatic, no cyanosis or edema Pulses: 2+ and symmetric Skin: Skin color, texture, turgor normal. No rashes or lesions Lymph nodes:  Cervical, supraclavicular, and axillary nodes normal. Neurologic: Grossly normal      03/26/2024    1:54 PM 12/26/2023    2:26 PM 08/27/2023   10:59 AM  Depression screen PHQ 2/9  Decreased Interest 0 0 1  Down, Depressed, Hopeless 0 0   PHQ - 2 Score 0 0 1  Altered sleeping 0 0   Tired, decreased energy 0 1   Change in appetite 0 1   Feeling bad or failure about yourself  0 0   Trouble concentrating 0 0   Moving slowly or fidgety/restless 0 1   Suicidal thoughts 0 0   PHQ-9 Score 0 3   Difficult doing work/chores Not difficult at all Extremely dIfficult       03/26/2024    1:54 PM 12/26/2023    2:26 PM 08/27/2023   10:59 AM 07/06/2023    2:21 PM  GAD 7 : Generalized Anxiety Score  Nervous, Anxious, on Edge 0 0 1 1  Control/stop worrying 0 1 1 1   Worry too much - different things 0 0 1 1  Trouble relaxing 0 0 1 1  Restless 0 0 1 1  Easily annoyed or irritable 0 0 0 0  Afraid - awful might happen 0 0 1 2  Total GAD 7 Score 0 1 6 7   Anxiety Difficulty Not difficult at all Extremely difficult Very difficult Very difficult    Assessment/ Plan: Laurie Kelley here for annual physical exam.   Annual physical exam  Preop examination - Plan: EKG 12-Lead, CBC with  Differential, Protime-INR  History of resection of meningioma - Plan: DULoxetine  (CYMBALTA ) 60 MG capsule, gabapentin  (NEURONTIN ) 600 MG tablet  Right sided facial pain - Plan: DULoxetine  (CYMBALTA ) 60 MG capsule, gabapentin  (NEURONTIN ) 600 MG tablet  Abnormal urine odor - Plan: Urinalysis, Routine w reflex microscopic, Urine Culture, CMP14+EGFR  Nephrolithiasis - Plan: CMP14+EGFR, CBC with Differential  Vaginal irritation - Plan: Urinalysis, Routine w reflex microscopic, Urine Culture  Hyperlipidemia with target LDL less than 100 - Plan: Lipid Panel, CMP14+EGFR, atorvastatin  (LIPITOR) 20 MG tablet, EKG 12-Lead  Hyperparathyroidism, primary - Plan: CMP14+EGFR, VITAMIN D  25 Hydroxy (Vit-D Deficiency, Fractures),  TSH  Hepatitis B vaccination status unknown - Plan: Hepatitis B surface antibody,quantitative  Immunization due - Plan: Pneumococcal conjugate vaccine 20-valent (Prevnar 20)  Dermatitis - Plan: hydrocortisone  1 % ointment   Fasting labs obtained.  Medications have been renewed.  Urinalysis demonstrated no evidence of urinary tract infection but did have some sediment and calcium  oxalate crystals.  I given her handout on ways to prevent kidney stones.  She should continue hydration.  Discussed use of lemons or other citrus fruits  EKG obtained in anticipation for surgery in a couple of months  Will check vitamin D  level, calcium  level given history of primary hyperparathyroidism status post treatment  Incomplete hepatitis B vaccines so we will look for evidence of immunity to determine if need for ongoing vaccination  Pneumococcal vaccination administered today  I have independently evaluated patient.  Laurie Kelley is a 53 y.o. female who is low risk for a intermediate risk surgery.  There are not modifiable risk factors (smoking, etc). Laurie Kelley's RCRI calculation for MACE is: 0.   Counseled on healthy lifestyle choices, including diet (rich in fruits, vegetables and lean meats and low in salt and simple carbohydrates) and exercise (at least 30 minutes of moderate physical activity daily).  Patient to follow up 4-35m  Shamell Suarez M. Jolinda, DO

## 2024-03-26 NOTE — Patient Instructions (Signed)

## 2024-03-27 LAB — CBC WITH DIFFERENTIAL/PLATELET
Basophils Absolute: 0 x10E3/uL (ref 0.0–0.2)
Basos: 1 %
EOS (ABSOLUTE): 0 x10E3/uL (ref 0.0–0.4)
Eos: 1 %
Hematocrit: 44.6 % (ref 34.0–46.6)
Hemoglobin: 14.3 g/dL (ref 11.1–15.9)
Immature Grans (Abs): 0 x10E3/uL (ref 0.0–0.1)
Immature Granulocytes: 0 %
Lymphocytes Absolute: 1.5 x10E3/uL (ref 0.7–3.1)
Lymphs: 30 %
MCH: 28.8 pg (ref 26.6–33.0)
MCHC: 32.1 g/dL (ref 31.5–35.7)
MCV: 90 fL (ref 79–97)
Monocytes Absolute: 0.3 x10E3/uL (ref 0.1–0.9)
Monocytes: 6 %
Neutrophils Absolute: 3.1 x10E3/uL (ref 1.4–7.0)
Neutrophils: 62 %
Platelets: 290 x10E3/uL (ref 150–450)
RBC: 4.96 x10E6/uL (ref 3.77–5.28)
RDW: 13.8 % (ref 11.7–15.4)
WBC: 4.9 x10E3/uL (ref 3.4–10.8)

## 2024-03-27 LAB — CMP14+EGFR
ALT: 24 IU/L (ref 0–32)
AST: 20 IU/L (ref 0–40)
Albumin: 4.8 g/dL (ref 3.8–4.9)
Alkaline Phosphatase: 134 IU/L (ref 49–135)
BUN/Creatinine Ratio: 15 (ref 9–23)
BUN: 16 mg/dL (ref 6–24)
Bilirubin Total: 0.5 mg/dL (ref 0.0–1.2)
CO2: 21 mmol/L (ref 20–29)
Calcium: 10.7 mg/dL — ABNORMAL HIGH (ref 8.7–10.2)
Chloride: 103 mmol/L (ref 96–106)
Creatinine, Ser: 1.08 mg/dL — ABNORMAL HIGH (ref 0.57–1.00)
Globulin, Total: 1.9 g/dL (ref 1.5–4.5)
Glucose: 85 mg/dL (ref 70–99)
Potassium: 4.8 mmol/L (ref 3.5–5.2)
Sodium: 139 mmol/L (ref 134–144)
Total Protein: 6.7 g/dL (ref 6.0–8.5)
eGFR: 62 mL/min/1.73 (ref >59)

## 2024-03-27 LAB — PROTIME-INR
INR: 1 (ref 0.9–1.2)
Prothrombin Time: 10.9 s (ref 9.1–12.0)

## 2024-03-27 LAB — LIPID PANEL
Chol/HDL Ratio: 3.5 ratio (ref 0.0–4.4)
Cholesterol, Total: 167 mg/dL (ref 100–199)
HDL: 48 mg/dL (ref >39)
LDL Chol Calc (NIH): 104 mg/dL — ABNORMAL HIGH (ref 0–99)
Triglycerides: 80 mg/dL (ref 0–149)
VLDL Cholesterol Cal: 15 mg/dL (ref 5–40)

## 2024-03-27 LAB — TSH: TSH: 1.16 u[IU]/mL (ref 0.450–4.500)

## 2024-03-27 LAB — VITAMIN D 25 HYDROXY (VIT D DEFICIENCY, FRACTURES): Vit D, 25-Hydroxy: 43 ng/mL (ref 30.0–100.0)

## 2024-03-27 LAB — HEPATITIS B SURFACE ANTIBODY, QUANTITATIVE: Hepatitis B Surf Ab Quant: <3.5 m[IU]/mL — ABNORMAL LOW

## 2024-03-28 ENCOUNTER — Encounter: Payer: Self-pay | Admitting: Orthopedic Surgery

## 2024-03-28 ENCOUNTER — Other Ambulatory Visit (INDEPENDENT_AMBULATORY_CARE_PROVIDER_SITE_OTHER): Payer: Self-pay

## 2024-03-28 ENCOUNTER — Ambulatory Visit: Payer: Self-pay | Admitting: Family Medicine

## 2024-03-28 ENCOUNTER — Ambulatory Visit (INDEPENDENT_AMBULATORY_CARE_PROVIDER_SITE_OTHER): Admitting: Orthopedic Surgery

## 2024-03-28 ENCOUNTER — Other Ambulatory Visit: Payer: Self-pay

## 2024-03-28 DIAGNOSIS — G8929 Other chronic pain: Secondary | ICD-10-CM

## 2024-03-28 DIAGNOSIS — M7521 Bicipital tendinitis, right shoulder: Secondary | ICD-10-CM

## 2024-03-28 DIAGNOSIS — M25511 Pain in right shoulder: Secondary | ICD-10-CM | POA: Diagnosis not present

## 2024-03-28 DIAGNOSIS — M25512 Pain in left shoulder: Secondary | ICD-10-CM | POA: Diagnosis not present

## 2024-03-28 LAB — URINE CULTURE

## 2024-03-28 MED ORDER — MELOXICAM 7.5 MG PO TABS: 7.5000 mg | ORAL_TABLET | Freq: Every day | ORAL | 5 refills | Status: AC

## 2024-03-28 NOTE — Progress Notes (Signed)
    03/28/2024   Chief Complaint  Patient presents with   Shoulder Pain    Bilateral shoulder pain left shoulder since one year. Came in jan/feb and had injection both shoulders.   Right shoulder hx of surgery in 2021    No diagnosis found.  What pharmacy do you use ? [facility]_________  DOI/DOS/ Date: 1 year ago

## 2024-03-28 NOTE — Progress Notes (Signed)
    03/28/2024   Chief Complaint  Patient presents with   Shoulder Pain    Bilateral shoulder pain left shoulder since one year. Came in jan/feb and had injection both shoulders.   Right shoulder hx of surgery in 2021    Encounter Diagnoses  Name Primary?   Chronic pain of both shoulders    Biceps tendonitis of both shoulders Yes    What pharmacy do you use ? [facility]_________  DOI/DOS/ Date: 1 year ago     Chief Complaint  Patient presents with   Shoulder Pain    Bilateral shoulder pain left shoulder since one year. Came in jan/feb and had injection both shoulders.   Right shoulder hx of surgery in 2021    Jolinda Potter M, DO  Shoulder Pain    53 year old female status post right shoulder surgery 2021: Excision of calcific tendinitis and rotator cuff repair of defect.  Presents now with bilateral shoulder pain which is best described as burning sensation in the front of her arms.  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  She is having difficulty getting this managed as the surgeons have declined to do further surgery  As far as her shoulders go the symptoms are intermittent and not constant but seem to bother her enough to warrant treatment  Physical Exam Musculoskeletal:     Comments: Right and left shoulder similar evaluation she has tenderness over the biceps tendon proximally with normal range of motion stability and strength in each shoulder there are no skin issues at this time neurovascular exam is intact      DG Shoulder Right Result Date: 03/28/2024 Shoulder right Multiple views Burning pain anterior aspect right shoulder No evidence of bone lesions in the chest wall scapular humerus.  Shoulder looks normal Impression normal  right shoulder x-ray   DG Shoulder Left Result Date: 03/28/2024 Shoulder left Multiple views of the left shoulder to evaluate anterior left shoulder burning and pain Shoulder x-ray normal anatomy no evidence of fibrous dysplasia      Encounter Diagnoses  Name Primary?   Chronic pain of both shoulders    Biceps tendonitis of both shoulders Yes   Assessment  Unclear etiology of anterior shoulder pain recommend anti-inflammatories for 1 month if no improvement attempt injection biceps tendon  Use ultrasound if possible Plan   Meds ordered this encounter  Medications   meloxicam  (MOBIC ) 7.5 MG tablet    Sig: Take 1 tablet (7.5 mg total) by mouth daily.    Dispense:  30 tablet    Refill:  5

## 2024-04-16 ENCOUNTER — Telehealth: Payer: Self-pay | Admitting: Gastroenterology

## 2024-04-16 NOTE — Telephone Encounter (Signed)
 Inbound call from patient requesting to be scheduled for anything next week due to her internal hemorrhoid now protruding from her rectum. Patient is requesting a call back. Please advise.

## 2024-04-17 NOTE — Telephone Encounter (Signed)
 Pt has seen [facility] in the past. Scheduled pt to see [facility] 04/22/24@3pm , pt aware of appt.

## 2024-04-21 ENCOUNTER — Encounter: Payer: Self-pay | Admitting: Orthopedic Surgery

## 2024-04-22 ENCOUNTER — Ambulatory Visit: Admitting: Gastroenterology

## 2024-05-05 ENCOUNTER — Ambulatory Visit: Admitting: Orthopedic Surgery

## 2024-05-05 ENCOUNTER — Encounter: Payer: Self-pay | Admitting: Gastroenterology

## 2024-05-05 ENCOUNTER — Encounter: Payer: Self-pay | Admitting: Orthopedic Surgery

## 2024-05-05 ENCOUNTER — Ambulatory Visit: Admitting: Gastroenterology

## 2024-05-05 VITALS — BP 108/72 | HR 72 | Ht 65.0 in | Wt 168.1 lb

## 2024-05-05 DIAGNOSIS — M7522 Bicipital tendinitis, left shoulder: Secondary | ICD-10-CM | POA: Diagnosis not present

## 2024-05-05 DIAGNOSIS — K915 Postcholecystectomy syndrome: Secondary | ICD-10-CM

## 2024-05-05 DIAGNOSIS — Z15060 Genetic susceptibility to colorectal cancer: Secondary | ICD-10-CM | POA: Diagnosis not present

## 2024-05-05 DIAGNOSIS — K529 Noninfective gastroenteritis and colitis, unspecified: Secondary | ICD-10-CM | POA: Diagnosis not present

## 2024-05-05 DIAGNOSIS — Z1509 Genetic susceptibility to other malignant neoplasm: Secondary | ICD-10-CM

## 2024-05-05 DIAGNOSIS — M7521 Bicipital tendinitis, right shoulder: Secondary | ICD-10-CM

## 2024-05-05 DIAGNOSIS — D496 Neoplasm of unspecified behavior of brain: Secondary | ICD-10-CM | POA: Diagnosis not present

## 2024-05-05 DIAGNOSIS — K649 Unspecified hemorrhoids: Secondary | ICD-10-CM

## 2024-05-05 DIAGNOSIS — Z15068 Genetic susceptibility to other malignant neoplasm of digestive system: Secondary | ICD-10-CM

## 2024-05-05 DIAGNOSIS — Z1507 Genetic susceptibility to malignant neoplasm of urinary tract: Secondary | ICD-10-CM

## 2024-05-05 MED ORDER — HYDROCORTISONE (PERIANAL) 2.5 % EX CREA: 1.0000 | TOPICAL_CREAM | Freq: Two times a day (BID) | CUTANEOUS | 1 refills | Status: AC

## 2024-05-05 MED ORDER — NA SULFATE-K SULFATE-MG SULF 17.5-3.13-1.6 GM/177ML PO SOLN: 1.0000 | Freq: Once | ORAL | 0 refills | Status: AC

## 2024-05-05 MED ORDER — COLESTIPOL HCL 1 G PO TABS: 2.0000 g | ORAL_TABLET | Freq: Every day | ORAL | 3 refills | Status: AC

## 2024-05-05 NOTE — Patient Instructions (Signed)
 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) 07/31/2022   Hearing loss 07/26/2022   Diverticulosis of colon without hemorrhage 06/28/2022   Polyp of sigmoid colon 06/28/2022   Facial asymmetry 03/07/2022   History of resection of meningioma 12/16/2021   Osteopenia of neck of right femur 12/06/2021   Conductive hearing loss, bilateral 09/23/2021   ETD (Eustachian tube dysfunction), bilateral 09/23/2021   Right chronic serous otitis media 09/23/2021   Genetic testing 08/02/2021   PMS2-related Lynch syndrome (HNPCC4) 08/02/2021   Family history of brain tumor 06/27/2021   Status post craniotomy 05/18/2021   Brain tumor (HCC) 02/18/2021   Skull mass 01/21/2021   S/P arthroscopy of right shoulder for debridement of calcific tendonitis and open rotator cuff repair  11/06/19 11/11/2019   Nontraumatic incomplete tear of right rotator cuff    Myalgia due to statin 12/23/2018   Pain in joint of left shoulder 07/17/2018   Low HDL (under 40) 08/01/2017   Mixed hyperlipidemia 08/18/2015   Internal hemorrhoids 08/18/2014   Esophageal reflux 08/18/2014

## 2024-05-05 NOTE — Progress Notes (Signed)
 FOLLOW-UP OFFICE VISIT   Patient: Laurie Kelley           Date of Birth: [birth year]           MRN: [id number] Visit Date: 05/05/2024 Requested by: [name], DO [address] [place],  [place] [address] PCP: [name], DO    Encounter Diagnosis  Name Primary?   Biceps tendonitis of both shoulders Yes    Chief Complaint  Patient presents with   Shoulder Pain    Both / feeling better   53 year old female suspected biceps tendinitis treated with meloxicam   Here for follow-up indicating both shoulders are feeling better  Reexamination she does have tenderness over the front of the shoulder she has normal forward elevation of the shoulder and normal speeds test  ASSESSMENT AND PLAN Improved biceps tendinitis take meloxicam  on an as needed basis.

## 2024-05-05 NOTE — Progress Notes (Signed)
 Chief Complaint: Hemorrhoids Primary GI MD: Dr. 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   Discussed the use of AI scribe software for clinical note transcription with the patient, who gave verbal consent to proceed.  She experiences significant irritation from internal hemorrhoids, which were particularly bothersome around November 4th. The irritation is severe but without bleeding. Although the symptoms have improved slightly, they remain present.  She is currently taking colestipol , one pill daily at 9 AM, which initially helped reduce the frequency of bowel movements occurring with urination. However, she continues to experience soft and diarrhea-like stools, with no solid stools for an extended period. She notes that every time she urinates, she also has a bowel movement. No constipation is reported despite the stool consistency.  She has a history of chronic pain managed with gabapentin  and Cymbalta . She previously used oxycodone  for kidney stone pain following a procedure. 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 She has undergone two craniotomies for tumor removal and continues to experience chronic head pain.  She is due for a colonoscopy in January due to Lynch syndrome, which requires her to have the procedure in a hospital setting. She also experiences daily phlegm production, particularly in the mornings,  with varying colors including brown, bloody, green, and white.  She cares for her 40-month-old grandson and has a 34 year old grandson, indicating an active role in her family life.   PREVIOUS GI WORKUP   Colonoscopy 06/28/2022 - Two 2 to 3 mm polyps in the sigmoid colon, removed with a cold snare. Resected and retrieved.  - Diverticulosis in the sigmoid colon and in the descending colon.  - The distal rectum and anal verge are normal on retroflexion view.  - The examined portion of the ileum was normal. -Repeat 2 years (06/2024)   EGD 06/28/2022 - Normal esophagus.  - Normal stomach. Biopsied.  - Normal examined duodenum. -Repeat 2 to 4 years   A. 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   Lynch syndrome    PONV (postoperative nausea and vomiting)     Past Surgical History:  Procedure Laterality Date   ABDOMINAL HYSTERECTOMY N/A 03/08/2022   Procedure: HYSTERECTOMY ABDOMINAL;  Surgeon: Ozan, Jennifer, DO;  Location: [facility];  Service: Gynecology;  Laterality: N/A;   ADJACENT TISSUE TRANSFER/TISSUE REARRANGEMENT Right 11/22/2022   Procedure: Fat grafting to right  temple from right buttocks after brain surgery;  Surgeon: Lowery Estefana RAMAN, DO;  Location: [facility];  Service: Plastics;  Laterality: Right;   APPLICATION OF CRANIAL NAVIGATION Left 02/18/2021   Procedure: APPLICATION OF CRANIAL NAVIGATION;  Surgeon: Cheryle Debby LABOR, MD;  Location: [facility];  Service: Neurosurgery;  Laterality: Left;   APPLICATION OF CRANIAL NAVIGATION N/A 05/18/2021   Procedure: APPLICATION OF CRANIAL NAVIGATION;  Surgeon: Cheryle Debby LABOR, MD;  Location: [facility];  Service: Neurosurgery;  Laterality: N/A;   BIOPSY  06/28/2022   Procedure: BIOPSY;  Surgeon: San Sandor GAILS, DO;  Location: [facility];  Service: Gastroenterology;;   BRAIN SURGERY     CHOLECYSTECTOMY  1993   COLONOSCOPY WITH PROPOFOL  N/A 06/28/2022   Procedure: COLONOSCOPY WITH PROPOFOL ;  Surgeon: San Sandor GAILS, DO;  Location: [facility];  Service: Gastroenterology;  Laterality: N/A;   CRANIOTOMY Left 02/18/2021   Procedure: Left orbitozygomatic craniotomy for tumor resection  with Brain Lab;  Surgeon: Cheryle Debby LABOR, MD;  Location: [facility];  Service: Neurosurgery;  Laterality: Left;   CRANIOTOMY Right 05/18/2021   Procedure: Right Orbitozygomatic craniotomy for tumor with brainlab;  Surgeon: Cheryle Debby LABOR, MD;  Location: [facility];  Service: Neurosurgery;  Laterality: Right;   ESOPHAGOGASTRODUODENOSCOPY (EGD) WITH PROPOFOL  N/A 06/28/2022   Procedure: ESOPHAGOGASTRODUODENOSCOPY (EGD) WITH PROPOFOL ;  Surgeon: San Sandor GAILS, DO;  Location: [facility];  Service: Gastroenterology;  Laterality: N/A;   EXTRACORPOREAL SHOCK WAVE LITHOTRIPSY Right 12/04/2023   Procedure: LITHOTRIPSY, ESWL;  Surgeon: Sherrilee Belvie CROME, MD;  Location: [facility];  Service: Urology;  Laterality: Right;   EXTRACORPOREAL SHOCK WAVE LITHOTRIPSY Left 01/01/2024   Procedure: LITHOTRIPSY, ESWL;  Surgeon: Sherrilee Belvie CROME, MD;  Location: [facility];  Service: Urology;  Laterality: Left;   PARATHYROIDECTOMY Right 04/23/2023   Procedure: RIGHT INFERIOR PARATHYROIDECTOMY;  Surgeon: Eletha Boas, MD;  Location: [facility];   Service: General;  Laterality: Right;   POLYPECTOMY  06/28/2022   Procedure: POLYPECTOMY;  Surgeon: San Sandor GAILS, DO;  Location: [facility];  Service: Gastroenterology;;   SALPINGOOPHORECTOMY Bilateral 03/08/2022   Procedure: OPEN SALPINGO OOPHORECTOMY;  Surgeon: Marilynn Nest, DO;  Location: [facility];  Service: Gynecology;  Laterality: Bilateral;   SHOULDER ARTHROSCOPY Right 11/06/2019   Procedure: ARTHROSCOPY RIGHT SHOULDER, DEBRIDEMENT X TWO STRUCTURES; OPEN ROTATOR CUFF REPAIR;  Surgeon: Margrette Taft BRAVO, MD;  Location: [facility];  Service: Orthopedics;  Laterality: Right;    Current Outpatient Medications  Medication Sig Dispense Refill   Na Sulfate-K Sulfate-Mg Sulfate concentrate (SUPREP) 17.5-3.13-1.6 GM/177ML SOLN Take 1 kit (354 mLs total) by mouth once for 1 dose. 354 mL 0   atorvastatin  (LIPITOR) 20 MG tablet Take 1 tablet (20 mg total) by mouth daily. 90 tablet 4   colestipol  (COLESTID ) 1 g tablet Take 2 tablets (2 g total) by mouth daily. 60 tablet 3   DULoxetine  (CYMBALTA ) 60 MG capsule Take 1 capsule (60 mg total) by mouth daily. 90 capsule 3   gabapentin  (NEURONTIN ) 600 MG tablet Take 1 tablet (600 mg total) by mouth 3 (three) times daily. 270 tablet 4   hydrocortisone  1 % ointment Apply 1 Application topically 2 (two) times daily as needed for itching. 30 g PRN   meloxicam  (MOBIC ) 7.5 MG tablet Take 1 tablet (7.5 mg total) by mouth daily. 30 tablet  5   Probiotic Product (PROBIOTIC DAILY PO) Take 1 tablet by mouth daily. Olly     No current facility-administered medications for this visit.    Allergies as of 05/05/2024 - Review Complete 05/05/2024  Allergen Reaction Noted   Gadolinium derivatives Other (See Comments) 12/19/2021   Iodinated contrast media Nausea And Vomiting 11/25/2023   Saline Nausea And Vomiting 08/22/2021   Sodium chloride  Nausea And Vomiting 08/22/2021    Family History  Problem Relation Age of Onset   Cancer Mother    Hyperlipidemia Mother     Lung cancer Mother    Prostate cancer Father    Hypertension Father    Heart disease Father    Other Sister        meningiomas/tumors x11   Brain cancer Maternal Aunt        d. 5s-60s   Lung cancer Maternal Aunt    Bone cancer Maternal Aunt        d. 50s-60s   Stomach cancer Paternal Uncle    Liver cancer Paternal Uncle    Lung cancer Paternal Grandmother        d.85   Other Daughter        Lynch syndrome   Rectal cancer Neg Hx    Esophageal cancer Neg Hx    Breast cancer Neg Hx    Colon polyps Neg Hx    Colon cancer Neg Hx    Pancreatic cancer Neg Hx     Social History   Socioeconomic History   Marital status: Divorced    Spouse name: Not on file   Number of children: 2   Years of education: Not on file   Highest education level: Associate degree: occupational, scientist, product/process development, or vocational program  Occupational History   Not on file  Tobacco Use   Smoking status: Never   Smokeless tobacco: Never  Vaping Use   Vaping status: Never Used  Substance and Sexual Activity   Alcohol use: No   Drug use: Never   Sexual activity: Yes    Birth control/protection: Surgical    Comment: hysterectomy  Other Topics Concern   Not on file  Social History Narrative   Not on file   Social Drivers of Health   Financial Resource Strain: Low Risk  (12/22/2023)   Overall Financial Resource Strain (CARDIA)    Difficulty of Paying Living Expenses: Not hard at all  Food Insecurity: No Food Insecurity (12/22/2023)   Hunger Vital Sign    Worried About Running Out of Food in the Last Year: Never true    Ran Out of Food in the Last Year: Never true  Transportation Needs: No Transportation Needs (12/22/2023)   PRAPARE - Administrator, Civil Service (Medical): No    Lack of Transportation (Non-Medical): No  Physical Activity: Inactive (12/22/2023)   Exercise Vital Sign    Days of Exercise per Week: 0 days    Minutes of Exercise per Session: Not on file  Stress: No Stress  Concern Present (12/22/2023)   [employer] of Occupational Health - Occupational Stress Questionnaire    Feeling of Stress: Only a little  Social Connections: Socially Isolated (12/22/2023)   Social Connection and Isolation Panel    Frequency of Communication with Friends and Family: More than three times a week    Frequency of Social Gatherings with Friends and Family: Once a week    Attends Religious Services: Never    Database Administrator or Organizations: No  Attends Banker Meetings: Not on file    Marital Status: Divorced  Intimate Partner Violence: Not At Risk (09/09/2023)   Received from [facility]   HITS    Over the last 12 months how often did your partner physically hurt you?: Never    Over the last 12 months how often did your partner insult you or talk down to you?: Never    Over the last 12 months how often did your partner threaten you with physical harm?: Never    Over the last 12 months how often did your partner scream or curse at you?: Never    Review of Systems:    Constitutional: No weight loss, fever, chills, weakness or fatigue HEENT: Eyes: No change in vision               Ears, Nose, Throat:  No change in hearing or congestion Skin: No rash or itching Cardiovascular: No chest pain, chest pressure or palpitations   Respiratory: No SOB or cough Gastrointestinal: See HPI and otherwise negative Genitourinary: No dysuria or change in urinary frequency Neurological: No headache, dizziness or syncope Musculoskeletal: No new muscle or joint pain Hematologic: No bleeding or bruising Psychiatric: No history of depression or anxiety    Physical Exam:  Vital signs: BP 108/72   Pulse 72   Ht 5' 5 (1.651 m)   Wt 168 lb 2 oz (76.3 kg)   SpO2 99%   BMI 27.98 kg/m   Constitutional: NAD, alert and cooperative Head:  Normocephalic and atraumatic. Eyes:   PEERL, EOMI. No icterus. Conjunctiva pink. Respiratory: Respirations even and unlabored.  Lungs clear to auscultation bilaterally.   No wheezes, crackles, or rhonchi.  Cardiovascular:  Regular rate and rhythm. No peripheral edema, cyanosis or pallor.  Gastrointestinal:  Soft, nondistended, nontender. No rebound or guarding. Normal bowel sounds. No appreciable masses or hepatomegaly. Rectal:  Declines Msk:  Symmetrical without gross deformities. Without edema, no deformity or joint abnormality.  Neurologic:  Alert and  oriented x4;  grossly normal neurologically.  Skin:   Dry and intact without significant lesions or rashes. Psychiatric: Oriented to person, place and time. Demonstrates good judgement and reason without abnormal affect or behaviors.  RELEVANT LABS AND IMAGING: CBC    Component Value Date/Time   WBC 4.9 03/26/2024 1433   WBC 10.8 (H) 11/25/2023 2146   RBC 4.96 03/26/2024 1433   RBC 4.28 11/25/2023 2146   HGB 14.3 03/26/2024 1433   HCT 44.6 03/26/2024 1433   PLT 290 03/26/2024 1433   MCV 90 03/26/2024 1433   MCH 28.8 03/26/2024 1433   MCH 29.2 11/25/2023 2146   MCHC 32.1 03/26/2024 1433   MCHC 32.8 11/25/2023 2146   RDW 13.8 03/26/2024 1433   LYMPHSABS 1.5 03/26/2024 1433   MONOABS 0.6 11/25/2023 2146   EOSABS 0.0 03/26/2024 1433   BASOSABS 0.0 03/26/2024 1433    CMP     Component Value Date/Time   NA 139 03/26/2024 1433   K 4.8 03/26/2024 1433   CL 103 03/26/2024 1433   CO2 21 03/26/2024 1433   GLUCOSE 85 03/26/2024 1433   GLUCOSE 117 (H) 11/25/2023 2146   BUN 16 03/26/2024 1433   CREATININE 1.08 (H) 03/26/2024 1433   CREATININE 0.68 08/30/2012 1155   CALCIUM  10.7 (H) 03/26/2024 1433   PROT 6.7 03/26/2024 1433   ALBUMIN  4.8 03/26/2024 1433   AST 20 03/26/2024 1433   ALT 24 03/26/2024 1433   ALKPHOS 134 03/26/2024 1433  BILITOT 0.5 03/26/2024 1433   GFRNONAA >60 11/25/2023 2146   GFRNONAA >89 08/30/2012 1155   GFRAA 102 08/02/2020 1115   GFRAA >89 08/30/2012 1155     Assessment/Plan:   Chronic diarrhea post-cholecystectomy Chronic  varying loose stool with negative infectious workup. Previous normal colonoscopy and endoscopy.  Initially improved on colestipol  now with recurrent symptoms (currently on 1 pill/day).  Also has a stool with urination often times.  Indicative of pelvic floor dyssynergia.  Multiple previous abdominal imaging without evidence of overflow. - Increase colestipol  to 2 pills/day - Increase water, increase fiber, increase exercise - Discussed pelvic floor physical therapy - Squatty potty with bowel movements - Follow-up with me in 4 to 6 weeks  Hemorrhoids Reported flare of hemorrhoids a few weeks ago which has since improved characterized by discomfort without bleeding.  Declined rectal.  Evaluate during colonoscopy as well - Will send in hydrocortisone  cream twice daily for 2 weeks for her to use if this returns - Increase fiber, increase water, increase exercise - Recommended gentle wiping  Lynch syndrome EGD/colonoscopy 2024 with EGD normal and colonoscopy showed 2 small polyps biopsied as Benign Schwann cell hamartoma - Repeat colonoscopy 06/2024 - Repeat EGD 06/2024 [facility] setting due to previous history of difficult airway - Neurosurgery clearance due to previous history of tumors  Mandibular fibrodysplasia Scheduled for surgery July 15, 2024 - Advised if she is given narcotic pain medication for surgery to stop colestipol  and possibly go on MiraLAX   Nephrolithiasis    Matricia Begnaud Mollie RIGGERS [facility] 05/05/2024, 2:45 PM  Cc: [name], DO

## 2024-05-05 NOTE — Patient Instructions (Signed)
 _______________________________________________________  If your blood pressure at your visit was 140/90 or greater, please contact your primary care physician to follow up on this.  _______________________________________________________  If you are age 51 or older, your body mass index should be between 23-30. Your Body mass index is 27.98 kg/m. If this is out of the aforementioned range listed, please consider follow up with your Primary Care Provider.  If you are age 80 or younger, your body mass index should be between 19-25. Your Body mass index is 27.98 kg/m. If this is out of the aformentioned range listed, please consider follow up with your Primary Care Provider.   ________________________________________________________  The [facility] providers would like to encourage you to use MYCHART to communicate with providers for non-urgent requests or questions.  Due to long hold times on the telephone, sending your provider a message by [facility] may be a faster and more efficient way to get a response.  Please allow 48 business hours for a response.  Please remember that this is for non-urgent requests.  _______________________________________________________  [facility] is using a team-based approach to care.  Your team is made up of your doctor and two to three APPS. Our APPS (Nurse Practitioners and Physician Assistants) work with your physician to ensure care continuity for you. They are fully qualified to address your health concerns and develop a treatment plan. They communicate directly with your gastroenterologist to care for you. Seeing the Advanced Practice Practitioners on your physician's team can help you by facilitating care more promptly, often allowing for earlier appointments, access to diagnostic testing, procedures, and other specialty referrals.   We have sent the following medications to your pharmacy for you to pick up at your convenience: Increase  Colestipol  to 2 tablets daily  You have been scheduled for an endoscopy and colonoscopy. Please follow the written instructions given to you at your visit today.  If you use inhalers (even only as needed), please bring them with you on the day of your procedure.  DO NOT TAKE 7 DAYS PRIOR TO TEST- Trulicity (dulaglutide) Ozempic, Wegovy (semaglutide) Mounjaro, Zepbound (tirzepatide) Bydureon Bcise (exanatide extended release)  DO NOT TAKE 1 DAY PRIOR TO YOUR TEST Rybelsus (semaglutide) Adlyxin (lixisenatide) Victoza (liraglutide) Byetta (exanatide) ___________________________________________________________________________  Due to recent changes in healthcare laws, you may see the results of your imaging and laboratory studies on MyChart before your provider has had a chance to review them.  We understand that in some cases there may be results that are confusing or concerning to you. Not all laboratory results come back in the same time frame and the provider may be waiting for multiple results in order to interpret others.  Please give us  48 hours in order for your provider to thoroughly review all the results before contacting the office for clarification of your results.

## 2024-05-05 NOTE — Progress Notes (Signed)
    05/05/2024   Chief Complaint  Patient presents with   Shoulder Pain    Both / feeling better    No diagnosis found.  What pharmacy do you use ? _____[address]______  DOI/DOS/ Date:    Did you get better, worse or no change (Answer below)   Improved

## 2024-05-07 NOTE — Progress Notes (Signed)
 Agree with the assessment and plan as outlined by Boone Master, PA-C.  Nimrat Woolworth, DO, [facility]

## 2024-06-02 ENCOUNTER — Encounter: Payer: Self-pay | Admitting: Family Medicine

## 2024-06-10 ENCOUNTER — Encounter: Payer: Self-pay | Admitting: Nurse Practitioner

## 2024-06-10 ENCOUNTER — Ambulatory Visit (INDEPENDENT_AMBULATORY_CARE_PROVIDER_SITE_OTHER): Admitting: Nurse Practitioner

## 2024-06-10 ENCOUNTER — Ambulatory Visit: Payer: Self-pay | Admitting: Nurse Practitioner

## 2024-06-10 VITALS — BP 126/73 | HR 99 | Temp 98.1°F | Ht 65.0 in | Wt 166.6 lb

## 2024-06-10 DIAGNOSIS — Z2839 Other underimmunization status: Secondary | ICD-10-CM

## 2024-06-10 DIAGNOSIS — Z113 Encounter for screening for infections with a predominantly sexual mode of transmission: Secondary | ICD-10-CM

## 2024-06-10 DIAGNOSIS — N898 Other specified noninflammatory disorders of vagina: Secondary | ICD-10-CM | POA: Diagnosis not present

## 2024-06-10 DIAGNOSIS — Z23 Encounter for immunization: Secondary | ICD-10-CM | POA: Diagnosis not present

## 2024-06-10 LAB — WET PREP FOR TRICH, YEAST, CLUE
Clue Cell Exam: NEGATIVE
Trichomonas Exam: NEGATIVE
Yeast Exam: NEGATIVE

## 2024-06-10 NOTE — Progress Notes (Addendum)
 "    Subjective:  Patient ID: Laurie Kelley, female    DOB: 1970/10/20, 53 y.o.   MRN: [id number]  Patient Care Team: [name], DO as PCP - General (Family Medicine) Rakes, Rock HERO, FNP (Family Medicine) Marilynn Nest, DO as Consulting Physician (Obstetrics and Gynecology) Rosslyn Dino HERO, MD as Physician Assistant (Neurosurgery) Carrol Lonni Sharper, MD as Referring Physician (Plastic Surgery)   Chief Complaint:  sore on vaginal area (X 2 weeks on and off )   HPI: Laurie Kelley is a 53 y.o. female presenting on 06/10/2024 for sore on vaginal area (X 2 weeks on and off )   Discussed the use of AI scribe software for clinical note transcription with the patient, who gave verbal consent to proceed.  History of Present Illness [name] is a 53 year old female with Lynch syndrome who presents with discomfort on the right side of her vagina.  She has been experiencing an uncomfortable sensation on the right side of her vagina for the past two weeks, described as a feeling of something being there, which comes and goes. There is no associated pain, itchiness, or vaginal discharge. She has not attempted any treatments for this issue.  She recently became sexually active again after a year of abstinence. She had a full hysterectomy two years ago due to Lynch syndrome. She has a history of bleeding during sex once last year after her hysterectomy but currently denies any bleeding or dryness during intercourse.  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 This condition causes significant pain when opening her mouth to eat. She has had two craniotomies in the past and questions whether she was tested for HIV or AIDS during those procedures. She has not been asked about STD testing by her primary care or GYN providers.  She previously worked in a jail and received hepatitis  vaccinations approximately twenty years ago. Recent blood work indicated low levels of hepatitis antibodies.   Labs done 03/26/2024 and hep B surf cam back no immunity wa advised to resatrt Hep, an dwould like to get it so   Relevant past medical, surgical, family, and social history reviewed and updated as indicated.  Allergies and medications reviewed and updated. Data reviewed: Chart in Epic.   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   Lynch syndrome    PONV (postoperative nausea and vomiting)     Past Surgical History:  Procedure Laterality Date   ABDOMINAL HYSTERECTOMY N/A 03/08/2022   Procedure: HYSTERECTOMY ABDOMINAL;  Surgeon: Ozan, Jennifer, DO;  Location: [facility];  Service: Gynecology;  Laterality: N/A;   ADJACENT TISSUE TRANSFER/TISSUE REARRANGEMENT Right 11/22/2022   Procedure: Fat grafting to right  temple from right buttocks after brain surgery;  Surgeon: Lowery Estefana RAMAN, DO;  Location: [facility];  Service: Plastics;  Laterality: Right;  APPLICATION OF CRANIAL NAVIGATION Left 02/18/2021   Procedure: APPLICATION OF CRANIAL NAVIGATION;  Surgeon: Cheryle Debby LABOR, MD;  Location: [facility];  Service: Neurosurgery;  Laterality: Left;   APPLICATION OF CRANIAL NAVIGATION N/A 05/18/2021   Procedure: APPLICATION OF CRANIAL NAVIGATION;  Surgeon: Cheryle Debby LABOR, MD;  Location: [facility];  Service: Neurosurgery;  Laterality: N/A;   BIOPSY  06/28/2022   Procedure: BIOPSY;  Surgeon: San Sandor GAILS, DO;  Location: [facility];   Service: Gastroenterology;;   BRAIN SURGERY     CHOLECYSTECTOMY  1993   COLONOSCOPY WITH PROPOFOL  N/A 06/28/2022   Procedure: COLONOSCOPY WITH PROPOFOL ;  Surgeon: San Sandor GAILS, DO;  Location: [facility];  Service: Gastroenterology;  Laterality: N/A;   CRANIOTOMY Left 02/18/2021   Procedure: Left orbitozygomatic craniotomy for tumor resection  with Brain Lab;  Surgeon: Cheryle Debby LABOR, MD;  Location: [facility];  Service: Neurosurgery;  Laterality: Left;   CRANIOTOMY Right 05/18/2021   Procedure: Right Orbitozygomatic craniotomy for tumor with brainlab;  Surgeon: Cheryle Debby LABOR, MD;  Location: [facility];  Service: Neurosurgery;  Laterality: Right;   ESOPHAGOGASTRODUODENOSCOPY (EGD) WITH PROPOFOL  N/A 06/28/2022   Procedure: ESOPHAGOGASTRODUODENOSCOPY (EGD) WITH PROPOFOL ;  Surgeon: San Sandor GAILS, DO;  Location: [facility];  Service: Gastroenterology;  Laterality: N/A;   EXTRACORPOREAL SHOCK WAVE LITHOTRIPSY Right 12/04/2023   Procedure: LITHOTRIPSY, ESWL;  Surgeon: Sherrilee Belvie CROME, MD;  Location: [facility];  Service: Urology;  Laterality: Right;   EXTRACORPOREAL SHOCK WAVE LITHOTRIPSY Left 01/01/2024   Procedure: LITHOTRIPSY, ESWL;  Surgeon: Sherrilee Belvie CROME, MD;  Location: [facility];  Service: Urology;  Laterality: Left;   PARATHYROIDECTOMY Right 04/23/2023   Procedure: RIGHT INFERIOR PARATHYROIDECTOMY;  Surgeon: Eletha Boas, MD;  Location: [facility];  Service: General;  Laterality: Right;   POLYPECTOMY  06/28/2022   Procedure: POLYPECTOMY;  Surgeon: San Sandor GAILS, DO;  Location: [facility];  Service: Gastroenterology;;   SALPINGOOPHORECTOMY Bilateral 03/08/2022   Procedure: OPEN SALPINGO OOPHORECTOMY;  Surgeon: Marilynn Nest, DO;  Location: [facility];  Service: Gynecology;  Laterality: Bilateral;   SHOULDER ARTHROSCOPY Right 11/06/2019   Procedure: ARTHROSCOPY RIGHT SHOULDER, DEBRIDEMENT X TWO STRUCTURES; OPEN ROTATOR CUFF REPAIR;  Surgeon: Margrette Taft BRAVO, MD;  Location: [facility];  Service: Orthopedics;  Laterality: Right;    Social History   Socioeconomic History   Marital status: Divorced    Spouse name: Not on file   Number of children: 2   Years of education: Not on file   Highest education level: Associate degree: occupational, scientist, product/process development, or vocational program  Occupational History   Not on file  Tobacco Use   Smoking status: Never   Smokeless tobacco: Never  Vaping Use   Vaping status: Never Used  Substance and Sexual Activity   Alcohol use: No   Drug use: Never   Sexual activity: Yes    Birth control/protection: Surgical    Comment: hysterectomy  Other Topics Concern   Not on file  Social History Narrative   Not on file   Social Drivers of Health   Tobacco Use: Low Risk (06/10/2024)   Patient History    Smoking Tobacco Use: Never    Smokeless Tobacco Use: Never    Passive Exposure: Not on file  Financial Resource Strain: Low Risk (12/22/2023)   Overall Financial Resource Strain (CARDIA)    Difficulty of Paying Living Expenses: Not hard at all  Food Insecurity: Low Risk (05/08/2024)   Received from [facility]   Epic    Within the  past 12 months, you worried that your food would run out before you got money to buy more: Never true    Within the past 12 months, the food you bought just didn't last and you didn't have money to get more. : Never true  Transportation Needs: No Transportation Needs (05/08/2024)   Received from [facility]    In the past 12 months, has lack of reliable transportation kept you from medical appointments, meetings, work or from getting things needed for daily living? : No  Physical Activity: Inactive (12/22/2023)   Exercise Vital Sign    Days of Exercise per Week: 0 days    Minutes of Exercise per Session: Not on file  Stress: No Stress Concern Present (12/22/2023)   [employer] of Occupational Health - Occupational Stress Questionnaire    Feeling of Stress: Only a little  Social  Connections: Unknown (06/10/2024)   Social Connection and Isolation Panel    Frequency of Communication with Friends and Family: Not on file    Frequency of Social Gatherings with Friends and Family: Not on file    Attends Religious Services: Not on file    Active Member of Clubs or Organizations: Not on file    Attends Banker Meetings: Not on file    Marital Status: Divorced  Intimate Partner Violence: Not At Risk (09/09/2023)   Received from [facility]   HITS    Over the last 12 months how often did your partner physically hurt you?: Never    Over the last 12 months how often did your partner insult you or talk down to you?: Never    Over the last 12 months how often did your partner threaten you with physical harm?: Never    Over the last 12 months how often did your partner scream or curse at you?: Never  Depression (PHQ2-9): Low Risk (03/26/2024)   Depression (PHQ2-9)    PHQ-2 Score: 0  Alcohol Screen: Not on file  Housing: Low Risk (05/08/2024)   Received from [facility]   Epic    What is your living situation today?: I have a steady place to live    Think about the place you live. Do you have problems with any of the following? Choose all that apply:: None/None on this list  Utilities: Low Risk (05/08/2024)   Received from [facility]   Utilities    In the past 12 months has the electric, gas, oil, or water company threatened to shut off services in your home? : No  Health Literacy: Not on file    Outpatient Encounter Medications as of 06/10/2024  Medication Sig   atorvastatin  (LIPITOR) 20 MG tablet Take 1 tablet (20 mg total) by mouth daily.   colestipol  (COLESTID ) 1 g tablet Take 2 tablets (2 g total) by mouth daily.   DULoxetine  (CYMBALTA ) 60 MG capsule Take 1 capsule (60 mg total) by mouth daily.   gabapentin  (NEURONTIN ) 600 MG tablet Take 1 tablet (600 mg total) by mouth 3 (three) times daily.   hydrocortisone  (ANUSOL -HC) 2.5 % rectal cream Place 1  Application rectally 2 (two) times daily.   hydrocortisone  1 % ointment Apply 1 Application topically 2 (two) times daily as needed for itching.   meloxicam  (MOBIC ) 7.5 MG tablet Take 1 tablet (7.5 mg total) by mouth daily.   Probiotic Product (PROBIOTIC DAILY PO) Take 1 tablet by mouth daily. Olly   No facility-administered encounter medications on file as of 06/10/2024.  Allergies[1]  Pertinent ROS per HPI, otherwise unremarkable      Objective:  BP 126/73   Pulse 99   Temp 98.1 F (36.7 C)   Ht 5' 5 (1.651 m)   Wt 166 lb 9.6 oz (75.6 kg)   SpO2 98%   BMI 27.72 kg/m    Wt Readings from Last 3 Encounters:  06/10/24 166 lb 9.6 oz (75.6 kg)  05/05/24 168 lb 2 oz (76.3 kg)  03/26/24 157 lb 8 oz (71.4 kg)   Microscopic wet-mount exam shows negative for pathogens, normal epithelial cells.   Physical Exam Vitals and nursing note reviewed. Chaperone present: No chaperone present patient agreed.  HENT:     Head: Normocephalic and atraumatic.     Nose: Nose normal.     Mouth/Throat:     Mouth: Mucous membranes are moist.  Eyes:     General: No scleral icterus.    Extraocular Movements: Extraocular movements intact.     Conjunctiva/sclera: Conjunctivae normal.     Pupils: Pupils are equal, round, and reactive to light.  Cardiovascular:     Heart sounds: Normal heart sounds.  Pulmonary:     Effort: Pulmonary effort is normal.     Breath sounds: Normal breath sounds.  Abdominal:     General: Bowel sounds are normal.     Palpations: Abdomen is soft.     Tenderness: There is no abdominal tenderness.  Genitourinary:    Exam position: Lithotomy position.     Pubic Area: No rash or pubic lice.      Labia:        Right: No rash or tenderness.        Left: No tenderness.      Urethra: No urethral swelling or urethral lesion.     Vagina: No vaginal discharge, erythema, tenderness, bleeding or lesions.     Comments: History of for hysterectomy Musculoskeletal:         General: Normal range of motion.     Right lower leg: No edema.     Left lower leg: No edema.  Skin:    General: Skin is warm and dry.     Findings: Rash present.  Neurological:     Mental Status: She is alert and oriented to person, place, and time.  Psychiatric:        Mood and Affect: Mood normal.        Behavior: Behavior normal.        Thought Content: Thought content normal.        Judgment: Judgment normal.    Physical Exam GENITOURINARY: External vagina normal, no lesions. Vaginal lining normal, no sores, symmetrical bilaterally.     Results for orders placed or performed in visit on 03/26/24  Microscopic Examination   Collection Time: 03/26/24  1:49 PM   Urine  Result Value Ref Range   WBC, UA 0-5 0 - 5 /hpf   RBC, Urine None seen 0 - 2 /hpf   Epithelial Cells (non renal) 0-10 0 - 10 /hpf   Renal Epithel, UA None seen None seen /hpf   Casts Present (A) None seen /lpf   Cast Type Hyaline casts N/A   Crystals Present (A) N/A   Crystal Type Calcium  Oxalate N/A   Mucus, UA None seen Not Estab.   Bacteria, UA Few (A) None seen/Few   Yeast, UA None seen None seen  Urinalysis, Routine w reflex microscopic   Collection Time: 03/26/24  1:49 PM  Result Value Ref  Range   Specific Gravity, UA 1.025 1.005 - 1.030   pH, UA 5.5 5.0 - 7.5   Color, UA Yellow Yellow   Appearance Ur Clear Clear   Leukocytes,UA Trace (A) Negative   Protein,UA 1+ (A) Negative/Trace   Glucose, UA Negative Negative   Ketones, UA Trace (A) Negative   RBC, UA Negative Negative   Bilirubin, UA Comment (A) Negative   Urobilinogen, Ur 1.0 0.2 - 1.0 mg/dL   Nitrite, UA Negative Negative   Microscopic Examination See below:   Lipid Panel   Collection Time: 03/26/24  2:33 PM  Result Value Ref Range   Cholesterol, Total 167 100 - 199 mg/dL   Triglycerides 80 0 - 149 mg/dL   HDL 48 >60 mg/dL   VLDL Cholesterol Cal 15 5 - 40 mg/dL   LDL Chol Calc (NIH) 895 (H) 0 - 99 mg/dL   Chol/HDL Ratio 3.5 0.0  - 4.4 ratio  CMP14+EGFR   Collection Time: 03/26/24  2:33 PM  Result Value Ref Range   Glucose 85 70 - 99 mg/dL   BUN 16 6 - 24 mg/dL   Creatinine, Ser 8.91 (H) 0.57 - 1.00 mg/dL   eGFR 62 >40 fO/fpw/8.26   BUN/Creatinine Ratio 15 9 - 23   Sodium 139 134 - 144 mmol/L   Potassium 4.8 3.5 - 5.2 mmol/L   Chloride 103 96 - 106 mmol/L   CO2 21 20 - 29 mmol/L   Calcium  10.7 (H) 8.7 - 10.2 mg/dL   Total Protein 6.7 6.0 - 8.5 g/dL   Albumin  4.8 3.8 - 4.9 g/dL   Globulin, Total 1.9 1.5 - 4.5 g/dL   Bilirubin Total 0.5 0.0 - 1.2 mg/dL   Alkaline Phosphatase 134 49 - 135 IU/L   AST 20 0 - 40 IU/L   ALT 24 0 - 32 IU/L  VITAMIN D  25 Hydroxy (Vit-D Deficiency, Fractures)   Collection Time: 03/26/24  2:33 PM  Result Value Ref Range   Vit D, 25-Hydroxy 43.0 30.0 - 100.0 ng/mL  TSH   Collection Time: 03/26/24  2:33 PM  Result Value Ref Range   TSH 1.160 0.450 - 4.500 uIU/mL  Hepatitis B surface antibody,quantitative   Collection Time: 03/26/24  2:33 PM  Result Value Ref Range   Hepatitis B Surf Ab Quant <3.5 (L) Immunity>10 mIU/mL  CBC with Differential   Collection Time: 03/26/24  2:33 PM  Result Value Ref Range   WBC 4.9 3.4 - 10.8 x10E3/uL   RBC 4.96 3.77 - 5.28 x10E6/uL   Hemoglobin 14.3 11.1 - 15.9 g/dL   Hematocrit 55.3 65.9 - 46.6 %   MCV 90 79 - 97 fL   MCH 28.8 26.6 - 33.0 pg   MCHC 32.1 31.5 - 35.7 g/dL   RDW 86.1 88.2 - 84.5 %   Platelets 290 150 - 450 x10E3/uL   Neutrophils 62 Not Estab. %   Lymphs 30 Not Estab. %   Monocytes 6 Not Estab. %   Eos 1 Not Estab. %   Basos 1 Not Estab. %   Neutrophils Absolute 3.1 1.4 - 7.0 x10E3/uL   Lymphocytes Absolute 1.5 0.7 - 3.1 x10E3/uL   Monocytes Absolute 0.3 0.1 - 0.9 x10E3/uL   EOS (ABSOLUTE) 0.0 0.0 - 0.4 x10E3/uL   Basophils Absolute 0.0 0.0 - 0.2 x10E3/uL   Immature Granulocytes 0 Not Estab. %   Immature Grans (Abs) 0.0 0.0 - 0.1 x10E3/uL  Protime-INR   Collection Time: 03/26/24  2:33 PM  Result Value  Ref Range   INR  1.0 0.9 - 1.2   Prothrombin Time 10.9 9.1 - 12.0 sec  Urine Culture   Collection Time: 03/26/24  2:38 PM   Specimen: Urine   UR  Result Value Ref Range   Urine Culture, Routine Final report    Organism ID, Bacteria Comment        Pertinent labs & imaging results that were available during my care of the patient were reviewed by me and considered in my medical decision making.  Assessment & Plan:  There are no diagnoses linked to this encounter.   Assessment and Plan Darice is a 53 year old female seen today for vaginal i discomfort no acute distress Assessment & Plan Vaginal discomfort Intermittent right-sided vaginal discomfort for two weeks without pain, itchiness, or discharge. Differential includes STIs such as chlamydia or herpes. - Sent swab for STI testing including chlamydia and herpes..  Screening for sexually transmitted infections Discussed importance of STI screening due to recent sexual activity and lack of prior HIV testing. - Ordered blood tests for HIV, herpes, syphilis, and other STIs.  Fibro-osseous dysplasia of jaw Diagnosed with fibro-osseous dysplasia causing jaw pain and limited mouth opening. Surgery scheduled in April. - Proceed with planned jaw surgery in April.  General health maintenance Hepatitis B vaccination series needed due to low antibody levels. Discussed importance of up-to-date vaccinations. - Administered hepatitis B vaccination series.    Continue all other maintenance medications.  Follow up plan: No follow-ups on file.   Continue healthy lifestyle choices, including diet (rich in fruits, vegetables, and lean proteins, and low in salt and simple carbohydrates) and exercise (at least 30 minutes of moderate physical activity daily).  Educational handout given for   Clinical References  Hepatitis B Vaccine: What You Need to Know Many vaccine information statements are available in Spanish and other languages. See  promoage.com.br. 1. Why get vaccinated? Hepatitis B vaccine can prevent hepatitis B. Hepatitis B is a liver disease that can cause mild illness lasting a few weeks, or it can lead to a serious, lifelong illness. Acute hepatitis B is a short-term illness that can lead to fever, fatigue, loss of appetite, nausea, vomiting, jaundice (yellow skin or eyes, dark urine, clay-colored bowel movements), and pain in the muscles, joints, and stomach. Chronic hepatitis B is a long-term illness that occurs when the hepatitis B virus remains in a person's body. Most people who go on to develop chronic hepatitis B do not have symptoms, but it is still very serious and can lead to liver damage (cirrhosis), liver cancer, and death. Chronically infected people can spread hepatitis B virus to others, even if they do not feel or look sick themselves. Hepatitis B is spread when blood, semen, or other body fluid infected with the hepatitis B virus enters the body of a person who is not infected. People can become infected through: Birth (if a pregnant person has hepatitis B, their baby can become infected) Sharing items such as razors or toothbrushes with an infected person Contact with the blood or open sores of an infected person Sex with an infected partner Sharing needles, syringes, or other drug-injection equipment Exposure to blood from needlesticks or other sharp instruments Most people who are vaccinated with hepatitis B vaccine are immune for life. 2. Hepatitis B vaccine Hepatitis B vaccine is usually given as 2, 3, or 4 shots. Infants should get their first dose of hepatitis B vaccine at birth and will usually complete the series at 6-18  months of age. The birth dose of hepatitis B vaccine is an important part of preventing long-term illness in infants and the spread of hepatitis B in the [place] . Anyone 28 years of age or younger who has not yet gotten the vaccine should be vaccinated. Hepatitis B  vaccination is recommended for adults 60 years or older at increased risk of exposure to hepatitis B who were not vaccinated previously. Adults 60 years or older who are not at increased risk and were not vaccinated in the past may also be vaccinated. Hepatitis B vaccine may be given as a stand-alone vaccine, or as part of a combination vaccine (a type of vaccine that combines more than one vaccine together into one shot). Hepatitis B vaccine may be given at the same time as other vaccines. 3. Talk with your health care provider Tell your vaccination provider if the person getting the vaccine: Has had an allergic reaction after a previous dose of hepatitis B vaccine, or has any severe, life-threatening allergies In some cases, your health care provider may decide to postpone hepatitis B vaccination until a future visit. Pregnant or breastfeeding people who were not vaccinated previously should be vaccinated. Pregnancy or breastfeeding are not reasons to avoid hepatitis B vaccination. People with minor illnesses, such as a cold, may be vaccinated. People who are moderately or severely ill should usually wait until they recover before getting hepatitis B vaccine. Your health care provider can give you more information. 4. Risks of a vaccine reaction Soreness where the shot is given, fever, headache, and fatigue (feeling tired) can happen after hepatitis B vaccination. People sometimes faint after medical procedures, including vaccination. Tell your provider if you feel dizzy or have vision changes or ringing in the ears. As with any medicine, there is a very remote chance of a vaccine causing a severe allergic reaction, other serious injury, or death. 5. What if there is a serious problem? An allergic reaction could occur after the vaccinated person leaves the clinic. If you see signs of a severe allergic reaction (hives, swelling of the face and throat, difficulty breathing, a fast heartbeat,  dizziness, or weakness), call 9-1-1 and get the person to the nearest hospital. For other signs that concern you, call your health care provider. Adverse reactions should be reported to the Vaccine Adverse Event Reporting System (VAERS). Your health care provider will usually file this report, or you can do it yourself. Visit the VAERS website at www.vaers.lagents.no or call 3466594386.VAERS is only for reporting reactions, and VAERS staff members do not give medical advice. 6. The National Vaccine Injury Compensation Program The [employer] Vaccine Injury Compensation Program (VICP) is a federal program that was created to compensate people who may have been injured by certain vaccines. Claims regarding alleged injury or death due to vaccination have a time limit for filing, which may be as short as two years. Visit the VICP website at spiritualword.at or call 913-549-4908 to learn about the program and about filing a claim. 7. How can I learn more? Ask your health care provider. Call your local or state health department. Visit the website of the Food and Drug Administration (FDA) for vaccine package inserts and additional information at finderlist.no. Contact the Centers for Disease Control and Prevention (CDC): Call 316 158 7463 (1-800-CDC-INFO) or Visit CDC's website at piccapture.uy. Source: CDC Vaccine Information Statement Hepatitis B Vaccine (10/28/2021) This same material is available at footballexhibition.com.br for no charge. This information is not intended to replace advice given to you  by your health care provider. Make sure you discuss any questions you have with your health care provider. Document Revised: 06/22/2022 Document Reviewed: 06/22/2022 Elsevier Patient Education  2024 Elsevier Inc. Herpes Simplex Test Why am I having this test? The herpes simplex test is used to check for an infection with the herpes simplex virus (HSV). There  are two common types of HSV: Type 1 (HSV1) primarily causes cold sores on or around the mouth and sometimes on or around the eyes. Type 2 (HSV2) is typically sexually transmitted and causes sores in and around the genitals. Both Type 1 and Type 2 can affect either the mouth and eyes or genital areas. You may need to have an HSV test if: Your health care provider thinks that you may have an HSV infection. You have a weakened body defense system (immune system) and you have sores around your mouth or genitals that look like HSV eruptions. You have sex with multiple partners, or your partner has genital herpes. You are pregnant, have herpes, and are expecting to deliver a baby vaginally in the next 6-8 weeks. What is being tested? There are two types of herpes simplex tests: Blood test. This test checks the sample for: HSV antibodies. Antibodies are proteins that your body makes to help fight infection. This test checks whether antibodies against HSV are in your blood. HSV antigens. This checks for the presence of the HSV virus (antigen) in your blood. Culture test. This test checks for the virus in a sample of fluid from an open sore. Culture tests take several days to complete. What kind of sample is taken?     Samples will be collected according to the type of tests your health care provider orders. For the blood tests, a blood sample is usually collected by inserting a needle into a blood vessel. For a culture test, the sample is usually collected by swabbing the fluid that is coming from an open sore during an active infection (outbreak). How are the results reported? Your test results will be reported as either positive or negative. For this test, normal results are: Negative for HSV virus or antibodies in your blood. Negative for HSV virus in cultured fluid. What do the results mean? A positive result may indicate that you have an active HSV infection. The presence of HSV1 or HSV2  antigens or antibodies in your blood may mean that you have an active HSV infection. A negative result means that you do not have HSV1 or HSV2 virus in your blood. This may mean that you do not have an HSV infection. Talk with your health care provider about what your results mean. In some cases, your health care provider may do more testing to confirm the results. Questions to ask your health care provider Ask your health care provider, or the department that is doing the test: When will my results be ready? How will I get my results? What are my treatment options? What other tests do I need? What are my next steps? Summary You may have this test if your health care provider suspects that you have a herpes simplex virus (HSV) infection. Type 1 (HSV1) primarily causes cold sores on or around the mouth and sometimes on or around the eyes. Type 2 (HSV2) is typically sexually transmitted and causes sores in and around the genitals. The test may be done using a blood sample or a sample of fluid from an open sore. A positive result may mean that you have an  active HSV infection. A negative result means that you probably do not have an active infection. Talk with your health care provider about what your results mean. This information is not intended to replace advice given to you by your health care provider. Make sure you discuss any questions you have with your health care provider. Document Revised: 04/18/2021 Document Reviewed: 04/18/2021 Elsevier Patient Education  2024 Elsevier Inc. Syphilis Syphilis is a curable infection that can spread through sexual contact. It is important to get treatment right away to reduce the possibility of serious complications. There are four stages of syphilis: Primary stage. During this stage, sores may form where the disease entered your body. Secondary stage. During this stage, skin rashes and lesions will form. Latent stage. During this stage, there are no  symptoms, but the infection may still be contagious. Tertiary stage. This stage happens 5-30 years after the infection starts. During this stage, the disease damages organs and can lead to death. Most people with treated syphilis do not develop this stage. What are the causes? This condition is caused by bacteria called Treponema pallidum. The condition can spread during sexual activity, such as during oral, anal, or vaginal sex. It can also be spread to an unborn baby (fetus) during pregnancy. What increases the risk? You are more likely to develop this condition if: You do not use a condom during sex. You have sex with a partner who has syphilis. You have a history of sexually transmitted infections (STIs). You use recreational drugs such as methamphetamines, injection drugs, or heroin. What are the signs or symptoms? Symptoms of this condition depend on the stage of the disease. Primary stage One or more painless sores (chancres) in and around the genital organs, mouth, or hands. The sores are usually firm and round. Secondary stage Skin rashes or sores (or both) in the mouth, vagina, anus, palms of the hands, or bottoms of the feet. The rash may be rough, red or reddish-brown, and may not itch. Other symptoms may include: A fever. Swollen lymph glands. A sore throat. Patchy hair loss. A headache. A feeling of being ill or very tired. Weight loss. Latent stage There are no symptoms during this stage. Tertiary stage This stage is rarely noted due to the use of antibiotic medicine to treat syphilis. However, without treatment, syphilis can further affect different organ systems, especially the heart and nervous system. Signs and symptoms impacting the heart include: Enlargement of the heart. Damage to the aorta. Narrowing of the blood vessels around the heart. Signs and symptoms impacting the nervous system include: Meningitis. Dysfunction of the nerves to the head. Nonspecific  growths may also occur in the skin, mucous membranes, bones, or body organs in this stage. Any stage if there is no treatment Signs and symptoms of impacting the nervous system include: A severe headache. Muscle weakness or difficulty coordinating movements, such as walking. Changes in mental state, such as confusion, personality changes, or dementia. Signs and symptoms of impacting the eyes include: Eye pain. Eye redness. Changes in vision. Blindness. Signs and symptoms of impacting the ears include: Hearing loss. Ringing in the ears (tinnitus). Dizziness or feeling like you or your surroundings are moving or spinning (vertigo). How is this diagnosed? This condition is diagnosed with: A physical exam. Blood tests, including: Nontreponemal test. This is usually the first test. It can detect other kinds of antibodies as well. Treponemal test. This test is done if you get a positive result on the nontreponemal test. It  specifically looks for antibodies to syphilis. This test will not show whether antibodies are from a past syphilis infection or a current infection. Tests of the fluid (drainage) from a sore or rash. Tests of the fluid around the spine (lumbar puncture). These tests are done to check for an infection in the brain or nervous system in late-stage syphilis. Imaging tests. These may be done to check for damage to the heart, aorta, or brain if the condition is in the tertiary stage. Tests may include: X-ray. CT scan. MRI. Echocardiogram. This test takes a picture of the heart. How is this treated? This condition can be cured with antibiotic medicine. It is important to receive treatment as soon as possible to get rid of the infection. However, the treatment may not undo any damage already caused by the infection. Follow these instructions at home: Medicines  Take over-the-counter and prescription medicines only as told by your health care provider. Take your antibiotic  medicine as told by your health care provider. Do not stop taking the antibiotic even if you start to feel better. Incomplete treatment will put you at risk for continued infection and could be life-threatening. General instructions Do not have sex until your treatment is completed, or as directed by your health care provider. Tell your recent sexual partners that you were diagnosed with syphilis. It is important that they get treatment, even if they do not have symptoms. Keep all follow-up visits. This is important. How is this prevented? Use latex or polyurethane condoms correctly whenever you have sex. Before you have sex, ask your partner if they have been tested for STIs. Ask about the test results. Avoid having multiple sexual partners. Contact a health care provider if: You continue to have any of the following symptoms 24 hours after beginning treatment: Fever. Chills. Headache. Nausea. Aching all over your body. Your symptoms do not improve, even with treatment. Get help right away if: You have severe chest pain. You have trouble walking or coordinating movements. You are confused. You lose vision or hearing. You have numbness in your arms or legs. You have a seizure. You faint. You have a severe headache that does not go away with medicine. These symptoms may be an emergency. Get help right away. Call 911. Do not wait to see if the symptoms will go away. Do not drive yourself to the hospital. Summary Syphilis is an infection that can spread through sexual contact or to a fetus during pregnancy. This condition can cause serious complications, so it is best to get treatment right away. The condition can be cured with antibiotic medicine. Take your antibiotic medicine as told by your health care provider. Tell your recent sexual partners that you were diagnosed with syphilis. It is important that they get treatment, even if they do not have symptoms. This information is not  intended to replace advice given to you by your health care provider. Make sure you discuss any questions you have with your health care provider. Document Revised: 04/29/2021 Document Reviewed: 04/29/2021 Elsevier Patient Education  2024 Elsevier Inc.  The above assessment and management plan was discussed with the patient. The patient verbalized understanding of and has agreed to the management plan. Patient is aware to call the clinic if they develop any new symptoms or if symptoms persist or worsen. Patient is aware when to return to the clinic for a follow-up visit. Patient educated on when it is appropriate to go to the emergency department.    Tessia Kassin St  Morton Hummer, DNP [facility] [address] [place], [place] [address] [phone]       [1]  Allergies Allergen Reactions   Gadolinium Derivatives Other (See Comments)    Pt reports she developed chest tightness, burning of her back, and weakness after MRI contrast 11/2021. Per Dr. Shoshana, he suggests 13 hr protocol prior to MRI.    Iodinated Contrast Media Nausea And Vomiting   Saline Nausea And Vomiting    Patient states she has nausea and vomiting if she has it for a long time, she did not have any N/V after her last ESL procedure   Sodium Chloride  Nausea And Vomiting   "

## 2024-06-11 LAB — HIV ANTIBODY (ROUTINE TESTING W REFLEX): HIV Screen 4th Generation wRfx: NONREACTIVE

## 2024-06-11 LAB — SYPHILIS: RPR W/REFLEX TO RPR TITER AND TREPONEMAL ANTIBODIES, TRADITIONAL SCREENING AND DIAGNOSIS ALGORITHM: RPR Ser Ql: NONREACTIVE

## 2024-06-13 ENCOUNTER — Encounter (HOSPITAL_COMMUNITY): Payer: Self-pay | Admitting: Gastroenterology

## 2024-06-16 ENCOUNTER — Telehealth: Payer: Self-pay | Admitting: Gastroenterology

## 2024-06-16 NOTE — Telephone Encounter (Addendum)
 Procedure:Endoscopy/Colonoscopy Procedure date: 06/26/24 Procedure location: [facility] Arrival Time: 6:00 am Spoke with the patient Y/N: Yes Any prep concerns? No  Has the patient obtained the prep from the pharmacy ? Yes Do you have a care partner and transportation: Yes Any additional concerns? No

## 2024-06-25 ENCOUNTER — Ambulatory Visit: Admitting: Gastroenterology

## 2024-06-26 ENCOUNTER — Other Ambulatory Visit: Payer: Self-pay

## 2024-06-26 ENCOUNTER — Ambulatory Visit (HOSPITAL_COMMUNITY): Admitting: Anesthesiology

## 2024-06-26 ENCOUNTER — Encounter (HOSPITAL_COMMUNITY)
Admission: RE | Disposition: A | Discharge status: Home / Self Care | Payer: Self-pay | Source: Home / Self Care | Attending: Gastroenterology

## 2024-06-26 ENCOUNTER — Ambulatory Visit (HOSPITAL_COMMUNITY)
Admission: RE | Admit: 2024-06-26 | Discharge: 2024-06-26 | Disposition: A | Discharge status: Home / Self Care | Attending: Gastroenterology | Admitting: Gastroenterology

## 2024-06-26 ENCOUNTER — Encounter (HOSPITAL_COMMUNITY): Payer: Self-pay | Admitting: Gastroenterology

## 2024-06-26 DIAGNOSIS — E038 Other specified hypothyroidism: Secondary | ICD-10-CM | POA: Diagnosis not present

## 2024-06-26 DIAGNOSIS — K2970 Gastritis, unspecified, without bleeding: Secondary | ICD-10-CM | POA: Diagnosis not present

## 2024-06-26 DIAGNOSIS — Z15060 Genetic susceptibility to colorectal cancer: Secondary | ICD-10-CM | POA: Diagnosis not present

## 2024-06-26 DIAGNOSIS — K219 Gastro-esophageal reflux disease without esophagitis: Secondary | ICD-10-CM | POA: Diagnosis not present

## 2024-06-26 DIAGNOSIS — Z1509 Genetic susceptibility to other malignant neoplasm: Secondary | ICD-10-CM

## 2024-06-26 DIAGNOSIS — K5730 Diverticulosis of large intestine without perforation or abscess without bleeding: Secondary | ICD-10-CM | POA: Insufficient documentation

## 2024-06-26 DIAGNOSIS — K2950 Unspecified chronic gastritis without bleeding: Secondary | ICD-10-CM | POA: Insufficient documentation

## 2024-06-26 DIAGNOSIS — Z1211 Encounter for screening for malignant neoplasm of colon: Secondary | ICD-10-CM | POA: Diagnosis present

## 2024-06-26 HISTORY — PX: COLONOSCOPY: SHX5424

## 2024-06-26 HISTORY — PX: BIOPSY OF SKIN SUBCUTANEOUS TISSUE AND/OR MUCOUS MEMBRANE: SHX6741

## 2024-06-26 HISTORY — PX: ESOPHAGOGASTRODUODENOSCOPY: SHX5428

## 2024-06-26 SURGERY — EGD (ESOPHAGOGASTRODUODENOSCOPY)
Anesthesia: Monitor Anesthesia Care

## 2024-06-26 MED ORDER — PROPOFOL 500 MG/50ML IV EMUL
INTRAVENOUS | Status: AC
  Filled 2024-06-26: qty 50

## 2024-06-26 MED ORDER — PANTOPRAZOLE SODIUM 40 MG PO TBEC: 40.0000 mg | DELAYED_RELEASE_TABLET | Freq: Two times a day (BID) | ORAL | 1 refills | Status: AC

## 2024-06-26 MED ORDER — PROPOFOL 500 MG/50ML IV EMUL
INTRAVENOUS | Status: DC | PRN
  Administered 2024-06-26: 75 ug/kg/min via INTRAVENOUS

## 2024-06-26 MED ORDER — PROPOFOL 10 MG/ML IV BOLUS
INTRAVENOUS | Status: AC
  Filled 2024-06-26: qty 20

## 2024-06-26 MED ORDER — FENTANYL CITRATE (PF) 100 MCG/2ML IJ SOLN
INTRAMUSCULAR | Status: DC | PRN
  Administered 2024-06-26: 25 ug via INTRAVENOUS

## 2024-06-26 MED ORDER — SODIUM CHLORIDE 0.9 % IV SOLN: INTRAVENOUS | Status: DC

## 2024-06-26 MED ORDER — PROPOFOL 10 MG/ML IV BOLUS
INTRAVENOUS | Status: DC | PRN
  Administered 2024-06-26 (×4): 20 mg via INTRAVENOUS

## 2024-06-26 MED ORDER — FENTANYL CITRATE (PF) 100 MCG/2ML IJ SOLN
INTRAMUSCULAR | Status: AC
  Filled 2024-06-26: qty 2

## 2024-06-26 NOTE — Discharge Instructions (Signed)

## 2024-06-26 NOTE — H&P (Addendum)
 "    GASTROENTEROLOGY PROCEDURE H&P NOTE   Primary Care Physician: [name], DO    Reason for Procedure:  Lynch syndrome, history of colon polyps, diarrhea  Plan:    EGD, colonoscopy   Patient is appropriate for endoscopic procedure(s) at [facility].  The nature of the procedure, as well as the risks, benefits, and alternatives were carefully and thoroughly reviewed with the patient. Ample time for discussion and questions allowed. The patient understood, was satisfied, and agreed to proceed. I personally addressed all patient questions and concerns.     HPI: Laurie Kelley is a 54 y.o. female with a history of Lynch syndrome and colon polyps who presents for EGD and colonoscopy for ongoing surveillance.  Has had diarrhea since prior cholecystectomy, currently being treated with colestipol .  Recently increased to 2 capsules daily with excellent response.  History of intermittent symptomatic hemorrhoids, but no recent symptoms.   Colonoscopy 06/28/2022 - Two 2 to 3 mm polyps in the sigmoid colon, removed with a cold snare. Resected and retrieved.  - Diverticulosis in the sigmoid colon and in the descending colon.  - The distal rectum and anal verge are normal on retroflexion view.  - The examined portion of the ileum was normal. -Repeat 2 years (06/2024)   EGD 06/28/2022 - Normal esophagus.  - Normal stomach. Biopsied.  - Normal examined duodenum. -Repeat 2 to 4 years   A. 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   Lynch syndrome    PONV (postoperative nausea and vomiting)     Past Surgical History:  Procedure Laterality Date   ABDOMINAL HYSTERECTOMY N/A 03/08/2022   Procedure: HYSTERECTOMY ABDOMINAL;  Surgeon: Ozan, Jennifer, DO;  Location: [facility];  Service: Gynecology;  Laterality: N/A;   ADJACENT TISSUE TRANSFER/TISSUE REARRANGEMENT Right 11/22/2022   Procedure: Fat grafting to right  temple from right buttocks after brain surgery;  Surgeon: Lowery Estefana RAMAN, DO;  Location: [facility];  Service: Plastics;  Laterality: Right;   APPLICATION OF CRANIAL NAVIGATION Left 02/18/2021   Procedure: APPLICATION OF CRANIAL NAVIGATION;  Surgeon: Cheryle Debby LABOR, MD;  Location: [facility];  Service: Neurosurgery;  Laterality: Left;   APPLICATION OF CRANIAL NAVIGATION N/A 05/18/2021   Procedure: APPLICATION OF CRANIAL NAVIGATION;  Surgeon: Cheryle Debby LABOR, MD;  Location: [facility];  Service: Neurosurgery;  Laterality: N/A;   BIOPSY  06/28/2022   Procedure: BIOPSY;  Surgeon: San Sandor GAILS, DO;  Location: [facility];  Service: Gastroenterology;;   BRAIN SURGERY     CHOLECYSTECTOMY  1993   COLONOSCOPY WITH PROPOFOL  N/A 06/28/2022   Procedure: COLONOSCOPY WITH PROPOFOL ;  Surgeon: San Sandor GAILS, DO;  Location: [facility];  Service: Gastroenterology;  Laterality: N/A;   CRANIOTOMY Left 02/18/2021   Procedure: Left orbitozygomatic craniotomy for tumor resection  with Brain Lab;  Surgeon: Cheryle Debby LABOR, MD;  Location: [facility] OR;  Service: Neurosurgery;  Laterality: Left;   CRANIOTOMY Right 05/18/2021   Procedure: Right Orbitozygomatic craniotomy for tumor with brainlab;  Surgeon: Cheryle Debby LABOR, MD;   Location: [facility] OR;  Service: Neurosurgery;  Laterality: Right;   ESOPHAGOGASTRODUODENOSCOPY (EGD) WITH PROPOFOL  N/A 06/28/2022   Procedure: ESOPHAGOGASTRODUODENOSCOPY (EGD) WITH PROPOFOL ;  Surgeon: San Sandor GAILS, DO;  Location[facility];  Service: Gastroenterology;  Laterality: N/A;   EXTRACORPOREAL SHOCK WAVE LITHOTRIPSY Right 12/04/2023   Procedure: LITHOTRIPSY, ESWL;  Surgeon: Sherrilee Belvie CROME, MD;  Location: [facility];  Service: Urology;  Laterality: Right;   EXTRACORPOREAL SHOCK WAVE LITHOTRIPSY Left 01/01/2024   Procedure: LITHOTRIPSY, ESWL;  Surgeon: Sherrilee Belvie CROME, MD;  Location: [facility];  Service: Urology;  Laterality: Left;   PARATHYROIDECTOMY Right 04/23/2023   Procedure: RIGHT INFERIOR PARATHYROIDECTOMY;  Surgeon: Eletha Boas, MD;  Location: [facility];  Service: General;  Laterality: Right;   POLYPECTOMY  06/28/2022   Procedure: POLYPECTOMY;  Surgeon: San Sandor GAILS, DO;  Location: [facility];  Service: Gastroenterology;;   SALPINGOOPHORECTOMY Bilateral 03/08/2022   Procedure: OPEN SALPINGO OOPHORECTOMY;  Surgeon: Marilynn Nest, DO;  Location: [facility];  Service: Gynecology;  Laterality: Bilateral;   SHOULDER ARTHROSCOPY Right 11/06/2019   Procedure: ARTHROSCOPY RIGHT SHOULDER, DEBRIDEMENT X TWO STRUCTURES; OPEN ROTATOR CUFF REPAIR;  Surgeon: Margrette Taft BRAVO, MD;  Location: [facility];  Service: Orthopedics;  Laterality: Right;    Prior to Admission medications  Medication Sig Start Date End Date Taking? Authorizing Provider  atorvastatin  (LIPITOR) 20 MG tablet Take 1 tablet (20 mg total) by mouth daily. 03/26/24  Yes Jolinda Potter M, DO  colestipol  (COLESTID ) 1 g tablet Take 2 tablets (2 g total) by mouth daily. 05/05/24  Yes McMichael, Bayley M, PA-C  DULoxetine  (CYMBALTA ) 60 MG capsule Take 1 capsule (60 mg total) by mouth daily. 03/26/24  Yes Gottschalk, Potter M, DO  gabapentin  (NEURONTIN ) 600 MG tablet Take 1 tablet (600 mg total) by mouth 3 (three) times daily.  03/26/24  Yes Jolinda Potter M, DO  meloxicam  (MOBIC ) 7.5 MG tablet Take 1 tablet (7.5 mg total) by mouth daily. 03/28/24  Yes Margrette Taft BRAVO, MD  Probiotic Product (PROBIOTIC DAILY PO) Take 1 tablet by mouth daily. Olly   Yes [provider]  hydrocortisone  (ANUSOL -HC) 2.5 % rectal cream Place 1 Application rectally 2 (two) times daily. 05/05/24   McMichael, Nestor HERO, PA-C  hydrocortisone  1 % ointment Apply 1 Application topically 2 (two) times daily as needed for itching. 03/26/24   Jolinda Potter HERO, DO    Current Facility-Administered Medications  Medication Dose Route Frequency Provider Last Rate Last Admin   0.9 %  sodium chloride  infusion   Intravenous Continuous McMichael, Bayley M, PA-C        Allergies as of 05/05/2024 - Review Complete 05/05/2024  Allergen Reaction Noted   Gadolinium derivatives Other (See Comments) 12/19/2021   Iodinated contrast media Nausea And Vomiting 11/25/2023   Saline Nausea And Vomiting 08/22/2021   Sodium chloride  Nausea And Vomiting 08/22/2021    Family History  Problem Relation Age of Onset   Cancer Mother    Hyperlipidemia Mother    Lung cancer Mother    Prostate cancer Father    Hypertension Father    Heart disease Father    Other Sister  meningiomas/tumors x11   Brain cancer Maternal Aunt        d. 19s-60s   Lung cancer Maternal Aunt    Bone cancer Maternal Aunt        d. 60s-60s   Stomach cancer Paternal Uncle    Liver cancer Paternal Uncle    Lung cancer Paternal Grandmother        d.85   Other Daughter        Lynch syndrome   Rectal cancer Neg Hx    Esophageal cancer Neg Hx    Breast cancer Neg Hx    Colon polyps Neg Hx    Colon cancer Neg Hx    Pancreatic cancer Neg Hx     Social History   Socioeconomic History   Marital status: Divorced    Spouse name: Not on file   Number of children: 2   Years of education: Not on file   Highest education level: Associate degree: occupational, scientist, product/process development,  or vocational program  Occupational History   Not on file  Tobacco Use   Smoking status: Never   Smokeless tobacco: Never  Vaping Use   Vaping status: Never Used  Substance and Sexual Activity   Alcohol use: No   Drug use: Never   Sexual activity: Yes    Birth control/protection: Surgical    Comment: hysterectomy  Other Topics Concern   Not on file  Social History Narrative   Not on file   Social Drivers of Health   Tobacco Use: Low Risk (06/26/2024)   Patient History    Smoking Tobacco Use: Never    Smokeless Tobacco Use: Never    Passive Exposure: Not on file  Financial Resource Strain: Low Risk (12/22/2023)   Overall Financial Resource Strain (CARDIA)    Difficulty of Paying Living Expenses: Not hard at all  Food Insecurity: Low Risk (05/08/2024)   Received from [facility]   Epic    Within the past 12 months, you worried that your food would run out before you got money to buy more: Never true    Within the past 12 months, the food you bought just didn't last and you didn't have money to get more. : Never true  Transportation Needs: No Transportation Needs (05/08/2024)   Received from [facility]    In the past 12 months, has lack of reliable transportation kept you from medical appointments, meetings, work or from getting things needed for daily living? : No  Physical Activity: Inactive (12/22/2023)   Exercise Vital Sign    Days of Exercise per Week: 0 days    Minutes of Exercise per Session: Not on file  Stress: No Stress Concern Present (12/22/2023)   [employer] of Occupational Health - Occupational Stress Questionnaire    Feeling of Stress: Only a little  Social Connections: Unknown (06/10/2024)   Social Connection and Isolation Panel    Frequency of Communication with Friends and Family: Not on file    Frequency of Social Gatherings with Friends and Family: Not on file    Attends Religious Services: Not on file    Active Member of Clubs  or Organizations: Not on file    Attends Banker Meetings: Not on file    Marital Status: Divorced  Intimate Partner Violence: Not At Risk (09/09/2023)   Received from [facility]   HITS    Over the last 12 months how often did your partner physically hurt you?: Never  Over the last 12 months how often did your partner insult you or talk down to you?: Never    Over the last 12 months how often did your partner threaten you with physical harm?: Never    Over the last 12 months how often did your partner scream or curse at you?: Never  Depression (PHQ2-9): Low Risk (03/26/2024)   Depression (PHQ2-9)    PHQ-2 Score: 0  Alcohol Screen: Not on file  Housing: Low Risk (05/08/2024)   Received from [facility]   Epic    What is your living situation today?: I have a steady place to live    Think about the place you live. Do you have problems with any of the following? Choose all that apply:: None/None on this list  Utilities: Low Risk (05/08/2024)   Received from [facility]   Utilities    In the past 12 months has the electric, gas, oil, or water company threatened to shut off services in your home? : No  Health Literacy: Not on file    Physical Exam: Vital signs in last 24 hours: @Ht  5' 5 (1.651 m)   Wt 72.6 kg   BMI 26.63 kg/m  GEN: NAD EYE: Sclerae anicteric ENT: MMM CV: Non-tachycardic Pulm: CTA b/l GI: Soft, NT/ND NEURO:  Alert & Oriented x 3   Sandor Flatter, DO [facility]   06/26/2024 7:20 AM  "

## 2024-06-26 NOTE — Anesthesia Preprocedure Evaluation (Addendum)
"                                    Anesthesia Evaluation  Patient identified by MRN, date of birth, ID band  Reviewed: Allergy & Precautions, NPO status , Patient's Chart, lab work & pertinent test results  History of Anesthesia Complications (+) PONV and history of anesthetic complications  Airway Mallampati: IV  TM Distance: >3 FB Neck ROM: Full  Mouth opening: Limited Mouth Opening  Dental  (+) Teeth Intact, Dental Advisory Given   Pulmonary    breath sounds clear to auscultation       Cardiovascular  Rhythm:Regular Rate:Normal   CT Ca Score (09/2017): IMPRESSION: 1. Coronary artery calcium  score 0 Agatston units, suggesting low risk for future cardiac events. 2.  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    Abdominal   Peds  Hematology   Anesthesia Other Findings   Reproductive/Obstetrics                              Anesthesia Physical Anesthesia Plan  ASA: 2  Anesthesia Plan: MAC   Post-op Pain Management:    Induction: Intravenous  PONV Risk Score and Plan: Propofol  infusion and Treatment may vary due to age or medical condition  Airway Management Planned: Simple Face Mask and Natural Airway  Additional Equipment: None  Intra-op Plan:   Post-operative Plan:   Informed Consent: I have reviewed the patients History and Physical, chart, labs and discussed the procedure including the risks, benefits and alternatives for the proposed anesthesia with the patient or authorized representative who has indicated his/her understanding and acceptance.     Dental advisory given  Plan Discussed with: CRNA  Anesthesia Plan Comments: (Plan for MAC with light sedation due to airway  concerns. Discussed with patient if need for airway manipulation, will have videoscope and fiberoptic scope available. )         Anesthesia Quick Evaluation  "

## 2024-06-26 NOTE — Op Note (Signed)
 [facility] Patient Name: Laurie Kelley Procedure Date: 06/26/2024 MRN: [id number] Attending MD: Sandor Flatter , MD, [id number] Date of Birth: July 02, 1970 CSN: [id number] Age: 54 Admit Type: Outpatient Procedure:                Upper GI endoscopy Indications:              Hereditary nonpolyposis colorectal cancer (Lynch                            Syndrome) Providers:                Sandor Flatter, MD, Jacquelyn Jaci Pierce, RN,                            Ozell Pouch, Corky Czech, Technician, Nena DOROTHA Dawn CRNA, CRNA Referring MD:              Medicines:                Monitored Anesthesia Care Complications:            No immediate complications. Estimated Blood Loss:     Estimated blood loss was minimal. Procedure:                Pre-Anesthesia Assessment:                           - Prior to the procedure, a History and Physical                            was performed, and patient medications and                            allergies were reviewed. The patient's tolerance of                            previous anesthesia was also reviewed. The risks                            and benefits of the procedure and the sedation                            options and risks were discussed with the patient.                            All questions were answered, and informed consent                            was obtained. Prior Anticoagulants: The patient has                            taken no anticoagulant or antiplatelet agents. ASA                            Grade Assessment:  II - A patient with mild systemic                            disease. After reviewing the risks and benefits,                            the patient was deemed in satisfactory condition to                            undergo the procedure.                           After obtaining informed consent, the endoscope was                            passed under  direct vision. Throughout the                            procedure, the patient's blood pressure, pulse, and                            oxygen saturations were monitored continuously. The                            [id number]-[id number] ([id number]) Olympus endoscope was introduced                            through the mouth, and advanced to the third part                            of duodenum. The upper GI endoscopy was                            accomplished without difficulty. The patient                            tolerated the procedure well. Scope In: Scope Out: Findings:      The examined esophagus was normal.      Scattered mild inflammation characterized by congestion (edema) and       erythema was found in the gastric body and in the gastric antrum.       Biopsies were taken with a cold forceps for Helicobacter pylori testing.       Estimated blood loss was minimal.      The examined duodenum was normal. Impression:               - Normal esophagus.                           - Gastritis. Biopsied.                           - Normal examined duodenum. Moderate Sedation:      Not Applicable - Patient had care per Anesthesia. Recommendation:           - Patient has a contact number available  for                            emergencies. The signs and symptoms of potential                            delayed complications were discussed with the                            patient. Return to normal activities tomorrow.                            Written discharge instructions were provided to the                            patient.                           - Resume previous diet.                           - Continue present medications.                           - Await pathology results.                           - Use Protonix  (pantoprazole ) 40 mg PO BID for 4                            weeks to promote mucosal healing of gastritis.                            After 4 weeks, can reduce to 40  mg daily x 2 weeks,                            then can discontinue if no further need for acid                            suppression medication long-term.                           - Perform a colonoscopy today. Procedure Code(s):        --- Professional ---                           [phone], Esophagogastroduodenoscopy, flexible,                            transoral; with biopsy, single or multiple Diagnosis Code(s):        --- Professional ---                           K29.70, Gastritis, unspecified, without bleeding  C18.9, Malignant neoplasm of colon, unspecified                           Z15.09, Genetic susceptibility to other malignant                            neoplasm CPT copyright 2022 American [employer]. All rights reserved. The codes documented in this report are preliminary and upon coder review may  be revised to meet current compliance requirements. Sandor Flatter, MD 06/26/2024 8:24:48 AM Number of Addenda: 0

## 2024-06-26 NOTE — Interval H&P Note (Signed)
 History and Physical Interval Note:  06/26/2024 7:25 AM  Laurie Kelley  has presented today for surgery, with the diagnosis of Lynch Syndrome.  The various methods of treatment have been discussed with the patient and family. After consideration of risks, benefits and other options for treatment, the patient has consented to  Procedures: EGD (ESOPHAGOGASTRODUODENOSCOPY) (N/A) COLONOSCOPY (N/A) as a surgical intervention.  The patient's history has been reviewed, patient examined, no change in status, stable for surgery.  I have reviewed the patient's chart and labs.  Questions were answered to the patient's satisfaction.     Sandor GAILS Geanette Buonocore

## 2024-06-26 NOTE — Anesthesia Procedure Notes (Signed)
 Procedure Name: MAC Date/Time: 06/26/2024 7:39 AM  Performed by: Dasie Nena PARAS, CRNAPre-anesthesia Checklist: Patient identified, Emergency Drugs available, Suction available, Patient being monitored and Timeout performed Oxygen Delivery Method: Nasal cannula Placement Confirmation: positive ETCO2

## 2024-06-26 NOTE — Transfer of Care (Signed)
 Immediate Anesthesia Transfer of Care Note  Patient: Laurie Kelley  Procedure(s) Performed: EGD (ESOPHAGOGASTRODUODENOSCOPY) COLONOSCOPY BIOPSY, SKIN, SUBCUTANEOUS TISSUE, OR MUCOUS MEMBRANE  Patient Location: PACU and Endoscopy Unit  Anesthesia Type:MAC  Level of Consciousness: awake, alert , oriented, and patient cooperative  Airway & Oxygen Therapy: Patient Spontanous Breathing and Patient connected to nasal cannula oxygen  Post-op Assessment: Report given to RN, Post -op Vital signs reviewed and stable, and BP 121//78.  Post vital signs: Reviewed and stable  Last Vitals:  Vitals Value Taken Time  BP    Temp    Pulse 77 06/26/24 08:21  Resp 20 06/26/24 08:21  SpO2 99 % 06/26/24 08:21  Vitals shown include unfiled device data.  Last Pain:  Vitals:   06/26/24 0709  TempSrc: Temporal  PainSc: 8          Complications: No notable events documented.

## 2024-06-26 NOTE — Op Note (Signed)
 [facility] Patient Name: Laurie Kelley Procedure Date: 06/26/2024 MRN: [id number] Attending MD: Sandor Flatter , MD, [id number] Date of Birth: April 08, 1971 CSN: [id number] Age: 54 Admit Type: Outpatient Procedure:                Colonoscopy Indications:              Colon cancer screening in patient at increased                            risk: Lynch Syndrome with PMS2 gene mutation. Providers:                Sandor Flatter, MD, Jacquelyn Jaci Pierce, RN,                            Corky Czech, Technician, Ozell Pouch, Nena DOROTHA Dawn CRNA, CRNA Referring MD:              Medicines:                Monitored Anesthesia Care Complications:            No immediate complications. Estimated Blood Loss:     Estimated blood loss: none. Procedure:                Pre-Anesthesia Assessment:                           - Prior to the procedure, a History and Physical                            was performed, and patient medications and                            allergies were reviewed. The patient's tolerance of                            previous anesthesia was also reviewed. The risks                            and benefits of the procedure and the sedation                            options and risks were discussed with the patient.                            All questions were answered, and informed consent                            was obtained. Prior Anticoagulants: The patient has                            taken no anticoagulant or antiplatelet agents. ASA  Grade Assessment: II - A patient with mild systemic                            disease. After reviewing the risks and benefits,                            the patient was deemed in satisfactory condition to                            undergo the procedure.                           After obtaining informed consent, the colonoscope                             was passed under direct vision. Throughout the                            procedure, the patient's blood pressure, pulse, and                            oxygen saturations were monitored continuously. The                            [id number]-[id number] ([id number]) Olympus colonoscope was                            introduced through the anus and advanced to the the                            terminal ileum. The colonoscopy was performed                            without difficulty. The patient tolerated the                            procedure well. The quality of the bowel                            preparation was good. The terminal ileum, ileocecal                            valve, appendiceal orifice, and rectum were                            photographed. Scope In: 7:52:40 AM Scope Out: 8:14:50 AM Scope Withdrawal Time: 0 hours 11 minutes 26 seconds  Total Procedure Duration: 0 hours 22 minutes 10 seconds  Findings:      The perianal and digital rectal examinations were normal.      A few small-mouthed diverticula were found in the sigmoid colon and       distal descending colon.      The exam was otherwise normal throughout the remainder of the colon.      The retroflexed view of the distal rectum  and anal verge was normal and       showed no anal or rectal abnormalities.      The terminal ileum appeared normal. Impression:               - Diverticulosis in the sigmoid colon and in the                            distal descending colon.                           - The distal rectum and anal verge are normal on                            retroflexion view.                           - The examined portion of the ileum was normal.                           - No specimens collected. Moderate Sedation:      Not Applicable - Patient had care per Anesthesia. Recommendation:           - Patient has a contact number available for                            emergencies. The signs and symptoms of  potential                            delayed complications were discussed with the                            patient. Return to normal activities tomorrow.                            Written discharge instructions were provided to the                            patient.                           - Resume previous diet.                           - Repeat colonoscopy in 2 years for surveillance.                           - Return to GI office in 1 year, or sooner as                            needed. Procedure Code(s):        --- Professional ---                           H9894, Colorectal cancer screening; colonoscopy on  individual at high risk Diagnosis Code(s):        --- Professional ---                           Z80.0, Family history of malignant neoplasm of                            digestive organs                           K57.30, Diverticulosis of large intestine without                            perforation or abscess without bleeding CPT copyright 2022 [employer] Association. All rights reserved. The codes documented in this report are preliminary and upon coder review may  be revised to meet current compliance requirements. Sandor Flatter, MD 06/26/2024 8:36:18 AM Number of Addenda: 0

## 2024-06-27 ENCOUNTER — Ambulatory Visit: Admitting: Urology

## 2024-06-27 ENCOUNTER — Encounter (HOSPITAL_COMMUNITY): Payer: Self-pay | Admitting: Gastroenterology

## 2024-06-27 ENCOUNTER — Ambulatory Visit (HOSPITAL_COMMUNITY)
Admission: RE | Admit: 2024-06-27 | Discharge: 2024-06-27 | Disposition: A | Source: Ambulatory Visit | Attending: Urology

## 2024-06-27 ENCOUNTER — Ambulatory Visit: Payer: Self-pay | Admitting: Gastroenterology

## 2024-06-27 VITALS — BP 119/68 | HR 71

## 2024-06-27 DIAGNOSIS — N200 Calculus of kidney: Secondary | ICD-10-CM | POA: Diagnosis not present

## 2024-06-27 LAB — URINALYSIS, ROUTINE W REFLEX MICROSCOPIC
Bilirubin, UA: NEGATIVE
Glucose, UA: NEGATIVE
Ketones, UA: NEGATIVE
Nitrite, UA: NEGATIVE
Protein,UA: NEGATIVE
RBC, UA: NEGATIVE
Specific Gravity, UA: 1.025 (ref 1.005–1.030)
Urobilinogen, Ur: 0.2 mg/dL (ref 0.2–1.0)
pH, UA: 6 (ref 5.0–7.5)

## 2024-06-27 LAB — MICROSCOPIC EXAMINATION: Epithelial Cells (non renal): >10 /HPF — AB (ref 0–10)

## 2024-06-27 LAB — SURGICAL PATHOLOGY

## 2024-06-27 MED ORDER — CYCLOBENZAPRINE HCL 5 MG PO TABS: 5.0000 mg | ORAL_TABLET | Freq: Three times a day (TID) | ORAL | 1 refills | Status: AC | PRN

## 2024-06-27 NOTE — Progress Notes (Unsigned)
 "  06/27/2024 12:24 PM   Gita L May 01-18-1971 [id number]  Referring provider: [name], DO [address] [place],  [place] [address]  No chief complaint on file.   HPI: Ms Chandran is a 54yo here for followup for nephrolithiasis. No stone events since last visit. No stone events since last visit. KUB shows no definitive calculi. She is having intermittent left back pain which is worse with bending and standing.    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   Lynch syndrome    PONV (postoperative nausea and vomiting)     Surgical History: Past Surgical History:  Procedure Laterality Date   ABDOMINAL HYSTERECTOMY N/A 03/08/2022   Procedure: HYSTERECTOMY ABDOMINAL;  Surgeon: Ozan, Jennifer, DO;  Location: [facility];  Service: Gynecology;  Laterality: N/A;   ADJACENT TISSUE TRANSFER/TISSUE REARRANGEMENT Right 11/22/2022   Procedure: Fat grafting to right  temple from right buttocks after brain surgery;  Surgeon: Lowery Estefana RAMAN, DO;  Location: [facility];  Service: Plastics;  Laterality: Right;   APPLICATION OF CRANIAL NAVIGATION Left 02/18/2021   Procedure: APPLICATION OF CRANIAL NAVIGATION;  Surgeon: Cheryle Debby LABOR, MD;  Location: [facility];  Service: Neurosurgery;  Laterality: Left;   APPLICATION OF CRANIAL NAVIGATION N/A 05/18/2021   Procedure: APPLICATION OF CRANIAL NAVIGATION;  Surgeon: Cheryle Debby LABOR, MD;  Location: [facility];  Service: Neurosurgery;  Laterality: N/A;   BIOPSY  06/28/2022   Procedure:  BIOPSY;  Surgeon: San Sandor GAILS, DO;  Location: [facility];  Service: Gastroenterology;;   BIOPSY OF SKIN SUBCUTANEOUS TISSUE AND/OR MUCOUS MEMBRANE  06/26/2024   Procedure: BIOPSY, SKIN, SUBCUTANEOUS TISSUE, OR MUCOUS MEMBRANE;  Surgeon: San Sandor GAILS, DO;  Location: [facility];  Service: Gastroenterology;;   BRAIN SURGERY     CHOLECYSTECTOMY  1993   COLONOSCOPY N/A 06/26/2024   Procedure: COLONOSCOPY;  Surgeon: San Sandor GAILS, DO;  Location: [facility];  Service: Gastroenterology;  Laterality: N/A;   COLONOSCOPY WITH PROPOFOL  N/A 06/28/2022   Procedure: COLONOSCOPY WITH PROPOFOL ;  Surgeon: San Sandor GAILS, DO;  Location: [facility];  Service: Gastroenterology;  Laterality: N/A;   CRANIOTOMY Left 02/18/2021   Procedure: Left orbitozygomatic craniotomy for tumor resection  with Brain Lab;  Surgeon: Cheryle Debby LABOR, MD;  [facility];  Service: Neurosurgery;  Laterality: Left;   CRANIOTOMY Right 05/18/2021   Procedure: Right Orbitozygomatic craniotomy for tumor with brainlab;  Surgeon: Cheryle Debby LABOR, MD;  Location: [facility];  Service: Neurosurgery;  Laterality: Right;   ESOPHAGOGASTRODUODENOSCOPY N/A 06/26/2024   Procedure: EGD (ESOPHAGOGASTRODUODENOSCOPY);  Surgeon: San Sandor GAILS, DO;  Location: [facility];  Service: Gastroenterology;  Laterality: N/A;   ESOPHAGOGASTRODUODENOSCOPY (EGD) WITH PROPOFOL  N/A 06/28/2022   Procedure: ESOPHAGOGASTRODUODENOSCOPY (EGD) WITH PROPOFOL ;  Surgeon: San Sandor GAILS, DO;  Location: [facility];  Service: Gastroenterology;  Laterality: N/A;   EXTRACORPOREAL SHOCK WAVE LITHOTRIPSY Right 12/04/2023   Procedure: LITHOTRIPSY, ESWL;  Surgeon: Sherrilee Belvie CROME, MD;  Location: [facility];  Service: Urology;  Laterality: Right;   EXTRACORPOREAL SHOCK WAVE LITHOTRIPSY Left 01/01/2024   Procedure: LITHOTRIPSY, ESWL;  Surgeon: Sherrilee Belvie CROME, MD;  Location: [facility];  Service: Urology;  Laterality: Left;   PARATHYROIDECTOMY Right  04/23/2023   Procedure: RIGHT INFERIOR PARATHYROIDECTOMY;  Surgeon: Eletha Boas, MD;  Location: [facility];  Service: General;  Laterality: Right;   POLYPECTOMY  06/28/2022   Procedure: POLYPECTOMY;  Surgeon: San Sandor GAILS, DO;  Location: [facility];  Service: Gastroenterology;;   SALPINGOOPHORECTOMY Bilateral 03/08/2022   Procedure: OPEN SALPINGO OOPHORECTOMY;  Surgeon: Marilynn Nest, DO;  Location: [facility];  Service: Gynecology;  Laterality: Bilateral;   SHOULDER ARTHROSCOPY Right 11/06/2019   Procedure: ARTHROSCOPY RIGHT SHOULDER, DEBRIDEMENT X TWO STRUCTURES; OPEN ROTATOR CUFF REPAIR;  Surgeon: Margrette Taft BRAVO, MD;  Location: [facility];  Service: Orthopedics;  Laterality: Right;    Home Medications:  Allergies as of 06/27/2024       Reactions   Gadolinium Derivatives Other (See Comments)   Pt reports she developed chest tightness, burning of her back, and weakness after MRI contrast 11/2021. Per Dr. Shoshana, he suggests 13 hr protocol prior to MRI.    Iodinated Contrast Media Nausea And Vomiting   Saline Nausea And Vomiting   Patient states she has nausea and vomiting if she has it for a long time, she did not have any N/V after her last ESL procedure   Sodium Chloride  Nausea And Vomiting        Medication List        Accurate as of June 27, 2024 12:24 PM. If you have any questions, ask your nurse or doctor.          atorvastatin  20 MG tablet Commonly known as: LIPITOR Take 1 tablet (20 mg total) by mouth daily.   colestipol  1 g tablet Commonly known as: COLESTID  Take 2 tablets (2 g total) by mouth daily.   DULoxetine  60 MG capsule Commonly known as: Cymbalta  Take 1 capsule (60 mg total) by mouth daily.   gabapentin  600 MG tablet Commonly known as: NEURONTIN  Take 1 tablet (600 mg total) by mouth 3 (three) times daily.   hydrocortisone  1 % ointment Apply 1 Application topically 2 (two) times daily as needed for itching.   hydrocortisone  2.5 % rectal  cream Commonly known as: ANUSOL -HC Place 1 Application rectally 2 (two) times daily.   meloxicam  7.5 MG tablet Commonly known as: Mobic  Take 1 tablet (7.5 mg total) by mouth daily.   pantoprazole  40 MG tablet Commonly known as: Protonix  Take 1 tablet (40 mg total) by mouth 2 (two) times daily. Take 1 tablet p.o. twice daily for 4 weeks, then reduce to 1 tablet daily.  Take 30-60 minutes prior to mealtime.   PROBIOTIC DAILY PO Take 1 tablet by mouth daily. Olly        Allergies: Allergies[1]  Family History: Family History  Problem Relation Age of Onset   Cancer Mother    Hyperlipidemia Mother    Lung cancer Mother    Prostate cancer Father    Hypertension Father    Heart disease Father    Other Sister        meningiomas/tumors x11   Brain cancer Maternal Aunt        d. 73s-60s   Lung cancer Maternal Aunt    Bone cancer Maternal Aunt        d. 58s-60s   Stomach cancer Paternal Uncle    Liver cancer Paternal  Uncle    Lung cancer Paternal Grandmother        d.85   Other Daughter        Lynch syndrome   Rectal cancer Neg Hx    Esophageal cancer Neg Hx    Breast cancer Neg Hx    Colon polyps Neg Hx    Colon cancer Neg Hx    Pancreatic cancer Neg Hx     Social History:  reports that she has never smoked. She has never used smokeless tobacco. She reports that she does not drink alcohol and does not use drugs.  ROS: All other review of systems were reviewed and are negative except what is noted above in HPI  Physical Exam: BP 119/68   Pulse 71   Constitutional:  Alert and oriented, No acute distress. HEENT: [place] AT, moist mucus membranes.  Trachea midline, no masses. Cardiovascular: No clubbing, cyanosis, or edema. Respiratory: Normal respiratory effort, no increased work of breathing. GI: Abdomen is soft, nontender, nondistended, no abdominal masses GU: No CVA tenderness.  Lymph: No cervical or inguinal lymphadenopathy. Skin: No rashes, bruises or suspicious  lesions. Neurologic: Grossly intact, no focal deficits, moving all 4 extremities. Psychiatric: Normal mood and affect.  Laboratory Data: Lab Results  Component Value Date   WBC 4.9 03/26/2024   HGB 14.3 03/26/2024   HCT 44.6 03/26/2024   MCV 90 03/26/2024   PLT 290 03/26/2024    Lab Results  Component Value Date   CREATININE 1.08 (H) 03/26/2024    No results found for: PSA  No results found for: TESTOSTERONE  Lab Results  Component Value Date   HGBA1C 5.3 02/27/2023    Urinalysis    Component Value Date/Time   COLORURINE STRAW (A) 11/25/2023 2146   APPEARANCEUR Clear 03/26/2024 1349   LABSPEC 1.013 11/25/2023 2146   PHURINE 7.0 11/25/2023 2146   GLUCOSEU Negative 03/26/2024 1349   HGBUR MODERATE (A) 11/25/2023 2146   BILIRUBINUR Comment (A) 03/26/2024 1349   KETONESUR NEGATIVE 11/25/2023 2146   PROTEINUR 1+ (A) 03/26/2024 1349   PROTEINUR NEGATIVE 11/25/2023 2146   UROBILINOGEN negative 08/18/2015 1234   NITRITE Negative 03/26/2024 1349   NITRITE NEGATIVE 11/25/2023 2146   LEUKOCYTESUR Trace (A) 03/26/2024 1349   LEUKOCYTESUR MODERATE (A) 11/25/2023 2146    Lab Results  Component Value Date   LABMICR See below: 03/26/2024   WBCUA 0-5 03/26/2024   RBCUA 3-10 (A) 04/11/2018   LABEPIT 0-10 03/26/2024   MUCUS None seen 03/26/2024   BACTERIA Few (A) 03/26/2024    Pertinent Imaging: *** Results for orders placed during the hospital encounter of 02/13/24  Abdomen 1 view (KUB)  Narrative EXAM: 1 VIEW XRAY OF THE ABDOMEN 02/13/2024 09:12:00 AM  COMPARISON: 01/16/2024  CLINICAL HISTORY: Nephrolithiasis. Hx kidney stones bilaterally with previous bilateral lithotripsy.  FINDINGS:  BOWEL: Nonobstructive bowel gas pattern.  SOFT TISSUES: No opaque urinary calculi. Multiple pelvic phleboliths present, unchanged.  BONES: No acute osseous abnormality. Cholecystectomy clips noted. Previously described left renal calculi not well  visualized.  IMPRESSION: 1. Previously described left renal calculi not well visualized.  Electronically signed by: Norman Gatlin MD 02/24/2024 03:23 AM EDT RP Workstation: [id number]  No results found for this or any previous visit.  No results found for this or any previous visit.  No results found for this or any previous visit.  No results found for this or any previous visit.  No results found for this or any previous visit.  No results found for this  or any previous visit.  Results for orders placed during the hospital encounter of 11/22/23  CT RENAL STONE STUDY  Narrative CLINICAL DATA:  Left flank pain, history of kidney stones  EXAM: CT ABDOMEN AND PELVIS WITHOUT CONTRAST  TECHNIQUE: Multidetector CT imaging of the abdomen and pelvis was performed following the standard protocol without IV contrast.  RADIATION DOSE REDUCTION: This exam was performed according to the departmental dose-optimization program which includes automated exposure control, adjustment of the mA and/or kV according to patient size and/or use of iterative reconstruction technique.  COMPARISON:  Nov 04, 2023  FINDINGS: Lower chest: No acute abnormality.  Hepatobiliary: No focal liver abnormality is seen. Status post cholecystectomy. No biliary dilatation.  Pancreas: Unremarkable. No pancreatic ductal dilatation or surrounding inflammatory changes.  Spleen: Normal in size without focal abnormality.  Adrenals/Urinary Tract: Adrenal glands are normal.  Kidneys are normal size. Comparison with prior examinations demonstrates no significant change in the bilateral nephrolithiasis with a 9 mm stone in the right renal pelvis without hydronephrosis. Several calcifications in the lower pole calices of the left kidney consistent with loosely formed staghorn calculus unchanged since prior examination. No ureteral stones no hydronephrosis no bladder stones  Stomach/Bowel: Stomach is  within normal limits. Appendix appears normal. No evidence of bowel wall thickening, distention, or inflammatory changes.  Vascular/Lymphatic: No significant vascular findings are present. No enlarged abdominal or pelvic lymph nodes.  Reproductive: Uterus and bilateral adnexa are unremarkable.  Other: No abdominal wall hernia or abnormality. No abdominopelvic ascites.  Musculoskeletal: No acute or significant osseous findings.  IMPRESSION: *No acute abnormality of the abdomen or pelvis. *Bilateral nephrolithiasis without hydronephrosis. *Status post cholecystectomy.   Electronically Signed By: Franky Chard M.D. On: 11/22/2023 12:42   Assessment & Plan:    1. Kidney stones (Primary) *** - Urinalysis, Routine w reflex microscopic   No follow-ups on file.  [name], MD  [facility]      [1]  Allergies Allergen Reactions   Gadolinium Derivatives Other (See Comments)    Pt reports she developed chest tightness, burning of her back, and weakness after MRI contrast 11/2021. Per Dr. Shoshana, he suggests 13 hr protocol prior to MRI.    Iodinated Contrast Media Nausea And Vomiting   Saline Nausea And Vomiting    Patient states she has nausea and vomiting if she has it for a long time, she did not have any N/V after her last ESL procedure   Sodium Chloride  Nausea And Vomiting   "

## 2024-06-29 NOTE — Anesthesia Postprocedure Evaluation (Signed)
"   Anesthesia Post Note  Patient: Laurie Kelley  Procedure(s) Performed: EGD (ESOPHAGOGASTRODUODENOSCOPY) COLONOSCOPY BIOPSY, SKIN, SUBCUTANEOUS TISSUE, OR MUCOUS MEMBRANE     Patient location during evaluation: Endoscopy Anesthesia Type: MAC Level of consciousness: awake Pain management: pain level controlled Vital Signs Assessment: post-procedure vital signs reviewed and stable Respiratory status: spontaneous breathing Cardiovascular status: blood pressure returned to baseline Postop Assessment: no apparent nausea or vomiting Anesthetic complications: no   No notable events documented.  Last Vitals:  Vitals:   06/26/24 0840 06/26/24 0850  BP: (!) 107/51 (!) 101/50  Pulse: (!) 59 (!) 59  Resp: 16 16  Temp:    SpO2: 100% 98%    Last Pain:  Vitals:   06/26/24 0850  TempSrc:   PainSc: 0-No pain                 Trinadee Verhagen T Colhoun      "

## 2024-07-01 ENCOUNTER — Ambulatory Visit: Admitting: Gastroenterology

## 2024-07-01 ENCOUNTER — Encounter: Payer: Self-pay | Admitting: Urology

## 2024-07-01 NOTE — Patient Instructions (Signed)

## 2024-07-04 ENCOUNTER — Encounter: Payer: Self-pay | Admitting: Family Medicine

## 2024-07-11 ENCOUNTER — Ambulatory Visit

## 2024-07-17 ENCOUNTER — Ambulatory Visit (INDEPENDENT_AMBULATORY_CARE_PROVIDER_SITE_OTHER)

## 2024-07-17 DIAGNOSIS — Z23 Encounter for immunization: Secondary | ICD-10-CM

## 2024-07-17 DIAGNOSIS — Z2839 Other underimmunization status: Secondary | ICD-10-CM

## 2024-07-17 NOTE — Progress Notes (Signed)
 Patient presented to office for Hep B injection. Patient tolerated well

## 2024-09-26 ENCOUNTER — Ambulatory Visit: Payer: Self-pay | Admitting: Family Medicine

## 2025-01-16 ENCOUNTER — Ambulatory Visit: Admitting: Urology

## 2025-03-31 ENCOUNTER — Encounter: Payer: Self-pay | Admitting: Family Medicine
